# Patient Record
Sex: Male | Born: 1945
Health system: Southern US, Community
[De-identification: ages and names within clinical notes are randomized; demographics above are authoritative.]

## PROBLEM LIST (undated history)

## (undated) DIAGNOSIS — J439 Emphysema, unspecified: Secondary | ICD-10-CM

## (undated) DIAGNOSIS — R0683 Snoring: Secondary | ICD-10-CM

## (undated) DIAGNOSIS — J849 Interstitial pulmonary disease, unspecified: Secondary | ICD-10-CM

## (undated) DIAGNOSIS — E785 Hyperlipidemia, unspecified: Secondary | ICD-10-CM

## (undated) DIAGNOSIS — B269 Mumps without complication: Secondary | ICD-10-CM

## (undated) DIAGNOSIS — K635 Polyp of colon: Secondary | ICD-10-CM

## (undated) DIAGNOSIS — B019 Varicella without complication: Secondary | ICD-10-CM

## (undated) DIAGNOSIS — T7840XA Allergy, unspecified, initial encounter: Secondary | ICD-10-CM

## (undated) DIAGNOSIS — G4733 Obstructive sleep apnea (adult) (pediatric): Secondary | ICD-10-CM

## (undated) DIAGNOSIS — H269 Unspecified cataract: Secondary | ICD-10-CM

## (undated) DIAGNOSIS — R252 Cramp and spasm: Secondary | ICD-10-CM

## (undated) DIAGNOSIS — I10 Essential (primary) hypertension: Secondary | ICD-10-CM

## (undated) DIAGNOSIS — B059 Measles without complication: Secondary | ICD-10-CM

## (undated) DIAGNOSIS — F419 Anxiety disorder, unspecified: Secondary | ICD-10-CM

## (undated) DIAGNOSIS — J189 Pneumonia, unspecified organism: Secondary | ICD-10-CM

## (undated) DIAGNOSIS — J8410 Pulmonary fibrosis, unspecified: Secondary | ICD-10-CM

## (undated) DIAGNOSIS — K2270 Barrett's esophagus without dysplasia: Secondary | ICD-10-CM

## (undated) DIAGNOSIS — K5790 Diverticulosis of intestine, part unspecified, without perforation or abscess without bleeding: Secondary | ICD-10-CM

## (undated) DIAGNOSIS — K649 Unspecified hemorrhoids: Secondary | ICD-10-CM

## (undated) DIAGNOSIS — M1990 Unspecified osteoarthritis, unspecified site: Secondary | ICD-10-CM

## (undated) DIAGNOSIS — G4700 Insomnia, unspecified: Secondary | ICD-10-CM

## (undated) DIAGNOSIS — K219 Gastro-esophageal reflux disease without esophagitis: Secondary | ICD-10-CM

## (undated) DIAGNOSIS — G4730 Sleep apnea, unspecified: Secondary | ICD-10-CM

## (undated) DIAGNOSIS — J310 Chronic rhinitis: Secondary | ICD-10-CM

## (undated) HISTORY — DX: Essential (primary) hypertension: I10

## (undated) HISTORY — DX: Unspecified cataract: H26.9

## (undated) HISTORY — DX: Pneumonia, unspecified organism: J18.9

## (undated) HISTORY — DX: Hyperlipidemia, unspecified: E78.5

## (undated) HISTORY — DX: Gastro-esophageal reflux disease without esophagitis: K21.9

## (undated) HISTORY — DX: Diverticulosis of intestine, part unspecified, without perforation or abscess without bleeding: K57.90

## (undated) HISTORY — PX: JOINT REPLACEMENT: SHX530

## (undated) HISTORY — PX: TONSILLECTOMY: SUR1361

## (undated) HISTORY — DX: Anxiety disorder, unspecified: F41.9

## (undated) HISTORY — DX: Chronic rhinitis: J31.0

## (undated) HISTORY — DX: Pulmonary fibrosis, unspecified: J84.10

## (undated) HISTORY — DX: Unspecified osteoarthritis, unspecified site: M19.90

## (undated) HISTORY — DX: Sleep apnea, unspecified: G47.30

## (undated) HISTORY — DX: Mumps without complication: B26.9

## (undated) HISTORY — PX: COLONOSCOPY: SHX174

## (undated) HISTORY — DX: Barrett's esophagus without dysplasia: K22.70

## (undated) HISTORY — DX: Measles without complication: B05.9

## (undated) HISTORY — DX: Polyp of colon: K63.5

## (undated) HISTORY — DX: Obstructive sleep apnea (adult) (pediatric): G47.33

## (undated) HISTORY — DX: Varicella without complication: B01.9

## (undated) HISTORY — PX: CATARACT EXTRACTION: SUR2

## (undated) HISTORY — DX: Snoring: R06.83

## (undated) HISTORY — DX: Allergy, unspecified, initial encounter: T78.40XA

## (undated) HISTORY — DX: Cramp and spasm: R25.2

## (undated) HISTORY — PX: UPPER GASTROINTESTINAL ENDOSCOPY: SHX188

## (undated) HISTORY — DX: Unspecified hemorrhoids: K64.9

---
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
## 2000-09-28 ENCOUNTER — Emergency Department (HOSPITAL_COMMUNITY): Admission: EM | Admit: 2000-09-28 | Discharge: 2000-09-28 | Payer: Self-pay | Admitting: Emergency Medicine

## 2004-08-28 ENCOUNTER — Ambulatory Visit: Payer: Self-pay | Admitting: Gastroenterology

## 2004-09-12 ENCOUNTER — Ambulatory Visit: Payer: Self-pay | Admitting: Gastroenterology

## 2005-05-30 ENCOUNTER — Ambulatory Visit: Payer: Self-pay | Admitting: Cardiology

## 2005-06-17 ENCOUNTER — Ambulatory Visit: Payer: Self-pay

## 2005-06-17 ENCOUNTER — Ambulatory Visit: Payer: Self-pay | Admitting: Cardiology

## 2005-06-24 ENCOUNTER — Ambulatory Visit: Payer: Self-pay | Admitting: Cardiology

## 2005-08-28 ENCOUNTER — Ambulatory Visit: Payer: Self-pay | Admitting: Cardiology

## 2009-08-15 ENCOUNTER — Encounter (INDEPENDENT_AMBULATORY_CARE_PROVIDER_SITE_OTHER): Payer: Self-pay | Admitting: *Deleted

## 2010-04-17 ENCOUNTER — Telehealth: Payer: Self-pay | Admitting: Gastroenterology

## 2010-10-03 NOTE — Progress Notes (Signed)
Summary: Schedule Colonoscopy   Phone Note Outgoing Call Call back at Home Phone [phone]   Call placed by: Harlow Mares CMA [facility]),  April 17, 2010 11:44 AM Call placed to: Patients wife Summary of Call: spoke to the patients wife she states that the patient is sick now and is also working out of town and she would like me to mail her the recall letter again and they would call back to schedule the colonoscopy when he got back in town.  Initial call taken by: Harlow Mares CMA [facility]),  April 17, 2010 11:44 AM

## 2011-09-07 ENCOUNTER — Ambulatory Visit (INDEPENDENT_AMBULATORY_CARE_PROVIDER_SITE_OTHER): Payer: BC Managed Care – PPO

## 2011-09-07 DIAGNOSIS — I10 Essential (primary) hypertension: Secondary | ICD-10-CM

## 2011-09-07 DIAGNOSIS — E785 Hyperlipidemia, unspecified: Secondary | ICD-10-CM

## 2011-10-28 ENCOUNTER — Ambulatory Visit (INDEPENDENT_AMBULATORY_CARE_PROVIDER_SITE_OTHER): Payer: BC Managed Care – PPO | Admitting: Family Medicine

## 2011-10-28 VITALS — BP 140/82 | HR 88 | Temp 98.3°F | Resp 16 | Ht 71.5 in | Wt 277.8 lb

## 2011-10-28 DIAGNOSIS — J40 Bronchitis, not specified as acute or chronic: Secondary | ICD-10-CM

## 2011-10-28 DIAGNOSIS — H109 Unspecified conjunctivitis: Secondary | ICD-10-CM

## 2011-10-28 MED ORDER — HYDROCODONE-HOMATROPINE 5-1.5 MG/5ML PO SYRP: 5.0000 mL | ORAL_SOLUTION | Freq: Four times a day (QID) | ORAL | Status: AC | PRN

## 2011-10-28 MED ORDER — TOBRAMYCIN 0.3 % OP SOLN: 1.0000 [drp] | Freq: Four times a day (QID) | OPHTHALMIC | Status: AC

## 2011-10-28 MED ORDER — LEVOFLOXACIN 500 MG PO TABS: 500.0000 mg | ORAL_TABLET | Freq: Every day | ORAL | Status: AC

## 2011-10-28 NOTE — Progress Notes (Signed)
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 Of greater concern to him is the fact that his eyes become red and matted over the last week. He's had chills this morning and he generally feels horrible with mild myalgias.  Objective: Patient appears chronically ill, overweight, bloodshot eyes  Eye exam: Normal fundi injected sclera, swollen lids at margins.  HEENT: Slightly bulging TMs, red pharynx, swollen nasal passages  Chest: Bilateral expiratory wheezes faint  Checked her heart: Regular no murmur  Assessment: Significant bronchitis with conjunctivitis, acute illness which is getting worse  Plan: Tobrex, Levaquin, Hydromet... consider bronchodilator if symptoms do not improve

## 2012-01-05 ENCOUNTER — Ambulatory Visit (INDEPENDENT_AMBULATORY_CARE_PROVIDER_SITE_OTHER): Payer: BC Managed Care – PPO | Admitting: Family Medicine

## 2012-01-05 VITALS — BP 131/77 | HR 83 | Temp 98.5°F | Resp 16 | Ht 71.25 in | Wt 278.8 lb

## 2012-01-05 DIAGNOSIS — R6889 Other general symptoms and signs: Secondary | ICD-10-CM

## 2012-01-05 DIAGNOSIS — J111 Influenza due to unidentified influenza virus with other respiratory manifestations: Secondary | ICD-10-CM

## 2012-01-05 DIAGNOSIS — J329 Chronic sinusitis, unspecified: Secondary | ICD-10-CM

## 2012-01-05 DIAGNOSIS — J310 Chronic rhinitis: Secondary | ICD-10-CM

## 2012-01-05 LAB — POCT INFLUENZA A/B
Influenza A, POC: NEGATIVE
Influenza B, POC: NEGATIVE

## 2012-01-05 MED ORDER — FLUTICASONE PROPIONATE 50 MCG/ACT NA SUSP: 2.0000 | Freq: Every day | NASAL | Status: DC

## 2012-01-05 MED ORDER — AZITHROMYCIN 250 MG PO TABS: ORAL_TABLET | ORAL | Status: AC

## 2012-01-05 NOTE — Progress Notes (Signed)
[facility]:  Office Visit  Chief Complaint:  Chief Complaint  Patient presents with  . Fatigue    2 days  . Cough  . Sinusitis  . Headache    HPI: Gregg Smith is a 66 y.o. male who complains of  2 day history of msk pain/aches, feels like "truck ran over him". Sinus congestion, headaches, cough (clear). Has not tried anything OTC except Tylenol. Denies allergies and asthma. Denies  fevers, chills,  Past Medical History  Diagnosis Date  . Hypertension   . Hyperlipidemia   . Arthritis    Past Surgical History  Procedure Date  . Appendectomy    History   Social History  . Marital Status: Married    Spouse Name: N/A    Number of Children: N/A  . Years of Education: N/A   Social History Main Topics  . Smoking status: Never Smoker   . Smokeless tobacco: None  . Alcohol Use: Yes     2 beers weekly  . Drug Use: No  . Sexually Active: Yes   Other Topics Concern  . None   Social History Narrative  . None   Family History  Problem Relation Age of Onset  . Alzheimer's disease Mother   . Cancer Father   . Aortic aneurysm Son    Allergies  Allergen Reactions  . Penicillins Hives and Rash   Prior to Admission medications   Medication Sig Start Date End Date Taking? Authorizing Provider  lisinopril (PRINIVIL,ZESTRIL) 5 MG tablet Take 5 mg by mouth daily.   Yes Historical Provider, MD  rosuvastatin (CRESTOR) 10 MG tablet Take 10 mg by mouth daily.   Yes Historical Provider, MD     ROS: The patient denies night sweats, unintentional weight loss, chest pain, palpitations, wheezing, dyspnea on exertion, nausea, vomiting, abdominal pain, dysuria, hematuria, melena, numbness, weakness, or tingling.   All other systems have been reviewed and were otherwise negative with the exception of those mentioned in the HPI and as above.    PHYSICAL EXAM: Filed Vitals:   01/05/12 0947  BP: 131/77  Pulse: 83  Temp: 98.5 F (36.9 C)  Resp: 16    Filed Vitals:   01/05/12 0947  Height: 5' 11.25" (1.81 m)  Weight: 278 lb 12.8 oz (126.463 kg)   Body mass index is 38.61 kg/(m^2).  General: Alert, no acute distress, obese, sweating HEENT:  Normocephalic, atraumatic, oropharynx patent. TM NL, OP CLEAR, throat red but no tonsills, no sinus tenderness Cardiovascular:  Regular rate and rhythm, no rubs murmurs or gallops.  No Carotid bruits, radial pulse intact. No pedal edema.  Respiratory: Clear to auscultation bilaterally.  No wheezes, rales, or rhonchi.  No cyanosis, no use of accessory musculature GI: No organomegaly, abdomen is soft and non-tender, positive bowel sounds.  No masses. Skin: No rashes. Neurologic: Facial musculature symmetric. Psychiatric: Patient is appropriate throughout our interaction. Lymphatic: No cervical lymphadenopathy Musculoskeletal: Gait intact.   LABS: Results for orders placed in visit on 01/05/12  POCT INFLUENZA A/B      Component Value Range   Influenza A, POC Negative     Influenza B, POC Negative       EKG/XRAY:   Primary read interpreted by Dr. Conley Rolls at [facility].   ASSESSMENT/PLAN: Encounter Diagnoses  Name Primary?  . Flu-like symptoms Yes  . Sinusitis    Most likely viral URI, OTC meds for sxs management Sxs treatment  Gave pt a rx for Azithromycin if no  improvement then may take      [name] PHUONG, DO 01/05/2012 11:01 AM

## 2012-04-14 ENCOUNTER — Ambulatory Visit (INDEPENDENT_AMBULATORY_CARE_PROVIDER_SITE_OTHER): Payer: BC Managed Care – PPO | Admitting: Physician Assistant

## 2012-04-14 ENCOUNTER — Encounter: Payer: Self-pay | Admitting: Physician Assistant

## 2012-04-14 VITALS — BP 134/80 | HR 75 | Temp 98.0°F | Resp 18 | Ht 71.5 in | Wt 275.0 lb

## 2012-04-14 DIAGNOSIS — I10 Essential (primary) hypertension: Secondary | ICD-10-CM

## 2012-04-14 DIAGNOSIS — E785 Hyperlipidemia, unspecified: Secondary | ICD-10-CM

## 2012-04-14 DIAGNOSIS — J310 Chronic rhinitis: Secondary | ICD-10-CM

## 2012-04-14 DIAGNOSIS — R5381 Other malaise: Secondary | ICD-10-CM

## 2012-04-14 DIAGNOSIS — Z23 Encounter for immunization: Secondary | ICD-10-CM

## 2012-04-14 DIAGNOSIS — Z1211 Encounter for screening for malignant neoplasm of colon: Secondary | ICD-10-CM

## 2012-04-14 DIAGNOSIS — R5383 Other fatigue: Secondary | ICD-10-CM

## 2012-04-14 DIAGNOSIS — Z0000 Encounter for general adult medical examination without abnormal findings: Secondary | ICD-10-CM

## 2012-04-14 DIAGNOSIS — Z125 Encounter for screening for malignant neoplasm of prostate: Secondary | ICD-10-CM

## 2012-04-14 LAB — CBC WITH DIFFERENTIAL/PLATELET
Eosinophils Absolute: 0.2 10*3/uL (ref 0.0–0.7)
Eosinophils Relative: 3 % (ref 0–5)
Hemoglobin: 15.5 g/dL (ref 13.0–17.0)
Lymphs Abs: 1.4 10*3/uL (ref 0.7–4.0)
MCH: 28.8 pg (ref 26.0–34.0)
MCV: 85.7 fL (ref 78.0–100.0)
Monocytes Relative: 13 % — ABNORMAL HIGH (ref 3–12)
Platelets: 196 10*3/uL (ref 150–400)
RBC: 5.39 MIL/uL (ref 4.22–5.81)

## 2012-04-14 LAB — POCT URINALYSIS DIPSTICK
Blood, UA: NEGATIVE
Nitrite, UA: NEGATIVE
pH, UA: 8.5

## 2012-04-14 LAB — COMPREHENSIVE METABOLIC PANEL
CO2: 28 mEq/L (ref 19–32)
Creat: 0.94 mg/dL (ref 0.50–1.35)
Glucose, Bld: 83 mg/dL (ref 70–99)
Total Bilirubin: 0.6 mg/dL (ref 0.3–1.2)

## 2012-04-14 LAB — PSA: PSA: 1.8 ng/mL (ref <=4.00)

## 2012-04-14 LAB — LIPID PANEL
HDL: 34 mg/dL — ABNORMAL LOW (ref >39)
Total CHOL/HDL Ratio: 4.2 Ratio
Triglycerides: 176 mg/dL — ABNORMAL HIGH (ref <150)

## 2012-04-14 MED ORDER — ROSUVASTATIN CALCIUM 10 MG PO TABS: 10.0000 mg | ORAL_TABLET | Freq: Every day | ORAL | Status: DC

## 2012-04-14 MED ORDER — FLUTICASONE PROPIONATE 50 MCG/ACT NA SUSP: 2.0000 | Freq: Every day | NASAL | Status: DC

## 2012-04-14 MED ORDER — LISINOPRIL 5 MG PO TABS: 5.0000 mg | ORAL_TABLET | Freq: Every day | ORAL | Status: DC

## 2012-04-14 NOTE — Progress Notes (Signed)
Subjective:    Patient ID: Gregg Smith, male    DOB: 06-Jan-1946, 66 y.o.   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 Only c/o is of fatigue more than usual.  He has a very unusual sleep pattern.  It has been a long while since he has had a colonoscopy. And he does think there has been some "intestinal" forms of cancer in his family.  EKG: 2011 Review of Systems  All other systems reviewed and are negative.       Objective:   Physical Exam  Nursing note and vitals reviewed. Constitutional: He is oriented to person, place, and time. He appears well-developed and well-nourished.  HENT:  Head: Normocephalic and atraumatic.  Right Ear: External ear normal.  Left Ear: External ear normal.  Nose: Nose normal.  Mouth/Throat: Oropharynx is clear and moist. No oropharyngeal exudate.  Eyes: Conjunctivae and EOM are normal. Pupils are equal, round, and reactive to light.  Neck: Normal range of motion. Neck supple. No thyromegaly present.  Cardiovascular: Normal rate, regular rhythm, normal heart sounds and intact distal pulses.        No carotid bruits  Pulmonary/Chest: Effort normal and breath sounds normal.  Abdominal: Soft. Bowel sounds are normal. He exhibits no distension. There is no tenderness.       Obese abdomen  Genitourinary: Prostate normal and penis normal.  Musculoskeletal: Normal range of motion.  Lymphadenopathy:    He has no cervical adenopathy.  Neurological: He is alert and oriented to person, place, and time.  Skin: Skin is warm and dry.  Psychiatric: He has a normal mood and affect. His behavior is normal. Judgment and thought content normal.   Results for orders placed in visit on 04/14/12  LIPID PANEL      Component Value Range   Cholesterol 143  0 - 200 mg/dL   Triglycerides 295 (*) <150 mg/dL   HDL 34 (*) >62 mg/dL   Total CHOL/HDL Ratio 4.2     VLDL 35  0 - 40 mg/dL   LDL Cholesterol 74  0 - 99 mg/dL  COMPREHENSIVE METABOLIC PANEL   Component Value Range   Sodium 141  135 - 145 mEq/L   Potassium 4.4  3.5 - 5.3 mEq/L   Chloride 107  96 - 112 mEq/L   CO2 28  19 - 32 mEq/L   Glucose, Bld 83  70 - 99 mg/dL   BUN 16  6 - 23 mg/dL   Creat 1.30  8.65 - 7.84 mg/dL   Total Bilirubin 0.6  0.3 - 1.2 mg/dL   Alkaline Phosphatase 54  39 - 117 U/L   AST 26  0 - 37 U/L   ALT 18  0 - 53 U/L   Total Protein 7.0  6.0 - 8.3 g/dL   Albumin 4.0  3.5 - 5.2 g/dL   Calcium 9.3  8.4 - 69.6 mg/dL  PSA      Component Value Range   PSA 1.80  <=4.00 ng/mL  POCT URINALYSIS DIPSTICK      Component Value Range   Color, UA yellow     Clarity, UA clear     Glucose, UA neg     Bilirubin, UA small     Ketones, UA neg     Spec Grav, UA 1.015     Blood, UA neg     pH, UA 8.5     Protein, UA trace     Urobilinogen, UA 0.2  Nitrite, UA neg     Leukocytes, UA Negative    TSH      Component Value Range   TSH 0.841  0.350 - 4.500 uIU/mL  CBC WITH DIFFERENTIAL      Component Value Range   WBC 6.8  4.0 - 10.5 K/uL   RBC 5.39  4.22 - 5.81 MIL/uL   Hemoglobin 15.5  13.0 - 17.0 g/dL   HCT 16.1  09.6 - 04.5 %   MCV 85.7  78.0 - 100.0 fL   MCH 28.8  26.0 - 34.0 pg   MCHC 33.5  30.0 - 36.0 g/dL   RDW 40.9  81.1 - 91.4 %   Platelets 196  150 - 400 K/uL   Neutrophils Relative 63  43 - 77 %   Neutro Abs 4.3  1.7 - 7.7 K/uL   Lymphocytes Relative 20  12 - 46 %   Lymphs Abs 1.4  0.7 - 4.0 K/uL   Monocytes Relative 13 (*) 3 - 12 %   Monocytes Absolute 0.9  0.1 - 1.0 K/uL   Eosinophils Relative 3  0 - 5 %   Eosinophils Absolute 0.2  0.0 - 0.7 K/uL   Basophils Relative 1  0 - 1 %   Basophils Absolute 0.0  0.0 - 0.1 K/uL   Smear Review Criteria for review not met    IFOBT (OCCULT BLOOD)      Component Value Range   IFOBT Negative            Assessment & Plan:  CPE- Obesity-encouraged healthy diet and exercise.  Due for colonoscopy-we will set up. Fatigue-Consider sleep study

## 2012-04-17 ENCOUNTER — Encounter: Payer: Self-pay | Admitting: Physician Assistant

## 2012-04-26 ENCOUNTER — Encounter: Payer: Self-pay | Admitting: Internal Medicine

## 2012-05-06 ENCOUNTER — Ambulatory Visit (INDEPENDENT_AMBULATORY_CARE_PROVIDER_SITE_OTHER): Payer: BC Managed Care – PPO | Admitting: Emergency Medicine

## 2012-05-06 VITALS — BP 134/78 | HR 80 | Temp 98.0°F | Resp 17 | Ht 71.5 in | Wt 273.0 lb

## 2012-05-06 DIAGNOSIS — J309 Allergic rhinitis, unspecified: Secondary | ICD-10-CM

## 2012-05-06 DIAGNOSIS — J40 Bronchitis, not specified as acute or chronic: Secondary | ICD-10-CM

## 2012-05-06 MED ORDER — HYDROCOD POLST-CHLORPHEN POLST 10-8 MG/5ML PO LQCR: 5.0000 mL | Freq: Two times a day (BID) | ORAL | Status: DC | PRN

## 2012-05-06 MED ORDER — MOMETASONE FUROATE 50 MCG/ACT NA SUSP: 2.0000 | Freq: Every day | NASAL | Status: DC

## 2012-05-06 MED ORDER — PSEUDOEPHEDRINE-GUAIFENESIN ER 60-600 MG PO TB12: 1.0000 | ORAL_TABLET | Freq: Two times a day (BID) | ORAL | Status: DC

## 2012-05-06 NOTE — Progress Notes (Signed)
  Date:  05/06/2012   Name:  JW COVIN   DOB:  [birth year]   MRN:  [id number] Gender: male Age: 66 y.o.  PCP:  No primary provider on file.    Chief Complaint: Cold Exposure, Facial Pain and Dizziness   History of Present Illness:  Gregg Smith is a 66 y.o. pleasant patient who presents with the following:  Ill all week with nasal congestion, pain in his cheeks and upper jaw.  Cough that is productive mucoid sputum.  No fever or chills.  No shortness of breath, wheezing or orthopnea.  Feels fatigued.  Nasal drainage is clear in character.  No improvement with OTC meds.  History of allergic rhinitis  There is no problem list on file for this patient.   Past Medical History  Diagnosis Date  . Hypertension   . Hyperlipidemia   . Arthritis     Past Surgical History  Procedure Date  . Appendectomy   . Tonsillectomy and adenoidectomy     History  Substance Use Topics  . Smoking status: Never Smoker   . Smokeless tobacco: Not on file  . Alcohol Use: 1.8 oz/week    3 Cans of beer per week     2 beers weekly    Family History  Problem Relation Age of Onset  . Alzheimer's disease Mother   . Cancer Father   . Aortic aneurysm Son     Allergies  Allergen Reactions  . Penicillins Hives and Rash    Medication list has been reviewed and updated.  Current Outpatient Prescriptions on File Prior to Visit  Medication Sig Dispense Refill  . etodolac (LODINE) 400 MG tablet Take 400 mg by mouth 2 (two) times daily.      . fluticasone (FLONASE) 50 MCG/ACT nasal spray Place 2 sprays into the nose daily.  1 g  2  . lisinopril (PRINIVIL,ZESTRIL) 5 MG tablet Take 1 tablet (5 mg total) by mouth daily.  90 tablet  2  . rosuvastatin (CRESTOR) 10 MG tablet Take 1 tablet (10 mg total) by mouth daily.  90 tablet  2    Review of Systems:  As per HPI, otherwise negative.    Physical Examination: Filed Vitals:   05/06/12 1742  BP: 134/78  Pulse: 80  Temp: 98 F (36.7  C)  Resp: 17   Filed Vitals:   05/06/12 1742  Height: 5' 11.5" (1.816 m)  Weight: 273 lb (123.832 kg)   Body mass index is 37.55 kg/(m^2). Ideal Body Weight: Weight in (lb) to have BMI = 25: 181.4   GEN: WDWN, NAD, Non-toxic, A & O x 3 HEENT: Atraumatic, Normocephalic. Neck supple. No masses, No LAD.  Oroparynx negative Ears and Nose: No external deformity.  TM negative bilaterally.   CV: RRR, No M/G/R. No JVD. No thrill. No extra heart sounds. PULM: CTA B, no wheezes, crackles, rhonchi. No retractions. No resp. distress. No accessory muscle use. ABD: S, NT, ND, +BS. No rebound. No HSM. EXTR: No c/c/e NEURO Normal gait.  PSYCH: Normally interactive. Conversant. Not depressed or anxious appearing.  Calm demeanor.    Assessment and Plan: Seasonal allergic rhinitis  nasonex mucinex tussionex  Carmelina Dane, MD

## 2012-08-14 ENCOUNTER — Ambulatory Visit (INDEPENDENT_AMBULATORY_CARE_PROVIDER_SITE_OTHER): Payer: BC Managed Care – PPO | Admitting: Emergency Medicine

## 2012-08-14 VITALS — BP 148/85 | HR 72 | Temp 98.7°F | Resp 18 | Ht 71.0 in | Wt 283.6 lb

## 2012-08-14 DIAGNOSIS — D239 Other benign neoplasm of skin, unspecified: Secondary | ICD-10-CM

## 2012-08-14 DIAGNOSIS — L089 Local infection of the skin and subcutaneous tissue, unspecified: Secondary | ICD-10-CM

## 2012-08-14 DIAGNOSIS — Z23 Encounter for immunization: Secondary | ICD-10-CM

## 2012-08-14 DIAGNOSIS — L03019 Cellulitis of unspecified finger: Secondary | ICD-10-CM

## 2012-08-14 MED ORDER — HYDROCODONE-ACETAMINOPHEN 5-325 MG PO TABS: 1.0000 | ORAL_TABLET | Freq: Four times a day (QID) | ORAL | Status: DC | PRN

## 2012-08-14 MED ORDER — TETANUS-DIPHTH-ACELL PERTUSSIS 5-2.5-18.5 LF-MCG/0.5 IM SUSP
0.5000 mL | Freq: Once | INTRAMUSCULAR | Status: AC
  Administered 2012-08-14: 0.5 mL via INTRAMUSCULAR

## 2012-08-14 MED ORDER — CIPROFLOXACIN HCL 500 MG PO TABS: 500.0000 mg | ORAL_TABLET | Freq: Two times a day (BID) | ORAL | Status: DC

## 2012-08-14 NOTE — Patient Instructions (Signed)
Paronychia  Paronychia is an inflammatory reaction involving the folds of the skin surrounding the fingernail. This is commonly caused by an infection in the skin around a nail. The most common cause of paronychia is frequent wetting of the hands (as seen with bartenders, food servers, nurses or others who wet their hands). This makes the skin around the fingernail susceptible to infection by bacteria (germs) or fungus. Other predisposing factors are:   Aggressive manicuring.   Nail biting.   Thumb sucking.  The most common cause is a staphylococcal (a type of germ) infection, or a fungal (Candida) infection. When caused by a germ, it usually comes on suddenly with redness, swelling, pus and is often painful. It may get under the nail and form an abscess (collection of pus), or form an abscess around the nail. If the nail itself is infected with a fungus, the treatment is usually prolonged and may require oral medicine for up to one year. Your caregiver will determine the length of time treatment is required. The paronychia caused by bacteria (germs) may largely be avoided by not pulling on hangnails or picking at cuticles. When the infection occurs at the tips of the finger it is called felon. When the cause of paronychia is from the herpes simplex virus (HSV) it is called herpetic whitlow.  TREATMENT   When an abscess is present treatment is often incision and drainage. This means that the abscess must be cut open so the pus can get out. When this is done, the following home care instructions should be followed.  HOME CARE INSTRUCTIONS    It is important to keep the affected fingers very dry. Rubber or plastic gloves over cotton gloves should be used whenever the hand must be placed in water.   Keep wound clean, dry and dressed as suggested by your caregiver between warm soaks or warm compresses.   Soak in warm water for fifteen to twenty minutes three to four times per day for bacterial infections. Fungal  infections are very difficult to treat, so often require treatment for long periods of time.   For bacterial (germ) infections take antibiotics (medicine which kill germs) as directed and finish the prescription, even if the problem appears to be solved before the medicine is gone.   Only take over-the-counter or prescription medicines for pain, discomfort, or fever as directed by your caregiver.  SEEK IMMEDIATE MEDICAL CARE IF:   You have redness, swelling, or increasing pain in the wound.   You notice pus coming from the wound.   You have a fever.   You notice a bad smell coming from the wound or dressing.  Document Released: 02/11/2001 Document Revised: 11/10/2011 Document Reviewed: 10/13/2008  Exit[employer] Patient Information 2013 [employer].

## 2012-08-14 NOTE — Progress Notes (Signed)
Procedure Note: Verbal consent obtained from the patient.  Local anesthesia was difficult to obtain.  Initial digital block with 2cc 2% plain lidocaine.  Subsequent digital block with another 2cc 2% plain lidocaine.  A third more distal block with an additional 2cc 2% plain lidocaine was added during the procedure.  Betadine prep.  Sterile technique employed.  Right thumb nail removed in total without difficulty.  Moderate amount of purulence drained from underneath the nail.  Culture sent.  Proximal aspect of the nail bed explored revealing no nail remnants.  No foreign body was visualized.  Xeroform applied.  Cleansed and dressed.

## 2012-08-14 NOTE — Progress Notes (Signed)
  Subjective:    Patient ID: Gregg Smith, male    DOB: [birth year], 66 y.o.   MRN: [id number]  HPIright thumb pain. Digging sweet potatoes three weeks ago. Pain on side of nail. Increasing pain.Did not feel anything puncture skin. Digging with metal shovel. ? Thorn from garden.Getting sorer and sorer.    Review of Systems     Objective:   Physical Exam examination of the right thumb reveals purulence present underneath the nail proximally. There is what appears to be a linear vertical foreign body which extends from the top of the thumb  beneath the nail and extends to the base of the nail        Assessment & Plan:  There is what appears be a foreign body beneath the nail with developing secondary infection the infection has a greenish blue color raising the question of Pseudomonas. I will treat with Cipro 500 twice a day pending culture results . After the nail was removed the nail bed looks clean. A good amount of purulent material was removed. Continue Cipro until cultures back

## 2012-08-16 ENCOUNTER — Ambulatory Visit (INDEPENDENT_AMBULATORY_CARE_PROVIDER_SITE_OTHER): Payer: BC Managed Care – PPO | Admitting: Internal Medicine

## 2012-08-16 ENCOUNTER — Encounter: Payer: Self-pay | Admitting: Internal Medicine

## 2012-08-16 VITALS — BP 154/74 | HR 71 | Temp 97.9°F | Resp 16 | Ht 72.5 in | Wt 286.6 lb

## 2012-08-16 DIAGNOSIS — L03019 Cellulitis of unspecified finger: Secondary | ICD-10-CM

## 2012-08-16 DIAGNOSIS — M79644 Pain in right finger(s): Secondary | ICD-10-CM

## 2012-08-16 DIAGNOSIS — M79609 Pain in unspecified limb: Secondary | ICD-10-CM

## 2012-08-16 DIAGNOSIS — L03011 Cellulitis of right finger: Secondary | ICD-10-CM

## 2012-08-16 LAB — WOUND CULTURE: Gram Stain: NONE SEEN

## 2012-08-16 NOTE — Progress Notes (Signed)
  Subjective:    Patient ID: Gregg Smith, male    DOB: 17-Jul-1946, 66 y.o.   MRN: [id number]  HPI Small amount of drainage and swelling to the right thumb. Had his nail removed for a paronychia on dec 14th. On ciprofloxacin bid. Soaking 4 times a day. Pain is 5/10 in severity non radiating localized at the cuticle constant.   Review of Systems  Constitutional: Negative.   Musculoskeletal:       Thumb swelling pain at cuticle  All other systems reviewed and are negative.       Objective:   Physical Exam  Nursing note and vitals reviewed. Constitutional: He is oriented to person, place, and time. He appears well-developed and well-nourished.  HENT:  Head: Normocephalic and atraumatic.  Eyes: Conjunctivae normal and EOM are normal. Pupils are equal, round, and reactive to light.  Neck: Normal range of motion. Neck supple.  Cardiovascular: Normal rate and regular rhythm.   Pulmonary/Chest: Effort normal.  Musculoskeletal: He exhibits tenderness.       Tenderness swelling to the right thumb. abscess at the medial aspect of the cuticle where the nail had been removed. Decrease range of motion at the ip joint of the thumb.  Neurological: He is alert and oriented to person, place, and time.  Skin: There is erythema.       abscess at cuticle described above  Psychiatric: He has a normal mood and affect. His behavior is normal. Judgment and thought content normal.     Procedure note: cuticle cleaned with alcohol. 20 gauge needle used to de roof the abscess at the cutilce line of the thumb. Minimal purulent drainage expressed. Dressed with non adhesive bandage .    Assessment & Plan:

## 2012-08-16 NOTE — Patient Instructions (Signed)
Soak 4 times dailu in warm soapy water. Apply neosporin to wound and return for any sign of infection.

## 2013-01-15 ENCOUNTER — Ambulatory Visit (INDEPENDENT_AMBULATORY_CARE_PROVIDER_SITE_OTHER): Payer: BC Managed Care – PPO | Admitting: Emergency Medicine

## 2013-01-15 VITALS — BP 144/84 | HR 82 | Temp 99.2°F | Resp 17 | Ht 71.5 in | Wt 285.0 lb

## 2013-01-15 DIAGNOSIS — J018 Other acute sinusitis: Secondary | ICD-10-CM

## 2013-01-15 DIAGNOSIS — J40 Bronchitis, not specified as acute or chronic: Secondary | ICD-10-CM

## 2013-01-15 DIAGNOSIS — J329 Chronic sinusitis, unspecified: Secondary | ICD-10-CM

## 2013-01-15 DIAGNOSIS — J209 Acute bronchitis, unspecified: Secondary | ICD-10-CM

## 2013-01-15 MED ORDER — PSEUDOEPHEDRINE-GUAIFENESIN ER 60-600 MG PO TB12: 1.0000 | ORAL_TABLET | Freq: Two times a day (BID) | ORAL | Status: DC

## 2013-01-15 MED ORDER — LEVOFLOXACIN 500 MG PO TABS: 500.0000 mg | ORAL_TABLET | Freq: Every day | ORAL | Status: AC

## 2013-01-15 NOTE — Progress Notes (Signed)
[facility] [address], [place] [address] [phone]  Date:  01/15/2013   Name:  Gregg Smith   DOB:  02-02-1946   MRN:  [id number]  PCP:  No primary provider on file.    Chief Complaint: Sinusitis, URI, Fatigue and Sore Throat   History of Present Illness:  Gregg Smith is a 67 y.o. very pleasant male patient who presents with the following:  Ill this week with fatigue, frontal headache, nasal congestion and drainage.  Post nasal drip.  No fever or chills.  No nausea or vomiting.  No wheezing or shortness of breath.  No stool change.  No rash.  Pressure in ears.  No sick contacts.  No improvement with over the counter medications or other home remedies. Denies other complaint or health concern today.   There are no active problems to display for this patient.   Past Medical History  Diagnosis Date  . Hypertension   . Hyperlipidemia   . Arthritis   . Allergy     Past Surgical History  Procedure Laterality Date  . Appendectomy    . Tonsillectomy and adenoidectomy      History  Substance Use Topics  . Smoking status: Former Games developer  . Smokeless tobacco: Former Neurosurgeon    Quit date: 11/19/2011  . Alcohol Use: 1.8 oz/week    3 Cans of beer per week     Comment: 2 beers weekly    Family History  Problem Relation Age of Onset  . Alzheimer's disease Mother   . Cancer Father   . Aortic aneurysm Son   . Heart disease Son   . Cancer Maternal Grandmother   . Heart disease Maternal Grandfather   . Cancer Paternal Grandmother   . Cancer Paternal Grandfather     Allergies  Allergen Reactions  . Penicillins Hives and Rash    Medication list has been reviewed and updated.  Current Outpatient Prescriptions on File Prior to Visit  Medication Sig Dispense Refill  . lisinopril (PRINIVIL,ZESTRIL) 5 MG tablet Take 1 tablet (5 mg total) by mouth daily.  90 tablet  2  . rosuvastatin (CRESTOR) 10 MG tablet Take 1 tablet (10 mg total) by  mouth daily.  90 tablet  2   No current facility-administered medications on file prior to visit.    Review of Systems:  As per HPI, otherwise negative.    Physical Examination: Filed Vitals:   01/15/13 1741  BP: 144/84  Pulse: 82  Temp: 99.2 F (37.3 C)  Resp: 17   Filed Vitals:   01/15/13 1741  Height: 5' 11.5" (1.816 m)  Weight: 285 lb (129.275 kg)   Body mass index is 39.2 kg/(m^2). Ideal Body Weight: Weight in (lb) to have BMI = 25: 181.4  GEN: WDWN, NAD, Non-toxic, A & O x 3 HEENT: Atraumatic, Normocephalic. Neck supple. No masses, No LAD. Ears and Nose: No external deformity.  Purulent nasal drainage CV: RRR, No M/G/R. No JVD. No thrill. No extra heart sounds. PULM: CTA B, no wheezes, crackles, rhonchi. No retractions. No resp. distress. No accessory muscle use. ABD: S, NT, ND, +BS. No rebound. No HSM. EXTR: No c/c/e NEURO Normal gait.  PSYCH: Normally interactive. Conversant. Not depressed or anxious appearing.  Calm demeanor.    Assessment and Plan: Sinusitis augmentin mucinex Follow up as needed   Signed,  Phillips Odor, MD

## 2013-01-15 NOTE — Patient Instructions (Signed)

## 2013-02-11 ENCOUNTER — Ambulatory Visit (INDEPENDENT_AMBULATORY_CARE_PROVIDER_SITE_OTHER): Payer: BC Managed Care – PPO | Admitting: Emergency Medicine

## 2013-02-11 ENCOUNTER — Ambulatory Visit: Payer: BC Managed Care – PPO

## 2013-02-11 VITALS — BP 144/79 | HR 85 | Temp 99.0°F | Resp 17 | Ht 71.5 in | Wt 278.0 lb

## 2013-02-11 DIAGNOSIS — E785 Hyperlipidemia, unspecified: Secondary | ICD-10-CM | POA: Insufficient documentation

## 2013-02-11 DIAGNOSIS — J029 Acute pharyngitis, unspecified: Secondary | ICD-10-CM

## 2013-02-11 DIAGNOSIS — J189 Pneumonia, unspecified organism: Secondary | ICD-10-CM

## 2013-02-11 DIAGNOSIS — M1990 Unspecified osteoarthritis, unspecified site: Secondary | ICD-10-CM | POA: Insufficient documentation

## 2013-02-11 DIAGNOSIS — R05 Cough: Secondary | ICD-10-CM

## 2013-02-11 DIAGNOSIS — R059 Cough, unspecified: Secondary | ICD-10-CM

## 2013-02-11 DIAGNOSIS — I10 Essential (primary) hypertension: Secondary | ICD-10-CM

## 2013-02-11 DIAGNOSIS — B9789 Other viral agents as the cause of diseases classified elsewhere: Secondary | ICD-10-CM

## 2013-02-11 LAB — POCT CBC
Granulocyte percent: 82.3 %G — AB (ref 37–80)
HCT, POC: 49.4 % (ref 43.5–53.7)
Lymph, poc: 1.2 (ref 0.6–3.4)
MCHC: 32.4 g/dL (ref 31.8–35.4)
MCV: 90.4 fL (ref 80–97)
POC LYMPH PERCENT: 10.7 %L (ref 10–50)
RDW, POC: 15.3 %

## 2013-02-11 MED ORDER — LEVOFLOXACIN 500 MG PO TABS: 500.0000 mg | ORAL_TABLET | Freq: Every day | ORAL | Status: DC

## 2013-02-11 MED ORDER — LEVOFLOXACIN 500 MG PO TABS: 500.0000 mg | ORAL_TABLET | Freq: Every day | ORAL | Status: AC

## 2013-02-11 NOTE — Progress Notes (Signed)
  Subjective:    Patient ID: Gregg Smith, male    DOB: [birth year], 67 y.o.   MRN: [id number]  HPI Pt presents with sore throat, cough with phlegm, thinks its starting to turn green. Says his sinuses are bothering him. He doesn't know if he's had fever. It started yesterday, he did work yesterday, but by late evening he was feeling pretty rough. Very lethargic, congested. He is not a smoker.    Review of Systems     Objective:   Physical Exam HEENT exam reveals an ill-appearing but nontoxic male who is in no acute respiratory distress. His neck is supple. His chest is rhonchi without rales. Cardiac exam is a regular rate without murmurs. Skin was warm and dry  [facility] reading (PRIMARY) by  Dr. Cleta Alberts no pneumonia seen  Results for orders placed in visit on 02/11/13  POCT CBC      Result Value Range   WBC 10.9 (*) 4.6 - 10.2 K/uL   Lymph, poc 1.2  0.6 - 3.4   POC LYMPH PERCENT 10.7  10 - 50 %L   MID (cbc) 0.8  0 - 0.9   POC MID % 7.0  0 - 12 %M   POC Granulocyte 9.0 (*) 2 - 6.9   Granulocyte percent 82.3 (*) 37 - 80 %G   RBC 5.46  4.69 - 6.13 M/uL   Hemoglobin 16.0  14.1 - 18.1 g/dL   HCT, POC 40.9  81.1 - 53.7 %   MCV 90.4  80 - 97 fL   MCH, POC 29.3  27 - 31.2 pg   MCHC 32.4  31.8 - 35.4 g/dL   RDW, POC 91.4     Platelet Count, POC 192  142 - 424 K/uL   MPV 11.5  0 - 99.8 fL  POCT RAPID STREP A (OFFICE)      Result Value Range   Rapid Strep A Screen Negative  Negative        Assessment & Plan:  We'll treat with Mucinex and cover for pneumonia with Levaquin one a day. Recheck 48 hours if not improving.

## 2013-02-11 NOTE — Patient Instructions (Signed)

## 2013-02-13 ENCOUNTER — Telehealth: Payer: Self-pay | Admitting: Emergency Medicine

## 2013-02-13 NOTE — Telephone Encounter (Signed)
Pt is doing better. He was vomiting Friday and Saturday. Today he states that he is feeling better, has more energy. He is still coughing a lot but the cough is more productive. Overall he does feel better. He will check in with Dr. Cleta Alberts if he starts to feel worse.

## 2013-02-13 NOTE — Telephone Encounter (Signed)
Call check on status.

## 2013-02-17 ENCOUNTER — Telehealth: Payer: Self-pay

## 2013-02-17 NOTE — Telephone Encounter (Signed)
Dr. Cleta Alberts   Patient is much better, still has some congestion and coughing up mucus.  Has three more days of meds to take.   Just reporting how he is today.  No need to call. [phone]

## 2013-02-26 ENCOUNTER — Other Ambulatory Visit: Payer: Self-pay | Admitting: Physician Assistant

## 2013-03-01 ENCOUNTER — Ambulatory Visit (INDEPENDENT_AMBULATORY_CARE_PROVIDER_SITE_OTHER): Payer: BC Managed Care – PPO | Admitting: Emergency Medicine

## 2013-03-01 VITALS — BP 151/85 | HR 66 | Temp 98.0°F | Resp 16 | Ht 72.0 in | Wt 280.0 lb

## 2013-03-01 DIAGNOSIS — J40 Bronchitis, not specified as acute or chronic: Secondary | ICD-10-CM

## 2013-03-01 DIAGNOSIS — J309 Allergic rhinitis, unspecified: Secondary | ICD-10-CM

## 2013-03-01 MED ORDER — PSEUDOEPHEDRINE-GUAIFENESIN ER 60-600 MG PO TB12: 1.0000 | ORAL_TABLET | Freq: Two times a day (BID) | ORAL | Status: DC

## 2013-03-01 MED ORDER — FLUTICASONE PROPIONATE 50 MCG/ACT NA SUSP: 2.0000 | Freq: Every day | NASAL | Status: DC

## 2013-03-01 NOTE — Patient Instructions (Addendum)
Allergic Rhinitis Allergic rhinitis is when the mucous membranes in the nose respond to allergens. Allergens are particles in the air that cause your body to have an allergic reaction. This causes you to release allergic antibodies. Through a chain of events, these eventually cause you to release histamine into the blood stream (hence the use of antihistamines). Although meant to be protective to the body, it is this release that causes your discomfort, such as frequent sneezing, congestion and an itchy runny nose.  CAUSES  The pollen allergens may come from grasses, trees, and weeds. This is seasonal allergic rhinitis, or "hay fever." Other allergens cause year-round allergic rhinitis (perennial allergic rhinitis) such as house dust mite allergen, pet dander and mold spores.  SYMPTOMS   Nasal stuffiness (congestion).  Runny, itchy nose with sneezing and tearing of the eyes.  There is often an itching of the mouth, eyes and ears. It cannot be cured, but it can be controlled with medications. DIAGNOSIS  If you are unable to determine the offending allergen, skin or blood testing may find it. TREATMENT   Avoid the allergen.  Medications and allergy shots (immunotherapy) can help.  Hay fever may often be treated with antihistamines in pill or nasal spray forms. Antihistamines block the effects of histamine. There are over-the-counter medicines that may help with nasal congestion and swelling around the eyes. Check with your caregiver before taking or giving this medicine. If the treatment above does not work, there are many new medications your caregiver can prescribe. Stronger medications may be used if initial measures are ineffective. Desensitizing injections can be used if medications and avoidance fails. Desensitization is when a patient is given ongoing shots until the body becomes less sensitive to the allergen. Make sure you follow up with your caregiver if problems continue. SEEK MEDICAL  CARE IF:   You develop fever (more than 100.5 F (38.1 C).  You develop a cough that does not stop easily (persistent).  You have shortness of breath.  You start wheezing.  Symptoms interfere with normal daily activities. Document Released: 05/13/2001 Document Revised: 11/10/2011 Document Reviewed: 11/22/2008 Exit[employer] Patient Information 2014 [employer].  

## 2013-03-01 NOTE — Progress Notes (Signed)
[facility] [address], [place] [address] [phone]  Date:  03/01/2013   Name:  Gregg Smith   DOB:  11/29/1945   MRN:  [id number]  PCP:  No primary provider on file.    Chief Complaint: Cough   History of Present Illness:  Gregg Smith is a 67 y.o. very pleasant male patient who presents with the following:  Seen 10 days ago for pneumonia and treated with course of levaquin.  Says he still feels somewhat run down and has a foul taste in his mouth.  Cough is productive scant mucoid (white) sputum.  No fever or chills.  No shortness of breath.  Has occasional wheezing.  No history of asthma or bronchospasm.  Has a clear nasal drainage and post nasal drip.   No improvement with over the counter medications or other home remedies. Denies other complaint or health concern today.   Patient Active Problem List   Diagnosis Date Noted  . Unspecified essential hypertension 02/11/2013  . Other and unspecified hyperlipidemia 02/11/2013  . Arthritis 02/11/2013    Past Medical History  Diagnosis Date  . Hypertension   . Hyperlipidemia   . Arthritis   . Allergy     Past Surgical History  Procedure Laterality Date  . Appendectomy    . Tonsillectomy and adenoidectomy      History  Substance Use Topics  . Smoking status: Former Games developer  . Smokeless tobacco: Former Neurosurgeon    Quit date: 11/19/2011  . Alcohol Use: 1.8 oz/week    3 Cans of beer per week     Comment: 2 beers weekly    Family History  Problem Relation Age of Onset  . Alzheimer's disease Mother   . Cancer Father   . Aortic aneurysm Son   . Heart disease Son   . Cancer Maternal Grandmother   . Heart disease Maternal Grandfather   . Cancer Paternal Grandmother   . Cancer Paternal Grandfather     Allergies  Allergen Reactions  . Penicillins Hives and Rash    Medication list has been reviewed and updated.  Current Outpatient Prescriptions on File Prior to Visit   Medication Sig Dispense Refill  . CRESTOR 10 MG tablet TAKE 1 TABLET EVERY DAY  30 tablet  0  . etodolac (LODINE) 400 MG tablet Take 400 mg by mouth 2 (two) times daily.      Marland Kitchen lisinopril (PRINIVIL,ZESTRIL) 5 MG tablet TAKE 1 TABLET EVERY DAY  30 tablet  0  . pseudoephedrine-guaifenesin (MUCINEX D) 60-600 MG per tablet Take 1 tablet by mouth every 12 (twelve) hours.  18 tablet  0   No current facility-administered medications on file prior to visit.    Review of Systems:  As per HPI, otherwise negative.    Physical Examination: Filed Vitals:   03/01/13 2017  BP: 151/85  Pulse: 66  Temp: 98 F (36.7 C)  Resp: 16   Filed Vitals:   03/01/13 2017  Height: 6' (1.829 m)  Weight: 280 lb (127.007 kg)   Body mass index is 37.97 kg/(m^2). Ideal Body Weight: Weight in (lb) to have BMI = 25: 183.9  GEN: WDWN, NAD, Non-toxic, A & O x 3 HEENT: Atraumatic, Normocephalic. Neck supple. No masses, No LAD. Ears and Nose: No external deformity. CV: RRR, No M/G/R. No JVD. No thrill. No extra heart sounds. PULM: CTA B, no wheezes, crackles, rhonchi. No retractions. No resp. distress. No accessory muscle use. ABD: S,  NT, ND, +BS. No rebound. No HSM. EXTR: No c/c/e NEURO Normal gait.  PSYCH: Normally interactive. Conversant. Not depressed or anxious appearing.  Calm demeanor.    Assessment and Plan: Seasonal allergic rhinitis Bronchitis mucinex d Robitussin dm  Signed,  Phillips Odor, MD

## 2013-04-04 ENCOUNTER — Other Ambulatory Visit: Payer: Self-pay | Admitting: Physician Assistant

## 2013-05-06 ENCOUNTER — Encounter: Payer: Self-pay | Admitting: Neurology

## 2013-05-07 ENCOUNTER — Other Ambulatory Visit: Payer: Self-pay | Admitting: Physician Assistant

## 2013-05-09 ENCOUNTER — Ambulatory Visit (INDEPENDENT_AMBULATORY_CARE_PROVIDER_SITE_OTHER): Payer: BC Managed Care – PPO | Admitting: Neurology

## 2013-05-09 ENCOUNTER — Encounter: Payer: Self-pay | Admitting: Neurology

## 2013-05-09 VITALS — BP 131/84 | HR 72 | Resp 16 | Ht 72.0 in | Wt 277.0 lb

## 2013-05-09 DIAGNOSIS — R0683 Snoring: Secondary | ICD-10-CM | POA: Insufficient documentation

## 2013-05-09 DIAGNOSIS — R0609 Other forms of dyspnea: Secondary | ICD-10-CM

## 2013-05-09 HISTORY — DX: Snoring: R06.83

## 2013-05-09 NOTE — Patient Instructions (Signed)
Sleep Apnea  Sleep apnea is a sleep disorder characterized by abnormal pauses in breathing while you sleep. When your breathing pauses, the level of oxygen in your blood decreases. This causes you to move out of deep sleep and into light sleep. As a result, your quality of sleep is poor, and the system that carries your blood throughout your body (cardiovascular system) experiences stress. If sleep apnea remains untreated, the following conditions can develop:  High blood pressure (hypertension).  Coronary artery disease.  Inability to achieve or maintain an erection (impotence).  Impairment of your thought process (cognitive dysfunction). There are three types of sleep apnea: 1. Obstructive sleep apnea Pauses in breathing during sleep because of a blocked airway. 2. Central sleep apnea Pauses in breathing during sleep because the area of the brain that controls your breathing does not send the correct signals to the muscles that control breathing. 3. Mixed sleep apnea A combination of both obstructive and central sleep apnea. RISK FACTORS The following risk factors can increase your risk of developing sleep apnea:  Being overweight.  Smoking.  Having narrow passages in your nose and throat.  Being of older age.  Being male.  Alcohol use.  Sedative and tranquilizer use.  Ethnicity. Among individuals younger than 35 years, African Americans are at increased risk of sleep apnea. SYMPTOMS   Difficulty staying asleep.  Daytime sleepiness and fatigue.  Loss of energy.  Irritability.  Loud, heavy snoring.  Morning headaches.  Trouble concentrating.  Forgetfulness.  Decreased interest in sex. DIAGNOSIS  In order to diagnose sleep apnea, your caregiver will perform a physical examination. Your caregiver may suggest that you take a home sleep test. Your caregiver may also recommend that you spend the night in a sleep lab. In the sleep lab, several monitors record  information about your heart, lungs, and brain while you sleep. Your leg and arm movements and blood oxygen level are also recorded. TREATMENT The following actions may help to resolve mild sleep apnea:  Sleeping on your side.   Using a decongestant if you have nasal congestion.   Avoiding the use of depressants, including alcohol, sedatives, and narcotics.   Losing weight and modifying your diet if you are overweight. There also are devices and treatments to help open your airway:  Oral appliances. These are custom-made mouthpieces that shift your lower jaw forward and slightly open your bite. This opens your airway.  Devices that create positive airway pressure. This positive pressure "splints" your airway open to help you breathe better during sleep. The following devices create positive airway pressure:  Continuous positive airway pressure (CPAP) device. The CPAP device creates a continuous level of air pressure with an air pump. The air is delivered to your airway through a mask while you sleep. This continuous pressure keeps your airway open.  Nasal expiratory positive airway pressure (EPAP) device. The EPAP device creates positive air pressure as you exhale. The device consists of single-use valves, which are inserted into each nostril and held in place by adhesive. The valves create very little resistance when you inhale but create much more resistance when you exhale. That increased resistance creates the positive airway pressure. This positive pressure while you exhale keeps your airway open, making it easier to breath when you inhale again.  Bilevel positive airway pressure (BPAP) device. The BPAP device is used mainly in patients with central sleep apnea. This device is similar to the CPAP device because it also uses an air pump to deliver  continuous air pressure through a mask. However, with the BPAP machine, the pressure is set at two different levels. The pressure when you  exhale is lower than the pressure when you inhale.  Surgery. Typically, surgery is only done if you cannot comply with less invasive treatments or if the less invasive treatments do not improve your condition. Surgery involves removing excess tissue in your airway to create a wider passage way. Document Released: 08/08/2002 Document Revised: 02/17/2012 Document Reviewed: 12/25/2011 ExitCare Patient Information 2014 [place], [place]. 1800 Calorie Diet for Diabetes Meal Planning The 1800 calorie diet is designed for eating up to 1800 calories each day. Following this diet and making healthy meal choices can help improve overall health. This diet controls blood sugar (glucose) levels and can also help lower blood pressure and cholesterol. SERVING SIZES Measuring foods and serving sizes helps to make sure you are getting the right amount of food. The list below tells how big or small some common serving sizes are:  1 oz.........4 stacked dice.  3 oz........Marland KitchenDeck of cards.  1 tsp.......Marland KitchenTip of little finger.  1 tbs......Marland KitchenMarland KitchenThumb.  2 tbs.......Marland KitchenGolf ball.   cup......Marland KitchenHalf of a fist.  1 cup.......Marland KitchenA fist. GUIDELINES FOR CHOOSING FOODS The goal of this diet is to eat a variety of foods and limit calories to 1800 each day. This can be done by choosing foods that are low in calories and fat. The diet also suggests eating small amounts of food frequently. Doing this helps control your blood glucose levels so they do not get too high or too low. Each meal or snack may include a protein food source to help you feel more satisfied and to stabilize your blood glucose. Try to eat about the same amount of food around the same time each day. This includes weekend days, travel days, and days off work. Space your meals about 4 to 5 hours apart and add a snack between them if you wish.  For example, a daily food plan could include breakfast, a morning snack, lunch, dinner, and an evening snack. Healthy meals and  snacks include whole grains, vegetables, fruits, lean meats, poultry, fish, and dairy products. As you plan your meals, select a variety of foods. Choose from the bread and starch, vegetable, fruit, dairy, and meat/protein groups. Examples of foods from each group and their suggested serving sizes are listed below. Use measuring cups and spoons to become familiar with what a healthy portion looks like. Bread and Starch Each serving equals 15 grams of carbohydrates.  1 slice bread.   bagel.   cup cold cereal (unsweetened).   cup hot cereal or mashed potatoes.  1 small potato (size of a computer mouse).   cup cooked pasta or rice.   English muffin.  1 cup broth-based soup.  3 cups of popcorn.  4 to 6 whole-wheat crackers.   cup cooked beans, peas, or corn. Vegetable Each serving equals 5 grams of carbohydrates.   cup cooked vegetables.  1 cup raw vegetables.   cup tomato or vegetable juice. Fruit Each serving equals 15 grams of carbohydrates.  1 small apple or orange.  1 cup watermelon or strawberries.   cup applesauce (no sugar added).  2 tbs raisins.   banana.   cup canned fruit, packed in water, its own juice, or sweetened with a sugar substitute.   cup unsweetened fruit juice. Dairy Each serving equals 12 to 15 grams of carbohydrates.  1 cup fat-free milk.  6 oz artificially sweetened yogurt or plain  yogurt.  1 cup low-fat buttermilk.  1 cup soy milk.  1 cup almond milk. Meat/Protein  1 large egg.  2 to 3 oz meat, poultry, or fish.   cup low-fat cottage cheese.  1 tbs peanut butter.  1 oz low-fat cheese.   cup tuna in water.   cup tofu. Fat  1 tsp oil.  1 tsp trans-fat-free margarine.  1 tsp butter.  1 tsp mayonnaise.  2 tbs avocado.  1 tbs salad dressing.  1 tbs cream cheese.  2 tbs sour cream. SAMPLE 1800 CALORIE DIET PLAN Breakfast   cup unsweetened cereal (1 carb serving).  1 cup fat-free milk (1  carb serving).  1 slice whole-wheat toast (1 carb serving).   small banana (1 carb serving).  1 scrambled egg.  1 tsp trans-fat-free margarine. Lunch  Tuna sandwich.  2 slices whole-wheat bread (2 carb servings).   cup canned tuna in water, drained.  1 tbs reduced fat mayonnaise.  1 stalk celery, chopped.  2 slices tomato.  1 lettuce leaf.  1 cup carrot sticks.  24 to 30 seedless grapes (2 carb servings).  6 oz light yogurt (1 carb serving). Afternoon Snack  3 graham cracker squares (1 carb serving).  Fat-free milk, 1 cup (1 carb serving).  1 tbs peanut butter. Dinner  3 oz salmon, broiled with 1 tsp oil.  1 cup mashed potatoes (2 carb servings) with 1 tsp trans-fat-free margarine.  1 cup fresh or frozen green beans.  1 cup steamed asparagus.  1 cup fat-free milk (1 carb serving). Evening Snack  3 cups air-popped popcorn (1 carb serving).  2 tbs parmesan cheese sprinkled on top. MEAL PLAN Use this worksheet to help you make a daily meal plan based on the 1800 calorie diet suggestions. If you are using this plan to help you control your blood glucose, you may interchange carbohydrate-containing foods (dairy, starches, and fruits). Select a variety of fresh foods of varying colors and flavors. The total amount of carbohydrate in your meals or snacks is more important than making sure you include all of the food groups every time you eat. Choose from the following foods to build your day's meals:  8 Starches.  4 Vegetables.  3 Fruits.  2 Dairy.  6 to 7 oz Meat/Protein.  Up to 4 Fats. Your dietician can use this worksheet to help you decide how many servings and which types of foods are right for you. BREAKFAST Food Group and Servings / Food Choice Starch ________________________________________________________ Dairy _________________________________________________________ Fruit  _________________________________________________________ Meat/Protein __________________________________________________ Fat ___________________________________________________________ LUNCH Food Group and Servings / Food Choice Starch ________________________________________________________ Meat/Protein __________________________________________________ Vegetable _____________________________________________________ Fruit _________________________________________________________ Dairy _________________________________________________________ Fat ___________________________________________________________ Aura Fey Food Group and Servings / Food Choice Starch ________________________________________________________ Meat/Protein __________________________________________________ Fruit __________________________________________________________ Dairy _________________________________________________________ Laural Golden Food Group and Servings / Food Choice Starch _________________________________________________________ Meat/Protein ___________________________________________________ Dairy __________________________________________________________ Vegetable ______________________________________________________ Fruit ___________________________________________________________ Fat ____________________________________________________________ Lollie Sails Food Group and Servings / Food Choice Fruit __________________________________________________________ Meat/Protein ___________________________________________________ Dairy __________________________________________________________ Starch _________________________________________________________ DAILY TOTALS Starch ____________________________ Vegetable _________________________ Fruit _____________________________ Dairy _____________________________ Meat/Protein______________________ Fat _______________________________ Document  Released: 03/10/2005 Document Revised: 11/10/2011 Document Reviewed: 07/04/2011 ExitCare Patient Information 2014 [employer]. Exercise to Lose Weight Exercise and a healthy diet may help you lose weight. Your doctor may suggest specific exercises. EXERCISE IDEAS AND TIPS  Choose low-cost things you enjoy doing, such as walking, bicycling, or exercising to workout videos.  Take stairs instead of the elevator.  Walk during your lunch break.  Park your car  further away from work or school.  Go to a gym or an exercise class.  Start with 5 to 10 minutes of exercise each day. Build up to 30 minutes of exercise 4 to 6 days a week.  Wear shoes with good support and comfortable clothes.  Stretch before and after working out.  Work out until you breathe harder and your heart beats faster.  Drink extra water when you exercise.  Do not do so much that you hurt yourself, feel dizzy, or get very short of breath. Exercises that burn about 150 calories:  Running 1  miles in 15 minutes.  Playing volleyball for 45 to 60 minutes.  Washing and waxing a car for 45 to 60 minutes.  Playing touch football for 45 minutes.  Walking 1  miles in 35 minutes.  Pushing a stroller 1  miles in 30 minutes.  Playing basketball for 30 minutes.  Raking leaves for 30 minutes.  Bicycling 5 miles in 30 minutes.  Walking 2 miles in 30 minutes.  Dancing for 30 minutes.  Shoveling snow for 15 minutes.  Swimming laps for 20 minutes.  Walking up stairs for 15 minutes.  Bicycling 4 miles in 15 minutes.  Gardening for 30 to 45 minutes.  Jumping rope for 15 minutes.  Washing windows or floors for 45 to 60 minutes. Document Released: 09/20/2010 Document Revised: 11/10/2011 Document Reviewed: 09/20/2010 [facility] Patient Information 2014 [place], [place].

## 2013-05-09 NOTE — Progress Notes (Signed)
[facility]  Provider:  Melvyn Novas, M D  Referring Provider: No ref. provider found Primary Care Physician:  Tally Due, MD  Chief Complaint  Patient presents with  . New Evaluation    sleep results, rm 10    HPI:  Gregg Smith is a 67 y.o. male  Is seen here as a referral for  Sleep consult  from  [place] , [place].   This Caucasian, right-handed, meanwhile 67 year old gentleman presented on 06-09-12 for a polysomnography study. At the time noted were  past medical history of hypertension, overweight, fatigue and rhinitis as well as hyperlipidemia. He complained of excessive daytime sleepiness and endorsed the  Epworth sleepiness score at 11/24  points and the Beck's inventory at 7 points.   At the time of the sleep study his BMI was 38.4 and his neck circumference measures 17.5 inches.  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  During REM sleep his AHI was 25.7 supine AHI was 8.0 he slept all night and supine. Was nadir of oxygen- saturation was 82%, with 13.2 minutes of desaturation. The patient's study was split based on co- morbidity and high RDI, low o2,  RDI the CPAP was initiated at 5 cm water pressure and advanced to 8 cm water pressure.  His AHI now was 0.7 RDI was 3.7 he did very well on an 8 cm setting I also man I also mentioned that the nasal pillow mask was used with heated humidity during the study.  He never received a CPAP. The patient is also interested in going about adjust her CPAP such as dental devices or an ENT procedure and we discussed all today in detail he still has problems sleeping,  But the recommended  CPAP was never initiated after last years study. The patient has no experience with CPAP thus far.  Interestingly his oxygen desaturation was 72 minutes when he used CPAP , 86% at nadir.   He has dental insurance, no TMJ or facial surgery  history and is interested in a dental device , if applicable.  The patient used to go to bed later and used to rise  later in the morning hours,  but this has recently changed.  He is an Haematologist, a professor of IT ,  he has some liberty of setting his schedule at work . He had a peculiar pattern , arrived to work at 10 AM through early afternoon, went home after lunch  and often slept for a one - two hour  Nap,  then would in all return to work until later in the night.    He has recently changed service his current sleep pattern and is as follows; it was at about 1:30 AM 8 AM. He has not taken as many naps. His naps are shorter now about an hour in duration. Has nocturnal sleep is unfragmented uninterrupted and we will get about 6 hours of nightly sleep. He does not have nocturia he states,  and he does not  Wake himself from snore,  or choking or talking  at night.   He has not kicked himself awake,  neither  his spouse. No reported REM BD.    Sleep history : He was an occasional sleep walker in childhood, not since. No family history of sleep disorder os known. He has one son, without any history of sleep disorder.  He works as a Water quality scientist. [employer] . His work schedule is explained above. The patient never had injuries to the facial skull,  surgeries to the upper airway or nasal passage , nor to the  neck.       Review of Systems: Out of a complete 14 system review, the patient complains of only the following symptoms, and all other reviewed systems are negative.  Epworth now ( untreated apnea ) 13 points, GDS 1 point.   History   Social History  . Marital Status: Married    Spouse Name: N/A    Number of Children: 1  . Years of Education: PHD   Occupational History  .      Professor,[employer]   Social History Main Topics  . Smoking status: Former Games developer  . Smokeless tobacco: Never Used  . Alcohol Use: 1.8 oz/week    3 Cans of beer per week      Comment: 2 beers weekly  . Drug Use: No  . Sexual Activity: Yes    Birth Control/ Protection: None   Other Topics Concern  . Not on file   Social History Narrative  . No narrative on file    Family History  Problem Relation Age of Onset  . Alzheimer's disease Mother   . Cancer Father   . Aortic aneurysm Son   . Heart disease Son   . Cancer Maternal Grandmother   . Heart disease Maternal Grandfather   . Cancer Paternal Grandmother   . Cancer Paternal Grandfather     stomach    Past Medical History  Diagnosis Date  . Hypertension   . Hyperlipidemia   . Arthritis   . Allergy     seasonal   . Rhinitis   . Chicken pox   . Pneumonia   . Mumps   . Leg cramps   . Measles   . Snoring disorder 05/09/2013    Past Surgical History  Procedure Laterality Date  . Appendectomy  1957  . Tonsillectomy and adenoidectomy  1954    Current Outpatient Prescriptions  Medication Sig Dispense Refill  . CRESTOR 10 MG tablet TAKE 1 TABLET BY MOUTH DAILY  30 tablet  2  . lisinopril (PRINIVIL,ZESTRIL) 5 MG tablet TAKE 1 TABLET BY MOUTH DAILY  30 tablet  2  . etodolac (LODINE) 400 MG tablet Take 400 mg by mouth daily as needed.        No current facility-administered medications for this visit.    Allergies as of 05/09/2013 - Review Complete 05/09/2013  Allergen Reaction Noted  . Penicillins Hives and Rash 10/28/2011    Vitals: BP 131/84  Pulse 72  Resp 16  Ht 6' (1.829 m)  Wt 277 lb (125.646 kg)  BMI 37.56 kg/m2 Last Weight:  Wt Readings from Last 1 Encounters:  05/09/13 277 lb (125.646 kg)   Last Height:   Ht Readings from Last 1 Encounters:  05/09/13 6' (1.829 m)    Physical exam:  General: The patient is awake, alert and appears not in acute distress. The patient is well groomed. Head: Normocephalic, atraumatic. Neck is supple. Mallampati 3 , neck circumference: 17 inches  Cardiovascular:  Regular rate and rhythm, without  murmurs or carotid bruit, and without  distended neck veins. Respiratory: Lungs are clear to auscultation. Skin:  Without evidence of edema, or rash Trunk: BMI is  elevated . The  patient  has normal posture.  Neurologic exam : The  patient is awake and alert, oriented to place and time.  Memory subjective  described as intact. There is a normal attention span & concentration ability. Speech is fluent without  dysarthria, dysphonia or aphasia. Mood and affect are appropriate.  Cranial nerves: Pupils are equal and briskly reactive to light. Funduscopic exam without  evidence of pallor or edema.  Extraocular movements  in vertical and horizontal planes intact and without nystagmus. Visual fields by finger perimetry are intact. Hearing to finger rub intact.  Facial sensation intact to fine touch. Facial motor strength is symmetric and tongue and uvula move midline.  Motor exam:   Normal tone and normal muscle bulk and symmetric normal strength in all extremities.  Sensory:  Fine touch, pinprick and vibration were tested in all extremities. Proprioception is  normal.  Coordination: Rapid alternating movements in the fingers/hands is tested and normal. Finger-to-nose maneuver tested and normal without evidence of ataxia, dysmetria or tremor.  Gait and station: Patient walks without assistive device and is able and assisted stool climb up to the exam table. Strength within normal limits. Stance is stable and normal.   Deep tendon reflexes: in the  upper and lower extremities are symmetric and intact.  Assessment; Adeline has 2 risk factors for upper airway resistance the and his mild apnea.  #1 is his body mass index which is fairly high and was already at the time of the study at 275 pounds a BMI of 38.4. His reduction of the body mass index will certainly lead to a reduction in AHI.  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  I would give the patient some additional information about weight loss, sleep hygiene and also explained the risk factors of untreated sleep apnea.   PLAN; He has already made changes in his day and night routine and overall has a more and sleep pattern which will help to establish deeper sleep.  45 minute visit and education. Referral to dentist made .

## 2013-07-07 ENCOUNTER — Other Ambulatory Visit: Payer: Self-pay

## 2013-08-19 ENCOUNTER — Other Ambulatory Visit: Payer: Self-pay | Admitting: Emergency Medicine

## 2013-08-23 ENCOUNTER — Encounter: Payer: Self-pay | Admitting: Neurology

## 2013-11-08 ENCOUNTER — Ambulatory Visit: Payer: BC Managed Care – PPO | Admitting: Neurology

## 2013-12-31 ENCOUNTER — Other Ambulatory Visit: Payer: Self-pay | Admitting: Physician Assistant

## 2014-02-06 ENCOUNTER — Ambulatory Visit (INDEPENDENT_AMBULATORY_CARE_PROVIDER_SITE_OTHER): Payer: BC Managed Care – PPO | Admitting: Family Medicine

## 2014-02-06 ENCOUNTER — Ambulatory Visit (INDEPENDENT_AMBULATORY_CARE_PROVIDER_SITE_OTHER): Payer: BC Managed Care – PPO

## 2014-02-06 VITALS — BP 130/84 | HR 71 | Temp 98.6°F | Resp 16 | Ht 71.0 in | Wt 279.8 lb

## 2014-02-06 DIAGNOSIS — S93609A Unspecified sprain of unspecified foot, initial encounter: Secondary | ICD-10-CM

## 2014-02-06 DIAGNOSIS — M79609 Pain in unspecified limb: Secondary | ICD-10-CM

## 2014-02-06 DIAGNOSIS — M25571 Pain in right ankle and joints of right foot: Secondary | ICD-10-CM

## 2014-02-06 DIAGNOSIS — S93409A Sprain of unspecified ligament of unspecified ankle, initial encounter: Secondary | ICD-10-CM

## 2014-02-06 DIAGNOSIS — S93401A Sprain of unspecified ligament of right ankle, initial encounter: Secondary | ICD-10-CM

## 2014-02-06 DIAGNOSIS — M25579 Pain in unspecified ankle and joints of unspecified foot: Secondary | ICD-10-CM

## 2014-02-06 DIAGNOSIS — S93601A Unspecified sprain of right foot, initial encounter: Secondary | ICD-10-CM

## 2014-02-06 DIAGNOSIS — M79671 Pain in right foot: Secondary | ICD-10-CM

## 2014-02-06 MED ORDER — MELOXICAM 7.5 MG PO TABS: 7.5000 mg | ORAL_TABLET | Freq: Two times a day (BID) | ORAL | Status: DC

## 2014-02-06 NOTE — Progress Notes (Signed)
This chart was scribed for Robyn Haber, MD by Vernell Barrier, Medical Scribe. The patient was seen in room 11. This patient's care was started at 5:59 PM  Patient ID: Gregg Smith MRN: [id number], DOB: 1946/05/30, 68 y.o. Date of Encounter: 02/06/2014, 5:59 PM  Primary Physician: Kennon Portela, MD  Chief Complaint: R Ankle Injury  HPI: 68 y.o. year old male with history below presents with gradually worsening pain and swelling to the right ankle; onset 2 days ago. Able to apply weight and pressure but with pain. States he stepped in a hole and rolled the ankle. Also presents with some soreness along the 5th metatarsal on the right foot. States this pain began before the left ankle injury. Works as a Network engineer at [employer].  Past Medical History  Diagnosis Date   Hypertension    Hyperlipidemia    Arthritis    Allergy     seasonal    Rhinitis    Chicken pox    Pneumonia    Mumps    Leg cramps    Measles    Snoring disorder 05/09/2013     Home Meds: Prior to Admission medications   Medication Sig Start Date End Date Taking? Authorizing Provider  etodolac (LODINE) 400 MG tablet Take 400 mg by mouth daily as needed.    Yes Historical Provider, MD  lisinopril (PRINIVIL,ZESTRIL) 5 MG tablet Take 1 tablet (5 mg total) by mouth daily. PATIENT NEEDS OFFICE VISIT FOR ADDITIONAL REFILLS   Yes Theda Sers, PA-C  rosuvastatin (CRESTOR) 10 MG tablet Take 1 tablet (10 mg total) by mouth daily. PATIENT NEEDS OFFICE VISIT FOR ADDITIONAL REFILLS   Yes Theda Sers, PA-C    Allergies:  Allergies  Allergen Reactions   Penicillins Hives and Rash    History   Social History   Marital Status: Married    Spouse Name: N/A    Number of Children: 1   Years of Education: PHD   Occupational History        Professor,[employer]   Social History Main Topics   Smoking status: Former Smoker   Smokeless tobacco: Never Used   Alcohol Use:  1.8 oz/week    3 Cans of beer per week     Comment: 2 beers weekly   Drug Use: No   Sexual Activity: Yes    Museum/gallery curator: None   Other Topics Concern   Not on file   Social History Narrative   No narrative on file     Review of Systems: Constitutional: negative for chills, fever, night sweats, weight changes, or fatigue  HEENT: negative for vision changes, hearing loss, congestion, rhinorrhea, ST, epistaxis, or sinus pressure Cardiovascular: negative for chest pain or palpitations Respiratory: negative for hemoptysis, wheezing, shortness of breath, or cough Abdominal: negative for abdominal pain, nausea, vomiting, diarrhea, or constipation Dermatological: negative for rash Neurologic: negative for headache, dizziness, or syncope All other systems reviewed and are otherwise negative with the exception to those above and in the HPI.   Physical Exam: Blood pressure 130/84, pulse 71, temperature 98.6 F (37 C), temperature source Oral, resp. rate 16, height 5\' 11"  (1.803 m), weight 279 lb 12.8 oz (126.916 kg), SpO2 95.00%., Body mass index is 39.04 kg/(m^2). General: Well developed, well nourished, in no acute distress. Head: Normocephalic, atraumatic, eyes without discharge, sclera non-icteric, nares are without discharge. Bilateral auditory canals clear, TM's are without perforation, pearly grey and translucent with reflective cone of  light bilaterally. Oral cavity moist, posterior pharynx without exudate, erythema, peritonsillar abscess, or post nasal drip.  Neck: Supple. No thyromegaly. Full ROM. No lymphadenopathy. Lungs: Clear bilaterally to auscultation without wheezes, rales, or rhonchi. Breathing is unlabored. Heart: RRR with S1 S2. No murmurs, rubs, or gallops appreciated. Abdomen: Soft, non-tender, non-distended with normoactive bowel sounds. No hepatomegaly. No rebound/guarding. No obvious abdominal masses. Msk:  Strength and tone normal for  age. Extremities/Skin: Warm and dry. No clubbing or cyanosis. No edema. No rashes or suspicious lesions.  R Lower Leg: No achilles tenderness. No calf tenderness. Moderate swelling over the entire foot. Tenderness over the 5th metatarsal. Tenderness over the lateral malleolus of the right foot. Neuro: Alert and oriented X 3. Moves all extremities spontaneously. Gait is normal. CNII-XII grossly in tact. Psych:  Responds to questions appropriately with a normal affect.   [facility] reading (PRIMARY) by  Dr. Joseph Art:  Negative right ankle and foot.     ASSESSMENT AND PLAN:  68 y.o. year old male with right ankle and foot sprain Lace up ankle splint x 10 days Ankle pain, right - Plan: DG Ankle Complete Right, meloxicam (MOBIC) 7.5 MG tablet  Foot pain, right - Plan: DG Foot Complete Right, meloxicam (MOBIC) 7.5 MG tablet  Sprain of ankle, right - Plan: meloxicam (MOBIC) 7.5 MG tablet  Sprain of foot, right - Plan: meloxicam (MOBIC) 7.5 MG tablet  Signed, Robyn Haber, MD 02/06/2014 5:59 PM  I personally performed the services described in this documentation, which was scribed in my presence. The recorded information has been reviewed and is accurate.

## 2014-02-06 NOTE — Patient Instructions (Signed)

## 2014-02-07 ENCOUNTER — Other Ambulatory Visit: Payer: Self-pay | Admitting: Physician Assistant

## 2014-02-07 ENCOUNTER — Encounter: Payer: Self-pay | Admitting: Internal Medicine

## 2014-02-07 MED ORDER — LISINOPRIL 5 MG PO TABS: 5.0000 mg | ORAL_TABLET | Freq: Every day | ORAL | Status: DC

## 2014-02-07 MED ORDER — ROSUVASTATIN CALCIUM 10 MG PO TABS: 10.0000 mg | ORAL_TABLET | Freq: Every day | ORAL | Status: DC

## 2014-02-07 NOTE — Telephone Encounter (Signed)
Had pt make an appt for a follow up. It's with Dr. Marin Comment on 02-07-14. Meds RF'ed until then

## 2014-02-17 ENCOUNTER — Encounter: Payer: Self-pay | Admitting: Family Medicine

## 2014-02-17 ENCOUNTER — Ambulatory Visit (INDEPENDENT_AMBULATORY_CARE_PROVIDER_SITE_OTHER): Payer: BC Managed Care – PPO | Admitting: Family Medicine

## 2014-02-17 VITALS — BP 138/84 | HR 65 | Temp 98.5°F | Resp 18 | Ht 71.0 in | Wt 275.8 lb

## 2014-02-17 DIAGNOSIS — M25561 Pain in right knee: Secondary | ICD-10-CM

## 2014-02-17 DIAGNOSIS — M25562 Pain in left knee: Secondary | ICD-10-CM

## 2014-02-17 DIAGNOSIS — E785 Hyperlipidemia, unspecified: Secondary | ICD-10-CM

## 2014-02-17 DIAGNOSIS — M25569 Pain in unspecified knee: Secondary | ICD-10-CM

## 2014-02-17 DIAGNOSIS — I10 Essential (primary) hypertension: Secondary | ICD-10-CM

## 2014-02-17 MED ORDER — LISINOPRIL 5 MG PO TABS: 5.0000 mg | ORAL_TABLET | Freq: Every day | ORAL | Status: DC

## 2014-02-17 MED ORDER — ROSUVASTATIN CALCIUM 10 MG PO TABS: 10.0000 mg | ORAL_TABLET | Freq: Every day | ORAL | Status: DC

## 2014-02-17 MED ORDER — MELOXICAM 7.5 MG PO TABS
7.5000 mg | ORAL_TABLET | Freq: Two times a day (BID) | ORAL | Status: DC
Start: 2014-02-17 — End: 2014-03-22

## 2014-02-17 NOTE — Progress Notes (Addendum)
Chief Complaint:  Chief Complaint  Patient presents with  . Medication Refill    LISNOPRIL AND CRESTOR    HPI: Gregg Smith is a 68 y.o. male who is here for  Medication refills.  He had missed doses x 2-3 but has been taking it regular.  Does not measure BP, denies any SEs, no CP, SOB, dizziness, msk aches.  HTN , XOL x 7, 10 years respectively  He is a Government social research officer at [employer]  Past Medical History  Diagnosis Date  . Hypertension   . Hyperlipidemia   . Arthritis   . Allergy     seasonal   . Rhinitis   . Chicken pox   . Pneumonia   . Mumps   . Leg cramps   . Measles   . Snoring disorder 05/09/2013   Past Surgical History  Procedure Laterality Date  . Appendectomy  1957  . Tonsillectomy and adenoidectomy  1954   History   Social History  . Marital Status: Married    Spouse Name: N/A    Number of Children: 1  . Years of Education: PHD   Occupational History  .      Professor,[employer]   Social History Main Topics  . Smoking status: Former Research scientist (life sciences)  . Smokeless tobacco: Never Used  . Alcohol Use: 1.8 oz/week    3 Cans of beer per week     Comment: 2 beers weekly  . Drug Use: No  . Sexual Activity: Yes    Birth Control/ Protection: None   Other Topics Concern  . None   Social History Narrative  . None   Family History  Problem Relation Age of Onset  . Alzheimer's disease Mother   . Cancer Father   . Aortic aneurysm Son   . Heart disease Son   . Cancer Maternal Grandmother   . Heart disease Maternal Grandfather   . Cancer Paternal Grandmother   . Cancer Paternal Grandfather     stomach   Allergies  Allergen Reactions  . Penicillins Hives and Rash   Prior to Admission medications   Medication Sig Start Date End Date Taking? Authorizing Provider  etodolac (LODINE) 400 MG tablet Take 400 mg by mouth daily as needed.    Yes Historical Provider, MD  lisinopril (PRINIVIL,ZESTRIL) 5 MG tablet Take 1 tablet (5 mg  total) by mouth daily. PATIENT NEEDS OFFICE VISIT FOR ADDITIONAL REFILLS 02/07/14  Yes Robyn Haber, MD  rosuvastatin (CRESTOR) 10 MG tablet Take 1 tablet (10 mg total) by mouth daily. PATIENT NEEDS OFFICE VISIT FOR ADDITIONAL REFILLS 02/07/14  Yes Robyn Haber, MD  meloxicam (MOBIC) 7.5 MG tablet Take 1 tablet (7.5 mg total) by mouth 2 (two) times daily. 02/06/14   Robyn Haber, MD     ROS: The patient denies fevers, chills, night sweats, unintentional weight loss, chest pain, palpitations, wheezing, dyspnea on exertion, nausea, vomiting, abdominal pain, dysuria, hematuria, melena, numbness, weakness, or tingling.   All other systems have been reviewed and were otherwise negative with the exception of those mentioned in the HPI and as above.    PHYSICAL EXAM: Filed Vitals:   02/17/14 1111  BP: 138/84  Pulse: 65  Temp: 98.5 F (36.9 C)  Resp: 18   Filed Vitals:   02/17/14 1111  Height: 5\' 11"  (1.803 m)  Weight: 275 lb 12.8 oz (125.102 kg)   Body mass index is 38.48 kg/(m^2).  General: Alert, no acute distress, obese  white male HEENT:  Normocephalic, atraumatic, oropharynx patent. EOMI, PERRLA Cardiovascular:  Regular rate and rhythm, no rubs murmurs or gallops.  No Carotid bruits, radial pulse intact. + 1pedal edema.  Respiratory: Clear to auscultation bilaterally.  No wheezes, rales, or rhonchi.  No cyanosis, no use of accessory musculature GI: No organomegaly, abdomen is soft and non-tender, positive bowel sounds.  No masses. Skin: No rashes. Neurologic: Facial musculature symmetric. Psychiatric: Patient is appropriate throughout our interaction. Lymphatic: No cervical lymphadenopathy Musculoskeletal: Gait intact.   LABS: Results for orders placed in visit on 02/11/13  POCT CBC      Result Value Ref Range   WBC 10.9 (*) 4.6 - 10.2 K/uL   Lymph, poc 1.2  0.6 - 3.4   POC LYMPH PERCENT 10.7  10 - 50 %L   MID (cbc) 0.8  0 - 0.9   POC MID % 7.0  0 - 12 %M   POC  Granulocyte 9.0 (*) 2 - 6.9   Granulocyte percent 82.3 (*) 37 - 80 %G   RBC 5.46  4.69 - 6.13 M/uL   Hemoglobin 16.0  14.1 - 18.1 g/dL   HCT, POC 49.4  43.5 - 53.7 %   MCV 90.4  80 - 97 fL   MCH, POC 29.3  27 - 31.2 pg   MCHC 32.4  31.8 - 35.4 g/dL   RDW, POC 15.3     Platelet Count, POC 192  142 - 424 K/uL   MPV 11.5  0 - 99.8 fL  POCT RAPID STREP A (OFFICE)      Result Value Ref Range   Rapid Strep A Screen Negative  Negative     EKG/XRAY:   Primary [facility] Marin [facility]   ASSESSMENT/PLAN: Encounter Diagnoses  Name Primary?  . Essential hypertension Yes  . Hyperlipidemia   . Knee pain, bilateral    Refilled meds, monitor for pedal edema and CHF sxs  Labs pending F/u in 6 months Advise to get annual visit since overdue   Gross sideeffects, risk and benefits, and alternatives of medications d/w patient. Patient is aware that all medications have potential sideeffects and we are unable to predict every sideeffect or drug-drug interaction that may occur.  LE, [place], DO 02/17/2014 11:52 AM

## 2014-02-18 LAB — COMPLETE METABOLIC PANEL WITH GFR
ALT: 13 U/L (ref 0–53)
AST: 24 U/L (ref 0–37)
Albumin: 4 g/dL (ref 3.5–5.2)
CO2: 27 mEq/L (ref 19–32)
Calcium: 9.3 mg/dL (ref 8.4–10.5)
Chloride: 108 mEq/L (ref 96–112)
GFR, Est African American: >89 mL/min
Sodium: 143 mEq/L (ref 135–145)
Total Bilirubin: 0.7 mg/dL (ref 0.2–1.2)
Total Protein: 6.7 g/dL (ref 6.0–8.3)

## 2014-02-18 LAB — COMPLETE METABOLIC PANEL WITHOUT GFR
Alkaline Phosphatase: 61 U/L (ref 39–117)
BUN: 13 mg/dL (ref 6–23)
Creat: 1 mg/dL (ref 0.50–1.35)
GFR, Est Non African American: 78 mL/min
Glucose, Bld: 84 mg/dL (ref 70–99)
Potassium: 4.3 meq/L (ref 3.5–5.3)

## 2014-02-18 LAB — TSH: TSH: 1.228 u[IU]/mL (ref 0.350–4.500)

## 2014-02-18 LAB — MICROALBUMIN, URINE: Microalb, Ur: 1.29 mg/dL (ref 0.00–1.89)

## 2014-02-18 LAB — LDL CHOLESTEROL, DIRECT: Direct LDL: 91 mg/dL

## 2014-03-08 ENCOUNTER — Encounter: Payer: Self-pay | Admitting: Family Medicine

## 2014-03-22 ENCOUNTER — Encounter: Payer: Self-pay | Admitting: Neurology

## 2014-03-22 ENCOUNTER — Ambulatory Visit (INDEPENDENT_AMBULATORY_CARE_PROVIDER_SITE_OTHER): Payer: BC Managed Care – PPO | Admitting: Neurology

## 2014-03-22 VITALS — BP 147/88 | HR 63 | Ht 72.0 in | Wt 279.0 lb

## 2014-03-22 DIAGNOSIS — G4733 Obstructive sleep apnea (adult) (pediatric): Secondary | ICD-10-CM | POA: Insufficient documentation

## 2014-03-22 DIAGNOSIS — G478 Other sleep disorders: Secondary | ICD-10-CM | POA: Insufficient documentation

## 2014-03-22 NOTE — Patient Instructions (Signed)
Sleep Apnea  Sleep apnea is a sleep disorder characterized by abnormal pauses in breathing while you sleep. When your breathing pauses, the level of oxygen in your blood decreases. This causes you to move out of deep sleep and into light sleep. As a result, your quality of sleep is poor, and the system that carries your blood throughout your body (cardiovascular system) experiences stress. If sleep apnea remains untreated, the following conditions can develop:  High blood pressure (hypertension).  Coronary artery disease.  Inability to achieve or maintain an erection (impotence).  Impairment of your thought process (cognitive dysfunction). There are three types of sleep apnea: 1. Obstructive sleep apnea--Pauses in breathing during sleep because of a blocked airway. 2. Central sleep apnea--Pauses in breathing during sleep because the area of the brain that controls your breathing does not send the correct signals to the muscles that control breathing. 3. Mixed sleep apnea--A combination of both obstructive and central sleep apnea. RISK FACTORS The following risk factors can increase your risk of developing sleep apnea:  Being overweight.  Smoking.  Having narrow passages in your nose and throat.  Being of older age.  Being male.  Alcohol use.  Sedative and tranquilizer use.  Ethnicity. Among individuals younger than 35 years, African Americans are at increased risk of sleep apnea. SYMPTOMS   Difficulty staying asleep.  Daytime sleepiness and fatigue.  Loss of energy.  Irritability.  Loud, heavy snoring.  Morning headaches.  Trouble concentrating.  Forgetfulness.  Decreased interest in sex. DIAGNOSIS  In order to diagnose sleep apnea, your caregiver will perform a physical examination. Your caregiver may suggest that you take a home sleep test. Your caregiver may also recommend that you spend the night in a sleep lab. In the sleep lab, several monitors record  information about your heart, lungs, and brain while you sleep. Your leg and arm movements and blood oxygen level are also recorded. TREATMENT The following actions may help to resolve mild sleep apnea:  Sleeping on your side.   Using a decongestant if you have nasal congestion.   Avoiding the use of depressants, including alcohol, sedatives, and narcotics.   Losing weight and modifying your diet if you are overweight. There also are devices and treatments to help open your airway:  Oral appliances. These are custom-made mouthpieces that shift your lower jaw forward and slightly open your bite. This opens your airway.  Devices that create positive airway pressure. This positive pressure "splints" your airway open to help you breathe better during sleep. The following devices create positive airway pressure:  Continuous positive airway pressure (CPAP) device. The CPAP device creates a continuous level of air pressure with an air pump. The air is delivered to your airway through a mask while you sleep. This continuous pressure keeps your airway open.  Nasal expiratory positive airway pressure (EPAP) device. The EPAP device creates positive air pressure as you exhale. The device consists of single-use valves, which are inserted into each nostril and held in place by adhesive. The valves create very little resistance when you inhale but create much more resistance when you exhale. That increased resistance creates the positive airway pressure. This positive pressure while you exhale keeps your airway open, making it easier to breath when you inhale again.  Bilevel positive airway pressure (BPAP) device. The BPAP device is used mainly in patients with central sleep apnea. This device is similar to the CPAP device because it also uses an air pump to deliver continuous air pressure   through a mask. However, with the BPAP machine, the pressure is set at two different levels. The pressure when you  exhale is lower than the pressure when you inhale.  Surgery. Typically, surgery is only done if you cannot comply with less invasive treatments or if the less invasive treatments do not improve your condition. Surgery involves removing excess tissue in your airway to create a wider passage way. Document Released: 08/08/2002 Document Revised: 12/13/2012 Document Reviewed: 12/25/2011 ExitCare Patient Information 2015 [employer]. This information is not intended to replace advice given to you by your health care provider. Make sure you discuss any questions you have with your health care provider.  

## 2014-03-22 NOTE — Progress Notes (Signed)
[facility]  Provider:  Larey Seat, M D  Referring Provider: Orma Flaming, MD Primary Care Physician:  Kennon Portela, MD  Chief Complaint  Patient presents with  . Follow-up    Room 10  . Snoring disorder    HPI:  Gregg Smith is a 68 y.o. male , who is seen here as a  Revisit : I had seen this patient after a sleep study indicating only mild sleep apnea but do to, but it is the study was split and the patient titrated to 8 cm water. When we met after the sleep study I indicated that a dental device may well be enough to treat his mild degree of sleep apnea , which was not associated with significant oxygen desaturations. The patient obtained a dental device, and his wife noted no longer any snoring. He feels much more rested and this is reflected in a lower Epworth score ( 11) , no longer needing power naps. No inadvertent "snoozing off", his energy level dips around 4 PM. He sleeps more sound at night.  His bed time is from 1.30 AM to 7.30 AM, and he has no nocturia breaks. He rarely wakes up with headaches.  He recently shared a room during a professional conference and forgot his dental device- to the dismay of his room mate.  Dr.Mark Ron Parker has his bridgework done on the bottom and upper jaw and 2 teeth had broken off, making the appliance harder to anchor.  Dr Ron Parker has arranged for a follow up home sleep test through his office.    LAST VISIT note: Referral for Sleep consult  from  [place] , [place]. This Caucasian, right-handed, meanwhile 68 year old gentleman presented on 06-09-12 for a polysomnography study.  At the time noted were an Epworth sleepiness score at 11/24  points and Beck's inventory at 7 points.  BMI was 38.4 and  neck circumference measures 17.5 inches.  The patient had an AHI of only 8/hr. 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   During REM sleep his AHI was 25.7 supine AHI  was 8.0 he slept all night and supine. The  nadir of oxygen- saturation was 82%, with 13.2 minutes of desaturation.  The patient's study was split based on co- morbidity and high RDI, low oxygen,.  CPAP was initiated at 5 cm water pressure and advanced to 8 cm water pressure.  His AHI now was 0.7 RDI was 3.7 he did very well on an 8 cm setting I also man I also mentioned that the nasal pillow mask was used with heated humidity during the study.  He never received a CPAP.  The patient has no experience with CPAP thus far. Interestingly his oxygen desaturation was 72 minutes when he used CPAP , 86% at nadir.   He has dental insurance, no TMJ or facial surgery history and is interested in a dental device , if applicable since. No family history of sleep disorder os known. He has one son, without any history of sleep disorder.  He works as a Personnel officer.  His work schedule is explained above.  The patient never had injuries to the facial skull,  surgeries to the upper airway or nasal passage , nor to the neck.       Review of Systems: Out of a complete 14 system review, the patient complains of only the following symptoms, and all other reviewed systems are  negative.  Epworth now  11 points, from 13 , GDS 1 point. FSS not endorsed .   History   Social History  . Marital Status: Married    Spouse Name: Gregg Smith    Number of Children: 1  . Years of Education: PHD   Occupational History  .      Professor,[employer]  .  [place]   Social History Main Topics  . Smoking status: Former Research scientist (life sciences)  . Smokeless tobacco: Never Used  . Alcohol Use: 1.8 oz/week    3 Cans of beer per week     Comment: 2 beers weekly  . Drug Use: No  . Sexual Activity: Yes    Birth Control/ Protection: None   Other Topics Concern  . Not on file   Social History Narrative   Patient is married Charity fundraiser) and lives at home with his wife.   Patient is working full-time.    Patient has a Ph.D   Patient has one adult child.   Patient is right-handed.   Patient drinks two cups of tea daily.    Family History  Problem Relation Age of Onset  . Alzheimer's disease Mother   . Cancer Father   . Aortic aneurysm Son   . Heart disease Son   . Cancer Maternal Grandmother   . Heart disease Maternal Grandfather   . Cancer Paternal Grandmother   . Cancer Paternal Grandfather     stomach    Past Medical History  Diagnosis Date  . Hypertension   . Hyperlipidemia   . Arthritis   . Allergy     seasonal   . Rhinitis   . Chicken pox   . Pneumonia   . Mumps   . Leg cramps   . Measles   . Snoring disorder 05/09/2013    Past Surgical History  Procedure Laterality Date  . Appendectomy  1957  . Tonsillectomy and adenoidectomy  1954    Current Outpatient Prescriptions  Medication Sig Dispense Refill  . etodolac (LODINE) 400 MG tablet Take 400 mg by mouth daily as needed.       Marland Kitchen lisinopril (PRINIVIL,ZESTRIL) 5 MG tablet Take 1 tablet (5 mg total) by mouth daily.  90 tablet  3  . rosuvastatin (CRESTOR) 10 MG tablet Take 1 tablet (10 mg total) by mouth daily.  90 tablet  3   No current facility-administered medications for this visit.    Allergies as of 03/22/2014 - Review Complete 03/22/2014  Allergen Reaction Noted  . Penicillins Hives and Rash 10/28/2011    Vitals: There were no vitals taken for this visit. Last Weight:  Wt Readings from Last 1 Encounters:  02/17/14 275 lb 12.8 oz (125.102 kg)   Last Height:   Ht Readings from Last 1 Encounters:  02/17/14 5' 11"  (1.803 m)    Physical exam:  General: The patient is awake, alert and appears not in acute distress. The patient is well groomed. Head: Normocephalic, atraumatic. Neck is supple. Mallampati 3 , neck circumference: 17 inches  Cardiovascular:  Regular rate and rhythm, without  murmurs or carotid bruit, and without distended neck veins. Respiratory: Lungs are clear to  auscultation. Skin:  Without evidence of edema, or rash Trunk: BMI is  elevated . The  patient  has normal posture.  Neurologic exam : The patient is awake and alert, oriented to place and time.  Memory subjective  described as intact.  Speech is fluent without  dysarthria, dysphonia  or aphasia.  He has a lisp due to the missing teeth in his front lower jar.   Mood and affect are appropriate.  Cranial nerves: Pupils are equal and briskly reactive to light.  Extraocular movements  in vertical and horizontal planes intact and without nystagmus. Visual fields by finger perimetry are intact. Hearing to finger rub intact.   Facial sensation intact to fine touch. Facial motor strength is symmetric and tongue  moves midline. His uvula is visible, but not midline- the left is lower - a possible injury from tosillectomy in childhood ( age 74 ) .   Motor exam:   Normal tone and normal muscle bulk ,strength in all extremities.  Sensory:  Fine touch, pinprick and vibration were tested in all extremities. Proprioception is normal.  Coordination: Rapid alternating movements in the fingers/hands is tested and normal.  Gait and station: Patient walks without assistive device and is able and assisted stool climb up to the exam table.  Strength within normal limits. Stance is stable and normal.   Deep tendon reflexes: in the  upper and lower extremities are symmetric and intact.  This patient has 2 risk factors for upper airway resistance and his mild apnea.   #1 is his body mass index ,  which is fairly high and was already at the time of the study at 275 pounds a BMI of 38.4. His reduction of the body mass index will certainly lead to a reduction in AHI.    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

## 2014-08-04 ENCOUNTER — Encounter: Payer: BC Managed Care – PPO | Admitting: Family Medicine

## 2014-09-18 ENCOUNTER — Encounter: Payer: Self-pay | Admitting: Internal Medicine

## 2014-09-19 ENCOUNTER — Ambulatory Visit (INDEPENDENT_AMBULATORY_CARE_PROVIDER_SITE_OTHER): Payer: BC Managed Care – PPO | Admitting: Internal Medicine

## 2014-09-19 VITALS — BP 150/80 | HR 70 | Temp 98.4°F | Resp 18 | Ht 71.0 in | Wt 283.2 lb

## 2014-09-19 DIAGNOSIS — I10 Essential (primary) hypertension: Secondary | ICD-10-CM

## 2014-09-19 DIAGNOSIS — Z79899 Other long term (current) drug therapy: Secondary | ICD-10-CM

## 2014-09-19 DIAGNOSIS — Z719 Counseling, unspecified: Secondary | ICD-10-CM

## 2014-09-19 LAB — POCT CBC
GRANULOCYTE PERCENT: 73.5 % (ref 37–80)
HCT, POC: 47 % (ref 43.5–53.7)
Hemoglobin: 15.8 g/dL (ref 14.1–18.1)
Lymph, poc: 1.5 (ref 0.6–3.4)
MCH, POC: 29.6 pg (ref 27–31.2)
MCHC: 33.6 g/dL (ref 31.8–35.4)
MCV: 88.2 fL (ref 80–97)
MID (cbc): 0.7 (ref 0–0.9)
MPV: 7.8 fL (ref 0–99.8)
PLATELET COUNT, POC: 204 10*3/uL (ref 142–424)
POC GRANULOCYTE: 6 (ref 2–6.9)
POC LYMPH %: 17.8 % (ref 10–50)
POC MID %: 8.7 %M (ref 0–12)
RBC: 5.33 M/uL (ref 4.69–6.13)
RDW, POC: 14.2 %
WBC: 8.2 10*3/uL (ref 4.6–10.2)

## 2014-09-19 LAB — COMPREHENSIVE METABOLIC PANEL WITH GFR
ALT: 19 U/L (ref 0–53)
AST: 28 U/L (ref 0–37)
Albumin: 4 g/dL (ref 3.5–5.2)
Alkaline Phosphatase: 59 U/L (ref 39–117)
BUN: 16 mg/dL (ref 6–23)
CO2: 25 meq/L (ref 19–32)
Calcium: 9.2 mg/dL (ref 8.4–10.5)
Chloride: 105 meq/L (ref 96–112)
Creat: 0.93 mg/dL (ref 0.50–1.35)
Glucose, Bld: 75 mg/dL (ref 70–99)
Potassium: 4.1 meq/L (ref 3.5–5.3)
Sodium: 140 meq/L (ref 135–145)
Total Bilirubin: 0.5 mg/dL (ref 0.2–1.2)
Total Protein: 6.9 g/dL (ref 6.0–8.3)

## 2014-09-19 LAB — POCT UA - MICROSCOPIC ONLY
Bacteria, U Microscopic: NEGATIVE
CASTS, UR, LPF, POC: NEGATIVE
CRYSTALS, UR, HPF, POC: NEGATIVE
MUCUS UA: NEGATIVE
RBC, URINE, MICROSCOPIC: 0
YEAST UA: NEGATIVE

## 2014-09-19 LAB — POCT URINALYSIS DIPSTICK
BILIRUBIN UA: NEGATIVE
Blood, UA: NEGATIVE
Glucose, UA: NEGATIVE
Ketones, UA: NEGATIVE
LEUKOCYTES UA: NEGATIVE
Nitrite, UA: NEGATIVE
Protein, UA: NEGATIVE
Spec Grav, UA: 1.02
UROBILINOGEN UA: 0.2
pH, UA: 5.5

## 2014-09-19 MED ORDER — LISINOPRIL-HYDROCHLOROTHIAZIDE 10-12.5 MG PO TABS: 1.0000 | ORAL_TABLET | Freq: Every day | ORAL | Status: DC

## 2014-09-19 NOTE — Progress Notes (Signed)
   Subjective:    Patient ID: Gregg Smith, male    DOB: September 10, 1945, 69 y.o.   MRN: [id number]  HPI CO of HBP, home bps 150-160s/90-100s He feels fine, no ha,dizzy,co,sob. He has physical with Dr. Marin Comment in 3 weeks. I will do screening lab work for th exam now to help things along. No co pain or any sxs now. Has gradually gained weight.  Review of Systems     Objective:   Physical Exam  Constitutional: He is oriented to person, place, and time. He appears well-developed and well-nourished.  HENT:  Head: Normocephalic.  Mouth/Throat: Oropharynx is clear and moist.  Eyes: EOM are normal. Pupils are equal, round, and reactive to light.  Neck: Normal range of motion.  Cardiovascular: Normal rate, regular rhythm and normal heart sounds.   Pulmonary/Chest: Effort normal and breath sounds normal.  Neurological: He is alert and oriented to person, place, and time. He exhibits normal muscle tone. Coordination normal.  Psychiatric: He has a normal mood and affect. His behavior is normal. Judgment and thought content normal.  Vitals reviewed.  Oximetry 96% pulse 75       Assessment & Plan:  HTN progressed Increase dose and add HCTZ/Lisinopril-hctz 10/12.5

## 2014-09-19 NOTE — Patient Instructions (Signed)
Hypertension Hypertension, commonly called high blood pressure, is when the force of blood pumping through your arteries is too strong. Your arteries are the blood vessels that carry blood from your heart throughout your body. A blood pressure reading consists of a higher number over a lower number, such as 110/72. The higher number (systolic) is the pressure inside your arteries when your heart pumps. The lower number (diastolic) is the pressure inside your arteries when your heart relaxes. Ideally you want your blood pressure below 120/80. Hypertension forces your heart to work harder to pump blood. Your arteries may become narrow or stiff. Having hypertension puts you at risk for heart disease, stroke, and other problems.  RISK FACTORS Some risk factors for high blood pressure are controllable. Others are not.  Risk factors you cannot control include:   Race. You may be at higher risk if you are African American.  Age. Risk increases with age.  Gender. Men are at higher risk than women before age 45 years. After age 65, women are at higher risk than men. Risk factors you can control include:  Not getting enough exercise or physical activity.  Being overweight.  Getting too much fat, sugar, calories, or salt in your diet.  Drinking too much alcohol. SIGNS AND SYMPTOMS Hypertension does not usually cause signs or symptoms. Extremely high blood pressure (hypertensive crisis) may cause headache, anxiety, shortness of breath, and nosebleed. DIAGNOSIS  To check if you have hypertension, your health care provider will measure your blood pressure while you are seated, with your arm held at the level of your heart. It should be measured at least twice using the same arm. Certain conditions can cause a difference in blood pressure between your right and left arms. A blood pressure reading that is higher than normal on one occasion does not mean that you need treatment. If one blood pressure reading  is high, ask your health care provider about having it checked again. TREATMENT  Treating high blood pressure includes making lifestyle changes and possibly taking medicine. Living a healthy lifestyle can help lower high blood pressure. You may need to change some of your habits. Lifestyle changes may include:  Following the DASH diet. This diet is high in fruits, vegetables, and whole grains. It is low in salt, red meat, and added sugars.  Getting at least 2 hours of brisk physical activity every week.  Losing weight if necessary.  Not smoking.  Limiting alcoholic beverages.  Learning ways to reduce stress. If lifestyle changes are not enough to get your blood pressure under control, your health care provider may prescribe medicine. You may need to take more than one. Work closely with your health care provider to understand the risks and benefits. HOME CARE INSTRUCTIONS  Have your blood pressure rechecked as directed by your health care provider.   Take medicines only as directed by your health care provider. Follow the directions carefully. Blood pressure medicines must be taken as prescribed. The medicine does not work as well when you skip doses. Skipping doses also puts you at risk for problems.   Do not smoke.   Monitor your blood pressure at home as directed by your health care provider. SEEK MEDICAL CARE IF:   You think you are having a reaction to medicines taken.  You have recurrent headaches or feel dizzy.  You have swelling in your ankles.  You have trouble with your vision. SEEK IMMEDIATE MEDICAL CARE IF:  You develop a severe headache or confusion.    You have unusual weakness, numbness, or feel faint.  You have severe chest or abdominal pain.  You vomit repeatedly.  You have trouble breathing. MAKE SURE YOU:   Understand these instructions.  Will watch your condition.  Will get help right away if you are not doing well or get worse. Document  Released: 08/18/2005 Document Revised: 01/02/2014 Document Reviewed: 06/10/2013 ExitCare Patient Information 2015 ExitCare, LLC. This information is not intended to replace advice given to you by your health care provider. Make sure you discuss any questions you have with your health care provider. DASH Eating Plan DASH stands for "Dietary Approaches to Stop Hypertension." The DASH eating plan is a healthy eating plan that has been shown to reduce high blood pressure (hypertension). Additional health benefits may include reducing the risk of type 2 diabetes mellitus, heart disease, and stroke. The DASH eating plan may also help with weight loss. WHAT DO I NEED TO KNOW ABOUT THE DASH EATING PLAN? For the DASH eating plan, you will follow these general guidelines:  Choose foods with a percent daily value for sodium of less than 5% (as listed on the food label).  Use salt-free seasonings or herbs instead of table salt or sea salt.  Check with your health care provider or pharmacist before using salt substitutes.  Eat lower-sodium products, often labeled as "lower sodium" or "no salt added."  Eat fresh foods.  Eat more vegetables, fruits, and low-fat dairy products.  Choose whole grains. Look for the word "whole" as the first word in the ingredient list.  Choose fish and skinless chicken or turkey more often than red meat. Limit fish, poultry, and meat to 6 oz (170 g) each day.  Limit sweets, desserts, sugars, and sugary drinks.  Choose heart-healthy fats.  Limit cheese to 1 oz (28 g) per day.  Eat more home-cooked food and less restaurant, buffet, and fast food.  Limit fried foods.  Cook foods using methods other than frying.  Limit canned vegetables. If you do use them, rinse them well to decrease the sodium.  When eating at a restaurant, ask that your food be prepared with less salt, or no salt if possible. WHAT FOODS CAN I EAT? Seek help from a dietitian for individual  calorie needs. Grains Whole grain or whole wheat bread. Brown rice. Whole grain or whole wheat pasta. Quinoa, bulgur, and whole grain cereals. Low-sodium cereals. Corn or whole wheat flour tortillas. Whole grain cornbread. Whole grain crackers. Low-sodium crackers. Vegetables Fresh or frozen vegetables (raw, steamed, roasted, or grilled). Low-sodium or reduced-sodium tomato and vegetable juices. Low-sodium or reduced-sodium tomato sauce and paste. Low-sodium or reduced-sodium canned vegetables.  Fruits All fresh, canned (in natural juice), or frozen fruits. Meat and Other Protein Products Ground beef (85% or leaner), grass-fed beef, or beef trimmed of fat. Skinless chicken or turkey. Ground chicken or turkey. Pork trimmed of fat. All fish and seafood. Eggs. Dried beans, peas, or lentils. Unsalted nuts and seeds. Unsalted canned beans. Dairy Low-fat dairy products, such as skim or 1% milk, 2% or reduced-fat cheeses, low-fat ricotta or cottage cheese, or plain low-fat yogurt. Low-sodium or reduced-sodium cheeses. Fats and Oils Tub margarines without trans fats. Light or reduced-fat mayonnaise and salad dressings (reduced sodium). Avocado. Safflower, olive, or canola oils. Natural peanut or almond butter. Other Unsalted popcorn and pretzels. The items listed above may not be a complete list of recommended foods or beverages. Contact your dietitian for more options. WHAT FOODS ARE NOT RECOMMENDED? Grains White bread.   White pasta. White rice. Refined cornbread. Bagels and croissants. Crackers that contain trans fat. Vegetables Creamed or fried vegetables. Vegetables in a cheese sauce. Regular canned vegetables. Regular canned tomato sauce and paste. Regular tomato and vegetable juices. Fruits Dried fruits. Canned fruit in light or heavy syrup. Fruit juice. Meat and Other Protein Products Fatty cuts of meat. Ribs, chicken wings, bacon, sausage, bologna, salami, chitterlings, fatback, hot dogs,  bratwurst, and packaged luncheon meats. Salted nuts and seeds. Canned beans with salt. Dairy Whole or 2% milk, cream, half-and-half, and cream cheese. Whole-fat or sweetened yogurt. Full-fat cheeses or blue cheese. Nondairy creamers and whipped toppings. Processed cheese, cheese spreads, or cheese curds. Condiments Onion and garlic salt, seasoned salt, table salt, and sea salt. Canned and packaged gravies. Worcestershire sauce. Tartar sauce. Barbecue sauce. Teriyaki sauce. Soy sauce, including reduced sodium. Steak sauce. Fish sauce. Oyster sauce. Cocktail sauce. Horseradish. Ketchup and mustard. Meat flavorings and tenderizers. Bouillon cubes. Hot sauce. Tabasco sauce. Marinades. Taco seasonings. Relishes. Fats and Oils Butter, stick margarine, lard, shortening, ghee, and bacon fat. Coconut, palm kernel, or palm oils. Regular salad dressings. Other Pickles and olives. Salted popcorn and pretzels. The items listed above may not be a complete list of foods and beverages to avoid. Contact your dietitian for more information. WHERE CAN I FIND MORE INFORMATION? National Heart, Lung, and Blood Institute: www.nhlbi.nih.gov/health/health-topics/topics/dash/ Document Released: 08/07/2011 Document Revised: 01/02/2014 Document Reviewed: 06/22/2013 ExitCare Patient Information 2015 ExitCare, LLC. This information is not intended to replace advice given to you by your health care provider. Make sure you discuss any questions you have with your health care provider.  

## 2014-09-20 LAB — PSA: PSA: 1.97 ng/mL (ref <=4.00)

## 2014-09-24 ENCOUNTER — Encounter: Payer: Self-pay | Admitting: *Deleted

## 2014-10-01 ENCOUNTER — Encounter: Payer: Self-pay | Admitting: Family Medicine

## 2014-10-01 DIAGNOSIS — Z719 Counseling, unspecified: Secondary | ICD-10-CM

## 2014-10-01 DIAGNOSIS — I10 Essential (primary) hypertension: Secondary | ICD-10-CM

## 2014-10-01 DIAGNOSIS — Z79899 Other long term (current) drug therapy: Secondary | ICD-10-CM

## 2014-10-06 ENCOUNTER — Ambulatory Visit (INDEPENDENT_AMBULATORY_CARE_PROVIDER_SITE_OTHER): Payer: BC Managed Care – PPO | Admitting: Family Medicine

## 2014-10-06 ENCOUNTER — Encounter: Payer: Self-pay | Admitting: Family Medicine

## 2014-10-06 VITALS — BP 140/88 | HR 67 | Temp 98.1°F | Resp 16 | Ht 71.0 in | Wt 285.8 lb

## 2014-10-06 DIAGNOSIS — Z1329 Encounter for screening for other suspected endocrine disorder: Secondary | ICD-10-CM

## 2014-10-06 DIAGNOSIS — Z2821 Immunization not carried out because of patient refusal: Secondary | ICD-10-CM

## 2014-10-06 DIAGNOSIS — G4733 Obstructive sleep apnea (adult) (pediatric): Secondary | ICD-10-CM

## 2014-10-06 DIAGNOSIS — I10 Essential (primary) hypertension: Secondary | ICD-10-CM

## 2014-10-06 DIAGNOSIS — Z0000 Encounter for general adult medical examination without abnormal findings: Secondary | ICD-10-CM

## 2014-10-06 DIAGNOSIS — R350 Frequency of micturition: Secondary | ICD-10-CM

## 2014-10-06 DIAGNOSIS — Z23 Encounter for immunization: Secondary | ICD-10-CM

## 2014-10-06 DIAGNOSIS — E785 Hyperlipidemia, unspecified: Secondary | ICD-10-CM

## 2014-10-06 LAB — LIPID PANEL
Cholesterol: 168 mg/dL (ref 0–200)
HDL: 40 mg/dL (ref >39)
LDL Cholesterol: 108 mg/dL — ABNORMAL HIGH (ref 0–99)
Total CHOL/HDL Ratio: 4.2 Ratio
Triglycerides: 101 mg/dL (ref <150)
VLDL: 20 mg/dL (ref 0–40)

## 2014-10-06 LAB — COMPLETE METABOLIC PANEL WITH GFR
BUN: 17 mg/dL (ref 6–23)
CO2: 25 mEq/L (ref 19–32)
Calcium: 9.2 mg/dL (ref 8.4–10.5)
Creat: 0.98 mg/dL (ref 0.50–1.35)
GFR, Est Non African American: 79 mL/min
Glucose, Bld: 95 mg/dL (ref 70–99)
Sodium: 140 mEq/L (ref 135–145)
Total Bilirubin: 0.6 mg/dL (ref 0.2–1.2)
Total Protein: 7 g/dL (ref 6.0–8.3)

## 2014-10-06 LAB — POCT URINALYSIS DIPSTICK
Bilirubin, UA: NEGATIVE
Blood, UA: NEGATIVE
Glucose, UA: NEGATIVE
Ketones, UA: NEGATIVE
Leukocytes, UA: NEGATIVE
Nitrite, UA: NEGATIVE
Protein, UA: NEGATIVE
Spec Grav, UA: 1.015
Urobilinogen, UA: 0.2
pH, UA: 5

## 2014-10-06 LAB — POCT UA - MICROSCOPIC ONLY
Bacteria, U Microscopic: NEGATIVE
Casts, Ur, LPF, POC: NEGATIVE
Crystals, Ur, HPF, POC: NEGATIVE
Epithelial cells, urine per micros: NEGATIVE
Mucus, UA: NEGATIVE
RBC, urine, microscopic: NEGATIVE
WBC, Ur, HPF, POC: NEGATIVE
Yeast, UA: NEGATIVE

## 2014-10-06 LAB — TSH: TSH: 1.496 u[IU]/mL (ref 0.350–4.500)

## 2014-10-06 LAB — COMPLETE METABOLIC PANEL WITHOUT GFR
ALT: 17 U/L (ref 0–53)
AST: 27 U/L (ref 0–37)
Albumin: 4.1 g/dL (ref 3.5–5.2)
Alkaline Phosphatase: 62 U/L (ref 39–117)
Chloride: 104 meq/L (ref 96–112)
GFR, Est African American: >89 mL/min
Potassium: 3.9 meq/L (ref 3.5–5.3)

## 2014-10-06 MED ORDER — ROSUVASTATIN CALCIUM 10 MG PO TABS: 10.0000 mg | ORAL_TABLET | Freq: Every day | ORAL | Status: DC

## 2014-10-06 MED ORDER — TAMSULOSIN HCL 0.4 MG PO CAPS: 0.4000 mg | ORAL_CAPSULE | Freq: Every day | ORAL | Status: DC

## 2014-10-06 NOTE — Progress Notes (Signed)
Chief Complaint:  Chief Complaint  Patient presents with  . Annual Exam  . Medication Refill    Crestor 10 mg    HPI: Gregg Smith is a 69 y.o. 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  PSA was normal last 2 weeks ago Needs Prevnar 13, already has Pneumococcal 23   Past Medical History  Diagnosis Date  . Hypertension   . Hyperlipidemia   . Arthritis   . Allergy     seasonal   . Rhinitis   . Chicken pox   . Pneumonia   . Mumps   . Leg cramps   . Measles   . Snoring disorder 05/09/2013  . Diverticulosis    Past Surgical History  Procedure Laterality Date  . Appendectomy  1957  . Tonsillectomy and adenoidectomy  1954   History   Social History  . Marital Status: Married    Spouse Name: Gregg Smith    Number of Children: 1  . Years of Education: PHD   Occupational History  .      Professor,[employer]  .  [place]   Social History Main Topics  . Smoking status: Former Research scientist (life sciences)  . Smokeless tobacco: Never Used  . Alcohol Use: 1.8 oz/week    3 Cans of beer per week     Smith: 2 beers weekly  . Drug Use: No  . Sexual Activity: Yes    Birth Control/ Protection: None   Other Topics Concern  . 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   Patient is working full-time.   Patient has a Ph.D   Patient has one adult child.   Patient is right-handed.   Patient drinks two cups of tea daily.   Family History  Problem Relation Age of Onset  . Alzheimer's disease Mother   . Cancer Father   . Aortic aneurysm Son   . Heart disease Son   . Cancer Maternal Grandmother   . Heart disease Maternal Grandfather   . Cancer Paternal Grandmother   . Cancer Paternal Grandfather     stomach   Allergies  Allergen Reactions  . Penicillins Hives and Rash     Prior to Admission medications   Medication Sig Start Date End Date Taking? Authorizing Provider  etodolac (LODINE) 400 MG tablet Take 400 mg by mouth daily as needed.    Yes Historical Provider, MD  lisinopril-hydrochlorothiazide (PRINZIDE,ZESTORETIC) 10-12.5 MG per tablet Take 1 tablet by mouth daily. 09/19/14  Yes Gregg Flaming, MD  rosuvastatin (CRESTOR) 10 MG tablet Take 1 tablet (10 mg total) by mouth daily. 02/17/14  Yes Gregg Bedore P Eloise Picone, DO     ROS: The patient denies fevers, chills, night sweats, unintentional weight loss, chest pain, palpitations, wheezing, dyspnea on exertion, nausea, vomiting, abdominal pain, dysuria, hematuria, melena, numbness, weakness, or tingling.   All other systems have been reviewed and were otherwise negative with the exception of those mentioned in the HPI and as above.    PHYSICAL EXAM: Filed Vitals:   10/06/14 0911  BP: 140/88  Pulse: 67  Temp: 98.1 F (36.7 C)  Resp: 16   Filed Vitals:   10/06/14 0911  Height: 5\' 11"  (1.803 m)  Weight: 285 lb 12.8 oz (129.638  kg)   Body mass index is 39.88 kg/(m^2).  General: Alert, no acute distress HEENT:  Normocephalic, atraumatic, oropharynx patent. EOMI, PERRLA Cardiovascular:  Regular rate and rhythm, no rubs murmurs or gallops.  No Carotid bruits, radial pulse intact. No pedal edema.  Respiratory: Clear to auscultation bilaterally.  No wheezes, rales, or rhonchi.  No cyanosis, no use of accessory musculature GI: No organomegaly, abdomen is soft and non-tender, positive bowel sounds.  No masses. Skin: No rashes. Neurologic: Facial musculature symmetric. Psychiatric: Patient is appropriate throughout our interaction. Lymphatic: No cervical lymphadenopathy Musculoskeletal: Gait antalgic, he has a knee brace on for his OA Prostate is WNL  Testicular exam-normal , neg inguinal hernia   LABS: Results for orders placed or performed in visit on 10/06/14  POCT UA - Microscopic Only  Result Value Ref  Range   WBC, Ur, HPF, POC neg    RBC, urine, microscopic neg    Bacteria, U Microscopic neg    Mucus, UA neg    Epithelial cells, urine per micros neg    Crystals, Ur, HPF, POC neg    Casts, Ur, LPF, POC neg    Yeast, UA neg   POCT urinalysis dipstick  Result Value Ref Range   Color, UA yellow    Clarity, UA clear    Glucose, UA neg    Bilirubin, UA neg    Ketones, UA neg    Spec Grav, UA 1.015    Blood, UA neg    pH, UA 5.0    Protein, UA neg    Urobilinogen, UA 0.2    Nitrite, UA neg    Leukocytes, UA Negative      EKG/XRAY:   Primary read interpreted by Gregg Smith at [facility].   ASSESSMENT/PLAN: Encounter Diagnoses  Name Primary?  . Annual physical exam Yes  . Hyperlipidemia   . Essential hypertension   . Screening for thyroid disorder   . Need for vaccination with 13-polyvalent pneumococcal conjugate vaccine   . Increased urinary frequency   . Influenza vaccination declined   . OSA (obstructive sleep apnea)     Gregg Smith is a pleasant obese white male who is a Government social research officer at [employer] , ( orginally from [place]) with a PMH of OA, HTN, hyperlipidemia who is herer for an annual. He has had increase frequency and urinary dribbling and decrease flow however PSA was normal and he does not have a very enlarged or boggy prostate--but doe shave mild BPH sxs., he has "bone on bone" pain in left knee and is not wearing knee brace, possible knee surgery pending in a couple of months.   Annual labs pending: CBC, CMP, lipid, TSH, UA He had PSA done already I will consider flomax if labs are ok Prevnar 13 given Refilled meds  F/u in 6 months   Gross sideeffects, risk and benefits, and alternatives of medications d/w patient. Patient is aware that all medications have potential sideeffects and we are unable to predict every sideeffect or drug-drug interaction that may occur.  Gregg Smith, [place], DO 10/06/2014 12:51 PM

## 2014-10-08 ENCOUNTER — Encounter: Payer: Self-pay | Admitting: Family Medicine

## 2014-10-15 NOTE — Telephone Encounter (Signed)
Ok to refill meds as requested

## 2014-10-19 MED ORDER — LISINOPRIL-HYDROCHLOROTHIAZIDE 10-12.5 MG PO TABS
1.0000 | ORAL_TABLET | Freq: Every day | ORAL | Status: DC
Start: 2014-10-19 — End: 2016-01-08

## 2014-10-19 NOTE — Telephone Encounter (Signed)
Crestor had already been refilled. Refilled lisinopril and notified pt on MyChart.

## 2014-10-30 ENCOUNTER — Ambulatory Visit (INDEPENDENT_AMBULATORY_CARE_PROVIDER_SITE_OTHER): Payer: BC Managed Care – PPO | Admitting: Emergency Medicine

## 2014-10-30 ENCOUNTER — Ambulatory Visit (INDEPENDENT_AMBULATORY_CARE_PROVIDER_SITE_OTHER): Payer: BC Managed Care – PPO

## 2014-10-30 VITALS — BP 132/82 | HR 83 | Temp 97.7°F | Resp 16 | Ht 71.5 in | Wt 278.4 lb

## 2014-10-30 DIAGNOSIS — G8929 Other chronic pain: Secondary | ICD-10-CM

## 2014-10-30 DIAGNOSIS — R1013 Epigastric pain: Secondary | ICD-10-CM

## 2014-10-30 DIAGNOSIS — K219 Gastro-esophageal reflux disease without esophagitis: Secondary | ICD-10-CM

## 2014-10-30 DIAGNOSIS — G4733 Obstructive sleep apnea (adult) (pediatric): Secondary | ICD-10-CM

## 2014-10-30 LAB — POCT CBC
GRANULOCYTE PERCENT: 83.4 % — AB (ref 37–80)
HEMATOCRIT: 46.3 % (ref 43.5–53.7)
Hemoglobin: 15.2 g/dL (ref 14.1–18.1)
Lymph, poc: 1.2 (ref 0.6–3.4)
MCH: 28.7 pg (ref 27–31.2)
MCHC: 32.9 g/dL (ref 31.8–35.4)
MCV: 87.1 fL (ref 80–97)
MID (CBC): 0.1 (ref 0–0.9)
MPV: 7.9 fL (ref 0–99.8)
POC Granulocyte: 6.7 (ref 2–6.9)
POC LYMPH %: 15 % (ref 10–50)
POC MID %: 1.6 % (ref 0–12)
Platelet Count, POC: 209 10*3/uL (ref 142–424)
RBC: 5.32 M/uL (ref 4.69–6.13)
RDW, POC: 14.5 %
WBC: 8 10*3/uL (ref 4.6–10.2)

## 2014-10-30 MED ORDER — LANSOPRAZOLE 30 MG PO CPDR: 30.0000 mg | DELAYED_RELEASE_CAPSULE | Freq: Every day | ORAL | Status: DC

## 2014-10-30 MED ORDER — SUCRALFATE 1 G PO TABS: ORAL_TABLET | ORAL | Status: DC

## 2014-10-30 NOTE — Patient Instructions (Signed)
Gastroesophageal Reflux Disease, Adult Gastroesophageal reflux disease (GERD) happens when acid from your stomach flows up into the esophagus. When acid comes in contact with the esophagus, the acid causes soreness (inflammation) in the esophagus. Over time, GERD may create small holes (ulcers) in the lining of the esophagus. CAUSES   Increased body weight. This puts pressure on the stomach, making acid rise from the stomach into the esophagus.  Smoking. This increases acid production in the stomach.  Drinking alcohol. This causes decreased pressure in the lower esophageal sphincter (valve or ring of muscle between the esophagus and stomach), allowing acid from the stomach into the esophagus.  Late evening meals and a full stomach. This increases pressure and acid production in the stomach.  A malformed lower esophageal sphincter. Sometimes, no cause is found. SYMPTOMS   Burning pain in the lower part of the mid-chest behind the breastbone and in the mid-stomach area. This may occur twice a week or more often.  Trouble swallowing.  Sore throat.  Dry cough.  Asthma-like symptoms including chest tightness, shortness of breath, or wheezing. DIAGNOSIS  Your caregiver may be able to diagnose GERD based on your symptoms. In some cases, X-rays and other tests may be done to check for complications or to check the condition of your stomach and esophagus. TREATMENT  Your caregiver may recommend over-the-counter or prescription medicines to help decrease acid production. Ask your caregiver before starting or adding any new medicines.  HOME CARE INSTRUCTIONS   Change the factors that you can control. Ask your caregiver for guidance concerning weight loss, quitting smoking, and alcohol consumption.  Avoid foods and drinks that make your symptoms worse, such as:  Caffeine or alcoholic drinks.  Chocolate.  Peppermint or mint flavorings.  Garlic and onions.  Spicy foods.  Citrus fruits,  such as oranges, lemons, or limes.  Tomato-based foods such as sauce, chili, salsa, and pizza.  Fried and fatty foods.  Avoid lying down for the 3 hours prior to your bedtime or prior to taking a nap.  Eat small, frequent meals instead of large meals.  Wear loose-fitting clothing. Do not wear anything tight around your waist that causes pressure on your stomach.  Raise the head of your bed 6 to 8 inches with wood blocks to help you sleep. Extra pillows will not help.  Only take over-the-counter or prescription medicines for pain, discomfort, or fever as directed by your caregiver.  Do not take aspirin, ibuprofen, or other nonsteroidal anti-inflammatory drugs (NSAIDs). SEEK IMMEDIATE MEDICAL CARE IF:   You have pain in your arms, neck, jaw, teeth, or back.  Your pain increases or changes in intensity or duration.  You develop nausea, vomiting, or sweating (diaphoresis).  You develop shortness of breath, or you faint.  Your vomit is green, yellow, black, or looks like coffee grounds or blood.  Your stool is red, bloody, or black. These symptoms could be signs of other problems, such as heart disease, gastric bleeding, or esophageal bleeding. MAKE SURE YOU:   Understand these instructions.  Will watch your condition.  Will get help right away if you are not doing well or get worse. Document Released: 05/28/2005 Document Revised: 11/10/2011 Document Reviewed: 03/07/2011 ExitCare Patient Information 2015 [employer]. This information is not intended to replace advice given to you by your health care provider. Make sure you discuss any questions you have with your health care provider.  

## 2014-10-30 NOTE — Progress Notes (Signed)
[facility] [address], [place] [address] [phone]  Date:  10/30/2014   Name:  Gregg Smith   DOB:  1946-08-01   MRN:  [id number]  PCP:  Kennon Portela, MD    Chief Complaint: Bloated; Nausea; Emesis; Fatigue; and Generalized Body Aches   History of Present Illness:  Gregg Smith is a 69 y.o. very pleasant male patient who presents with the following:  Patient with periumbilical bloating and discomfort.  Worsening GERD. Waterbrash, heartburn. Now nausea and belching.  Some vomiting.  No bloody emesis The patient has no complaint of blood, mucous, or pus in her stools. Generalized malaise and myalgias No excess ETOH or NSAID Nonsmoker No improvement with over the counter medications or other home remedies. Denies other complaint or health concern today.   Patient Active Problem List   Diagnosis Date Noted  . OSA (obstructive sleep apnea) 03/22/2014  . UARS (upper airway resistance syndrome) 03/22/2014  . Snoring disorder 05/09/2013  . Unspecified essential hypertension 02/11/2013  . Other and unspecified hyperlipidemia 02/11/2013  . Arthritis 02/11/2013    Past Medical History  Diagnosis Date  . Hypertension   . Hyperlipidemia   . Arthritis   . Allergy     seasonal   . Rhinitis   . Chicken pox   . Pneumonia   . Mumps   . Leg cramps   . Measles   . Snoring disorder 05/09/2013  . Diverticulosis     Past Surgical History  Procedure Laterality Date  . Appendectomy  1957  . Tonsillectomy and adenoidectomy  1954    History  Substance Use Topics  . Smoking status: Former Research scientist (life sciences)  . Smokeless tobacco: Never Used  . Alcohol Use: 1.8 oz/week    3 Cans of beer per week     Comment: 2 beers weekly    Family History  Problem Relation Age of Onset  . Alzheimer's disease Mother   . Cancer Father   . Aortic aneurysm Son   . Heart disease Son   . Cancer Maternal Grandmother   . Heart disease Maternal Grandfather    . Cancer Paternal Grandmother   . Cancer Paternal Grandfather     stomach    Allergies  Allergen Reactions  . Penicillins Hives and Rash    Medication list has been reviewed and updated.  Current Outpatient Prescriptions on File Prior to Visit  Medication Sig Dispense Refill  . etodolac (LODINE) 400 MG tablet Take 400 mg by mouth daily as needed.     Marland Kitchen lisinopril-hydrochlorothiazide (PRINZIDE,ZESTORETIC) 10-12.5 MG per tablet Take 1 tablet by mouth daily. 90 tablet 1  . rosuvastatin (CRESTOR) 10 MG tablet Take 1 tablet (10 mg total) by mouth daily. 90 tablet 3  . tamsulosin (FLOMAX) 0.4 MG CAPS capsule Take 1 capsule (0.4 mg total) by mouth daily. For urinary issues, monitor for dizziness, if works then call back for refills 30 capsule 0   No current facility-administered medications on file prior to visit.    Review of Systems:  As per HPI, otherwise negative.    Physical Examination: Filed Vitals:   10/30/14 1426  BP: 132/82  Pulse: 83  Temp: 97.7 F (36.5 C)  Resp: 16   Filed Vitals:   10/30/14 1426  Height: 5' 11.5" (1.816 m)  Weight: 278 lb 6.4 oz (126.281 kg)   Body mass index is 38.29 kg/(m^2). Ideal Body Weight: Weight in (lb) to have BMI = 25: 181.4  GEN: WDWN, NAD, Non-toxic, A & O x 3 HEENT: Atraumatic, Normocephalic. Neck supple. No masses, No LAD. Ears and Nose: No external deformity. CV: RRR, No M/G/R. No JVD. No thrill. No extra heart sounds. PULM: CTA B, no wheezes, crackles, rhonchi. No retractions. No resp. distress. No accessory muscle use. ABD: S, NT, ND, +BS. No rebound. No HSM. EXTR: No c/c/e NEURO Normal gait.  PSYCH: Normally interactive. Conversant. Not depressed or anxious appearing.  Calm demeanor.    Assessment and Plan: GERD OSA meds Labs pending  Signed,  Ellison Carwin, MD   Results for orders placed or performed in visit on 10/30/14  POCT CBC  Result Value Ref Range   WBC 8.0 4.6 - 10.2 K/uL   Lymph, poc 1.2 0.6  - 3.4   POC LYMPH PERCENT 15.0 10 - 50 %L   MID (cbc) 0.1 0 - 0.9   POC MID % 1.6 0 - 12 %M   POC Granulocyte 6.7 2 - 6.9   Granulocyte percent 83.4 (A) 37 - 80 %G   RBC 5.32 4.69 - 6.13 M/uL   Hemoglobin 15.2 14.1 - 18.1 g/dL   HCT, POC 46.3 43.5 - 53.7 %   MCV 87.1 80 - 97 fL   MCH, POC 28.7 27 - 31.2 pg   MCHC 32.9 31.8 - 35.4 g/dL   RDW, POC 14.5 %   Platelet Count, POC 209 142 - 424 K/uL   MPV 7.9 0 - 99.8 fL     [facility] reading (PRIMARY) by  Dr. Ouida Sills.  Negative AAS.

## 2014-10-31 ENCOUNTER — Encounter: Payer: Self-pay | Admitting: Internal Medicine

## 2014-10-31 LAB — COMPREHENSIVE METABOLIC PANEL
ALT: 15 U/L (ref 0–53)
AST: 23 U/L (ref 0–37)
Albumin: 3.9 g/dL (ref 3.5–5.2)
Alkaline Phosphatase: 67 U/L (ref 39–117)
BUN: 15 mg/dL (ref 6–23)
CALCIUM: 9 mg/dL (ref 8.4–10.5)
CO2: 26 meq/L (ref 19–32)
CREATININE: 1.03 mg/dL (ref 0.50–1.35)
Chloride: 101 mEq/L (ref 96–112)
Glucose, Bld: 91 mg/dL (ref 70–99)
POTASSIUM: 4.1 meq/L (ref 3.5–5.3)
Sodium: 140 mEq/L (ref 135–145)
Total Bilirubin: 0.7 mg/dL (ref 0.2–1.2)
Total Protein: 7 g/dL (ref 6.0–8.3)

## 2014-10-31 LAB — LIPASE: LIPASE: 30 U/L (ref 0–75)

## 2014-10-31 LAB — AMYLASE: AMYLASE: 34 U/L (ref 0–105)

## 2014-11-08 ENCOUNTER — Other Ambulatory Visit: Payer: Self-pay | Admitting: Family Medicine

## 2014-11-08 NOTE — Telephone Encounter (Signed)
Dr Marin Comment, do you want to give pt more RFs?

## 2014-11-20 ENCOUNTER — Other Ambulatory Visit: Payer: Self-pay | Admitting: Family Medicine

## 2014-11-20 ENCOUNTER — Encounter: Payer: Self-pay | Admitting: Family Medicine

## 2014-12-04 ENCOUNTER — Other Ambulatory Visit: Payer: Self-pay | Admitting: Emergency Medicine

## 2014-12-05 NOTE — Telephone Encounter (Signed)
Dr Ouida Sills, do you want to give pt RFs?

## 2014-12-19 ENCOUNTER — Encounter: Payer: Self-pay | Admitting: Internal Medicine

## 2014-12-19 ENCOUNTER — Ambulatory Visit (INDEPENDENT_AMBULATORY_CARE_PROVIDER_SITE_OTHER): Payer: BC Managed Care – PPO | Admitting: Internal Medicine

## 2014-12-19 ENCOUNTER — Telehealth: Payer: Self-pay

## 2014-12-19 VITALS — BP 132/88 | HR 72 | Ht 71.0 in | Wt 291.4 lb

## 2014-12-19 DIAGNOSIS — R1314 Dysphagia, pharyngoesophageal phase: Secondary | ICD-10-CM | POA: Diagnosis not present

## 2014-12-19 DIAGNOSIS — K219 Gastro-esophageal reflux disease without esophagitis: Secondary | ICD-10-CM | POA: Diagnosis not present

## 2014-12-19 DIAGNOSIS — K5730 Diverticulosis of large intestine without perforation or abscess without bleeding: Secondary | ICD-10-CM | POA: Diagnosis not present

## 2014-12-19 NOTE — Patient Instructions (Signed)

## 2014-12-19 NOTE — Telephone Encounter (Signed)
-----   Message from Irene Shipper, MD sent at 12/18/2014  8:47 PM EDT ----- That's fine. ----- Message -----    From: Audrea Muscat, CMA    Sent: 12/15/2014  10:26 AM      To: Irene Shipper, MD  Wanted to give you a heads up on this - I conferred with [facility] and she just said to tell you.  This is a new patient on your schedule for next Tuesday who has a history with Dr. Fuller Plan.  He did a colon on her 09/12/2004 but he apparently left Dr. Fuller Plan because there is a procedure in epic for a colon done by Dr. Benson Norway in 2013.  Do you still want to see them?

## 2014-12-19 NOTE — Progress Notes (Signed)
HISTORY OF PRESENT ILLNESS:  Gregg Smith is a 69 y.o. 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 Ellison Carwin regarding chronic GERD and the need for upper endoscopy. Outside records including office evaluation, laboratories, and x-rays have been reviewed. The patient reports a 20 year history of chronic problems with pyrosis and regurgitation. Occasional mild solid food dysphagia. He has been self-medicating over the years with antacids. At time of his evaluation in February he was placed on lansoprazole 30 mg daily. On medication, complete resolution of symptoms. There is no family history of Barrett's esophagus or esophageal cancer. The patient has been chronically obese. He uses modest alcohol regularly and is a reformed smoker. CBC and comprehensive metabolic panel in February were unremarkable. Abdominal series negative. He did undergo colonoscopy in January 2006 with Dr. Fuller Plan. He was found to have diverticulosis and internal hemorrhoids. A diminutive colon polyp was removed without pathology. Repeat colonoscopy with Dr. Carol Ada 06/17/2012 again revealed diverticulosis and hemorrhoids. A diminutive 3 mm rectosigmoid colon polyp was removed and found to be hyperplastic.  REVIEW OF SYSTEMS:  All non-GI ROS negative except for arthritis, sinus and allergy,  Past Medical History  Diagnosis Date  . Hypertension   . Hyperlipidemia   . Arthritis   . Allergy     seasonal   . Rhinitis   . Chicken pox   . Pneumonia   . Mumps   . Leg cramps   . Measles   . Snoring disorder 05/09/2013  . Diverticulosis   . GERD (gastroesophageal reflux disease)   . Colon polyps     hyperplastic  . Hemorrhoids     Past Surgical History  Procedure Laterality Date  . Appendectomy  1957  . Tonsillectomy and adenoidectomy  1954    Social History [name]  reports that he has quit smoking. He has  never used smokeless tobacco. He reports that he drinks about 1.8 oz of alcohol per week. He reports that he does not use illicit drugs.  family history includes Alzheimer's disease in his mother; Aortic aneurysm in his son; Cancer in his father, maternal grandmother, paternal grandfather, and paternal grandmother; Heart disease in his maternal grandfather and son.  Allergies  Allergen Reactions  . Penicillins Hives and Rash       PHYSICAL EXAMINATION: Vital signs: BP 132/88 mmHg  Pulse 72  Ht 5\' 11"  (1.803 m)  Wt 291 lb 6.4 oz (132.178 kg)  BMI 40.66 kg/m2 General: Well-developed, well-nourished, no acute distress HEENT: Sclerae are anicteric, conjunctiva pink. Oral mucosa intact Lungs: Clear Heart: Regular Abdomen: soft, nontender, nondistended, no obvious ascites, no peritoneal signs, normal bowel sounds. No organomegaly. Extremities: No edema Psychiatric: alert and oriented x3. Cooperative   ASSESSMENT:  #1. Chronic GERD. Symptoms improved with once daily lansoprazole #2. Mild intermittent dysphagia. Rule out peptic stricture #3. Previous colonoscopy in 2006 and 2013 as described. Diminutive rectosigmoid hyperplastic polyp and diverticulosis with internal hemorrhoids on most recent exam #4. Multiple medical problems   PLAN:  #1. Reflux precautions with attention to weight loss. I also provided him with literature on GERD as well as endoscopy (see below) #2. Upper endoscopy to rule out Barrett's esophagus and evaluate mild dysphagia. Consider esophageal dilation. Discussed.The nature of the procedure, as well as the risks, benefits, and alternatives were carefully and thoroughly reviewed with the patient. Ample time for discussion and questions allowed. The patient  understood, was satisfied, and agreed to proceed. #3. Continue lansoprazole once daily. Optimal timing to take medication discussed. #4. Routine repeat screening colonoscopy around 2023  A copy of this  consultation at has been sent to Dr. Ouida Sills

## 2014-12-21 ENCOUNTER — Encounter: Payer: Self-pay | Admitting: Gastroenterology

## 2014-12-28 ENCOUNTER — Encounter: Payer: Self-pay | Admitting: Internal Medicine

## 2015-01-02 ENCOUNTER — Ambulatory Visit (AMBULATORY_SURGERY_CENTER): Payer: BC Managed Care – PPO | Admitting: Internal Medicine

## 2015-01-02 ENCOUNTER — Encounter: Payer: Self-pay | Admitting: Internal Medicine

## 2015-01-02 VITALS — BP 139/86 | HR 60 | Temp 96.9°F | Resp 19 | Ht 71.0 in | Wt 291.0 lb

## 2015-01-02 DIAGNOSIS — K3189 Other diseases of stomach and duodenum: Secondary | ICD-10-CM

## 2015-01-02 DIAGNOSIS — K2270 Barrett's esophagus without dysplasia: Secondary | ICD-10-CM | POA: Diagnosis not present

## 2015-01-02 DIAGNOSIS — K219 Gastro-esophageal reflux disease without esophagitis: Secondary | ICD-10-CM

## 2015-01-02 DIAGNOSIS — R1314 Dysphagia, pharyngoesophageal phase: Secondary | ICD-10-CM

## 2015-01-02 MED ORDER — SODIUM CHLORIDE 0.9 % IV SOLN: 500.0000 mL | INTRAVENOUS | Status: DC

## 2015-01-02 NOTE — Op Note (Signed)
[facility]  [address] [place], [address]   ENDOSCOPY PROCEDURE REPORT  PATIENT: Gregg, Smith  MR#: [id number] BIRTHDATE: 03/04/1946 , 68  yrs. old GENDER: male ENDOSCOPIST: Eustace Quail, MD REFERRED BY:  .  Self / Office PROCEDURE DATE:  01/02/2015 PROCEDURE:  EGD w/ biopsy ASA CLASS:     Class II INDICATIONS:  history of esophageal reflux and dysphagia. MEDICATIONS: Monitored anesthesia care, Propofol 200 mg IV, and Lidocaine 200 mg IV TOPICAL ANESTHETIC: none  DESCRIPTION OF PROCEDURE: After the risks benefits and alternatives of the procedure were thoroughly explained, informed consent was obtained.  The Pentax Gastroscope [id number] endoscope was introduced through the mouth and advanced to the second portion of the duodenum , Without limitations.  The instrument was slowly withdrawn as the mucosa was fully examined.  The esophagus revealed normal mucosa without inflammation or Barrett's.  No obvious stricture.  There was a benign inflammatory appearing nodule measuring 6 mm on the gastric side of the GE junction.  This was biopsied.  Stomach was normal.  The duodenum was normal.  Retroflexed views revealed a hiatal hernia.     The scope was then withdrawn from the patient and the procedure completed.  COMPLICATIONS: There were no immediate complications.  ENDOSCOPIC IMPRESSION: 1. GERD excellent 2. Incidental gastroesophageal nodule. Otherwise normal EGD  RECOMMENDATIONS: 1.  Anti-reflux regimen to be followed 2.  Continue lansoprazole for symptomatic acid reflux 3. Follow-up biopsies. Dr. Henrene Pastor will send you a letter with the results  REPEAT EXAM:  eSigned:  Eustace Quail, MD 01/02/2015 3:17 PM    WN:IOEVO Guest, MD and The Patient

## 2015-01-02 NOTE — Progress Notes (Signed)
Report to PACU, RN, vss, BBS= Clear.  

## 2015-01-02 NOTE — Progress Notes (Signed)
Called to room to assist during endoscopic procedure.  Patient ID and intended procedure confirmed with present staff. Received instructions for my participation in the procedure from the performing physician.  

## 2015-01-02 NOTE — Patient Instructions (Signed)
YOU HAD AN ENDOSCOPIC PROCEDURE TODAY AT [facility]:   Refer to the procedure report that was given to you for any specific questions about what was found during the examination.  If the procedure report does not answer your questions, please call your gastroenterologist to clarify.  If you requested that your care partner not be given the details of your procedure findings, then the procedure report has been included in a sealed envelope for you to review at your convenience later.  YOU SHOULD EXPECT: Some feelings of bloating in the abdomen. Passage of more gas than usual.  Walking can help get rid of the air that was put into your GI tract during the procedure and reduce the bloating. If you had a lower endoscopy (such as a colonoscopy or flexible sigmoidoscopy) you may notice spotting of blood in your stool or on the toilet paper. If you underwent a bowel prep for your procedure, you may not have a normal bowel movement for a few days.  Please Note:  You might notice some irritation and congestion in your nose or some drainage.  This is from the oxygen used during your procedure.  There is no need for concern and it should clear up in a day or so.  SYMPTOMS TO REPORT IMMEDIATELY:      Following upper endoscopy (EGD)  Vomiting of blood or coffee ground material  New chest pain or pain under the shoulder blades  Painful or persistently difficult swallowing  New shortness of breath  Fever of 100F or higher  Black, tarry-looking stools  For urgent or emergent issues, a gastroenterologist can be reached at any hour by calling [phone].   DIET: Your first meal following the procedure should be a small meal and then it is ok to progress to your normal diet. Heavy or fried foods are harder to digest and may make you feel nauseous or bloated.  Likewise, meals heavy in dairy and vegetables can increase bloating.  Drink plenty of fluids but you should avoid alcoholic beverages  for 24 hours.  ACTIVITY:  You should plan to take it easy for the rest of today and you should NOT DRIVE or use heavy machinery until tomorrow (because of the sedation medicines used during the test).    FOLLOW UP: Our staff will call the number listed on your records the next business day following your procedure to check on you and address any questions or concerns that you may have regarding the information given to you following your procedure. If we do not reach you, we will leave a message.  However, if you are feeling well and you are not experiencing any problems, there is no need to return our call.  We will assume that you have returned to your regular daily activities without incident.  If any biopsies were taken you will be contacted by phone or by letter within the next 1-3 weeks.  Please call us at [phone] if you have not heard about the biopsies in 3 weeks.    SIGNATURES/CONFIDENTIALITY: You and/or your care partner have signed paperwork which will be entered into your electronic medical record.  These signatures attest to the fact that that the information above on your After Visit Summary has been reviewed and is understood.  Full responsibility of the confidentiality of this discharge information lies with you and/or your care-partner.   Resume medications. Information given on [facility] with discharge instructions.

## 2015-01-03 ENCOUNTER — Telehealth: Payer: Self-pay | Admitting: *Deleted

## 2015-01-03 NOTE — Telephone Encounter (Signed)
  Follow up Call-  Call back number 01/02/2015  Post procedure Call Back phone  # [phone]  Permission to leave phone message Yes     Patient questions:  Do you have a fever, pain , or abdominal swelling? No. Pain Score  0 *  Have you tolerated food without any problems? Yes.    Have you been able to return to your normal activities? Yes.    Do you have any questions about your discharge instructions: Diet   No. Medications  No. Follow up visit  No.  Do you have questions or concerns about your Care? No.  Actions: * If pain score is 4 or above: No action needed, pain <4.

## 2015-01-09 ENCOUNTER — Encounter: Payer: Self-pay | Admitting: Internal Medicine

## 2015-01-10 ENCOUNTER — Telehealth: Payer: Self-pay | Admitting: Family Medicine

## 2015-01-10 NOTE — Telephone Encounter (Signed)
Wife states that her husband will call back to reschedule since its so far out for an appt i advised her that its his 6 month follow up

## 2015-02-08 ENCOUNTER — Telehealth: Payer: Self-pay

## 2015-02-08 NOTE — Telephone Encounter (Signed)
PA needed for lansoprazole. Completed covermymeds. Pending.

## 2015-02-16 NOTE — Telephone Encounter (Signed)
PA approved from 01/09/15 - 02/08/16, case # [id number], BUT EXP SCRIPTS COULD NOT GET SETTING CHANGED IN ORDER TO ALLOW MED TO BE RUN THROUGH. They are sending to [employer] to fix and should be able to be processed by tomorrow or Monday. I sent pharm this info.

## 2015-02-19 ENCOUNTER — Encounter: Payer: Self-pay | Admitting: Family Medicine

## 2015-02-21 NOTE — Telephone Encounter (Signed)
Called to check status of [employer]'s fixing the problem so pt can get his medication. Was advised that problem has been resolved. Contacted pharm and they are able to get Rx to go through now and will notify pt when ready.

## 2015-04-06 ENCOUNTER — Ambulatory Visit: Payer: BC Managed Care – PPO | Admitting: Family Medicine

## 2015-04-10 ENCOUNTER — Encounter: Payer: Self-pay | Admitting: Family Medicine

## 2015-04-24 ENCOUNTER — Other Ambulatory Visit: Payer: Self-pay | Admitting: Family Medicine

## 2015-04-26 ENCOUNTER — Encounter: Payer: Self-pay | Admitting: Family Medicine

## 2015-04-27 ENCOUNTER — Other Ambulatory Visit: Payer: Self-pay | Admitting: Family Medicine

## 2015-04-27 MED ORDER — TAMSULOSIN HCL 0.4 MG PO CAPS: ORAL_CAPSULE | ORAL | Status: DC

## 2015-05-26 ENCOUNTER — Other Ambulatory Visit: Payer: Self-pay | Admitting: Emergency Medicine

## 2015-06-28 ENCOUNTER — Other Ambulatory Visit: Payer: Self-pay | Admitting: Emergency Medicine

## 2015-06-29 ENCOUNTER — Other Ambulatory Visit: Payer: Self-pay | Admitting: Internal Medicine

## 2015-07-07 ENCOUNTER — Other Ambulatory Visit: Payer: Self-pay | Admitting: Physician Assistant

## 2015-07-08 ENCOUNTER — Encounter: Payer: Self-pay | Admitting: Family Medicine

## 2015-07-10 ENCOUNTER — Telehealth: Payer: Self-pay

## 2015-07-10 MED ORDER — SUCRALFATE 1 G PO TABS: ORAL_TABLET | ORAL | Status: DC

## 2015-07-10 MED ORDER — LANSOPRAZOLE 30 MG PO CPDR: DELAYED_RELEASE_CAPSULE | ORAL | Status: DC

## 2015-07-10 NOTE — Telephone Encounter (Signed)
Pharm stated that pt just picked up a 30 day RF of lansoprazole, but Rx has not RFs on it. They do need a new Rx for sucralfate. I will send in Rxs of both for 1 yr as instr'd by Dr Marin Comment. Notified pt on MyChart.

## 2015-07-10 NOTE — Telephone Encounter (Signed)
-----   Message from Glenford Bayley, DO sent at 07/09/2015  5:19 PM EST ----- Regarding: Prior Howell Pringle-  Can you look into this for me. IF he needs refills only then he can have one year's worth, if he needs prior auth then can you do it  Thanks!! Dr Marin Comment   Hello Dr. Marin Comment,    My pharmacy ([facility] in [place]) needs approval for the following prescriptions:   Sucralfate 1 gm tablet   Lansoprazole DR 30 mg capsule

## 2015-08-22 ENCOUNTER — Encounter: Payer: Self-pay | Admitting: Family Medicine

## 2015-09-03 MED ORDER — TAMSULOSIN HCL 0.4 MG PO CAPS: ORAL_CAPSULE | ORAL | Status: DC

## 2015-09-03 NOTE — Telephone Encounter (Signed)
Refilled Flomax Rx as requested by pharm, and answered pt's previous question about Dr Gus Puma hours this week on mychart.

## 2015-09-05 ENCOUNTER — Ambulatory Visit (INDEPENDENT_AMBULATORY_CARE_PROVIDER_SITE_OTHER): Payer: BC Managed Care – PPO | Admitting: Physician Assistant

## 2015-09-05 VITALS — BP 132/70 | HR 69 | Temp 98.1°F | Resp 14 | Ht 71.25 in | Wt 278.0 lb

## 2015-09-05 DIAGNOSIS — Z139 Encounter for screening, unspecified: Secondary | ICD-10-CM

## 2015-09-05 DIAGNOSIS — Z0000 Encounter for general adult medical examination without abnormal findings: Secondary | ICD-10-CM | POA: Diagnosis not present

## 2015-09-05 NOTE — Progress Notes (Signed)
09/05/2015 6:18 PM   DOB: [birth year] / MRN: [id number]  SUBJECTIVE:  Gregg Smith is a 70 y.o. male presenting for an annual physical. Last colonoscopy was in 2013 and one sessile polyp was found.  It was advised that he return in 5-10 years.  He has been receiving serial PSA's and these have been WNL.  He has a history of dyslipidemia and has been taking rosuvastatin for this.  His flu, tdap, and pneumovaccinations are up today.   He denies depression and anhedonia.    Depression screen PHQ 2/9 09/05/2015  Decreased Interest 0  Down, Depressed, Hopeless 0  PHQ - 2 Score 0   Immunization History  Administered Date(s) Administered  . Influenza-Unspecified 06/01/2014, 07/07/2015  . Pneumococcal Conjugate-13 10/06/2014  . Pneumococcal Polysaccharide-23 06/18/2007  . Tdap 04/14/2012, 08/14/2012  . Zoster 08/17/2008    He is allergic to penicillins.   He  has a past medical history of Hypertension; Hyperlipidemia; Arthritis; Allergy; Rhinitis; Chicken pox; Pneumonia; Mumps; Leg cramps; Measles; Snoring disorder (05/09/2013); Diverticulosis; GERD (gastroesophageal reflux disease); Colon polyps; and Hemorrhoids.    He  reports that he has quit smoking. He has never used smokeless tobacco. He reports that he drinks about 1.8 oz of alcohol per week. He reports that he does not use illicit drugs. He  reports that he currently engages in sexual activity. He reports using the following method of birth control/protection: None. The patient  has past surgical history that includes Appendectomy [id number]) and Tonsillectomy and adenoidectomy (1954).  His family history includes Alzheimer's disease in his mother; Aortic aneurysm in his son; Cancer in his father, maternal grandmother, paternal grandfather, and paternal grandmother; Heart disease in his maternal grandfather and son.  Review of Systems  Constitutional: Negative for fever and chills.  Eyes: Negative for blurred vision.  Respiratory:  Negative for cough and shortness of breath.   Cardiovascular: Negative for chest pain.  Gastrointestinal: Negative for nausea and abdominal pain.  Genitourinary: Negative for dysuria, urgency and frequency.  Musculoskeletal: Negative for myalgias.  Skin: Negative for rash.  Neurological: Negative for dizziness, tingling and headaches.  Psychiatric/Behavioral: Negative for depression. The patient is not nervous/anxious.     Problem list and medications reviewed and updated by myself where necessary, and exist elsewhere in the encounter.   OBJECTIVE:  BP 132/70 mmHg  Pulse 69  Temp(Src) 98.1 F (36.7 C) (Oral)  Resp 14  Ht 5' 11.25" (1.81 m)  Wt 278 lb (126.1 kg)  BMI 38.49 kg/m2  SpO2 93%  Physical Exam  Constitutional: He is oriented to person, place, and time. He appears well-developed. He does not appear ill.  Eyes: Conjunctivae and EOM are normal. Pupils are equal, round, and reactive to light.  Cardiovascular: Normal rate.   Pulmonary/Chest: Effort normal.  Abdominal: He exhibits no distension.  Musculoskeletal: Normal range of motion.  Neurological: He is alert and oriented to person, place, and time. No cranial nerve deficit. Coordination normal.  Skin: Skin is warm and dry. He is not diaphoretic.  Psychiatric: He has a normal mood and affect.  Nursing note and vitals reviewed.    Results for orders placed or performed in visit on 09/05/15  CBC  Result Value Ref Range   WBC 8.1 4.0 - 10.5 K/uL   RBC 5.21 4.22 - 5.81 MIL/uL   Hemoglobin 15.2 13.0 - 17.0 g/dL   HCT 44.5 39.0 - 52.0 %   MCV 85.4 78.0 - 100.0 fL   MCH 29.2 26.0 -  34.0 pg   MCHC 34.2 30.0 - 36.0 g/dL   RDW 14.7 11.5 - 15.5 %   Platelets 251 150 - 400 K/uL   MPV 10.7 8.6 - 12.4 fL  COMPLETE METABOLIC PANEL WITH GFR  Result Value Ref Range   Sodium 140 135 - 146 mmol/L   Potassium 4.2 3.5 - 5.3 mmol/L   Chloride 107 98 - 110 mmol/L   CO2 27 20 - 31 mmol/L   Glucose, Bld 89 65 - 99 mg/dL   BUN  21 7 - 25 mg/dL   Creat 1.04 0.70 - 1.25 mg/dL   Total Bilirubin 0.5 0.2 - 1.2 mg/dL   Alkaline Phosphatase 67 40 - 115 U/L   AST 28 10 - 35 U/L   ALT 20 9 - 46 U/L   Total Protein 7.0 6.1 - 8.1 g/dL   Albumin 4.0 3.6 - 5.1 g/dL   Calcium 9.1 8.6 - 10.3 mg/dL   GFR, Est African American 84 >=60 mL/min   GFR, Est Non African American 73 >=60 mL/min  Lipid panel  Result Value Ref Range   Cholesterol 153 125 - 200 mg/dL   Triglycerides 136 <150 mg/dL   HDL 39 (L) >=40 mg/dL   Total CHOL/HDL Ratio 3.9 <=5.0 Ratio   VLDL 27 <30 mg/dL   LDL Cholesterol 87 <130 mg/dL  TSH  Result Value Ref Range   TSH 1.573 0.350 - 4.500 uIU/mL  Hemoglobin A1c  Result Value Ref Range   Hgb A1c MFr Bld 5.5 <5.7 %   Mean Plasma Glucose 111 <117 mg/dL  Hepatitis C antibody  Result Value Ref Range   HCV Ab NEGATIVE NEGATIVE     ASSESSMENT AND PLAN  Gregg Smith was seen today for annual exam.  Diagnoses and all orders for this visit:  Annual physical exam: He is doing very well and has no needs at this time.  Screening all negative.  PSA not measured after discussion of the risk vs. benefit.  Will see him back in a year or sooner as needed.    Screening -     CBC -     COMPLETE METABOLIC PANEL WITH GFR -     Lipid panel -     TSH -     Hemoglobin A1c -     Hepatitis C antibody   The patient was advised to call or return to clinic if he does not see an improvement in symptoms or to seek the care of the closest emergency department if he worsens with the above plan.   Gregg Smith, MHS, PA-C [facility] 09/05/2015 6:18 PM

## 2015-09-06 LAB — COMPLETE METABOLIC PANEL WITH GFR
ALBUMIN: 4 g/dL (ref 3.6–5.1)
ALK PHOS: 67 U/L (ref 40–115)
ALT: 20 U/L (ref 9–46)
AST: 28 U/L (ref 10–35)
BUN: 21 mg/dL (ref 7–25)
CALCIUM: 9.1 mg/dL (ref 8.6–10.3)
CO2: 27 mmol/L (ref 20–31)
Chloride: 107 mmol/L (ref 98–110)
Creat: 1.04 mg/dL (ref 0.70–1.25)
GFR, EST AFRICAN AMERICAN: 84 mL/min (ref >=60)
GFR, EST NON AFRICAN AMERICAN: 73 mL/min (ref >=60)
Glucose, Bld: 89 mg/dL (ref 65–99)
POTASSIUM: 4.2 mmol/L (ref 3.5–5.3)
SODIUM: 140 mmol/L (ref 135–146)
Total Bilirubin: 0.5 mg/dL (ref 0.2–1.2)
Total Protein: 7 g/dL (ref 6.1–8.1)

## 2015-09-06 LAB — TSH: TSH: 1.573 u[IU]/mL (ref 0.350–4.500)

## 2015-09-06 LAB — CBC
HCT: 44.5 % (ref 39.0–52.0)
HEMOGLOBIN: 15.2 g/dL (ref 13.0–17.0)
MCH: 29.2 pg (ref 26.0–34.0)
MCHC: 34.2 g/dL (ref 30.0–36.0)
MCV: 85.4 fL (ref 78.0–100.0)
MPV: 10.7 fL (ref 8.6–12.4)
Platelets: 251 10*3/uL (ref 150–400)
RBC: 5.21 MIL/uL (ref 4.22–5.81)
RDW: 14.7 % (ref 11.5–15.5)
WBC: 8.1 10*3/uL (ref 4.0–10.5)

## 2015-09-06 LAB — LIPID PANEL
CHOL/HDL RATIO: 3.9 ratio (ref <=5.0)
CHOLESTEROL: 153 mg/dL (ref 125–200)
HDL: 39 mg/dL — AB (ref >=40)
LDL Cholesterol: 87 mg/dL (ref <130)
TRIGLYCERIDES: 136 mg/dL (ref <150)
VLDL: 27 mg/dL (ref <30)

## 2015-09-06 LAB — HEPATITIS C ANTIBODY: HCV Ab: NEGATIVE

## 2015-09-06 LAB — HEMOGLOBIN A1C
Hgb A1c MFr Bld: 5.5 % (ref <5.7)
Mean Plasma Glucose: 111 mg/dL (ref <117)

## 2015-09-12 NOTE — Progress Notes (Signed)
  Medical screening examination/treatment/procedure(s) were performed by non-physician practitioner and as supervising physician I was immediately available for consultation/collaboration.     

## 2015-09-12 NOTE — Addendum Note (Signed)
Addended by: Roselee Culver on: 09/12/2015 10:06 AM   Modules accepted: Miquel Dunn

## 2015-09-24 IMAGING — CR DG ABDOMEN ACUTE W/ 1V CHEST
3 series · 3 of 3 positions shown · non-contrast
Comparison: 02/11/2013

CLINICAL DATA: Abdominal pain, prior appendectomy

EXAM:
ACUTE ABDOMEN SERIES (ABDOMEN 2 VIEW & CHEST 1 VIEW)

[PA]
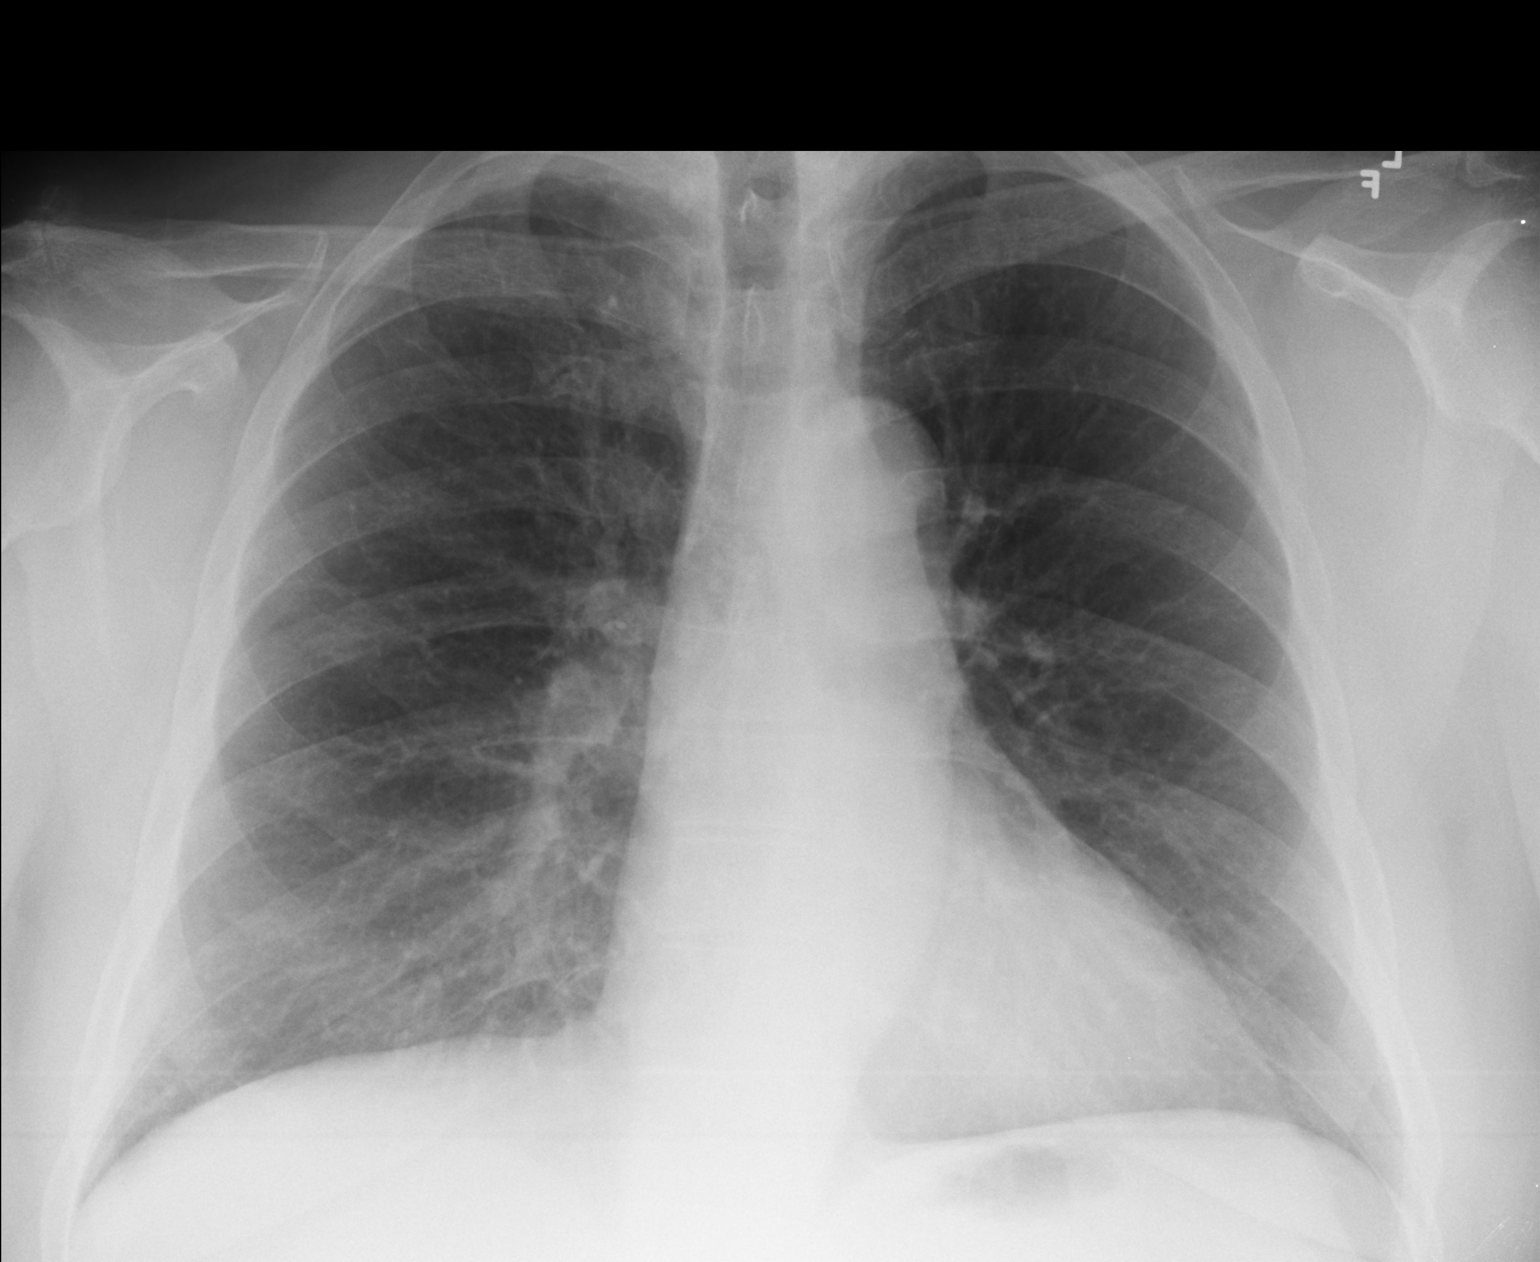

[AP (1 of 2)]
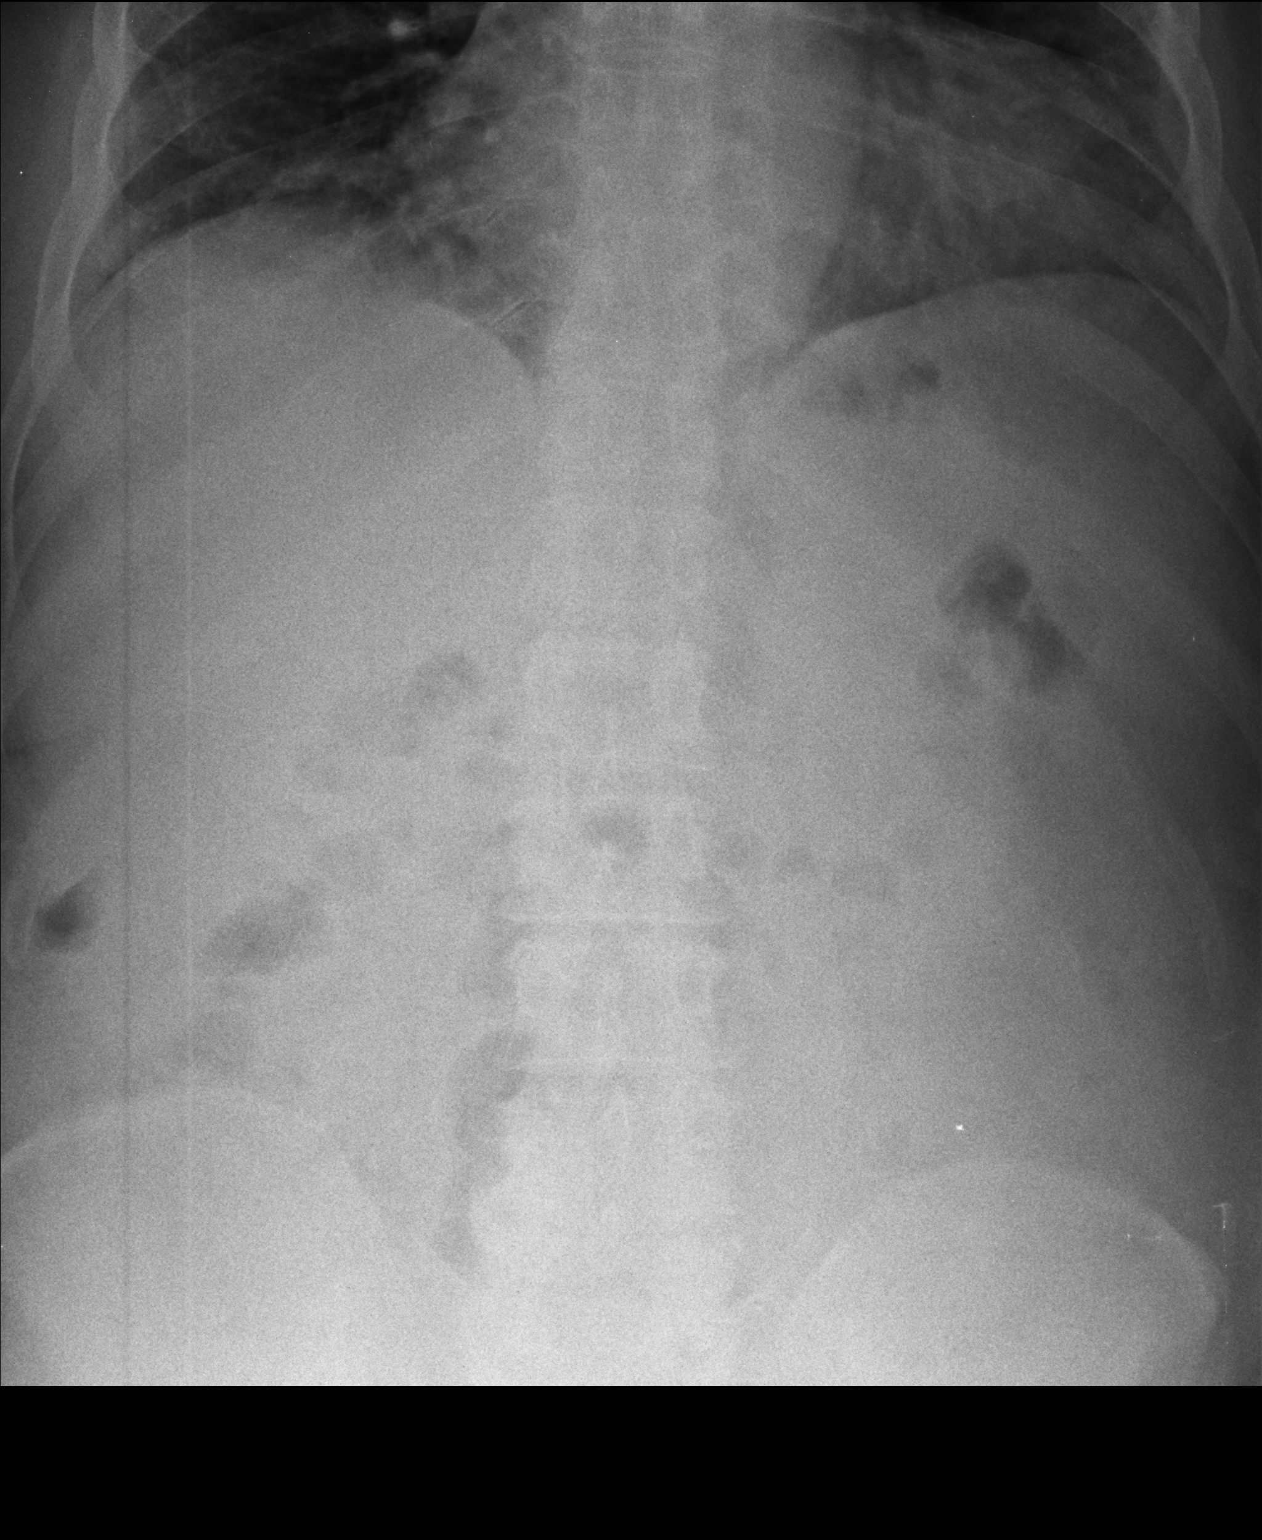

[AP (2 of 2)]
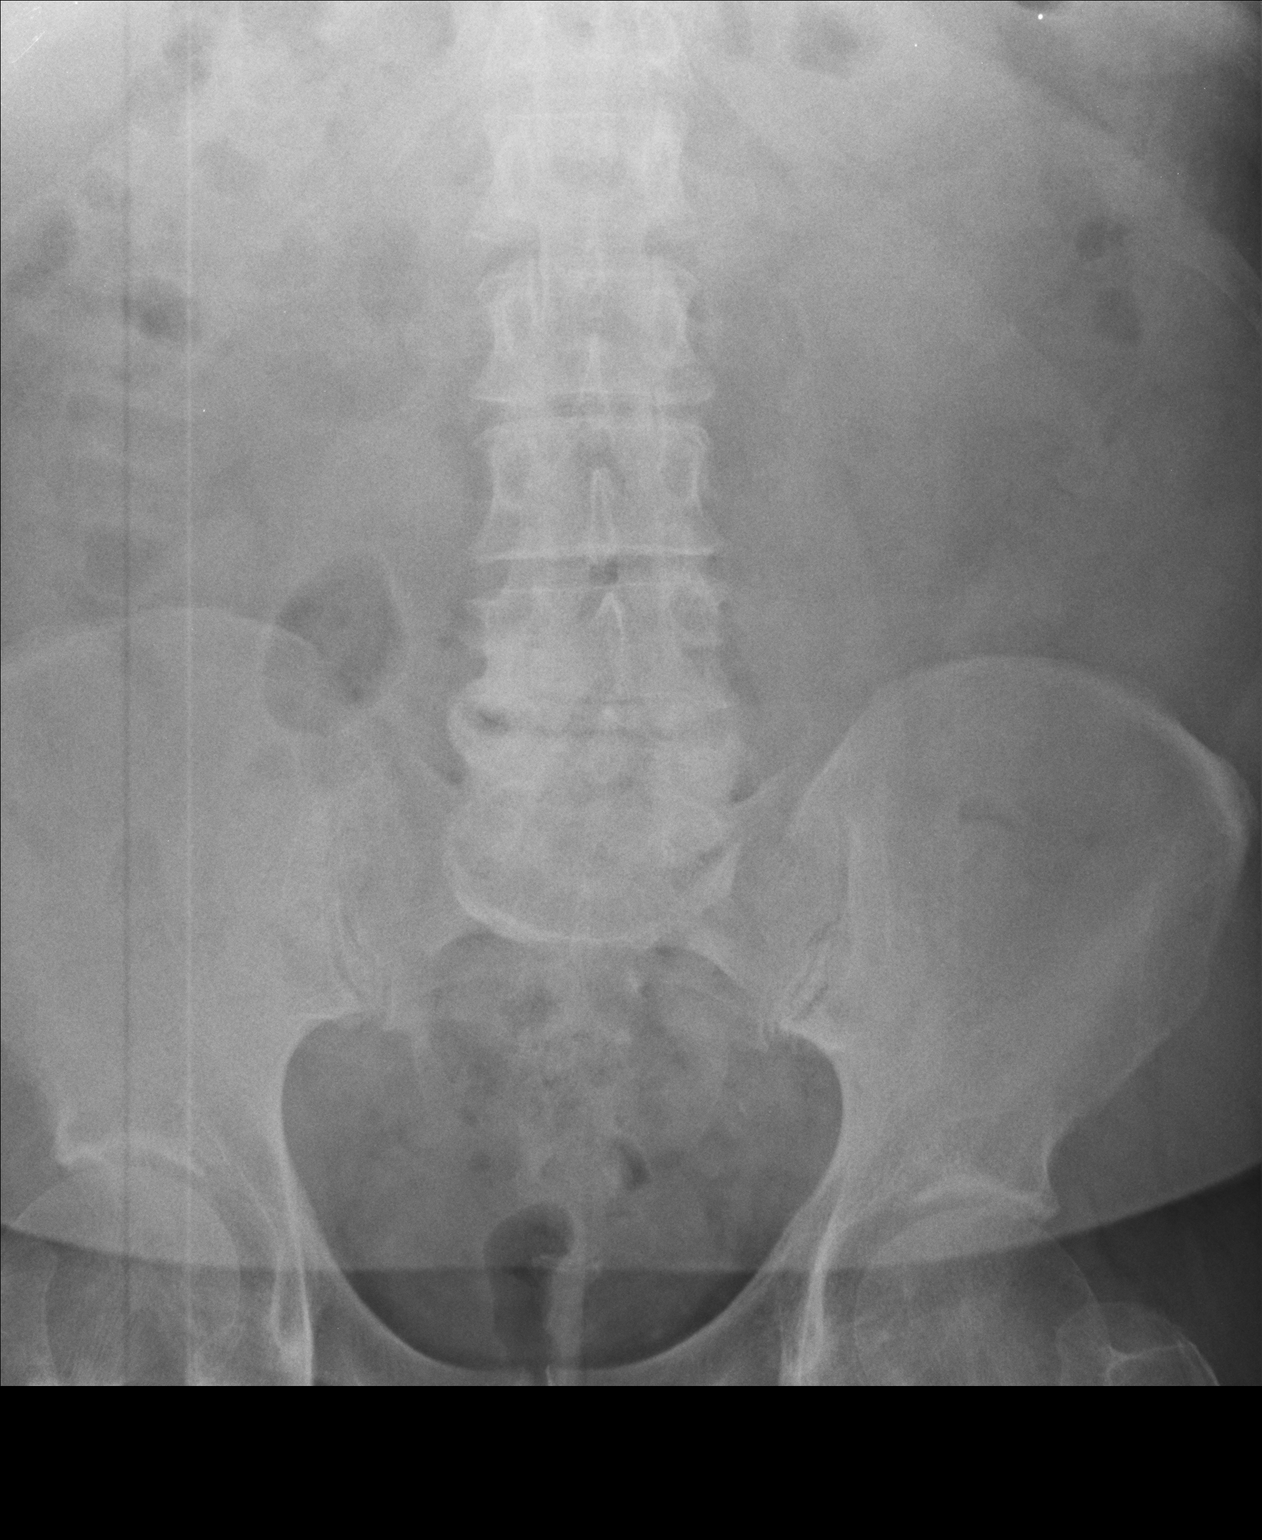

[3 of 3 positions shown; findings below may reference images not displayed]

FINDINGS: Lungs are clear.  No pleural effusion or pneumothorax.

The heart is normal in size.

Nonobstructive bowel gas pattern.

No evidence of free air under the diaphragm on the upright view.
IMPRESSION: No evidence of acute cardiopulmonary disease.

No evidence of small bowel obstruction or free air.

## 2015-10-21 ENCOUNTER — Other Ambulatory Visit: Payer: Self-pay | Admitting: Family Medicine

## 2015-10-21 ENCOUNTER — Encounter: Payer: Self-pay | Admitting: Family Medicine

## 2015-10-27 ENCOUNTER — Other Ambulatory Visit: Payer: Self-pay | Admitting: Family Medicine

## 2015-10-29 NOTE — Telephone Encounter (Signed)
Patient just had a physical, are we refilling flomax? How many?

## 2015-10-30 ENCOUNTER — Other Ambulatory Visit: Payer: Self-pay | Admitting: Family Medicine

## 2015-10-30 ENCOUNTER — Encounter: Payer: Self-pay | Admitting: Family Medicine

## 2015-10-31 ENCOUNTER — Other Ambulatory Visit: Payer: Self-pay

## 2015-10-31 MED ORDER — TAMSULOSIN HCL 0.4 MG PO CAPS: ORAL_CAPSULE | ORAL | Status: DC

## 2016-01-08 ENCOUNTER — Encounter: Payer: Self-pay | Admitting: Internal Medicine

## 2016-01-08 ENCOUNTER — Other Ambulatory Visit: Payer: Self-pay | Admitting: Internal Medicine

## 2016-01-15 ENCOUNTER — Encounter: Payer: Self-pay | Admitting: Internal Medicine

## 2016-04-02 ENCOUNTER — Encounter: Payer: Self-pay | Admitting: Internal Medicine

## 2016-04-04 ENCOUNTER — Other Ambulatory Visit: Payer: Self-pay | Admitting: Physician Assistant

## 2016-04-10 ENCOUNTER — Other Ambulatory Visit: Payer: Self-pay | Admitting: Physician Assistant

## 2016-04-22 ENCOUNTER — Other Ambulatory Visit: Payer: Self-pay | Admitting: Family Medicine

## 2016-04-30 ENCOUNTER — Other Ambulatory Visit: Payer: Self-pay | Admitting: Physician Assistant

## 2016-05-22 ENCOUNTER — Ambulatory Visit (AMBULATORY_SURGERY_CENTER): Payer: Self-pay | Admitting: *Deleted

## 2016-05-22 ENCOUNTER — Encounter: Payer: Self-pay | Admitting: Internal Medicine

## 2016-05-22 VITALS — Ht 71.0 in | Wt 273.8 lb

## 2016-05-22 DIAGNOSIS — K2270 Barrett's esophagus without dysplasia: Secondary | ICD-10-CM

## 2016-05-22 NOTE — Progress Notes (Signed)
No egg or soy allergy known to patient  No issues with past sedation with any surgeries  or procedures, no intubation problems  No diet pills per patient No home 02 use per patient  No blood thinners per patient  Pt denies issues with constipation  No A fib or A flutter  emmi video to e mail    

## 2016-06-03 ENCOUNTER — Encounter: Payer: Self-pay | Admitting: Internal Medicine

## 2016-06-03 ENCOUNTER — Ambulatory Visit (AMBULATORY_SURGERY_CENTER): Payer: BC Managed Care – PPO | Admitting: Internal Medicine

## 2016-06-03 VITALS — BP 120/76 | HR 54 | Temp 97.5°F | Resp 18 | Ht 71.0 in | Wt 278.0 lb

## 2016-06-03 DIAGNOSIS — K2270 Barrett's esophagus without dysplasia: Secondary | ICD-10-CM | POA: Diagnosis not present

## 2016-06-03 MED ORDER — LANSOPRAZOLE 30 MG PO CPDR: DELAYED_RELEASE_CAPSULE | ORAL | 11 refills | Status: DC

## 2016-06-03 MED ORDER — SODIUM CHLORIDE 0.9 % IV SOLN: 500.0000 mL | INTRAVENOUS | Status: DC

## 2016-06-03 NOTE — Patient Instructions (Signed)
YOU HAD AN ENDOSCOPIC PROCEDURE TODAY AT [facility]:   Refer to the procedure report that was given to you for any specific questions about what was found during the examination.  If the procedure report does not answer your questions, please call your gastroenterologist to clarify.  If you requested that your care partner not be given the details of your procedure findings, then the procedure report has been included in a sealed envelope for you to review at your convenience later.  YOU SHOULD EXPECT: Some feelings of bloating in the abdomen. Passage of more gas than usual.  Walking can help get rid of the air that was put into your GI tract during the procedure and reduce the bloating.  Please Note:  You might notice some irritation and congestion in your nose or some drainage.  This is from the oxygen used during your procedure.  There is no need for concern and it should clear up in a day or so.  SYMPTOMS TO REPORT IMMEDIATELY:  Following upper endoscopy (EGD)  Vomiting of blood or coffee ground material  New chest pain or pain under the shoulder blades  Painful or persistently difficult swallowing  New shortness of breath  Fever of 100F or higher  Black, tarry-looking stools  For urgent or emergent issues, a gastroenterologist can be reached at any hour by calling [phone].   DIET:  We do recommend a small meal at first, but then you may proceed to your regular diet.  Drink plenty of fluids but you should avoid alcoholic beverages for 24 hours.  ACTIVITY:  You should plan to take it easy for the rest of today and you should NOT DRIVE or use heavy machinery until tomorrow (because of the sedation medicines used during the test).    FOLLOW UP: Our staff will call the number listed on your records the next business day following your procedure to check on you and address any questions or concerns that you may have regarding the information given to you following  your procedure. If we do not reach you, we will leave a message.  However, if you are feeling well and you are not experiencing any problems, there is no need to return our call.  We will assume that you have returned to your regular daily activities without incident.  If any biopsies were taken you will be contacted by phone or by letter within the next 1-3 weeks.  Please call us at [phone] if you have not heard about the biopsies in 3 weeks.    SIGNATURES/CONFIDENTIALITY: You and/or your care partner have signed paperwork which will be entered into your electronic medical record.  These signatures attest to the fact that that the information above on your After Visit Summary has been reviewed and is understood.  Full responsibility of the confidentiality of this discharge information lies with you and/or your care-partner.  Await pathology  Next upper endoscopy to be determined by pathology results  Continue normal medications

## 2016-06-03 NOTE — Op Note (Signed)
[place] Patient Name: Gregg Smith Procedure Date: 06/03/2016 8:04 AM MRN: [id number] Endoscopist: Docia Chuck. Henrene Pastor , MD Age: 70 Referring MD:  Date of Birth: 07-24-1946 Gender: Male Account #: [id number] Procedure:                Upper GI endoscopy, with biopsy Indications:              Follow-up of Barrett's esophagus. The patient had                            screening EGD for chronic GERD May 2016. No                            Barrett's by endoscopic definition. However, nodule                            at the gastroesophageal junction was biopsied and                            said to have "Barrett's". Patient has been on PPI                            and asymptomatic. Now for follow-up reassess Medicines:                Monitored Anesthesia Care Procedure:                Pre-Anesthesia Assessment:                           - Prior to the procedure, a History and Physical                            was performed, and patient medications and                            allergies were reviewed. The patient's tolerance of                            previous anesthesia was also reviewed. The risks                            and benefits of the procedure and the sedation                            options and risks were discussed with the patient.                            All questions were answered, and informed consent                            was obtained. Prior Anticoagulants: The patient has                            taken no previous anticoagulant or antiplatelet  agents. ASA Grade Assessment: II - A patient with                            mild systemic disease. After reviewing the risks                            and benefits, the patient was deemed in                            satisfactory condition to undergo the procedure.                           After obtaining informed consent, the endoscope was     passed under direct vision. Throughout the                            procedure, the patient's blood pressure, pulse, and                            oxygen saturations were monitored continuously. The                            Model GIF-[id number]) scope was introduced                            through the mouth, and advanced to the second part                            of duodenum. The upper GI endoscopy was                            accomplished without difficulty. The patient                            tolerated the procedure well. Scope In: Scope Out: Findings:                 The esophagus was normal. The Z line was in the                            normal position. No Barrett's by endoscopic                            criteria. Previous nodule is absent. Biopsies of                            one area of columnar mucosa at a proximal gastric                            fold was taken with a cold forceps for histology.                           The stomach was normal.  The examined duodenum was normal.                           The cardia and gastric fundus were normal on                            retroflexion. Complications:            No immediate complications. Estimated Blood Loss:     Estimated blood loss: none. Impression:               - Normal esophagus. No obvious Barrett's. Previous                            nodule resolved. Biopsied, as described.                           - Normal stomach.                           - Normal examined duodenum. Recommendation:           - Patient has a contact number available for                            emergencies. The signs and symptoms of potential                            delayed complications were discussed with the                            patient. Return to normal activities tomorrow.                            Written discharge instructions were provided to the                             patient.                           - Resume previous diet.                           - Continue present medications.                           - The need for repeat EGD to be determined based on                            pathology. Unlikely need repeat EGD Docia Chuck. Henrene Pastor, MD 06/03/2016 8:32:17 AM This report has been signed electronically.

## 2016-06-03 NOTE — Progress Notes (Signed)
To recovery, report to [place], RN, VSS 

## 2016-06-04 ENCOUNTER — Telehealth: Payer: Self-pay

## 2016-06-04 NOTE — Telephone Encounter (Signed)
  Follow up Call-  Call back number 06/03/2016 01/02/2015  Post procedure Call Back phone  # [phone]  Permission to leave phone message Yes Yes  Some recent data might be hidden     Patient questions:  Do you have a fever, pain , or abdominal swelling? No. Pain Score  0 *  Have you tolerated food without any problems? Yes.    Have you been able to return to your normal activities? Yes.    Do you have any questions about your discharge instructions: Diet   No. Medications  No. Follow up visit  No.  Do you have questions or concerns about your Care? No.  Actions: * If pain score is 4 or above: No action needed, pain <4.

## 2016-06-11 ENCOUNTER — Encounter: Payer: Self-pay | Admitting: Internal Medicine

## 2016-07-08 ENCOUNTER — Other Ambulatory Visit: Payer: Self-pay | Admitting: Physician Assistant

## 2016-07-10 ENCOUNTER — Other Ambulatory Visit: Payer: Self-pay | Admitting: Physician Assistant

## 2016-07-10 NOTE — Telephone Encounter (Signed)
2 months on all request and I need to see him back in January for repeat physical and medication refills. Philis Fendt, MS, PA-C 1:33 PM, 07/10/2016

## 2016-07-10 NOTE — Telephone Encounter (Signed)
Fax request [facility] for Sucaralfate 1gm #360, 1 befoere each meal and hs.  Sent to Philis Fendt

## 2016-07-11 ENCOUNTER — Telehealth: Payer: Self-pay

## 2016-07-11 MED ORDER — LISINOPRIL-HYDROCHLOROTHIAZIDE 10-12.5 MG PO TABS: 1.0000 | ORAL_TABLET | Freq: Every day | ORAL | 1 refills | Status: DC

## 2016-07-11 MED ORDER — ROSUVASTATIN CALCIUM 10 MG PO TABS: 10.0000 mg | ORAL_TABLET | Freq: Every day | ORAL | 1 refills | Status: DC

## 2016-07-11 MED ORDER — SUCRALFATE 1 G PO TABS: ORAL_TABLET | ORAL | 1 refills | Status: DC

## 2016-07-11 NOTE — Telephone Encounter (Signed)
Called wife back and advised that the two mos of RFs were sent but then pt needs to RTC for check up before Jan 10th. Wife said that pt is really busy with school and knee surgery until Jan but will try to get in then.

## 2016-07-11 NOTE — Telephone Encounter (Addendum)
PATIENT'S WIFE (MAUREEN) WOULD LIKE MICHAEL CLARK TO REFILL HER HUSBAND'S LISINOPRIL FOR LONGER THAN January 2018. SHE SAID HE IS TO HAVE KNEE REPLACEMENT SURGERY AT THE END OF December AND SHE WILL NOT BE ABLE TO GET HIM INTO THE CAR TO RETURN TO [facility]. BEST PHONE [phone] ([place] NAME IS MAUREEN) [place]

## 2016-07-14 NOTE — Telephone Encounter (Signed)
Okay to refill? 

## 2016-07-15 NOTE — Telephone Encounter (Signed)
Called pharm and put one more RF on both meds per Michael's OK.

## 2016-07-15 NOTE — Telephone Encounter (Signed)
done

## 2016-07-25 ENCOUNTER — Ambulatory Visit (INDEPENDENT_AMBULATORY_CARE_PROVIDER_SITE_OTHER): Payer: BC Managed Care – PPO | Admitting: Physician Assistant

## 2016-07-25 VITALS — BP 124/82 | HR 79 | Temp 98.1°F | Resp 18 | Ht 71.0 in | Wt 277.0 lb

## 2016-07-25 DIAGNOSIS — Z01818 Encounter for other preprocedural examination: Secondary | ICD-10-CM | POA: Diagnosis not present

## 2016-07-25 NOTE — Progress Notes (Signed)
07/25/2016 5:30 PM   DOB: 03/17/1946 / MRN: [id number]  SUBJECTIVE:  Gregg Smith is a 70 y.o. male presenting for surgical clearance.  He denies a history of CAD.  He has a history of HTN and dyslipidemia both controlled with medication.  He does not take ASA or other blood thinner.   He is allergic to penicillins.   He  has a past medical history of Allergy; Arthritis; Barrett's esophagus; Chicken pox; Colon polyps; Diverticulosis; GERD (gastroesophageal reflux disease); Hemorrhoids; Hyperlipidemia; Hypertension; Leg cramps; Measles; Mumps; Pneumonia; Rhinitis; Sleep apnea; and Snoring disorder (05/09/2013).    He  reports that he has quit smoking. He has never used smokeless tobacco. He reports that he drinks about 1.8 oz of alcohol per week . He reports that he does not use drugs. He  reports that he currently engages in sexual activity. He reports using the following method of birth control/protection: None. The patient  has a past surgical history that includes Appendectomy [id number]); Tonsillectomy and adenoidectomy (1954); Colonoscopy; and Upper gastrointestinal endoscopy.  His family history includes Alzheimer's disease in his mother; Aortic aneurysm in his son; Cancer in his father and maternal grandmother; Heart disease in his maternal grandfather and son; Stomach cancer in his paternal grandfather.  Review of Systems  Constitutional: Negative for chills and fever.  HENT: Negative for hearing loss.   Respiratory: Negative for cough and wheezing.   Cardiovascular: Negative for chest pain, leg swelling and PND.  Skin: Negative for itching and rash.  Neurological: Negative for dizziness.    The problem list and medications were reviewed and updated by myself where necessary and exist elsewhere in the encounter.   OBJECTIVE:  BP 124/82 (BP Location: Right Arm, Patient Position: Sitting, Cuff Size: Large)   Pulse 79   Temp 98.1 F (36.7 C) (Oral)   Resp 18   Ht 5\' 11"  (1.803  m)   Wt 277 lb (125.6 kg)   SpO2 95%   BMI 38.63 kg/m   Physical Exam  Constitutional: He is oriented to person, place, and time. He appears well-developed and well-nourished. No distress.  HENT:  Head: Normocephalic and atraumatic.  Right Ear: External ear normal.  Left Ear: External ear normal.  Cardiovascular: Normal rate, regular rhythm, S1 normal, S2 normal, normal heart sounds and intact distal pulses.   No extrasystoles are present. Exam reveals no gallop, no S3, no S4, no distant heart sounds and no friction rub.   No murmur heard. Pulses:      Carotid pulses are 2+ on the right side, and 2+ on the left side.      Radial pulses are 2+ on the right side, and 2+ on the left side.       Posterior tibial pulses are 2+ on the right side, and 2+ on the left side.  Negative for carotid bruit bilaterally.   Pulmonary/Chest: Effort normal and breath sounds normal.  Abdominal: Soft. Bowel sounds are normal.  Musculoskeletal: Normal range of motion. He exhibits no edema.  Neurological: He is alert and oriented to person, place, and time.  Skin: Skin is warm and dry. He is not diaphoretic.  Psychiatric: He has a normal mood and affect. His behavior is normal. Judgment and thought content normal.   EKG: NSR without evidence of hypertrophy, ischemia, infarction. Normal axis.   No results found for this or any previous visit (from the past 72 hour(s)).  No results found.  ASSESSMENT AND PLAN  Gregg Smith was seen today  for medical clearance.  Diagnoses and all orders for this visit:  Preoperative clearance: Per Nyoka Cowden criteria he is low risk.  I have composed a letter advising he has been screened and should be allowed to pursue surgery.  -     EKG 12-Lead    The patient is advised to call or return to clinic if he does not see an improvement in symptoms, or to seek the care of the closest emergency department if he worsens with the above plan.   Philis Fendt, MHS, PA-C [facility] 07/25/2016 5:30 PM

## 2016-07-25 NOTE — Patient Instructions (Signed)
     IF you received an x-ray today, you will receive an invoice from [facility]. Please contact [facility] at [phone] with questions or concerns regarding your invoice.   IF you received labwork today, you will receive an invoice from [facility]. Please contact [facility] at [phone] with questions or concerns regarding your invoice.   Our billing staff will not be able to assist you with questions regarding bills from these companies.  You will be contacted with the lab results as soon as they are available. The fastest way to get your results is to activate your My Chart account. Instructions are located on the last page of this paperwork. If you have not heard from us regarding the results in 2 weeks, please contact this office.      

## 2016-08-02 NOTE — H&P (Signed)
TOTAL KNEE ADMISSION H&P  Patient is being admitted for left total knee arthroplasty.  Subjective:  Chief Complaint:  Left knee primary OA / pain  HPI: Gregg Smith, 70 y.o. male, has a history of pain and functional disability in the left knee due to arthritis and has failed non-surgical conservative treatments for greater than 12 weeks to includeNSAID's and/or analgesics, corticosteriod injections, use of assistive devices and activity modification.  Onset of symptoms was gradual, starting >10 years ago with gradually worsening course since that time. The patient noted no past surgery on the left knee(s).  Patient currently rates pain in the left knee(s) at 7 out of 10 with activity. Patient has night pain, worsening of pain with activity and weight bearing, pain that interferes with activities of daily living, pain with passive range of motion, crepitus and joint swelling.  Patient has evidence of periarticular osteophytes and joint space narrowing by imaging studies. There is no active infection.   Risks, benefits and expectations were discussed with the patient.  Risks including but not limited to the risk of anesthesia, blood clots, nerve damage, blood vessel damage, failure of the prosthesis, infection and up to and including death.  Patient understand the risks, benefits and expectations and wishes to proceed with surgery.   PCP: No primary care provider on file.  D/C Plans:      Home  Post-op Meds:       No Rx given  Tranexamic Acid:      To be given - IV  Decadron:      Is to be given  FYI:     ASA  Norco    Patient Active Problem List   Diagnosis Date Noted  . OSA (obstructive sleep apnea) 03/22/2014  . UARS (upper airway resistance syndrome) 03/22/2014  . Snoring disorder 05/09/2013  . Unspecified essential hypertension 02/11/2013  . Other and unspecified hyperlipidemia 02/11/2013  . Arthritis 02/11/2013   Past Medical History:  Diagnosis Date  . Allergy     seasonal   . Arthritis   . Barrett's esophagus   . Chicken pox   . Colon polyps    hyperplastic  . Diverticulosis   . GERD (gastroesophageal reflux disease)   . Hemorrhoids   . Hyperlipidemia   . Hypertension   . Leg cramps   . Measles   . Mumps   . Pneumonia   . Rhinitis   . Sleep apnea    dental device  . Snoring disorder 05/09/2013    Past Surgical History:  Procedure Laterality Date  . APPENDECTOMY  1957  . COLONOSCOPY    . [place]  . UPPER GASTROINTESTINAL ENDOSCOPY      No prescriptions prior to admission.   Allergies  Allergen Reactions  . Penicillins Hives and Rash    Has patient had a PCN reaction causing immediate rash, facial/tongue/throat swelling, SOB or lightheadedness with hypotension:unsure Has patient had a PCN reaction causing severe rash involving mucus membranes or skin necrosis:unsure Has patient had a PCN reaction that required hospitalization:No Has patient had a PCN reaction occurring within the last 10 years:No If all of the above answers are "NO", then may proceed with Cephalosporin use. Childhood reaction     Social History  Substance Use Topics  . Smoking status: Former Research scientist (life sciences)  . Smokeless tobacco: Never Used  . Alcohol use 1.8 oz/week    3 Cans of beer per week     Comment: 2 beers weekly  Family History  Problem Relation Age of Onset  . Alzheimer's disease Mother   . Cancer Father   . Aortic aneurysm Son   . Heart disease Son   . Cancer Maternal Grandmother   . Heart disease Maternal Grandfather   . Stomach cancer Paternal Grandfather   . Colon cancer Neg Hx   . Colon polyps Neg Hx   . Esophageal cancer Neg Hx   . Rectal cancer Neg Hx      Review of Systems  Constitutional: Negative.   HENT: Negative.   Eyes: Negative.   Respiratory: Negative.   Cardiovascular: Negative.   Gastrointestinal: Positive for heartburn.  Genitourinary: Negative.   Musculoskeletal: Positive for joint pain.   Skin: Negative.   Neurological: Negative.   Endo/Heme/Allergies: Positive for environmental allergies.  Psychiatric/Behavioral: Negative.     Objective:  Physical Exam  Constitutional: He is oriented to person, place, and time. He appears well-developed.  HENT:  Head: Normocephalic.  Eyes: Pupils are equal, round, and reactive to light.  Neck: Neck supple. No JVD present. No tracheal deviation present. No thyromegaly present.  Cardiovascular: Normal rate, regular rhythm, normal heart sounds and intact distal pulses.   Respiratory: Effort normal and breath sounds normal. No respiratory distress. He has no wheezes.  GI: Soft. There is no tenderness. There is no guarding.  Musculoskeletal:       Left knee: He exhibits decreased range of motion, swelling and bony tenderness. He exhibits no ecchymosis, no deformity, no laceration and no erythema. Tenderness found.  Lymphadenopathy:    He has no cervical adenopathy.  Neurological: He is alert and oriented to person, place, and time.  Skin: Skin is warm and dry.  Psychiatric: He has a normal mood and affect.     Labs:  Estimated body mass index is 38.63 kg/m as calculated from the following:   Height as of 07/25/16: 5\' 11"  (1.803 m).   Weight as of 07/25/16: 125.6 kg (277 lb).   Imaging Review Plain radiographs demonstrate severe degenerative joint disease of the left knee(s). The overall alignment is significant valgus. The bone quality appears to be good for age and reported activity level.  Assessment/Plan:  End stage arthritis, left knee   The patient history, physical examination, clinical judgment of the provider and imaging studies are consistent with end stage degenerative joint disease of the left knee(s) and total knee arthroplasty is deemed medically necessary. The treatment options including medical management, injection therapy arthroscopy and arthroplasty were discussed at length. The risks and benefits of total  knee arthroplasty were presented and reviewed. The risks due to aseptic loosening, infection, stiffness, patella tracking problems, thromboembolic complications and other imponderables were discussed. The patient acknowledged the explanation, agreed to proceed with the plan and consent was signed. Patient is being admitted for inpatient treatment for surgery, pain control, PT, OT, prophylactic antibiotics, VTE prophylaxis, progressive ambulation and ADL's and discharge planning. The patient is planning to be discharged home.      Gregg Pugh Arren Laminack   PA-C  08/02/2016, 8:52 PM

## 2016-08-04 NOTE — Patient Instructions (Addendum)
Gregg Smith  08/04/2016   Your procedure is scheduled on: Monday 08/11/2016  Report to [facility] Main  Entrance take [place]  elevators to 3rd floor to  [place] at  [place]  AM.  Call this number if you have problems the morning of surgery [phone]   Remember: ONLY 1 PERSON MAY GO WITH YOU TO SHORT STAY TO GET  READY MORNING OF [place].   Do not eat food or drink liquids :After Midnight.     Take these medicines the morning of surgery with A SIP OF WATER: none                                You may not have any metal on your body including hair pins and              piercings  Do not wear jewelry, make-up, lotions, powders or perfumes, deodorant             Do not wear nail polish.  Do not shave  48 hours prior to surgery.              Men may shave face and neck.   Do not bring valuables to the hospital. [place].  Contacts, dentures or bridgework may not be worn into surgery.  Leave suitcase in the car. After surgery it may be brought to your room.                  Please read over the following fact sheets you were given: _____________________________________________________________________             [facility] - Preparing for Surgery Before surgery, you can play an important role.  Because skin is not sterile, your skin needs to be as free of germs as possible.  You can reduce the number of germs on your skin by washing with CHG (chlorahexidine gluconate) soap before surgery.  CHG is an antiseptic cleaner which kills germs and bonds with the skin to continue killing germs even after washing. Please DO NOT use if you have an allergy to CHG or antibacterial soaps.  If your skin becomes reddened/irritated stop using the CHG and inform your nurse when you arrive at Short Stay. Do not shave (including legs and underarms) for at least 48 hours prior to the first CHG shower.  You may  shave your face/neck. Please follow these instructions carefully:  1.  Shower with CHG Soap the night before surgery and the  morning of Surgery.  2.  If you choose to wash your hair, wash your hair first as usual with your  normal  shampoo.  3.  After you shampoo, rinse your hair and body thoroughly to remove the  shampoo.                           4.  Use CHG as you would any other liquid soap.  You can apply chg directly  to the skin and wash                       Gently with a scrungie or clean washcloth.  5.  Apply the CHG Soap to your body ONLY FROM THE NECK DOWN.   Do not use on face/ open                           Wound or open sores. Avoid contact with eyes, ears mouth and genitals (private parts).                       Wash face,  Genitals (private parts) with your normal soap.             6.  Wash thoroughly, paying special attention to the area where your surgery  will be performed.  7.  Thoroughly rinse your body with warm water from the neck down.  8.  DO NOT shower/wash with your normal soap after using and rinsing off  the CHG Soap.                9.  Pat yourself dry with a clean towel.            10.  Wear clean pajamas.            11.  Place clean sheets on your bed the night of your first shower and do not  sleep with pets. Day of Surgery : Do not apply any lotions/deodorants the morning of surgery.  Please wear clean clothes to the hospital/surgery center.  FAILURE TO FOLLOW THESE INSTRUCTIONS MAY RESULT IN THE CANCELLATION OF YOUR SURGERY PATIENT SIGNATURE_________________________________  NURSE SIGNATURE__________________________________  ________________________________________________________________________   Gregg Smith  An incentive spirometer is a tool that can help keep your lungs clear and active. This tool measures how well you are filling your lungs with each breath. Taking long deep breaths may help reverse or decrease the chance of developing  breathing (pulmonary) problems (especially infection) following:  A long period of time when you are unable to move or be active. BEFORE THE PROCEDURE   If the spirometer includes an indicator to show your best effort, your nurse or respiratory therapist will set it to a desired goal.  If possible, sit up straight or lean slightly forward. Try not to slouch.  Hold the incentive spirometer in an upright position. INSTRUCTIONS FOR USE  1. Sit on the edge of your bed if possible, or sit up as far as you can in bed or on a chair. 2. Hold the incentive spirometer in an upright position. 3. Breathe out normally. 4. Place the mouthpiece in your mouth and seal your lips tightly around it. 5. Breathe in slowly and as deeply as possible, raising the piston or the ball toward the top of the column. 6. Hold your breath for 3-5 seconds or for as long as possible. Allow the piston or ball to fall to the bottom of the column. 7. Remove the mouthpiece from your mouth and breathe out normally. 8. Rest for a few seconds and repeat Steps 1 through 7 at least 10 times every 1-2 hours when you are awake. Take your time and take a few normal breaths between deep breaths. 9. The spirometer may include an indicator to show your best effort. Use the indicator as a goal to work toward during each repetition. 10. After each set of 10 deep breaths, practice coughing to be sure your lungs are clear. If you have an incision (the cut made at the time of surgery), support your incision when coughing by placing a pillow or  rolled up towels firmly against it. Once you are able to get out of bed, walk around indoors and cough well. You may stop using the incentive spirometer when instructed by your caregiver.  RISKS AND COMPLICATIONS  Take your time so you do not get dizzy or light-headed.  If you are in pain, you may need to take or ask for pain medication before doing incentive spirometry. It is harder to take a deep  breath if you are having pain. AFTER USE  Rest and breathe slowly and easily.  It can be helpful to keep track of a log of your progress. Your caregiver can provide you with a simple table to help with this. If you are using the spirometer at home, follow these instructions: [place] IF:   You are having difficultly using the spirometer.  You have trouble using the spirometer as often as instructed.  Your pain medication is not giving enough relief while using the spirometer.  You develop fever of 100.5 F (38.1 C) or higher. SEEK IMMEDIATE MEDICAL CARE IF:   You cough up bloody sputum that had not been present before.  You develop fever of 102 F (38.9 C) or greater.  You develop worsening pain at or near the incision site. MAKE SURE YOU:   Understand these instructions.  Will watch your condition.  Will get help right away if you are not doing well or get worse. Document Released: 12/29/2006 Document Revised: 11/10/2011 Document Reviewed: 03/01/2007 ExitCare Patient Information 2014 ExitCare, [place].   ________________________________________________________________________  WHAT IS A BLOOD TRANSFUSION? Blood Transfusion Information  A transfusion is the replacement of blood or some of its parts. Blood is made up of multiple cells which provide different functions.  Red blood cells carry oxygen and are used for blood loss replacement.  White blood cells fight against infection.  Platelets control bleeding.  Plasma helps clot blood.  Other blood products are available for specialized needs, such as hemophilia or other clotting disorders. BEFORE THE TRANSFUSION  Who gives blood for transfusions?   Healthy volunteers who are fully evaluated to make sure their blood is safe. This is blood bank blood. Transfusion therapy is the safest it has ever been in the practice of medicine. Before blood is taken from a donor, a complete history is taken to make sure  that person has no history of diseases nor engages in risky social behavior (examples are intravenous drug use or sexual activity with multiple partners). The donor's travel history is screened to minimize risk of transmitting infections, such as malaria. The donated blood is tested for signs of infectious diseases, such as HIV and hepatitis. The blood is then tested to be sure it is compatible with you in order to minimize the chance of a transfusion reaction. If you or a relative donates blood, this is often done in anticipation of surgery and is not appropriate for emergency situations. It takes many days to process the donated blood. RISKS AND COMPLICATIONS Although transfusion therapy is very safe and saves many lives, the main dangers of transfusion include:   Getting an infectious disease.  Developing a transfusion reaction. This is an allergic reaction to something in the blood you were given. Every precaution is taken to prevent this. The decision to have a blood transfusion has been considered carefully by your caregiver before blood is given. Blood is not given unless the benefits outweigh the risks. AFTER THE TRANSFUSION  Right after receiving a blood transfusion, you will usually  feel much better and more energetic. This is especially true if your red blood cells have gotten low (anemic). The transfusion raises the level of the red blood cells which carry oxygen, and this usually causes an energy increase.  The nurse administering the transfusion will monitor you carefully for complications. HOME CARE INSTRUCTIONS  No special instructions are needed after a transfusion. You may find your energy is better. Speak with your caregiver about any limitations on activity for underlying diseases you may have. SEEK MEDICAL CARE IF:   Your condition is not improving after your transfusion.  You develop redness or irritation at the intravenous (IV) site. SEEK IMMEDIATE MEDICAL CARE IF:  Any of  the following symptoms occur over the next 12 hours:  Shaking chills.  You have a temperature by mouth above 102 F (38.9 C), not controlled by medicine.  Chest, back, or muscle pain.  People around you feel you are not acting correctly or are confused.  Shortness of breath or difficulty breathing.  Dizziness and fainting.  You get a rash or develop hives.  You have a decrease in urine output.  Your urine turns a dark color or changes to pink, red, or brown. Any of the following symptoms occur over the next 10 days:  You have a temperature by mouth above 102 F (38.9 C), not controlled by medicine.  Shortness of breath.  Weakness after normal activity.  The white part of the eye turns yellow (jaundice).  You have a decrease in the amount of urine or are urinating less often.  Your urine turns a dark color or changes to pink, red, or brown. Document Released: 08/15/2000 Document Revised: 11/10/2011 Document Reviewed: 04/03/2008 [facility] Patient Information 2014 [place], [place].  _______________________________________________________________________

## 2016-08-06 ENCOUNTER — Encounter (HOSPITAL_COMMUNITY)
Admission: RE | Admit: 2016-08-06 | Discharge: 2016-08-06 | Disposition: A | Discharge status: Home / Self Care | Payer: BC Managed Care – PPO | Source: Ambulatory Visit | Attending: Orthopedic Surgery | Admitting: Orthopedic Surgery

## 2016-08-06 ENCOUNTER — Encounter (HOSPITAL_COMMUNITY): Payer: Self-pay

## 2016-08-06 DIAGNOSIS — M1990 Unspecified osteoarthritis, unspecified site: Secondary | ICD-10-CM | POA: Diagnosis not present

## 2016-08-06 DIAGNOSIS — K635 Polyp of colon: Secondary | ICD-10-CM | POA: Diagnosis not present

## 2016-08-06 DIAGNOSIS — K2270 Barrett's esophagus without dysplasia: Secondary | ICD-10-CM | POA: Insufficient documentation

## 2016-08-06 DIAGNOSIS — Z01812 Encounter for preprocedural laboratory examination: Secondary | ICD-10-CM | POA: Diagnosis not present

## 2016-08-06 DIAGNOSIS — G478 Other sleep disorders: Secondary | ICD-10-CM | POA: Insufficient documentation

## 2016-08-06 DIAGNOSIS — K219 Gastro-esophageal reflux disease without esophagitis: Secondary | ICD-10-CM | POA: Diagnosis not present

## 2016-08-06 DIAGNOSIS — I10 Essential (primary) hypertension: Secondary | ICD-10-CM | POA: Diagnosis not present

## 2016-08-06 DIAGNOSIS — E785 Hyperlipidemia, unspecified: Secondary | ICD-10-CM | POA: Insufficient documentation

## 2016-08-06 DIAGNOSIS — G4733 Obstructive sleep apnea (adult) (pediatric): Secondary | ICD-10-CM | POA: Insufficient documentation

## 2016-08-06 DIAGNOSIS — R0683 Snoring: Secondary | ICD-10-CM | POA: Diagnosis not present

## 2016-08-06 LAB — BASIC METABOLIC PANEL
Anion gap: 5 (ref 5–15)
BUN: 20 mg/dL (ref 6–20)
CO2: 27 mmol/L (ref 22–32)
Calcium: 8.8 mg/dL — ABNORMAL LOW (ref 8.9–10.3)
Chloride: 105 mmol/L (ref 101–111)
Creatinine, Ser: 1.1 mg/dL (ref 0.61–1.24)
GFR calc Af Amer: >60 mL/min (ref >60)
GLUCOSE: 127 mg/dL — AB (ref 65–99)
POTASSIUM: 4.1 mmol/L (ref 3.5–5.1)
Sodium: 137 mmol/L (ref 135–145)

## 2016-08-06 LAB — CBC
HEMATOCRIT: 41.7 % (ref 39.0–52.0)
Hemoglobin: 14.5 g/dL (ref 13.0–17.0)
MCH: 29 pg (ref 26.0–34.0)
MCHC: 34.8 g/dL (ref 30.0–36.0)
MCV: 83.4 fL (ref 78.0–100.0)
PLATELETS: 205 10*3/uL (ref 150–400)
RBC: 5 MIL/uL (ref 4.22–5.81)
RDW: 14.9 % (ref 11.5–15.5)
WBC: 6.4 10*3/uL (ref 4.0–10.5)

## 2016-08-06 LAB — ABO/RH: ABO/RH(D): O POS

## 2016-08-06 LAB — SURGICAL PCR SCREEN
MRSA, PCR: NEGATIVE
Staphylococcus aureus: NEGATIVE

## 2016-08-10 NOTE — Anesthesia Preprocedure Evaluation (Addendum)
Anesthesia Evaluation  Patient identified by MRN, date of birth, ID band Patient awake    Reviewed: Allergy & Precautions, H&P , NPO status , Patient's Chart, lab work & pertinent test results  Airway Mallampati: III  TM Distance: >3 FB Neck ROM: Full    Dental no notable dental hx. (+) Teeth Intact, Dental Advisory Given   Pulmonary sleep apnea , former smoker,    Pulmonary exam normal breath sounds clear to auscultation       Cardiovascular hypertension, Pt. on medications negative cardio ROS   Rhythm:Regular Rate:Normal     Neuro/Psych negative neurological ROS  negative psych ROS   GI/Hepatic Neg liver ROS, GERD  Medicated and Controlled,  Endo/Other  Morbid obesity  Renal/GU negative Renal ROS  negative genitourinary   Musculoskeletal  (+) Arthritis , Osteoarthritis,    Abdominal   Peds  Hematology negative hematology ROS (+)   Anesthesia Other Findings   Reproductive/Obstetrics negative OB ROS                           Anesthesia Physical Anesthesia Plan  ASA: III  Anesthesia Plan: Spinal   Post-op Pain Management:    Induction: Intravenous  Airway Management Planned: Simple Face Mask  Additional Equipment:   Intra-op Plan:   Post-operative Plan:   Informed Consent: I have reviewed the patients History and Physical, chart, labs and discussed the procedure including the risks, benefits and alternatives for the proposed anesthesia with the patient or authorized representative who has indicated his/her understanding and acceptance.   Dental advisory given  Plan Discussed with: CRNA  Anesthesia Plan Comments:         Anesthesia Quick Evaluation

## 2016-08-11 ENCOUNTER — Encounter (HOSPITAL_COMMUNITY): Payer: Self-pay | Admitting: *Deleted

## 2016-08-11 ENCOUNTER — Inpatient Hospital Stay (HOSPITAL_COMMUNITY): Payer: BC Managed Care – PPO | Admitting: Anesthesiology

## 2016-08-11 ENCOUNTER — Encounter (HOSPITAL_COMMUNITY)
Admission: RE | Disposition: A | Discharge status: Home / Self Care | Payer: Self-pay | Source: Ambulatory Visit | Attending: Orthopedic Surgery

## 2016-08-11 ENCOUNTER — Inpatient Hospital Stay (HOSPITAL_COMMUNITY)
Admission: RE | Admit: 2016-08-11 | Discharge: 2016-08-12 | DRG: 470 | Disposition: A | Discharge status: Home / Self Care | Payer: BC Managed Care – PPO | Source: Ambulatory Visit | Attending: Orthopedic Surgery | Admitting: Orthopedic Surgery

## 2016-08-11 DIAGNOSIS — K219 Gastro-esophageal reflux disease without esophagitis: Secondary | ICD-10-CM | POA: Diagnosis present

## 2016-08-11 DIAGNOSIS — Z87891 Personal history of nicotine dependence: Secondary | ICD-10-CM

## 2016-08-11 DIAGNOSIS — G4733 Obstructive sleep apnea (adult) (pediatric): Secondary | ICD-10-CM | POA: Diagnosis present

## 2016-08-11 DIAGNOSIS — M1712 Unilateral primary osteoarthritis, left knee: Principal | ICD-10-CM | POA: Diagnosis present

## 2016-08-11 DIAGNOSIS — I10 Essential (primary) hypertension: Secondary | ICD-10-CM | POA: Diagnosis present

## 2016-08-11 DIAGNOSIS — Z880 Allergy status to penicillin: Secondary | ICD-10-CM | POA: Diagnosis not present

## 2016-08-11 DIAGNOSIS — Z96652 Presence of left artificial knee joint: Secondary | ICD-10-CM

## 2016-08-11 DIAGNOSIS — E785 Hyperlipidemia, unspecified: Secondary | ICD-10-CM | POA: Diagnosis present

## 2016-08-11 DIAGNOSIS — Z96659 Presence of unspecified artificial knee joint: Secondary | ICD-10-CM

## 2016-08-11 HISTORY — PX: TOTAL KNEE ARTHROPLASTY: SHX125

## 2016-08-11 LAB — TYPE AND SCREEN
ABO/RH(D): O POS
ANTIBODY SCREEN: NEGATIVE

## 2016-08-11 SURGERY — ARTHROPLASTY, KNEE, TOTAL
Anesthesia: Spinal | Site: Knee | Laterality: Left

## 2016-08-11 MED ORDER — MENTHOL 3 MG MT LOZG: 1.0000 | LOZENGE | OROMUCOSAL | Status: DC | PRN

## 2016-08-11 MED ORDER — STERILE WATER FOR IRRIGATION IR SOLN
Status: DC | PRN
  Administered 2016-08-11: 2000 mL

## 2016-08-11 MED ORDER — HYDROMORPHONE HCL 1 MG/ML IJ SOLN: 0.5000 mg | INTRAMUSCULAR | Status: DC | PRN

## 2016-08-11 MED ORDER — MIDAZOLAM HCL 2 MG/2ML IJ SOLN
INTRAMUSCULAR | Status: AC
  Filled 2016-08-11: qty 2

## 2016-08-11 MED ORDER — CELECOXIB 200 MG PO CAPS
200.0000 mg | ORAL_CAPSULE | Freq: Two times a day (BID) | ORAL | Status: DC
  Administered 2016-08-11 – 2016-08-12 (×3): 200 mg via ORAL
  Filled 2016-08-11 (×3): qty 1

## 2016-08-11 MED ORDER — SODIUM CHLORIDE 0.9 % IJ SOLN
INTRAMUSCULAR | Status: DC | PRN
  Administered 2016-08-11: 29 mL via INTRAVENOUS

## 2016-08-11 MED ORDER — TRANEXAMIC ACID 1000 MG/10ML IV SOLN
1000.0000 mg | INTRAVENOUS | Status: AC
  Administered 2016-08-11: 1000 mg via INTRAVENOUS
  Filled 2016-08-11: qty 10

## 2016-08-11 MED ORDER — PROPOFOL 500 MG/50ML IV EMUL
INTRAVENOUS | Status: DC | PRN
  Administered 2016-08-11: 50 ug/kg/min via INTRAVENOUS

## 2016-08-11 MED ORDER — LACTATED RINGERS IV SOLN
INTRAVENOUS | Status: DC | PRN
  Administered 2016-08-11 (×3): via INTRAVENOUS

## 2016-08-11 MED ORDER — DIPHENHYDRAMINE HCL 25 MG PO CAPS: 25.0000 mg | ORAL_CAPSULE | Freq: Four times a day (QID) | ORAL | Status: DC | PRN

## 2016-08-11 MED ORDER — PROPOFOL 10 MG/ML IV BOLUS
INTRAVENOUS | Status: AC
  Filled 2016-08-11: qty 20

## 2016-08-11 MED ORDER — SODIUM CHLORIDE 0.9 % IJ SOLN
INTRAMUSCULAR | Status: AC
  Filled 2016-08-11: qty 50

## 2016-08-11 MED ORDER — KETOROLAC TROMETHAMINE 30 MG/ML IJ SOLN
INTRAMUSCULAR | Status: DC | PRN
  Administered 2016-08-11: 30 mg

## 2016-08-11 MED ORDER — METHOCARBAMOL 1000 MG/10ML IJ SOLN
500.0000 mg | Freq: Four times a day (QID) | INTRAVENOUS | Status: DC | PRN
  Administered 2016-08-11: 500 mg via INTRAVENOUS
  Filled 2016-08-11: qty 5
  Filled 2016-08-11: qty 550

## 2016-08-11 MED ORDER — BUPIVACAINE HCL (PF) 0.25 % IJ SOLN
INTRAMUSCULAR | Status: AC
  Filled 2016-08-11: qty 30

## 2016-08-11 MED ORDER — ONDANSETRON HCL 4 MG PO TABS: 4.0000 mg | ORAL_TABLET | Freq: Four times a day (QID) | ORAL | Status: DC | PRN

## 2016-08-11 MED ORDER — FENTANYL CITRATE (PF) 100 MCG/2ML IJ SOLN
INTRAMUSCULAR | Status: AC
  Filled 2016-08-11: qty 2

## 2016-08-11 MED ORDER — METOCLOPRAMIDE HCL 5 MG PO TABS: 5.0000 mg | ORAL_TABLET | Freq: Three times a day (TID) | ORAL | Status: DC | PRN

## 2016-08-11 MED ORDER — VANCOMYCIN HCL 10 G IV SOLR
1500.0000 mg | INTRAVENOUS | Status: AC
  Administered 2016-08-11: 1500 mg via INTRAVENOUS
  Filled 2016-08-11: qty 1500

## 2016-08-11 MED ORDER — FERROUS SULFATE 325 (65 FE) MG PO TABS
325.0000 mg | ORAL_TABLET | Freq: Three times a day (TID) | ORAL | Status: DC
  Administered 2016-08-11 – 2016-08-12 (×4): 325 mg via ORAL
  Filled 2016-08-11 (×4): qty 1

## 2016-08-11 MED ORDER — ROSUVASTATIN CALCIUM 5 MG PO TABS
10.0000 mg | ORAL_TABLET | Freq: Every day | ORAL | Status: DC
  Administered 2016-08-11: 10 mg via ORAL
  Filled 2016-08-11 (×2): qty 2

## 2016-08-11 MED ORDER — 0.9 % SODIUM CHLORIDE (POUR BTL) OPTIME
TOPICAL | Status: DC | PRN
  Administered 2016-08-11: 1000 mL

## 2016-08-11 MED ORDER — FENTANYL CITRATE (PF) 100 MCG/2ML IJ SOLN
INTRAMUSCULAR | Status: DC | PRN
  Administered 2016-08-11: 100 ug via INTRAVENOUS

## 2016-08-11 MED ORDER — PHENOL 1.4 % MT LIQD
1.0000 | OROMUCOSAL | Status: DC | PRN
  Filled 2016-08-11: qty 177

## 2016-08-11 MED ORDER — DOCUSATE SODIUM 100 MG PO CAPS
100.0000 mg | ORAL_CAPSULE | Freq: Two times a day (BID) | ORAL | Status: DC
  Administered 2016-08-11 – 2016-08-12 (×2): 100 mg via ORAL
  Filled 2016-08-11 (×2): qty 1

## 2016-08-11 MED ORDER — HYDROCODONE-ACETAMINOPHEN 7.5-325 MG PO TABS
1.0000 | ORAL_TABLET | ORAL | Status: DC
  Administered 2016-08-11 (×2): 1 via ORAL
  Administered 2016-08-11 (×3): 2 via ORAL
  Administered 2016-08-12: 1 via ORAL
  Administered 2016-08-12 (×2): 2 via ORAL
  Filled 2016-08-11 (×4): qty 2
  Filled 2016-08-11 (×3): qty 1
  Filled 2016-08-11: qty 2

## 2016-08-11 MED ORDER — BUPIVACAINE HCL (PF) 0.25 % IJ SOLN
INTRAMUSCULAR | Status: DC | PRN
  Administered 2016-08-11: 30 mL

## 2016-08-11 MED ORDER — ONDANSETRON HCL 4 MG/2ML IJ SOLN
INTRAMUSCULAR | Status: AC
  Filled 2016-08-11: qty 2

## 2016-08-11 MED ORDER — ASPIRIN 81 MG PO CHEW
81.0000 mg | CHEWABLE_TABLET | Freq: Two times a day (BID) | ORAL | Status: DC
  Administered 2016-08-11 – 2016-08-12 (×2): 81 mg via ORAL
  Filled 2016-08-11 (×2): qty 1

## 2016-08-11 MED ORDER — HYDROMORPHONE HCL 1 MG/ML IJ SOLN: 0.2500 mg | INTRAMUSCULAR | Status: DC | PRN

## 2016-08-11 MED ORDER — MAGNESIUM CITRATE PO SOLN: 1.0000 | Freq: Once | ORAL | Status: DC | PRN

## 2016-08-11 MED ORDER — ALUM & MAG HYDROXIDE-SIMETH 200-200-20 MG/5ML PO SUSP: 30.0000 mL | ORAL | Status: DC | PRN

## 2016-08-11 MED ORDER — SUCRALFATE 1 G PO TABS
1.0000 g | ORAL_TABLET | Freq: Three times a day (TID) | ORAL | Status: DC
  Administered 2016-08-11 – 2016-08-12 (×5): 1 g via ORAL
  Filled 2016-08-11 (×5): qty 1

## 2016-08-11 MED ORDER — CHLORHEXIDINE GLUCONATE 4 % EX LIQD: 60.0000 mL | Freq: Once | CUTANEOUS | Status: DC

## 2016-08-11 MED ORDER — METOCLOPRAMIDE HCL 5 MG/ML IJ SOLN: 5.0000 mg | Freq: Three times a day (TID) | INTRAMUSCULAR | Status: DC | PRN

## 2016-08-11 MED ORDER — LIDOCAINE 2% (20 MG/ML) 5 ML SYRINGE
INTRAMUSCULAR | Status: DC | PRN
  Administered 2016-08-11: 50 mg via INTRAVENOUS

## 2016-08-11 MED ORDER — KETOROLAC TROMETHAMINE 30 MG/ML IJ SOLN
INTRAMUSCULAR | Status: AC
  Filled 2016-08-11: qty 1

## 2016-08-11 MED ORDER — LACTATED RINGERS IV SOLN
INTRAVENOUS | Status: DC
Start: 2016-08-11 — End: 2016-08-11

## 2016-08-11 MED ORDER — BUPIVACAINE IN DEXTROSE 0.75-8.25 % IT SOLN
INTRATHECAL | Status: DC | PRN
  Administered 2016-08-11: 2 mL via INTRATHECAL

## 2016-08-11 MED ORDER — DEXAMETHASONE SODIUM PHOSPHATE 10 MG/ML IJ SOLN
10.0000 mg | Freq: Once | INTRAMUSCULAR | Status: AC
  Administered 2016-08-12: 10 mg via INTRAVENOUS
  Filled 2016-08-11: qty 1

## 2016-08-11 MED ORDER — TAMSULOSIN HCL 0.4 MG PO CAPS
0.4000 mg | ORAL_CAPSULE | Freq: Every day | ORAL | Status: DC
  Administered 2016-08-11: 0.4 mg via ORAL
  Filled 2016-08-11: qty 1

## 2016-08-11 MED ORDER — LIDOCAINE 2% (20 MG/ML) 5 ML SYRINGE
INTRAMUSCULAR | Status: AC
  Filled 2016-08-11: qty 5

## 2016-08-11 MED ORDER — SODIUM CHLORIDE 0.9 % IV SOLN
INTRAVENOUS | Status: DC
  Administered 2016-08-11: 12:00:00 via INTRAVENOUS
  Filled 2016-08-11 (×5): qty 1000

## 2016-08-11 MED ORDER — PANTOPRAZOLE SODIUM 40 MG PO TBEC
40.0000 mg | DELAYED_RELEASE_TABLET | Freq: Every day | ORAL | Status: DC
  Administered 2016-08-11 – 2016-08-12 (×2): 40 mg via ORAL
  Filled 2016-08-11 (×2): qty 1

## 2016-08-11 MED ORDER — ONDANSETRON HCL 4 MG/2ML IJ SOLN: 4.0000 mg | Freq: Four times a day (QID) | INTRAMUSCULAR | Status: DC | PRN

## 2016-08-11 MED ORDER — SODIUM CHLORIDE 0.9 % IR SOLN
Status: DC | PRN
  Administered 2016-08-11: 1000 mL

## 2016-08-11 MED ORDER — BISACODYL 10 MG RE SUPP: 10.0000 mg | Freq: Every day | RECTAL | Status: DC | PRN

## 2016-08-11 MED ORDER — ONDANSETRON HCL 4 MG/2ML IJ SOLN
INTRAMUSCULAR | Status: DC | PRN
  Administered 2016-08-11: 4 mg via INTRAVENOUS

## 2016-08-11 MED ORDER — DEXAMETHASONE SODIUM PHOSPHATE 10 MG/ML IJ SOLN
10.0000 mg | Freq: Once | INTRAMUSCULAR | Status: AC
  Administered 2016-08-11: 10 mg via INTRAVENOUS

## 2016-08-11 MED ORDER — EPHEDRINE SULFATE-NACL 50-0.9 MG/10ML-% IV SOSY
PREFILLED_SYRINGE | INTRAVENOUS | Status: DC | PRN
  Administered 2016-08-11 (×2): 10 mg via INTRAVENOUS

## 2016-08-11 MED ORDER — VANCOMYCIN HCL IN DEXTROSE 1-5 GM/200ML-% IV SOLN
1000.0000 mg | Freq: Two times a day (BID) | INTRAVENOUS | Status: AC
  Administered 2016-08-11: 1000 mg via INTRAVENOUS
  Filled 2016-08-11: qty 200

## 2016-08-11 MED ORDER — POLYETHYLENE GLYCOL 3350 17 G PO PACK
17.0000 g | PACK | Freq: Two times a day (BID) | ORAL | Status: DC
  Administered 2016-08-11 – 2016-08-12 (×2): 17 g via ORAL
  Filled 2016-08-11 (×2): qty 1

## 2016-08-11 MED ORDER — MIDAZOLAM HCL 5 MG/5ML IJ SOLN
INTRAMUSCULAR | Status: DC | PRN
  Administered 2016-08-11: 2 mg via INTRAVENOUS

## 2016-08-11 MED ORDER — DEXAMETHASONE SODIUM PHOSPHATE 10 MG/ML IJ SOLN
INTRAMUSCULAR | Status: AC
  Filled 2016-08-11: qty 1

## 2016-08-11 MED ORDER — METHOCARBAMOL 500 MG PO TABS
500.0000 mg | ORAL_TABLET | Freq: Four times a day (QID) | ORAL | Status: DC | PRN
Start: 2016-08-11 — End: 2016-08-12

## 2016-08-11 SURGICAL SUPPLY — 44 items
BAG ZIPLOCK 12X15 (MISCELLANEOUS) IMPLANT
BANDAGE ACE 6X5 VEL STRL LF (GAUZE/BANDAGES/DRESSINGS) ×2 IMPLANT
BLADE SAW SGTL 13.0X1.19X90.0M (BLADE) ×2 IMPLANT
BOWL SMART MIX CTS (DISPOSABLE) ×2 IMPLANT
CAPT KNEE TOTAL 3 ATTUNE ×2 IMPLANT
CEMENT HV SMART SET (Cement) ×4 IMPLANT
CLOTH BEACON ORANGE TIMEOUT ST (SAFETY) ×2 IMPLANT
CUFF TOURN SGL QUICK 34 (TOURNIQUET CUFF) ×1
CUFF TRNQT CYL 34X4X40X1 (TOURNIQUET CUFF) ×1 IMPLANT
DECANTER SPIKE VIAL GLASS SM (MISCELLANEOUS) ×2 IMPLANT
DERMABOND ADVANCED (GAUZE/BANDAGES/DRESSINGS) ×1
DERMABOND ADVANCED .7 DNX12 (GAUZE/BANDAGES/DRESSINGS) ×1 IMPLANT
DRAPE U-SHAPE 47X51 STRL (DRAPES) ×2 IMPLANT
DRSG AQUACEL AG ADV 3.5X10 (GAUZE/BANDAGES/DRESSINGS) ×2 IMPLANT
DURAPREP 26ML APPLICATOR (WOUND CARE) ×4 IMPLANT
ELECT REM PT RETURN 9FT ADLT (ELECTROSURGICAL) ×2
ELECTRODE REM PT RTRN 9FT ADLT (ELECTROSURGICAL) ×1 IMPLANT
GLOVE BIOGEL PI IND STRL 7.0 (GLOVE) ×1 IMPLANT
GLOVE BIOGEL PI IND STRL 7.5 (GLOVE) ×4 IMPLANT
GLOVE BIOGEL PI IND STRL 8.5 (GLOVE) ×2 IMPLANT
GLOVE BIOGEL PI INDICATOR 7.0 (GLOVE) ×1
GLOVE BIOGEL PI INDICATOR 7.5 (GLOVE) ×4
GLOVE BIOGEL PI INDICATOR 8.5 (GLOVE) ×2
GLOVE ECLIPSE 8.0 STRL XLNG CF (GLOVE) ×4 IMPLANT
GLOVE ORTHO TXT STRL SZ7.5 (GLOVE) ×2 IMPLANT
GLOVE SURG SS PI 7.0 STRL IVOR (GLOVE) ×2 IMPLANT
GLOVE SURG SS PI 7.5 STRL IVOR (GLOVE) ×4 IMPLANT
GOWN STRL REUS W/TWL LRG LVL3 (GOWN DISPOSABLE) ×2 IMPLANT
GOWN STRL REUS W/TWL XL LVL3 (GOWN DISPOSABLE) ×2 IMPLANT
HANDPIECE INTERPULSE COAX TIP (DISPOSABLE) ×1
MANIFOLD NEPTUNE II (INSTRUMENTS) ×2 IMPLANT
PACK TOTAL KNEE CUSTOM (KITS) ×2 IMPLANT
POSITIONER SURGICAL ARM (MISCELLANEOUS) ×2 IMPLANT
SET HNDPC FAN SPRY TIP SCT (DISPOSABLE) ×1 IMPLANT
SET PAD KNEE POSITIONER (MISCELLANEOUS) ×2 IMPLANT
SUT MNCRL AB 4-0 PS2 18 (SUTURE) ×2 IMPLANT
SUT VIC AB 1 CT1 36 (SUTURE) ×2 IMPLANT
SUT VIC AB 2-0 CT1 27 (SUTURE) ×3
SUT VIC AB 2-0 CT1 TAPERPNT 27 (SUTURE) ×3 IMPLANT
SUT VLOC 180 0 24IN GS25 (SUTURE) ×2 IMPLANT
SYR 50ML LL SCALE MARK (SYRINGE) ×2 IMPLANT
TRAY FOLEY W/METER SILVER 16FR (SET/KITS/TRAYS/PACK) ×2 IMPLANT
WRAP KNEE MAXI GEL POST OP (GAUZE/BANDAGES/DRESSINGS) ×2 IMPLANT
YANKAUER SUCT BULB TIP 10FT TU (MISCELLANEOUS) ×2 IMPLANT

## 2016-08-11 NOTE — Transfer of Care (Signed)
Immediate Anesthesia Transfer of Care Note  Patient: [name]  Procedure(s) Performed: Procedure(s): LEFT TOTAL KNEE ARTHROPLASTY (Left)  Patient Location: PACU  Anesthesia Type:Spinal  Level of Consciousness: awake, alert  and oriented  Airway & Oxygen Therapy: Patient Spontanous Breathing and Patient connected to nasal cannula oxygen  Post-op Assessment: Report given to RN and Post -op Vital signs reviewed and stable  Post vital signs: Reviewed and stable  Last Vitals:  Vitals:   08/11/16 0604  BP: 120/72  Pulse: 66  Resp: 18  Temp: 36.6 C    Last Pain:  Vitals:   08/11/16 0606  TempSrc:   PainSc: 0-No pain      Patients Stated Pain Goal: 4 ([id number])  Complications: No apparent anesthesia complications

## 2016-08-11 NOTE — Op Note (Signed)
NAME:  Gregg Smith                      MEDICAL RECORD NO.:  [id number]                             FACILITY:  [facility]      PHYSICIAN:  Pietro Cassis. Alvan Dame, M.D.  DATE OF BIRTH:  25-May-1946      DATE OF PROCEDURE:  08/11/2016                                     OPERATIVE REPORT         PREOPERATIVE DIAGNOSIS:  Left knee osteoarthritis.      POSTOPERATIVE DIAGNOSIS:  Left knee osteoarthritis.      FINDINGS:  The patient was noted to have complete loss of cartilage and   bone-on-bone arthritis with associated osteophytes in the lateral and patellofemoral compartments of the knee with a significant synovitis and associated effusion.      PROCEDURE:  Left total knee replacement.      COMPONENTS USED:  DePuy Attune rotating platform posterior stabilized knee   system, a size 9 femur, 8 tibia, size 7 PS AOX insert, and 41 anatomic patellar   button.      SURGEON:  Pietro Cassis. Alvan Dame, M.D.      ASSISTANT:  Danae Orleans, PA-C.      ANESTHESIA:  Spinal.      SPECIMENS:  None.      COMPLICATION:  None.      DRAINS:  None.  EBL: <100cc      TOURNIQUET TIME:   Total Tourniquet Time Documented: Thigh (Left) - 52 minutes Total: Thigh (Left) - 52 minutes  .      The patient was stable to the recovery room.      INDICATION FOR PROCEDURE:  Gregg Smith is a 70 y.o. male patient of   mine.  The patient had been seen, evaluated, and treated conservatively in the   office with medication, activity modification, and injections.  The patient had   radiographic changes of bone-on-bone arthritis with endplate sclerosis and osteophytes noted.      The patient failed conservative measures including medication, injections, and activity modification, and at this point was ready for more definitive measures.   Based on the radiographic changes and failed conservative measures, the patient   decided to proceed with total knee replacement.  Risks of infection,   DVT, component failure,  need for revision surgery, postop course, and   expectations were all   discussed and reviewed.  Consent was obtained for benefit of pain   relief.      PROCEDURE IN DETAIL:  The patient was brought to the operative theater.   Once adequate anesthesia, preoperative antibiotics, 1 gm of Vancomycin, 1 gm of Tranexamic Acid, and 10 mg of Decadron administered, the patient was positioned supine with the left thigh tourniquet placed.  The  left lower extremity was prepped and draped in sterile fashion.  A time-   out was performed identifying the patient, planned procedure, and   extremity.      The left lower extremity was placed in the [facility] leg holder.  The leg was   exsanguinated, tourniquet elevated to 250 mmHg.  A midline incision was   made followed by median  parapatellar arthrotomy.  Following initial   exposure, attention was first directed to the patella.  Precut   measurement was noted to be 29 mm.  I resected down to 16-17 mm and used a   41 anatomic patellar button to restore patellar height as well as cover the cut   surface.      The lug holes were drilled and a metal shim was placed to protect the   patella from retractors and saw blades.      At this point, attention was now directed to the femur.  The femoral   canal was opened with a drill, irrigated to try to prevent fat emboli.  An   intramedullary rod was passed at 5 degrees valgus, 11 mm of bone was   resected off the distal femur.  Following this resection, the tibia was   subluxated anteriorly.  Using the extramedullary guide, 2 mm of bone was resected off   the proximal lateral tibia.  We confirmed the gap would be   stable medially and laterally with a size 6 insert as well as confirmed   the cut was perpendicular in the coronal plane, checking with an alignment rod.      Once this was done, I sized the femur to be a size 9 in the anterior-   posterior dimension, chose a standard component based on medial and    lateral dimension.  The size 9 rotation block was then pinned in   position anterior referenced using the C-clamp to set rotation.  The   anterior, posterior, and  chamfer cuts were made without difficulty nor   notching making certain that I was along the anterior cortex to help   with flexion gap stability.      The final box cut was made off the lateral aspect of distal femur.      At this point, the tibia was sized to be a size 8, the size 8 tray was   then pinned in position through the medial third of the tubercle,   drilled, and keel punched.  Trial reduction was now carried with a 9 femur,  8 tibia, a size 7 PS insert, and the 41 anatomic patella botton.  The knee was brought to   extension, full extension with good flexion stability with the patella   tracking through the trochlea without application of pressure.  Given   all these findings the femoral lug holes were drilled and then the trial components removed.  Final components were   opened and cement was mixed.  The knee was irrigated with normal saline   solution and pulse lavage.  The synovial lining was   then injected with 30 cc of 0.25% Marcaine without epinephrine and 1 cc of Toradol plus 30 cc of NS for a total of 61 cc.      The knee was irrigated.  Final implants were then cemented onto clean and   dried cut surfaces of bone with the knee brought to extension with a size 7PS trial insert.      Once the cement had fully cured, the excess cement was removed   throughout the knee.  I confirmed I was satisfied with the range of   motion and stability, and the final size 7 PS AOX insert was chosen.  It was   placed into the knee.      The tourniquet had been let down at 52 minutes.  No significant   hemostasis required.  The   extensor mechanism was then reapproximated using #1 Vicryl and #0 V-lock sutures with the knee   in flexion.  The   remaining wound was closed with 2-0 Vicryl and running 4-0 Monocryl.   The  knee was cleaned, dried, dressed sterilely using Dermabond and   Aquacel dressing.  The patient was then   brought to recovery room in stable condition, tolerating the procedure   well.   Please note that Physician Assistant, Danae Orleans, PA-C, was present for the entirety of the case, and was utilized for pre-operative positioning, peri-operative retractor management, general facilitation of the procedure.  He was also utilized for primary wound closure at the end of the case.              Pietro Cassis Alvan Dame, M.D.    08/11/2016 9:20 AM

## 2016-08-11 NOTE — Discharge Instructions (Signed)

## 2016-08-11 NOTE — Interval H&P Note (Signed)
History and Physical Interval Note:  08/11/2016 7:23 AM  Gregg Smith  has presented today for surgery, with the diagnosis of LEFT KNEE OA  The various methods of treatment have been discussed with the patient and family. After consideration of risks, benefits and other options for treatment, the patient has consented to  Procedure(s): LEFT TOTAL KNEE ARTHROPLASTY (Left) as a surgical intervention .  The patient's history has been reviewed, patient examined, no change in status, stable for surgery.  I have reviewed the patient's chart and labs.  Questions were answered to the patient's satisfaction.     Mauri Pole

## 2016-08-11 NOTE — Evaluation (Signed)
Physical Therapy Evaluation Patient Details Name: Gregg Smith MRN: [id number] DOB: [birth year] Today's Date: 08/11/2016   History of Present Illness  L TKA  Clinical Impression  Pt is s/p TKA resulting in the deficits listed below (see PT Problem List). Pt ambulated 97' with RW and performed L TKA exercises with min A. Good progress expected.  Pt will benefit from skilled PT to increase their independence and safety with mobility to allow discharge to the venue listed below.      Follow Up Recommendations Home health PT    Equipment Recommendations  Rolling walker with 5" wheels;3in1 (PT)    Recommendations for Other Services OT consult     Precautions / Restrictions Precautions Precautions: Knee Precaution Comments: reviewed no pillow under L knee Restrictions Weight Bearing Restrictions: No Other Position/Activity Restrictions: WBAT LLE      Mobility  Bed Mobility Overal bed mobility: Needs Assistance Bed Mobility: Supine to Sit     Supine to sit: Min guard;HOB elevated     General bed mobility comments: instructed pt to self assist LLE with RLE, min guard for RLE  Transfers Overall transfer level: Needs assistance Equipment used: Rolling walker (2 wheeled) Transfers: Sit to/from Stand Sit to Stand: Min assist;From elevated surface         General transfer comment: VCs hand placement, min A to rise  Ambulation/Gait Ambulation/Gait assistance: Min guard Ambulation Distance (Feet): 50 Feet Assistive device: Rolling walker (2 wheeled) Gait Pattern/deviations: Step-to pattern;Decreased step length - left;Decreased weight shift to left   Gait velocity interpretation: Below normal speed for age/gender General Gait Details: VCs sequencing and positioning in RW, no LOB  Stairs            Wheelchair Mobility    Modified Rankin (Stroke Patients Only)       Balance Overall balance assessment: Modified Independent                                            Pertinent Vitals/Pain Pain Assessment: 0-10 Pain Score: 7  Pain Location: L knee Pain Descriptors / Indicators: Sore Pain Intervention(s): Limited activity within patient's tolerance;Monitored during session;Premedicated before session;Ice applied    Home Living Family/patient expects to be discharged to:: Private residence Living Arrangements: Spouse/significant other Available Help at Discharge: Family;Available 24 hours/day Type of Home: House Home Access: Stairs to enter Entrance Stairs-Rails: None Entrance Stairs-Number of Steps: 4 Home Layout: Two level;Bed/bath upstairs Home Equipment: None      Prior Function Level of Independence: Independent               Hand Dominance        Extremity/Trunk Assessment   Upper Extremity Assessment: Overall WFL for tasks assessed           Lower Extremity Assessment: LLE deficits/detail   LLE Deficits / Details: L knee 15-50* AAROM, SLR 3/5  Cervical / Trunk Assessment: Normal  Communication   Communication: No difficulties  Cognition Arousal/Alertness: Awake/alert Behavior During Therapy: WFL for tasks assessed/performed Overall Cognitive Status: Within Functional Limits for tasks assessed                      General Comments      Exercises Total Joint Exercises Ankle Circles/Pumps: AROM;Both;10 reps Quad Sets: AROM;Both;5 reps;Supine Heel Slides: AAROM;Left;10 reps;Supine Straight Leg Raises: AROM;Left;Other reps (comment) (x 1) Long Arc  Quad: AROM;Left;5 reps;Seated   Assessment/Plan    PT Assessment Patient needs continued PT services  PT Problem List Decreased strength;Decreased range of motion;Decreased activity tolerance;Decreased mobility;Decreased knowledge of use of DME;Pain          PT Treatment Interventions DME instruction;Gait training;Stair training;Functional mobility training;Balance training;Therapeutic exercise;Therapeutic  activities;Patient/family education    PT Goals (Current goals can be found in the Care Plan section)  Acute Rehab PT Goals 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[employer] PT Goal Formulation: With patient Time For Goal Achievement: 08/18/16 Potential to Achieve Goals: Good    Frequency 7X/week   Barriers to discharge        Co-evaluation               End of Session Equipment Utilized During Treatment: Gait belt Activity Tolerance: Patient tolerated treatment well Patient left: in chair;with call bell/phone within reach Nurse Communication: Mobility status         Time: 1405-1430 PT Time Calculation (min) (ACUTE ONLY): 25 min   Charges:   PT Evaluation $PT Eval Low Complexity: 1 Procedure PT Treatments $Gait Training: 8-22 mins   PT G Codes:        Philomena Doheny 08/11/2016, 2:38 PM [phone]

## 2016-08-11 NOTE — Anesthesia Procedure Notes (Signed)
Spinal  Patient location during procedure: OR End time: 08/11/2016 7:39 AM Staffing Resident/CRNA: Noralyn Pick D Performed: anesthesiologist and resident/CRNA  Preanesthetic Checklist Completed: patient identified, site marked, surgical consent, pre-op evaluation, timeout performed, IV checked, risks and benefits discussed and monitors and equipment checked Spinal Block Patient position: sitting Prep: Betadine Patient monitoring: heart rate, continuous pulse ox and blood pressure Approach: midline Location: L3-4 Injection technique: single-shot Needle Needle type: Sprotte  Needle gauge: 24 G Needle length: 9 cm Assessment Sensory level: T6 Additional Notes Expiration date of kit checked and confirmed. Patient tolerated procedure well, without complications.

## 2016-08-11 NOTE — Anesthesia Postprocedure Evaluation (Signed)
Anesthesia Post Note  Patient: Gregg Smith  Procedure(s) Performed: Procedure(s) (LRB): LEFT TOTAL KNEE ARTHROPLASTY (Left)  Patient location during evaluation: PACU Anesthesia Type: Spinal Level of consciousness: oriented and awake and alert Pain management: pain level controlled Vital Signs Assessment: post-procedure vital signs reviewed and stable Respiratory status: spontaneous breathing, respiratory function stable and patient connected to nasal cannula oxygen Cardiovascular status: blood pressure returned to baseline and stable Postop Assessment: no headache and no backache Anesthetic complications: no    Last Vitals:  Vitals:   08/11/16 1045 08/11/16 1058  BP:  113/73  Pulse:  (!) 59  Resp:  15  Temp: 36.5 C 36.4 C    Last Pain:  Vitals:   08/11/16 1045  TempSrc:   PainSc: 0-No pain                 Versie Fleener,W. EDMOND

## 2016-08-12 LAB — BASIC METABOLIC PANEL
Anion gap: 7 (ref 5–15)
BUN: 27 mg/dL — AB (ref 6–20)
CALCIUM: 8.1 mg/dL — AB (ref 8.9–10.3)
CO2: 23 mmol/L (ref 22–32)
CREATININE: 0.94 mg/dL (ref 0.61–1.24)
Chloride: 108 mmol/L (ref 101–111)
GFR calc non Af Amer: >60 mL/min (ref >60)
Glucose, Bld: 157 mg/dL — ABNORMAL HIGH (ref 65–99)
Potassium: 4.1 mmol/L (ref 3.5–5.1)
SODIUM: 138 mmol/L (ref 135–145)

## 2016-08-12 LAB — CBC
HCT: 36.9 % — ABNORMAL LOW (ref 39.0–52.0)
Hemoglobin: 12.2 g/dL — ABNORMAL LOW (ref 13.0–17.0)
MCH: 28.4 pg (ref 26.0–34.0)
MCHC: 33.1 g/dL (ref 30.0–36.0)
MCV: 86 fL (ref 78.0–100.0)
Platelets: 205 10*3/uL (ref 150–400)
RBC: 4.29 MIL/uL (ref 4.22–5.81)
RDW: 15.2 % (ref 11.5–15.5)
WBC: 16.7 10*3/uL — ABNORMAL HIGH (ref 4.0–10.5)

## 2016-08-12 MED ORDER — DOCUSATE SODIUM 100 MG PO CAPS: 100.0000 mg | ORAL_CAPSULE | Freq: Two times a day (BID) | ORAL | 0 refills | Status: DC

## 2016-08-12 MED ORDER — POLYETHYLENE GLYCOL 3350 17 G PO PACK: 17.0000 g | PACK | Freq: Two times a day (BID) | ORAL | 0 refills | Status: DC

## 2016-08-12 MED ORDER — METHOCARBAMOL 500 MG PO TABS: 500.0000 mg | ORAL_TABLET | Freq: Four times a day (QID) | ORAL | 0 refills | Status: DC | PRN

## 2016-08-12 MED ORDER — HYDROCODONE-ACETAMINOPHEN 7.5-325 MG PO TABS: 1.0000 | ORAL_TABLET | ORAL | 0 refills | Status: DC | PRN

## 2016-08-12 MED ORDER — ASPIRIN 81 MG PO CHEW: 81.0000 mg | CHEWABLE_TABLET | Freq: Two times a day (BID) | ORAL | 0 refills | Status: AC

## 2016-08-12 MED ORDER — FERROUS SULFATE 325 (65 FE) MG PO TABS: 325.0000 mg | ORAL_TABLET | Freq: Three times a day (TID) | ORAL | 3 refills | Status: DC

## 2016-08-12 MED ORDER — CELECOXIB 200 MG PO CAPS: 200.0000 mg | ORAL_CAPSULE | Freq: Two times a day (BID) | ORAL | 0 refills | Status: DC

## 2016-08-12 MED FILL — HYDROCODON-APAP 7.5-325: 7.5-325 | 5 days supply | Qty: 60 | Fill #0

## 2016-08-12 MED FILL — METHOCARBAMOL 500 MG TABLET: 500 | 10 days supply | Qty: 40 | Fill #0

## 2016-08-12 MED FILL — CELECOXIB 200 MG CAPSULE: 200 | 30 days supply | Qty: 60 | Fill #0

## 2016-08-12 NOTE — Care Management Note (Signed)
Case Management Note  Patient Details  Name: COLESTON DIROSA MRN: [id number] Date of Birth: 10-15-1945  Subjective/Objective:                  LEFT TOTAL KNEE ARTHROPLASTY (Left) Action/Plan: Discharge planning Expected Discharge Date:  08/12/16               Expected Discharge Plan:  Home/Self Care  In-House Referral:     Discharge planning Services  CM Consult  Post Acute Care Choice:  Durable Medical Equipment Choice offered to:  Patient  DME Arranged:  3-N-1, Walker rolling DME Agency:  [place]:  NA [place] Agency:  NA  Status of Service:  Completed, signed off  If discussed at [place] of Stay Meetings, dates discussed:    Additional Comments: CM met with pt in room to confirm plan is for outpt PT per MD note; pt confirms.  Cm notified [place] DME rep, to please deliver a wide 31n and rolling walker to room prior to discharge.  No other CM needs were communicated. Dellie Catholic, RN 08/12/2016, 1:08 PM

## 2016-08-12 NOTE — Progress Notes (Signed)
Patient ID: Gregg Smith, male   DOB: [birth year], 70 y.o.   MRN: [id number] Subjective: 1 Day Post-Op Procedure(s) (LRB): LEFT TOTAL KNEE ARTHROPLASTY (Left)    Patient reports pain as mild at this point.  Ready to proceed with therapy  Objective:   VITALS:   Vitals:   08/12/16 0250 08/12/16 0537  BP: 126/65 122/64  Pulse: 70 61  Resp: 16 16  Temp: 98.2 F (36.8 C) 98.1 F (36.7 C)    Neurovascular intact Incision: dressing C/D/I  LABS  Recent Labs  08/12/16 0521  HGB 12.2*  HCT 36.9*  WBC 16.7*  PLT 205     Recent Labs  08/12/16 0521  NA 138  K 4.1  BUN 27*  CREATININE 0.94  GLUCOSE 157*    No results for input(s): LABPT, INR in the last 72 hours.   Assessment/Plan: 1 Day Post-Op Procedure(s) (LRB): LEFT TOTAL KNEE ARTHROPLASTY (Left)   Advance diet Up with therapy with focus on stair climbing before discharge D/C to home with out patient therapy set up through our office Reviewed goals RTC in 2 weeks

## 2016-08-12 NOTE — Evaluation (Signed)
Occupational Therapy Evaluation Patient Details Name: Gregg Smith MRN: [id number] DOB: 03/25/1946 Today's Date: 08/12/2016    History of Present Illness L TKA   Clinical Impression   This 70 year old man was admitted for the above sx. All education was completed. No further OT is needed at this time.    Follow Up Recommendations  Supervision/Assistance - 24 hour    Equipment Recommendations  3 in 1 bedside commode (wide)    Recommendations for Other Services       Precautions / Restrictions Precautions Precautions: Knee Precaution Comments: reviewed no pillow under L knee Restrictions Weight Bearing Restrictions: No Other Position/Activity Restrictions: WBAT LLE      Mobility Bed Mobility               General bed mobility comments: performed by PT:  OOB  Transfers   Equipment used: Rolling walker (2 wheeled)   Sit to Stand: Min assist;Min guard         General transfer comment: depending on surface height.  Cues for UE/LE placement    Balance                                            ADL Overall ADL's : Needs assistance/impaired     Grooming: Supervision/safety;Standing       Lower Body Bathing: Moderate assistance;Sit to/from stand       Lower Body Dressing: Maximal assistance;Sit to/from stand   Toilet Transfer: Min guard;Minimal assistance;BSC;RW;Ambulation   Toileting- Water quality scientist and Hygiene: Min guard;Sit to/from stand         General ADL Comments: educated on reacher for ADLs as well as netted sponge on a stick.  Educated on tub readiness:  pt feels he will be OK To sponge bathe initially.  Fit onto standard 3:1 is tight:  would benefit from wider commode     Vision     Perception     Praxis      Pertinent Vitals/Pain Pain Assessment: Faces Faces Pain Scale: Hurts little more Pain Location: L knee Pain Descriptors / Indicators: Sore Pain Intervention(s): Limited activity  within patient's tolerance;Monitored during session;Premedicated before session;Repositioned;Ice applied     Hand Dominance     Extremity/Trunk Assessment Upper Extremity Assessment Upper Extremity Assessment: Overall WFL for tasks assessed           Communication Communication Communication: HOH   Cognition Arousal/Alertness: Awake/alert Behavior During Therapy: WFL for tasks assessed/performed Overall Cognitive Status: Within Functional Limits for tasks assessed                     General Comments       Exercises       Shoulder Instructions      Home Living Family/patient expects to be discharged to:: Private residence Living Arrangements: Spouse/significant other           Home Layout: Two level;Bed/bath upstairs     Bathroom Shower/Tub: Tub/shower unit Shower/tub characteristics: Architectural technologist: Standard     Home Equipment: None          Prior Functioning/Environment Level of Independence: Independent                 OT Problem List:     OT Treatment/Interventions:      OT Goals(Current goals can be found in the care plan section) Acute  Rehab OT Goals 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

## 2016-08-12 NOTE — Progress Notes (Signed)
Physical Therapy Treatment Patient Details Name: Gregg Smith MRN: [id number] DOB: [birth year] Today's Date: 08/12/2016    History of Present Illness L TKA    PT Comments    POD # 1 am session Applied KI and instructed on use.  Assisted OOB to amb in hallway.  Practiced stairs 2 different situations.  Will need to repeat stair training with spouse this afternoon before D/C.   Follow Up Recommendations  Home health PT     Equipment Recommendations  Rolling walker with 5" wheels;3in1 (PT)    Recommendations for Other Services       Precautions / Restrictions Precautions Precautions: Knee Precaution Comments: instructed on KI use for stairs, amb and side sleeping Restrictions Weight Bearing Restrictions: No Other Position/Activity Restrictions: WBAT LLE    Mobility  Bed Mobility Overal bed mobility: Needs Assistance Bed Mobility: Supine to Sit     Supine to sit: Min guard;HOB elevated     General bed mobility comments: assist with L LE only  Transfers Overall transfer level: Needs assistance Equipment used: Rolling walker (2 wheeled) Transfers: Sit to/from Stand Sit to Stand: Supervision;Min guard         General transfer comment: 25% VC's on proper tech, hand placement and LE advancement  Ambulation/Gait Ambulation/Gait assistance: Min guard Ambulation Distance (Feet): 54 Feet   Gait Pattern/deviations: Step-to pattern;Decreased stance time - left     General Gait Details: 25% VCs sequencing and positioning in RW,  50% VC's to decrease gait speed to increase safety/balance   Stairs Stairs: Yes     Number of Stairs: 8 General stair comments: first up 4 forward with one rail and one crutch (second floor BR).  Second up backward with walker no rails (front porch)  Wheelchair Mobility    Modified Rankin (Stroke Patients Only)       Balance                                    Cognition   Behavior During Therapy: WFL for  tasks assessed/performed Overall Cognitive Status: Within Functional Limits for tasks assessed                      Exercises   Total Knee Replacement TE's 10 reps B LE ankle pumps 10 reps towel squeezes 10 reps knee presses 10 reps heel slides  10 reps SAQ's 10 reps SLR's 10 reps ABD Followed by ICE     General Comments        Pertinent Vitals/Pain Pain Assessment: 0-10 Pain Score: 6  Pain Location: L knee Pain Descriptors / Indicators: Operative site guarding;Tender Pain Intervention(s): Monitored during session;Repositioned;Ice applied    Home Living                      Prior Function            PT Goals (current goals can now be found in the care plan section) Progress towards PT goals: Progressing toward goals    Frequency    7X/week      PT Plan Current plan remains appropriate    Co-evaluation             End of Session Equipment Utilized During Treatment: Gait belt Activity Tolerance: Patient tolerated treatment well Patient left: in bed;with call bell/phone within reach     Time: 1040-1120 PT Time Calculation (min) (ACUTE ONLY):  40 min  Charges:  $Gait Training: 8-22 mins $Therapeutic Exercise: 8-22 mins $Therapeutic Activity: 8-22 mins                    G Codes:      Rica Koyanagi  PTA [facility]  Acute  Rehab Pager      [phone]

## 2016-08-12 NOTE — Discharge Summary (Signed)
Physician Discharge Summary  Patient ID: Gregg Smith MRN: [id number] DOB/AGE: 1946/02/01 70 y.o.  Admit date: 08/11/2016 Discharge date: 08/12/2016   Procedures:  Procedure(s) (LRB): LEFT TOTAL KNEE ARTHROPLASTY (Left)  Attending Physician:  Dr. Paralee Smith   Admission Diagnoses:   Left knee primary OA / pain  Discharge Diagnoses:  Principal Problem:   S/P left TKA Active Problems:   S/P knee replacement  Past Medical History:  Diagnosis Date  . Allergy    seasonal   . Arthritis   . Barrett's esophagus   . Chicken pox   . Colon polyps    hyperplastic  . Diverticulosis   . GERD (gastroesophageal reflux disease)   . Hemorrhoids   . Hyperlipidemia   . Hypertension   . Leg cramps   . Measles   . Mumps   . Pneumonia   . Rhinitis   . Sleep apnea    dental device  . Snoring disorder 05/09/2013    HPI:    Gregg Smith, 70 y.o. male, has a history of pain and functional disability in the left knee due to arthritis and has failed non-surgical conservative treatments for greater than 12 weeks to includeNSAID's and/or analgesics, corticosteriod injections, use of assistive devices and activity modification.  Onset of symptoms was gradual, starting >10 years ago with gradually worsening course since that time. The patient noted no past surgery on the left knee(s).  Patient currently rates pain in the left knee(s) at 7 out of 10 with activity. Patient has night pain, worsening of pain with activity and weight bearing, pain that interferes with activities of daily living, pain with passive range of motion, crepitus and joint swelling.  Patient has evidence of periarticular osteophytes and joint space narrowing by imaging studies. There is no active infection.   Risks, benefits and expectations were discussed with the patient.  Risks including but not limited to the risk of anesthesia, blood clots, nerve damage, blood vessel damage, failure of the prosthesis, infection  and up to and including death.  Patient understand the risks, benefits and expectations and wishes to proceed with surgery.  PCP: Gregg Brunswick, PA-C   Discharged Condition: good  Hospital Course:  Patient underwent the above stated procedure on 08/11/2016. Patient tolerated the procedure well and brought to the recovery room in good condition and subsequently to the floor.  POD #1 BP: 122/64 ; Pulse: 61 ; Temp: 98.1 F (36.7 C) ; Resp: 16 Patient reports pain as mild at this point.  Ready to proceed with therapy. Ready to be discharged home.   LABS  Basename    HGB     12.2  HCT     36.9    Discharge Exam: General appearance: alert, cooperative and no distress Extremities: Homans sign is negative, no sign of DVT, no edema, redness or tenderness in the calves or thighs and no ulcers, gangrene or trophic changes  Disposition: Home with follow up in 2 weeks   Follow-up Information    Mauri Pole, MD. Schedule an appointment as soon as possible for a visit in 2 week(s).   Specialty:  Orthopedic Surgery Contact information: [address] [place] [address] [phone]        [facility] Follow up.   Contact information: [address] [place] [address] [phone]           Discharge Instructions    Call MD / Call 911  Complete by:  As directed    If you experience chest pain or shortness of breath, CALL 911 and be transported to the hospital emergency room.  If you develope a fever above 101 F, pus (white drainage) or increased drainage or redness at the wound, or calf pain, call your surgeon's office.   Change dressing    Complete by:  As directed    Maintain surgical dressing until follow up in the clinic. If the edges start to pull up, may reinforce with tape. If the dressing is no longer working, may remove and cover with gauze and tape, but must keep the area dry and clean.  Call with any questions or  concerns.   Constipation Prevention    Complete by:  As directed    Drink plenty of fluids.  Prune juice may be helpful.  You may use a stool softener, such as Colace (over the counter) 100 mg twice a day.  Use MiraLax (over the counter) for constipation as needed.   Diet - low sodium heart healthy    Complete by:  As directed    Discharge instructions    Complete by:  As directed    Maintain surgical dressing until follow up in the clinic. If the edges start to pull up, may reinforce with tape. If the dressing is no longer working, may remove and cover with gauze and tape, but must keep the area dry and clean.  Follow up in 2 weeks at [facility]. Call with any questions or concerns.   Increase activity slowly as tolerated    Complete by:  As directed    Weight bearing as tolerated with assist device (walker, cane, etc) as directed, use it as long as suggested by your surgeon or therapist, typically at least 4-6 weeks.   TED hose    Complete by:  As directed    Use stockings (TED hose) for 2 weeks on both leg(s).  You may remove them at night for sleeping.        Medication List    TAKE these medications   aspirin 81 MG chewable tablet Chew 1 tablet (81 mg total) by mouth 2 (two) times daily. Take for 4 weeks.   celecoxib 200 MG capsule Commonly known as:  CELEBREX Take 1 capsule (200 mg total) by mouth every 12 (twelve) hours. Take along with Pepcid to help prevent GI upset / ulcer.   docusate sodium 100 MG capsule Commonly known as:  COLACE Take 1 capsule (100 mg total) by mouth 2 (two) times daily.   ferrous sulfate 325 (65 FE) MG tablet Take 1 tablet (325 mg total) by mouth 3 (three) times daily after meals.   HYDROcodone-acetaminophen 7.5-325 MG tablet Commonly known as:  NORCO Take 1-2 tablets by mouth every 4 (four) hours as needed for moderate pain.   lansoprazole 30 MG capsule Commonly known as:  PREVACID TAKE ONE CAPSULE BY MOUTH EVERY DAY. What  changed:  how much to take  how to take this  when to take this  additional instructions   lisinopril-hydrochlorothiazide 10-12.5 MG tablet Commonly known as:  PRINZIDE,ZESTORETIC Take 1 tablet by mouth daily. What changed:  when to take this   methocarbamol 500 MG tablet Commonly known as:  ROBAXIN Take 1 tablet (500 mg total) by mouth every 6 (six) hours as needed for muscle spasms.   polyethylene glycol packet Commonly known as:  MIRALAX / GLYCOLAX Take 17 g by mouth 2 (two) times daily.  rosuvastatin 10 MG tablet Commonly known as:  CRESTOR Take 1 tablet (10 mg total) by mouth daily. What changed:  when to take this   sucralfate 1 g tablet Commonly known as:  CARAFATE TAKE 1 TABLET BY MOUTH 1 HOUR BEFORE MEALS AND AT BEDTIME What changed:  how much to take  how to take this  when to take this  additional instructions   tamsulosin 0.4 MG Caps capsule Commonly known as:  FLOMAX TAKE ONE CAPSULE BY MOUTH EVERY DAY What changed:  See the new instructions.            Durable Medical Equipment        Start     Ordered   08/12/16 1147  DME 3 n 1  Once    Comments:  bari or wide please as wt is 281   08/12/16 1146   08/12/16 0946  For home use only DME 3 n 1  Once     08/12/16 0948   08/12/16 0946  For home use only DME Walker rolling  Once    Question:  Patient needs a walker to treat with the following condition  Answer:  Osteoarthritis of knee   08/12/16 0948   08/11/16 1120  DME Walker rolling  Once    Question:  Patient needs a walker to treat with the following condition  Answer:  Status post left knee replacement   08/11/16 1120       Signed: [place] Pugh. Deane Wattenbarger   PA-C  08/12/2016, 3:59 PM

## 2016-08-12 NOTE — Progress Notes (Signed)
Physical Therapy Treatment Patient Details Name: MAXAMUS PERIC MRN: [id number] DOB: January 30, 1946 Today's Date: 08/12/2016    History of Present Illness L TKA    PT Comments    POD # 1 pm session with spouse for "hands on" instruction for all below listed  Follow Up Recommendations  Home health PT     Equipment Recommendations  Rolling walker with 5" wheels;3in1 (PT)    Recommendations for Other Services       Precautions / Restrictions Precautions Precautions: Knee Precaution Comments: instructed on KI use for stairs, amb and side sleeping Restrictions Weight Bearing Restrictions: No Other Position/Activity Restrictions: WBAT LLE    Mobility  Bed Mobility Overal bed mobility: Needs Assistance Bed Mobility: Supine to Sit     Supine to sit: Min guard;HOB elevated     General bed mobility comments: Pt OOB in recliner  Transfers Overall transfer level: Needs assistance Equipment used: Rolling walker (2 wheeled) Transfers: Sit to/from Stand Sit to Stand: Supervision;Min guard         General transfer comment: 25% VC's on proper tech, hand placement and LE advancement  Ambulation/Gait Ambulation/Gait assistance: Min guard Ambulation Distance (Feet): 48 Feet   Gait Pattern/deviations: Step-to pattern;Decreased stance time - left     General Gait Details: 25% VCs sequencing and positioning in RW,  50% VC's to decrease gait speed to increase safety/balance   Stairs Stairs: Yes     Number of Stairs: 16 General stair comments: first up 12 forward with one rail and one crutch (second floor BR).  Second up4 backward with walker no rails (front porch) with spouse for "hands on" instruction.  Handouts also given.  Wheelchair Mobility    Modified Rankin (Stroke Patients Only)       Balance                                    Cognition   Behavior During Therapy: WFL for tasks assessed/performed Overall Cognitive Status: Within  Functional Limits for tasks assessed                      Exercises      General Comments        Pertinent Vitals/Pain Pain Assessment: 0-10 Pain Score: 6  Pain Location: L knee Pain Descriptors / Indicators: Operative site guarding;Tender Pain Intervention(s): Monitored during session;Repositioned;Ice applied    Home Living                      Prior Function            PT Goals (current goals can now be found in the care plan section) Progress towards PT goals: Progressing toward goals    Frequency    7X/week      PT Plan Current plan remains appropriate    Co-evaluation             End of Session Equipment Utilized During Treatment: Gait belt Activity Tolerance: Patient tolerated treatment well Patient left: in bed;with call bell/phone within reach     Time: 1335-1415 PT Time Calculation (min) (ACUTE ONLY): 40 min  Charges:  $Gait Training: 23-37 mins $Therapeutic Activity: 8-22 mins                    G Codes:      Rica Koyanagi  PTA [facility]  Acute  Rehab Pager  [phone]  

## 2016-08-27 ENCOUNTER — Other Ambulatory Visit: Payer: Self-pay | Admitting: Physician Assistant

## 2016-09-22 ENCOUNTER — Ambulatory Visit (INDEPENDENT_AMBULATORY_CARE_PROVIDER_SITE_OTHER): Payer: BC Managed Care – PPO | Admitting: Emergency Medicine

## 2016-09-22 VITALS — BP 122/80 | HR 76 | Temp 98.2°F | Resp 16 | Ht 72.0 in | Wt 270.0 lb

## 2016-09-22 DIAGNOSIS — K219 Gastro-esophageal reflux disease without esophagitis: Secondary | ICD-10-CM

## 2016-09-22 DIAGNOSIS — J392 Other diseases of pharynx: Secondary | ICD-10-CM | POA: Diagnosis not present

## 2016-09-22 DIAGNOSIS — I10 Essential (primary) hypertension: Secondary | ICD-10-CM

## 2016-09-22 DIAGNOSIS — J029 Acute pharyngitis, unspecified: Secondary | ICD-10-CM | POA: Diagnosis not present

## 2016-09-22 MED ORDER — AZITHROMYCIN 250 MG PO TABS: ORAL_TABLET | ORAL | 0 refills | Status: DC

## 2016-09-22 NOTE — Patient Instructions (Addendum)
     IF you received an x-ray today, you will receive an invoice from [facility]. Please contact [facility] at [phone] with questions or concerns regarding your invoice.   IF you received labwork today, you will receive an invoice from [facility]. Please contact [facility] at [phone] with questions or concerns regarding your invoice.   Our billing staff will not be able to assist you with questions regarding bills from these companies.  You will be contacted with the lab results as soon as they are available. The fastest way to get your results is to activate your My Chart account. Instructions are located on the last page of this paperwork. If you have not heard from us regarding the results in 2 weeks, please contact this office.     Pharyngitis Pharyngitis is a sore throat (pharynx). There is redness, pain, and swelling of your throat. Follow these instructions at home:  Drink enough fluids to keep your pee (urine) clear or pale yellow.  Only take medicine as told by your doctor.  You may get sick again if you do not take medicine as told. Finish your medicines, even if you start to feel better.  Do not take aspirin.  Rest.  Rinse your mouth (gargle) with salt water ( tsp of salt per 1 qt of water) every 1-2 hours. This will help the pain.  If you are not at risk for choking, you can suck on hard candy or sore throat lozenges. Contact a doctor if:  You have large, tender lumps on your neck.  You have a rash.  You cough up green, yellow-brown, or bloody spit. Get help right away if:  You have a stiff neck.  You drool or cannot swallow liquids.  You throw up (vomit) or are not able to keep medicine or liquids down.  You have very bad pain that does not go away with medicine.  You have problems breathing (not from a stuffy nose). This information is not intended to replace advice given to you by your health care provider. Make sure you  discuss any questions you have with your health care provider. Document Released: 02/04/2008 Document Revised: 01/24/2016 Document Reviewed: 04/25/2013 Elsevier Interactive Patient Education  2017 Elsevier Inc.  

## 2016-09-22 NOTE — Progress Notes (Signed)
Gregg Smith 71 y.o.   Chief Complaint  Patient presents with  . Sore Throat    X 1 month    HISTORY OF PRESENT ILLNESS: This is a 71 y.o. male complaining of painful throat x 1 month.  HPI   Prior to Admission medications   Medication Sig Start Date End Date Taking? Authorizing Provider  docusate sodium (COLACE) 100 MG capsule Take 1 capsule (100 mg total) by mouth 2 (two) times daily. 08/12/16  Yes Matthew Babish, PA-C  lansoprazole (PREVACID) 30 MG capsule TAKE ONE CAPSULE BY MOUTH EVERY DAY. Patient taking differently: Take 30 mg by mouth daily at 3 pm. TAKE ONE CAPSULE BY MOUTH EVERY DAY at noon 06/03/16  Yes Irene Shipper, MD  lisinopril-hydrochlorothiazide (PRINZIDE,ZESTORETIC) 10-12.5 MG tablet Take 1 tablet by mouth daily. Patient taking differently: Take 1 tablet by mouth at bedtime.  07/11/16  Yes Tereasa Coop, PA-C  rosuvastatin (CRESTOR) 10 MG tablet Take 1 tablet (10 mg total) by mouth daily. Patient taking differently: Take 10 mg by mouth at bedtime.  07/11/16  Yes Tereasa Coop, PA-C  sucralfate (CARAFATE) 1 g tablet TAKE 1 TABLET BY MOUTH 1 HOUR BEFORE MEALS AND AT BEDTIME Patient taking differently: Take 1 g by mouth 4 (four) times daily -  before meals and at bedtime. TAKE 1 TABLET BY MOUTH 1 HOUR BEFORE MEALS AND AT BEDTIME 07/11/16  Yes Tereasa Coop, PA-C  tamsulosin (FLOMAX) 0.4 MG CAPS capsule TAKE ONE CAPSULE BY MOUTH EVERY DAY Patient taking differently: TAKE ONE CAPSULE (0.4 MG) BY MOUTH EVERY DAY IN THE EVENING 04/30/16  Yes Tereasa Coop, PA-C    Allergies  Allergen Reactions  . Penicillins Hives and Rash    Has patient had a PCN reaction causing immediate rash, facial/tongue/throat swelling, SOB or lightheadedness with hypotension:unsure Has patient had a PCN reaction causing severe rash involving mucus membranes or skin necrosis:unsure Has patient had a PCN reaction that required hospitalization:No Has patient had a PCN reaction  occurring within the last 10 years:No If all of the above answers are "NO", then may proceed with Cephalosporin use. Childhood reaction     Patient Active Problem List   Diagnosis Date Noted  . 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 OSA (obstructive sleep apnea) 03/22/2014  . UARS (upper airway resistance syndrome) 03/22/2014  . Snoring disorder 05/09/2013  . Unspecified essential hypertension 02/11/2013  . Other and unspecified hyperlipidemia 02/11/2013  . Arthritis 02/11/2013    Past Medical History:  Diagnosis Date  . Allergy    seasonal   . Arthritis   . Barrett's esophagus   . Chicken pox   . Colon polyps    hyperplastic  . Diverticulosis   . GERD (gastroesophageal reflux disease)   . Hemorrhoids   . Hyperlipidemia   . Hypertension   . Leg cramps   . Measles   . Mumps   . Pneumonia   . Rhinitis   . Sleep apnea    dental device  . Snoring disorder 05/09/2013    Past Surgical History:  Procedure Laterality Date  . APPENDECTOMY  1957  . COLONOSCOPY    . TONSILLECTOMY    . [place]  . 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 UPPER GASTROINTESTINAL ENDOSCOPY      Social History  Social History  . Marital status: Married    Spouse name: [name]  . Number of children: 1  . Years of education: PHD   Occupational History  .      Professor,[employer]  .  [place]   Social History Main Topics  . Smoking status: Former Smoker    Types: Cigarettes    Quit date: 01/19/2007  . Smokeless tobacco: Never Used  . Alcohol use 1.8 oz/week    3 Cans of beer per week     Comment: 2 beers weekly  . Drug use: No  . Sexual activity: Yes    Birth control/ protection: None   Other Topics Concern  . Not on file   Social History Narrative   Patient is married  Charity fundraiser) and lives at home with his wife.   Patient is working full-time.   Patient has a Ph.D   Patient has one adult child.   Patient is right-handed.   Patient drinks two cups of tea daily.    Family History  Problem Relation Age of Onset  . Alzheimer's disease Mother   . Cancer Father   . Aortic aneurysm Son   . Heart disease Son   . Cancer Maternal Grandmother   . Heart disease Maternal Grandfather   . Stomach cancer Paternal Grandfather   . Colon cancer Neg Hx   . Colon polyps Neg Hx   . Esophageal cancer Neg Hx   . Rectal cancer Neg Hx      Review of Systems  Constitutional: Negative for chills, fever and weight loss.  HENT: Positive for sore throat. Negative for congestion, nosebleeds and sinus pain.   Eyes: Negative for discharge and redness.  Respiratory: Positive for cough. Negative for hemoptysis, sputum production, shortness of breath and stridor.   Cardiovascular: Negative for chest pain and palpitations.  Gastrointestinal: Negative for abdominal pain, diarrhea, nausea and vomiting.  Genitourinary: Negative for dysuria and hematuria.  Musculoskeletal: Positive for joint pain (recent left knee replacement).  Skin: Negative for rash.  Neurological: Negative for dizziness and headaches.  Endo/Heme/Allergies: Negative.   All other systems reviewed and are negative.  Vitals:   09/22/16 1336  BP: 122/80  Pulse: 76  Resp: 16  Temp: 98.2 F (36.8 C)     Physical Exam  Constitutional: He is oriented to person, place, and time. He appears well-developed and well-nourished.  HENT:  Head: Normocephalic and atraumatic.  Nose: Nose normal.  Mouth/Throat: Posterior oropharyngeal erythema present. No oropharyngeal exudate, posterior oropharyngeal edema or tonsillar abscesses.  Eyes: Conjunctivae and EOM are normal. Pupils are equal, round, and reactive to light.  Neck: Normal range of motion. Neck supple.  Cardiovascular: Normal rate, regular rhythm and normal  heart sounds.   Pulmonary/Chest: Effort normal and breath sounds normal.  Abdominal: Soft. There is no tenderness.  Musculoskeletal: Normal range of motion.  Neurological: He is alert and oriented to person, place, and time.  Skin: Skin is warm and dry. Capillary refill takes less than 2 seconds.  Psychiatric: He has a normal mood and affect. His behavior is normal.  Vitals reviewed.    ASSESSMENT & PLAN: Jagar was seen today for sore throat.  Diagnoses and all orders for this visit:  Acute pharyngitis, unspecified etiology -     Ambulatory referral to ENT  Gastroesophageal reflux disease, esophagitis presence not specified  Essential hypertension  Pharyngeal pain  Other orders -     azithromycin (ZITHROMAX) 250 MG  tablet; Sig as indicated    Given chronicity of symptoms, age of patient, and past smoking Hx, will request ENT evaluation for possible laryngoscopic examination. Symptoms possibly related to GERD. Patient Instructions       IF you received an x-ray today, you will receive an invoice from [facility]. Please contact [facility] at [phone] with questions or concerns regarding your invoice.   IF you received labwork today, you will receive an invoice from [facility]. Please contact [facility] at [phone] with questions or concerns regarding your invoice.   Our billing staff will not be able to assist you with questions regarding bills from these companies.  You will be contacted with the lab results as soon as they are available. The fastest way to get your results is to activate your My Chart account. Instructions are located on the last page of this paperwork. If you have not heard from [place] regarding the results in 2 weeks, please contact this office.      Pharyngitis Pharyngitis is a sore throat (pharynx). There is redness, pain, and swelling of your throat. Follow these instructions at home:  Drink enough fluids to keep your pee  (urine) clear or pale yellow.  Only take medicine as told by your doctor.  You may get sick again if you do not take medicine as told. Finish your medicines, even if you start to feel better.  Do not take aspirin.  Rest.  Rinse your mouth (gargle) with salt water ( tsp of salt per 1 qt of water) every 1-2 hours. This will help the pain.  If you are not at risk for choking, you can suck on hard candy or sore throat lozenges. Contact a doctor if:  You have large, tender lumps on your neck.  You have a rash.  You cough up green, yellow-brown, or bloody spit. Get help right away if:  You have a stiff neck.  You drool or cannot swallow liquids.  You throw up (vomit) or are not able to keep medicine or liquids down.  You have very bad pain that does not go away with medicine.  You have problems breathing (not from a stuffy nose). This information is not intended to replace advice given to you by your health care provider. Make sure you discuss any questions you have with your health care provider. Document Released: 02/04/2008 Document Revised: 01/24/2016 Document Reviewed: 04/25/2013 Elsevier Interactive Patient Education  2017 Elsevier Inc.      Agustina Caroli, MD [facility]

## 2016-09-23 ENCOUNTER — Other Ambulatory Visit: Payer: Self-pay | Admitting: Physician Assistant

## 2016-10-06 ENCOUNTER — Ambulatory Visit: Payer: BC Managed Care – PPO | Admitting: Physician Assistant

## 2016-10-13 ENCOUNTER — Telehealth: Payer: Self-pay

## 2016-10-13 MED ORDER — SUCRALFATE 1 G PO TABS: ORAL_TABLET | ORAL | 1 refills | Status: DC

## 2016-10-13 NOTE — Telephone Encounter (Signed)
Pt is needing a refill on sucralfate   Best number [phone]

## 2016-10-15 ENCOUNTER — Other Ambulatory Visit: Payer: Self-pay | Admitting: Physician Assistant

## 2016-11-22 ENCOUNTER — Other Ambulatory Visit: Payer: Self-pay | Admitting: Physician Assistant

## 2016-12-15 ENCOUNTER — Other Ambulatory Visit: Payer: Self-pay | Admitting: Physician Assistant

## 2016-12-20 ENCOUNTER — Other Ambulatory Visit: Payer: Self-pay | Admitting: Physician Assistant

## 2016-12-26 ENCOUNTER — Other Ambulatory Visit: Payer: Self-pay | Admitting: Physician Assistant

## 2017-01-25 ENCOUNTER — Other Ambulatory Visit: Payer: Self-pay | Admitting: Physician Assistant

## 2017-02-16 ENCOUNTER — Other Ambulatory Visit: Payer: Self-pay | Admitting: Emergency Medicine

## 2017-02-23 ENCOUNTER — Other Ambulatory Visit: Payer: Self-pay | Admitting: Emergency Medicine

## 2017-03-05 ENCOUNTER — Other Ambulatory Visit: Payer: Self-pay | Admitting: Physician Assistant

## 2017-03-05 DIAGNOSIS — K2270 Barrett's esophagus without dysplasia: Secondary | ICD-10-CM

## 2017-03-05 MED ORDER — LANSOPRAZOLE 30 MG PO CPDR: DELAYED_RELEASE_CAPSULE | ORAL | 11 refills | Status: DC

## 2017-03-05 NOTE — Telephone Encounter (Signed)
PT WIFE STATES THAT THEY ARE GOING OUT OF TOWN TOMORROW TO [place] THEN TO [facility] Saturday AND NEED A REFILL ON FLOMAX AND PREVACID

## 2017-03-05 NOTE — Telephone Encounter (Signed)
Medication refilled and e-scribed to pharmacy.  Pt made aware

## 2017-03-30 ENCOUNTER — Other Ambulatory Visit: Payer: Self-pay | Admitting: Physician Assistant

## 2017-04-12 ENCOUNTER — Other Ambulatory Visit: Payer: Self-pay | Admitting: Physician Assistant

## 2017-04-13 ENCOUNTER — Encounter: Payer: Self-pay | Admitting: Physician Assistant

## 2017-04-13 ENCOUNTER — Ambulatory Visit (INDEPENDENT_AMBULATORY_CARE_PROVIDER_SITE_OTHER): Payer: BC Managed Care – PPO | Admitting: Physician Assistant

## 2017-04-13 VITALS — BP 114/67 | HR 72 | Temp 98.1°F | Resp 18 | Ht 72.0 in | Wt 275.0 lb

## 2017-04-13 DIAGNOSIS — J069 Acute upper respiratory infection, unspecified: Secondary | ICD-10-CM

## 2017-04-13 MED ORDER — AZITHROMYCIN 250 MG PO TABS: ORAL_TABLET | ORAL | 0 refills | Status: DC

## 2017-04-13 NOTE — Progress Notes (Signed)
04/15/2017 8:14 AM   DOB: February 09, 1946 / MRN: [id number]  SUBJECTIVE:  Gregg Smith is a 71 y.o. male presenting for eye itchyness, nasal discharge, mild non productive cough and fatigue. No fever, chills, nausea, ear pain or pressure.  Does have a mild sore throat.  No leg swelling.   He is allergic to penicillins.   He  has a past medical history of Allergy; Arthritis; Barrett's esophagus; Chicken pox; Colon polyps; Diverticulosis; GERD (gastroesophageal reflux disease); Hemorrhoids; Hyperlipidemia; Hypertension; Leg cramps; Measles; Mumps; Pneumonia; Rhinitis; Sleep apnea; and Snoring disorder (05/09/2013).    He  reports that he quit smoking about 10 years ago. His smoking use included Cigarettes. He has never used smokeless tobacco. He reports that he drinks about 1.8 oz of alcohol per week . He reports that he does not use drugs. He  reports that he currently engages in sexual activity. He reports using the following method of birth control/protection: None. The patient  has a past surgical history that includes Appendectomy (1517); Tonsillectomy and adenoidectomy (1954); Colonoscopy; Upper gastrointestinal endoscopy; Tonsillectomy; and Total knee arthroplasty (Left, 08/11/2016).  His family history includes Alzheimer's disease in his mother; Aortic aneurysm in his son; Cancer in his father and maternal grandmother; Heart disease in his maternal grandfather and son; Stomach cancer in his paternal grandfather.  Review of Systems  Constitutional: Negative for chills, diaphoresis and fever.  Respiratory: Negative for cough, hemoptysis, sputum production, shortness of breath and wheezing.   Cardiovascular: Negative for chest pain, orthopnea and leg swelling.  Gastrointestinal: Negative for nausea.  Skin: Negative for rash.  Neurological: Negative for dizziness.    The problem list and medications were reviewed and updated by myself where necessary and exist elsewhere in the encounter.     OBJECTIVE:  BP 114/67 (BP Location: Right Arm, Patient Position: Sitting, Cuff Size: Large)   Pulse 72   Temp 98.1 F (36.7 C) (Oral)   Resp 18   Ht 6' (1.829 m)   Wt 275 lb (124.7 kg)   SpO2 96%   BMI 37.30 kg/m   Physical Exam  Constitutional: He appears well-developed. He is active and cooperative.  Non-toxic appearance.  Cardiovascular: Normal rate, regular rhythm, S1 normal, S2 normal, normal heart sounds, intact distal pulses and normal pulses.  Exam reveals no gallop and no friction rub.   No murmur heard. Pulmonary/Chest: Effort normal. No stridor. No tachypnea. No respiratory distress. He has no wheezes. He has no rales.  Abdominal: He exhibits no distension.  Musculoskeletal: He exhibits no edema.  Neurological: He is alert.  Skin: Skin is warm and dry. He is not diaphoretic. No pallor.  Vitals reviewed.   No results found for this or any previous visit (from the past 72 hour(s)).  No results found.  ASSESSMENT AND PLAN:  [name] was seen today for sinusitis.  Diagnoses and all orders for this visit:  Acute URI: Viral vs allergic vs. ABRS.  Doubt the latter.  Z-pack if and only if fever, chills, or illness no better at day 8 total.  Claritin for now.  -     azithromycin (ZITHROMAX) 250 MG tablet; Do not fill until 8 days of total illness or unless you are worsening. If you fill, take two tabs on day one and one daily there after.    The patient is advised to call or return to clinic if he does not see an improvement in symptoms, or to seek the care of the closest emergency department  if he worsens with the above plan.   Philis Fendt, MHS, PA-C [facility] 04/15/2017 8:14 AM

## 2017-04-13 NOTE — Patient Instructions (Addendum)
Claritin daily for now.  Hold off on antibiotics until day 8. Call me if you have questions.     IF you received an x-ray today, you will receive an invoice from [facility]. Please contact [facility] at [phone] with questions or concerns regarding your invoice.   IF you received labwork today, you will receive an invoice from [facility]. Please contact [facility] at [phone] with questions or concerns regarding your invoice.   Our billing staff will not be able to assist you with questions regarding bills from these companies.  You will be contacted with the lab results as soon as they are available. The fastest way to get your results is to activate your My Chart account. Instructions are located on the last page of this paperwork. If you have not heard from [place] regarding the results in 2 weeks, please contact this office.

## 2017-05-25 ENCOUNTER — Encounter: Payer: Self-pay | Admitting: Internal Medicine

## 2017-06-01 ENCOUNTER — Other Ambulatory Visit: Payer: Self-pay | Admitting: Physician Assistant

## 2017-06-11 ENCOUNTER — Encounter: Payer: Self-pay | Admitting: Physician Assistant

## 2017-06-11 ENCOUNTER — Ambulatory Visit (INDEPENDENT_AMBULATORY_CARE_PROVIDER_SITE_OTHER): Payer: BC Managed Care – PPO | Admitting: Physician Assistant

## 2017-06-11 VITALS — BP 118/70 | HR 74 | Resp 16 | Ht 72.0 in | Wt 285.6 lb

## 2017-06-11 DIAGNOSIS — Z122 Encounter for screening for malignant neoplasm of respiratory organs: Secondary | ICD-10-CM

## 2017-06-11 DIAGNOSIS — Z13228 Encounter for screening for other metabolic disorders: Secondary | ICD-10-CM | POA: Diagnosis not present

## 2017-06-11 DIAGNOSIS — Z87891 Personal history of nicotine dependence: Secondary | ICD-10-CM | POA: Diagnosis not present

## 2017-06-11 DIAGNOSIS — Z1159 Encounter for screening for other viral diseases: Secondary | ICD-10-CM

## 2017-06-11 DIAGNOSIS — Z23 Encounter for immunization: Secondary | ICD-10-CM

## 2017-06-11 DIAGNOSIS — Z130 Encounter for screening for diseases of the blood and blood-forming organs and certain disorders involving the immune mechanism: Secondary | ICD-10-CM

## 2017-06-11 DIAGNOSIS — Z136 Encounter for screening for cardiovascular disorders: Secondary | ICD-10-CM | POA: Diagnosis not present

## 2017-06-11 DIAGNOSIS — Z0000 Encounter for general adult medical examination without abnormal findings: Secondary | ICD-10-CM | POA: Diagnosis not present

## 2017-06-11 DIAGNOSIS — Z1329 Encounter for screening for other suspected endocrine disorder: Secondary | ICD-10-CM

## 2017-06-11 MED ORDER — ZOSTER VAC RECOMB ADJUVANTED 50 MCG/0.5ML IM SUSR: 0.5000 mL | Freq: Once | INTRAMUSCULAR | 1 refills | Status: AC

## 2017-06-11 NOTE — Progress Notes (Signed)
06/11/2017 9:14 AM   DOB: 05-16-1946 / MRN: [id number]  SUBJECTIVE:  Gregg Smith is a 71 y.o. male presenting for annual exam.  Colonoscopy current. Wears a mouth piece for sleep apnea and this works well for him. Likes to garden most days of the week and tells me that this totals about 5 hours a week but maybe 20.  Smoked for greater that 40- years and quit about ten years ago. No abdomen CT exist.  No previous chest CT exist.   The natural history of prostate cancer and ongoing controversy regarding screening and potential treatment outcomes of prostate cancer has been discussed with the patient. The meaning of a false positive PSA and a false negative PSA has been discussed. He indicates understanding of the limitations of this screening test and wishes not to proceed with screening PSA testing.  Immunization History  Administered Date(s) Administered  . Influenza, High Dose Seasonal PF 05/15/2017  . Influenza-Unspecified 06/01/2014, 07/07/2015, 06/02/2016  . Pneumococcal Conjugate-13 10/06/2014  . Pneumococcal Polysaccharide-23 06/18/2007  . Tdap 04/14/2012, 08/14/2012  . Zoster 08/17/2008     He is allergic to penicillins.   He  has a past medical history of Allergy; Arthritis; Barrett's esophagus; Chicken pox; Colon polyps; Diverticulosis; GERD (gastroesophageal reflux disease); Hemorrhoids; Hyperlipidemia; Hypertension; Leg cramps; Measles; Mumps; Pneumonia; Rhinitis; Sleep apnea; and Snoring disorder (05/09/2013).    He  reports that he quit smoking about 10 years ago. His smoking use included Cigarettes. He has never used smokeless tobacco. He reports that he drinks about 1.8 oz of alcohol per week . He reports that he does not use drugs. He  reports that he currently engages in sexual activity. He reports using the following method of birth control/protection: None. The patient  has a past surgical history that includes Appendectomy (6546); Tonsillectomy and adenoidectomy  (1954); Colonoscopy; Upper gastrointestinal endoscopy; Tonsillectomy; and Total knee arthroplasty (Left, 08/11/2016).  His family history includes Alzheimer's disease in his mother; Aortic aneurysm in his son; Cancer in his father and maternal grandmother; Heart disease in his maternal grandfather and son; Stomach cancer in his paternal grandfather.  Review of Systems  Constitutional: Negative for chills, diaphoresis and fever.  Eyes: Negative.   Respiratory: Negative for cough, hemoptysis, sputum production, shortness of breath and wheezing.   Cardiovascular: Negative for chest pain, orthopnea and leg swelling.  Gastrointestinal: Negative for abdominal pain, blood in stool, constipation, diarrhea, heartburn, melena, nausea and vomiting.  Genitourinary: Negative for dysuria, flank pain, frequency, hematuria and urgency.  Skin: Negative for rash.  Neurological: Negative for dizziness, sensory change, speech change, focal weakness and headaches.    The problem list and medications were reviewed and updated by myself where necessary and exist elsewhere in the encounter.   OBJECTIVE:  BP 118/70 (BP Location: Left Arm, Patient Position: Sitting, Cuff Size: Large)   Pulse 74   Resp 16   Ht 6' (1.829 m)   Wt 285 lb 9.6 oz (129.5 kg)   SpO2 95%   BMI 38.73 kg/m   Physical Exam  Constitutional: He is oriented to person, place, and time. He appears well-developed. He is active and cooperative.  Non-toxic appearance.  Eyes: Pupils are equal, round, and reactive to light. EOM are normal.  Cardiovascular: Normal rate, regular rhythm, S1 normal, S2 normal, normal heart sounds, intact distal pulses and normal pulses.  Exam reveals no gallop and no friction rub.   No murmur heard. Pulmonary/Chest: Effort normal. No stridor. No tachypnea. No respiratory  distress. He has no wheezes. He has no rales.  Abdominal: He exhibits no distension.  Musculoskeletal: He exhibits no edema.  Neurological: He is  alert and oriented to person, place, and time. He has normal strength and normal reflexes. He is not disoriented. No cranial nerve deficit or sensory deficit. He exhibits normal muscle tone. Coordination and gait normal.  Skin: Skin is warm and dry. He is not diaphoretic. No pallor.  Psychiatric: His behavior is normal.  Vitals reviewed.   Lab Results  Component Value Date   PSA 1.97 09/19/2014   PSA 1.80 04/14/2012   Lab Results  Component Value Date   WBC 16.7 (H) 08/12/2016   HGB 12.2 (L) 08/12/2016   HCT 36.9 (L) 08/12/2016   MCV 86.0 08/12/2016   PLT 205 08/12/2016    Lab Results  Component Value Date   CREATININE 0.94 08/12/2016   BUN 27 (H) 08/12/2016   NA 138 08/12/2016   K 4.1 08/12/2016   CL 108 08/12/2016   CO2 23 08/12/2016    Lab Results  Component Value Date   ALT 20 09/05/2015   AST 28 09/05/2015   ALKPHOS 67 09/05/2015   BILITOT 0.5 09/05/2015    Lab Results  Component Value Date   TSH 1.573 09/05/2015    Lab Results  Component Value Date   HGBA1C 5.5 09/05/2015    Lab Results  Component Value Date   CHOL 153 09/05/2015   HDL 39 (L) 09/05/2015   LDLCALC 87 09/05/2015   LDLDIRECT 91 02/17/2014   TRIG 136 09/05/2015   CHOLHDL 3.9 09/05/2015    ASSESSMENT AND PLAN:  Gregg Smith was seen today for annual exam.  Diagnoses and all orders for this visit:  Annual physical exam  Screening for endocrine, metabolic and immunity disorder -     CBC -     Lipid panel -     TSH -     Hemoglobin A1c  Need for hepatitis C screening test -     Hepatitis C antibody  Stopped smoking with greater than 40 pack year history Comments: Stopped about 10 years ago.  Per USPSTF guidlines needs both an abdominal ultrasound and a low dose chest CT.  Referrals out.   Encounter for screening for lung cancer -     CT Chest Wo Contrast; Future  Screening for AAA (abdominal aortic aneurysm) -     US ABDOMINAL AORTA SCREENING AAA; Future  Need for  vaccination for zoster -     Cancel: Varicella-zoster vaccine IM (Shingrix) -     Zoster Vac Recomb Adjuvanted (Gregg Smith) injection; Inject 0.5 mLs into the muscle once. Booster (refill) due 6 months after initial injection.    The patient is advised to call or return to clinic if he does not see an improvement in symptoms, or to seek the care of the closest emergency department if he worsens with the above plan.   Philis Fendt, MHS, PA-C [facility] 06/11/2017 9:14 AM

## 2017-06-12 LAB — CBC
HEMATOCRIT: 44.6 % (ref 37.5–51.0)
Hemoglobin: 14.7 g/dL (ref 13.0–17.7)
MCH: 28.1 pg (ref 26.6–33.0)
MCHC: 33 g/dL (ref 31.5–35.7)
MCV: 85 fL (ref 79–97)
Platelets: 218 10*3/uL (ref 150–379)
RBC: 5.24 x10E6/uL (ref 4.14–5.80)
RDW: 15.1 % (ref 12.3–15.4)
WBC: 6.4 10*3/uL (ref 3.4–10.8)

## 2017-06-12 LAB — LIPID PANEL
Chol/HDL Ratio: 3.7 ratio (ref 0.0–5.0)
Cholesterol, Total: 137 mg/dL (ref 100–199)
HDL: 37 mg/dL — AB (ref >39)
LDL Calculated: 78 mg/dL (ref 0–99)
TRIGLYCERIDES: 110 mg/dL (ref 0–149)
VLDL CHOLESTEROL CAL: 22 mg/dL (ref 5–40)

## 2017-06-12 LAB — HEMOGLOBIN A1C
Est. average glucose Bld gHb Est-mCnc: 108 mg/dL
Hgb A1c MFr Bld: 5.4 % (ref 4.8–5.6)

## 2017-06-12 LAB — HEPATITIS C ANTIBODY

## 2017-06-12 LAB — TSH: TSH: 1.75 u[IU]/mL (ref 0.450–4.500)

## 2017-06-15 ENCOUNTER — Telehealth: Payer: Self-pay | Admitting: Physician Assistant

## 2017-06-15 NOTE — Telephone Encounter (Signed)
Initiated prior auth for CT chest w/o contrast but this order was not approved. AIM suggests ordering a low dose CT Chest instead for pt safety. I will be happy to start prior auth for this if we want to order instead. Please advise. AIM can be contacted at [phone]. Thanks!

## 2017-06-17 ENCOUNTER — Telehealth: Payer: Self-pay | Admitting: Physician Assistant

## 2017-06-17 NOTE — Telephone Encounter (Signed)
Call placed to patient regarding medication request, no answer on phone. Left message with call back number to office./ S.Breeonna Mone,CMA

## 2017-06-17 NOTE — Telephone Encounter (Signed)
Patient is having dental work done on 06/18/17 at 10am and he was told he has to have an antibiotic to have this work done---I told patient's wife he would have to come in to be seen to get this but she states that he can not come in today or tomorrow but he needs this medication. She was very rude on the phone and was getting very upset with me because I told her that.  The call back number is [phone]

## 2017-06-18 ENCOUNTER — Other Ambulatory Visit: Payer: Self-pay | Admitting: Physician Assistant

## 2017-06-18 DIAGNOSIS — R918 Other nonspecific abnormal finding of lung field: Secondary | ICD-10-CM

## 2017-06-19 ENCOUNTER — Telehealth: Payer: Self-pay | Admitting: Physician Assistant

## 2017-06-19 NOTE — Telephone Encounter (Signed)
Shannon from [facility] would like the dx codes from lab work preformed on pt DOS 06/11/17. Thank you.

## 2017-07-05 ENCOUNTER — Other Ambulatory Visit: Payer: Self-pay | Admitting: Physician Assistant

## 2017-07-06 ENCOUNTER — Other Ambulatory Visit: Payer: Self-pay | Admitting: Physician Assistant

## 2017-07-08 ENCOUNTER — Other Ambulatory Visit: Payer: Self-pay | Admitting: Physician Assistant

## 2017-07-08 NOTE — Telephone Encounter (Signed)
Copied from [facility] #[id number]. Topic: Quick Communication - See Telephone Encounter >> Jul 08, 2017  5:19 PM Corie Chiquito, [place] wrote: CRM for notification. See Telephone encounter for:  Patient needs his Rosuvastatin refilled 07/08/17.

## 2017-07-11 ENCOUNTER — Other Ambulatory Visit: Payer: Self-pay | Admitting: Physician Assistant

## 2017-08-07 ENCOUNTER — Other Ambulatory Visit: Payer: Self-pay | Admitting: Physician Assistant

## 2017-08-09 ENCOUNTER — Encounter: Payer: Self-pay | Admitting: Physician Assistant

## 2017-08-13 ENCOUNTER — Other Ambulatory Visit: Payer: Self-pay | Admitting: Physician Assistant

## 2017-08-14 ENCOUNTER — Encounter: Payer: Self-pay | Admitting: Physician Assistant

## 2017-08-14 ENCOUNTER — Ambulatory Visit: Payer: BC Managed Care – PPO | Admitting: Physician Assistant

## 2017-08-14 ENCOUNTER — Other Ambulatory Visit: Payer: Self-pay

## 2017-08-14 VITALS — BP 118/62 | HR 72 | Temp 98.1°F | Resp 16 | Ht 72.0 in | Wt 288.0 lb

## 2017-08-14 DIAGNOSIS — K2270 Barrett's esophagus without dysplasia: Secondary | ICD-10-CM

## 2017-08-14 DIAGNOSIS — E785 Hyperlipidemia, unspecified: Secondary | ICD-10-CM

## 2017-08-14 DIAGNOSIS — I10 Essential (primary) hypertension: Secondary | ICD-10-CM

## 2017-08-14 DIAGNOSIS — K219 Gastro-esophageal reflux disease without esophagitis: Secondary | ICD-10-CM | POA: Insufficient documentation

## 2017-08-14 DIAGNOSIS — Z87891 Personal history of nicotine dependence: Secondary | ICD-10-CM

## 2017-08-14 MED ORDER — LANSOPRAZOLE 30 MG PO CPDR: DELAYED_RELEASE_CAPSULE | ORAL | 3 refills | Status: DC

## 2017-08-14 MED ORDER — ROSUVASTATIN CALCIUM 10 MG PO TABS: 10.0000 mg | ORAL_TABLET | Freq: Every day | ORAL | 3 refills | Status: DC

## 2017-08-14 MED ORDER — LISINOPRIL-HYDROCHLOROTHIAZIDE 10-12.5 MG PO TABS: 1.0000 | ORAL_TABLET | Freq: Every day | ORAL | 3 refills | Status: DC

## 2017-08-14 MED ORDER — TAMSULOSIN HCL 0.4 MG PO CAPS: 0.4000 mg | ORAL_CAPSULE | Freq: Every day | ORAL | 3 refills | Status: DC

## 2017-08-14 MED ORDER — SUCRALFATE 1 G PO TABS: ORAL_TABLET | ORAL | 3 refills | Status: DC

## 2017-08-14 NOTE — Patient Instructions (Signed)
     IF you received an x-ray today, you will receive an invoice from [facility]. Please contact [facility] at [phone] with questions or concerns regarding your invoice.   IF you received labwork today, you will receive an invoice from [facility]. Please contact [facility] at [phone] with questions or concerns regarding your invoice.   Our billing staff will not be able to assist you with questions regarding bills from these companies.  You will be contacted with the lab results as soon as they are available. The fastest way to get your results is to activate your My Chart account. Instructions are located on the last page of this paperwork. If you have not heard from us regarding the results in 2 weeks, please contact this office.     

## 2017-08-14 NOTE — Progress Notes (Addendum)
02/17/2018 1:59 PM   DOB: 09-Dec-1945 / MRN: [id number]  SUBJECTIVE:  Gregg Smith is a 71 y.o. male presenting for med refills. He fills well. Has a history of gerd with barrett's esophagus and takes prevacid at lunch time but sometimes forgets. Most recent endoscopy was in 2017 with Dr. Henrene Smith and this was negative for Barrett's.   Takes sucralfate bid to tid.  Has a history of well controlled HTN on minimal medication. Has a history of OSA and using a prescription mouthpiece to hold his airway open.  Uses this about 2/3 of the night but will try to use it more consistently in the future.  Last lipid panel with a mildly low LDL.  He takes Crestor without fail.   He is allergic to penicillins.   He  has a past medical history of Allergy, Arthritis, Barrett's esophagus, Cataract, Chicken pox, Colon polyps, Diverticulosis, GERD (gastroesophageal reflux disease), Hemorrhoids, Hyperlipidemia, Hypertension, Leg cramps, Measles, Mumps, Pneumonia, Rhinitis, Sleep apnea, and Snoring disorder (05/09/2013).    He  reports that he quit smoking about 11 years ago. His smoking use included cigarettes. He has a 40.00 pack-year smoking history. He has never used smokeless tobacco. He reports that he drinks about 1.8 oz of alcohol per week. He reports that he does not use drugs. He  reports that he currently engages in sexual activity. He reports using the following method of birth control/protection: None. The patient  has a past surgical history that includes Appendectomy (3016); Tonsillectomy and adenoidectomy (1954); Colonoscopy; Upper gastrointestinal endoscopy; Tonsillectomy; and Total knee arthroplasty (Left, 08/11/2016).  His family history includes Alzheimer's disease in his mother; Aortic aneurysm in his son; Cancer in his father and maternal grandmother; Heart disease in his maternal grandfather and son; Stomach cancer in his father and paternal grandfather.  Review of Systems  Constitutional:  Negative for chills, diaphoresis and fever.  Eyes: Negative.   Respiratory: Negative for cough, hemoptysis, sputum production, shortness of breath and wheezing.   Cardiovascular: Negative for chest pain, orthopnea and leg swelling.  Gastrointestinal: Negative for nausea.  Skin: Negative for rash.  Neurological: Negative for dizziness, sensory change, speech change, focal weakness and headaches.    The problem list and medications were reviewed and updated by myself where necessary and exist elsewhere in the encounter.   OBJECTIVE:  BP 118/62 (BP Location: Left Arm, Patient Position: Sitting, Cuff Size: Large)   Pulse 72   Temp 98.1 F (36.7 C) (Oral)   Resp 16   Ht 6' (1.829 m)   Wt 288 lb (130.6 kg)   SpO2 95%   BMI 39.06 kg/m   Lab Results  Component Value Date   WBC 6.0 08/14/2017   HGB 15.3 08/14/2017   HCT 44.3 08/14/2017   MCV 85 08/14/2017   PLT 207 08/14/2017    Lab Results  Component Value Date   CREATININE 0.94 08/12/2016   BUN 27 (H) 08/12/2016   NA 138 08/12/2016   K 4.1 08/12/2016   CL 108 08/12/2016   CO2 23 08/12/2016    Lab Results  Component Value Date   ALT 20 09/05/2015   AST 28 09/05/2015   ALKPHOS 67 09/05/2015   BILITOT 0.5 09/05/2015    Lab Results  Component Value Date   TSH 1.500 08/14/2017    Lab Results  Component Value Date   HGBA1C 5.5 08/14/2017    Lab Results  Component Value Date   CHOL 147 08/14/2017   HDL 35 (  L) 08/14/2017   LDLCALC 87 08/14/2017   LDLDIRECT 91 02/17/2014   TRIG 126 08/14/2017   CHOLHDL 4.2 08/14/2017       Physical Exam  Constitutional: He is oriented to person, place, and time. He appears well-developed and well-nourished. No distress.  HENT:  Mouth/Throat: Oropharynx is clear and moist. No oropharyngeal exudate.  Cardiovascular: Normal rate, regular rhythm, normal heart sounds and intact distal pulses. Exam reveals no gallop and no friction rub.  No murmur heard. Pulmonary/Chest:  Effort normal and breath sounds normal. No respiratory distress. He has no wheezes. He has no rales. He exhibits no tenderness.  Musculoskeletal: Normal range of motion.  Neurological: He is alert and oriented to person, place, and time. He has normal reflexes.  Skin: Skin is warm and dry. He is not diaphoretic.    No results found for this or any previous visit (from the past 72 hour(s)).  No results found.  ASSESSMENT AND PLAN:  Gregg Smith was seen today for medication refill.  Diagnoses and all orders for this visit:  Essential hypertension -     lisinopril-hydrochlorothiazide (PRINZIDE,ZESTORETIC) 10-12.5 MG tablet; Take 1 tablet by mouth daily. -     CBC -     Lipid panel -     TSH -     Hemoglobin A1c -     Care order/instruction:  Barrett's esophagus without dysplasia  Gastroesophageal reflux disease, esophagitis presence not specified -     lansoprazole (PREVACID) 30 MG capsule; Take one tab 30 minutes before breakfast every morning. -     sucralfate (CARAFATE) 1 g tablet; Avoid morning dose, and take at lunch, dinner and bedtime.  Dyslipidemia -     rosuvastatin (CRESTOR) 10 MG tablet; Take 1 tablet (10 mg total) by mouth daily.  Stopped smoking with greater than 40 pack year history Comments: CT chest and abdominal aorta ordered.  Insurance denying CAT.  Clincal staff working to resolve the issue.   Other orders -     tamsulosin (FLOMAX) 0.4 MG CAPS capsule; Take 1 capsule (0.4 mg total) by mouth daily. Take at night.    The patient is advised to call or return to clinic if he does not see an improvement in symptoms, or to seek the care of the closest emergency department if he worsens with the above plan.   Gregg Smith, MHS, PA-C [facility] 02/17/2018 1:59 PM

## 2017-08-15 LAB — CBC
Hematocrit: 44.3 % (ref 37.5–51.0)
Hemoglobin: 15.3 g/dL (ref 13.0–17.7)
MCH: 29.3 pg (ref 26.6–33.0)
MCHC: 34.5 g/dL (ref 31.5–35.7)
MCV: 85 fL (ref 79–97)
PLATELETS: 207 10*3/uL (ref 150–379)
RBC: 5.22 x10E6/uL (ref 4.14–5.80)
RDW: 15.5 % — AB (ref 12.3–15.4)
WBC: 6 10*3/uL (ref 3.4–10.8)

## 2017-08-15 LAB — LIPID PANEL
CHOLESTEROL TOTAL: 147 mg/dL (ref 100–199)
Chol/HDL Ratio: 4.2 ratio (ref 0.0–5.0)
HDL: 35 mg/dL — AB (ref >39)
LDL Calculated: 87 mg/dL (ref 0–99)
TRIGLYCERIDES: 126 mg/dL (ref 0–149)
VLDL Cholesterol Cal: 25 mg/dL (ref 5–40)

## 2017-08-15 LAB — HEMOGLOBIN A1C
ESTIMATED AVERAGE GLUCOSE: 111 mg/dL
Hgb A1c MFr Bld: 5.5 % (ref 4.8–5.6)

## 2017-08-15 LAB — TSH: TSH: 1.5 u[IU]/mL (ref 0.450–4.500)

## 2017-09-24 ENCOUNTER — Ambulatory Visit (AMBULATORY_SURGERY_CENTER): Payer: Self-pay

## 2017-09-24 ENCOUNTER — Other Ambulatory Visit: Payer: Self-pay

## 2017-09-24 ENCOUNTER — Encounter: Payer: Self-pay | Admitting: Internal Medicine

## 2017-09-24 VITALS — Ht 72.0 in | Wt 297.0 lb

## 2017-09-24 DIAGNOSIS — K2270 Barrett's esophagus without dysplasia: Secondary | ICD-10-CM

## 2017-09-24 NOTE — Progress Notes (Signed)
Denies allergies to eggs or soy products. Denies complication of anesthesia or sedation. Denies use of weight loss medication. Denies use of O2.   Emmi instructions declined.  

## 2017-10-08 ENCOUNTER — Other Ambulatory Visit: Payer: Self-pay

## 2017-10-08 ENCOUNTER — Encounter: Payer: Self-pay | Admitting: Internal Medicine

## 2017-10-08 ENCOUNTER — Ambulatory Visit (AMBULATORY_SURGERY_CENTER): Payer: BC Managed Care – PPO | Admitting: Internal Medicine

## 2017-10-08 VITALS — BP 105/62 | HR 54 | Temp 98.0°F | Resp 22 | Ht 72.0 in | Wt 297.0 lb

## 2017-10-08 DIAGNOSIS — K2270 Barrett's esophagus without dysplasia: Secondary | ICD-10-CM | POA: Diagnosis not present

## 2017-10-08 DIAGNOSIS — K22710 Barrett's esophagus with low grade dysplasia: Secondary | ICD-10-CM | POA: Diagnosis not present

## 2017-10-08 DIAGNOSIS — K219 Gastro-esophageal reflux disease without esophagitis: Secondary | ICD-10-CM

## 2017-10-08 MED ORDER — SODIUM CHLORIDE 0.9 % IV SOLN: 500.0000 mL | Freq: Once | INTRAVENOUS | Status: DC

## 2017-10-08 MED ORDER — LANSOPRAZOLE 30 MG PO CPDR: 30.0000 mg | DELAYED_RELEASE_CAPSULE | Freq: Every day | ORAL | 3 refills | Status: DC

## 2017-10-08 NOTE — Progress Notes (Signed)
Called to room to assist during endoscopic procedure.  Patient ID and intended procedure confirmed with present staff. Received instructions for my participation in the procedure from the performing physician.  

## 2017-10-08 NOTE — Op Note (Signed)
Gregg Patient Name: Gregg Smith Procedure Date: 10/08/2017 10:38 AM MRN: [id number] Endoscopist: Docia Chuck. Henrene Pastor , MD Age: 72 Referring MD:  Date of Birth: 07/19/1946 Gender: Male Account #: [id number] Procedure:                Upper GI endoscopy, With biopsies Indications:              Follow-up of Barrett's esophagus. Index examination                            2016 with distal nodule biopsied read as Barrett's.                            Otherwise no columnar change. Follow-up exam2017                            with biopsies from the same area diagnosed as                            Barrett's with atypia. Patient is asymptomatic on                            PPI daily Medicines:                Monitored Anesthesia Care Procedure:                Pre-Anesthesia Assessment:                           - Prior to the procedure, a History and Physical                            was performed, and patient medications and                            allergies were reviewed. The patient's tolerance of                            previous anesthesia was also reviewed. The risks                            and benefits of the procedure and the sedation                            options and risks were discussed with the patient.                            All questions were answered, and informed consent                            was obtained. Prior Anticoagulants: The patient has                            taken no previous anticoagulant or antiplatelet  agents. ASA Grade Assessment: II - A patient with                            mild systemic disease. After reviewing the risks                            and benefits, the patient was deemed in                            satisfactory condition to undergo the procedure.                           After obtaining informed consent, the endoscope was                            passed under direct vision.  Throughout the                            procedure, the patient's blood pressure, pulse, and                            oxygen saturations were monitored continuously. The                            Endoscope was introduced through the mouth, and                            advanced to the second part of duodenum. The upper                            GI endoscopy was accomplished without difficulty.                            The patient tolerated the procedure well. Scope In: Scope Out: Findings:                 The Distal esophagus at the gastroesophageal                            junction revealed 1 small protrusion of columnar                            type mucosa evaluated under white light and NBI.                            See photograph. Prior area of interest.. Mucosa was                            biopsied with a cold forceps for histology.                           The esophagus was Otherwise normal.  The stomach was normal, Save small hiatal hernia.                           The examined duodenum was normal.                           The cardia and gastric fundus were normal on                            retroflexion. Complications:            No immediate complications. Estimated Blood Loss:     Estimated blood loss: none. Impression:               - Esophageal mucosal changes secondary to                            established ultra short-segment Barrett's disease.                            Biopsied.                           - Normal esophagus Otherwise.                           - Normal stomach.                           - Normal examined duodenum. Recommendation:           - Patient has a contact number available for                            emergencies. The signs and symptoms of potential                            delayed complications were discussed with the                            patient. Return to normal activities tomorrow.                             Written discharge instructions were provided to the                            patient.                           - Resume previous diet.                           - Continue present medications.                           - Await pathology results.                           - Repeat upper endoscopy Timing  to be determined                            after Biopsy results reviewed. Docia Chuck. Henrene Pastor, MD 10/08/2017 11:03:08 AM This report has been signed electronically.

## 2017-10-08 NOTE — Progress Notes (Signed)
Report to PACU, RN, vss, BBS= Clear.  

## 2017-10-08 NOTE — Patient Instructions (Signed)
    YOU HAD AN ENDOSCOPIC PROCEDURE TODAY AT [facility]:   Refer to the procedure report that was given to you for any specific questions about what was found during the examination.  If the procedure report does not answer your questions, please call your gastroenterologist to clarify.  If you requested that your care partner not be given the details of your procedure findings, then the procedure report has been included in a sealed envelope for you to review at your convenience later.  YOU SHOULD EXPECT: Some feelings of bloating in the abdomen. Passage of more gas than usual.  Walking can help get rid of the air that was put into your GI tract during the procedure and reduce the bloating. If you had a lower endoscopy (such as a colonoscopy or flexible sigmoidoscopy) you may notice spotting of blood in your stool or on the toilet paper. If you underwent a bowel prep for your procedure, you may not have a normal bowel movement for a few days.  Please Note:  You might notice some irritation and congestion in your nose or some drainage.  This is from the oxygen used during your procedure.  There is no need for concern and it should clear up in a day or so.  SYMPTOMS TO REPORT IMMEDIATELY:  Following upper endoscopy (EGD)  Vomiting of blood or coffee ground material  New chest pain or pain under the shoulder blades  Painful or persistently difficult swallowing  New shortness of breath  Fever of 100F or higher  Black, tarry-looking stools  For urgent or emergent issues, a gastroenterologist can be reached at any hour by calling [phone].   DIET:  We do recommend a small meal at first, but then you may proceed to your regular diet.  Drink plenty of fluids but you should avoid alcoholic beverages for 24 hours.  ACTIVITY:  You should plan to take it easy for the rest of today and you should NOT DRIVE or use heavy machinery until tomorrow (because of the sedation medicines  used during the test).    FOLLOW UP: Our staff will call the number listed on your records the next business day following your procedure to check on you and address any questions or concerns that you may have regarding the information given to you following your procedure. If we do not reach you, we will leave a message.  However, if you are feeling well and you are not experiencing any problems, there is no need to return our call.  We will assume that you have returned to your regular daily activities without incident.  If any biopsies were taken you will be contacted by phone or by letter within the next 1-3 weeks.  Please call us at [phone] if you have not heard about the biopsies in 3 weeks.    SIGNATURES/CONFIDENTIALITY: You and/or your care partner have signed paperwork which will be entered into your electronic medical record.  These signatures attest to the fact that that the information above on your After Visit Summary has been reviewed and is understood.  Full responsibility of the confidentiality of this discharge information lies with you and/or your care-partner.7)

## 2017-10-09 ENCOUNTER — Telehealth: Payer: Self-pay

## 2017-10-09 NOTE — Telephone Encounter (Signed)
  Follow up Call-  Call back number 10/08/2017 06/03/2016  Post procedure Call Back phone  # [phone]  Permission to leave phone message Yes Yes  Some recent data might be hidden     Patient questions:  Do you have a fever, pain , or abdominal swelling? No. Pain Score  0 *  Have you tolerated food without any problems? Yes.    Have you been able to return to your normal activities? Yes.    Do you have any questions about your discharge instructions: Diet   No. Medications  No. Follow up visit  No.  Do you have questions or concerns about your Care? No.  Actions: * If pain score is 4 or above: No action needed, pain <4.

## 2017-10-15 ENCOUNTER — Encounter: Payer: Self-pay | Admitting: Internal Medicine

## 2017-10-16 ENCOUNTER — Other Ambulatory Visit: Payer: Self-pay

## 2017-10-16 DIAGNOSIS — K219 Gastro-esophageal reflux disease without esophagitis: Secondary | ICD-10-CM

## 2017-10-16 DIAGNOSIS — K2270 Barrett's esophagus without dysplasia: Secondary | ICD-10-CM

## 2017-10-16 MED ORDER — LANSOPRAZOLE 30 MG PO CPDR: 30.0000 mg | DELAYED_RELEASE_CAPSULE | Freq: Two times a day (BID) | ORAL | 3 refills | Status: DC

## 2017-10-19 ENCOUNTER — Telehealth: Payer: Self-pay | Admitting: *Deleted

## 2017-10-19 NOTE — Telephone Encounter (Signed)
Spoke with pt to make sure he knew Lansoprazole had been increased to 30mg  twice a day and RX should be at [facility] as ordered by Dr Henrene Pastor. Pt was aware of increase but not that Rx should be at [facility] already.Pt understood increase and that medication should be available.

## 2018-02-09 ENCOUNTER — Telehealth: Payer: Self-pay

## 2018-02-09 NOTE — Telephone Encounter (Signed)
Copied from [place] [id number]. Topic: Inquiry >> Feb 09, 2018  1:05 PM Pricilla Handler wrote: Reason for CRM: Patient called stating that [facility] needs to verify that Philis Fendt still wants the patient to have CT Scans and Ultrasounds. Patient wants Legrand Como to contact [facility] to verify that he still wants these procedures.       Thank You!!!

## 2018-02-13 ENCOUNTER — Other Ambulatory Visit: Payer: Self-pay | Admitting: Physician Assistant

## 2018-02-13 DIAGNOSIS — Z87891 Personal history of nicotine dependence: Secondary | ICD-10-CM

## 2018-02-13 NOTE — Telephone Encounter (Signed)
He still needs to have these done.  I have order the test best I can.  We may just have to refer him to the NP who oversees these screenings. Attaching referral pool to ensure good communication. Philis Fendt, MS, PA-C 1:28 PM, 02/13/2018

## 2018-02-15 ENCOUNTER — Telehealth: Payer: Self-pay | Admitting: Physician Assistant

## 2018-02-15 NOTE — Telephone Encounter (Signed)
Insurance denied ct low does for pt. A peer to peer can be done to see if ct can get approved. ID# [id number] dob: 07/14/1946 aim number is [phone].

## 2018-02-17 ENCOUNTER — Encounter: Payer: Self-pay | Admitting: Physician Assistant

## 2018-02-17 NOTE — Telephone Encounter (Signed)
Yes you can add that, it may let us create a new case and possible approve it but it also may not.

## 2018-02-17 NOTE — Telephone Encounter (Signed)
Message posted by [facility] denied CT low dose lung ca screening.  Sent message to Peter Congo to see if ins will approve a CT chest w/o contrast with a diagnosis of family histor of cancer (father and son)?

## 2018-02-17 NOTE — Telephone Encounter (Signed)
See the chart on 12/14. He has a 40 pack year history quit in 2008.   I have updated his smoiking history to give an accurate depiction.  Hopefully this will go through.

## 2018-02-17 NOTE — Telephone Encounter (Signed)
Pt ct got approved auth# [id number] valid through 02/15/18-03/16/18.

## 2018-02-18 ENCOUNTER — Telehealth: Payer: Self-pay | Admitting: Physician Assistant

## 2018-02-18 NOTE — Telephone Encounter (Signed)
[employer] has approved the Low Dose CT Scan for lung cancer screening.  Auth: [id number]

## 2018-08-02 ENCOUNTER — Encounter: Payer: BC Managed Care – PPO | Admitting: Physician Assistant

## 2018-08-03 ENCOUNTER — Other Ambulatory Visit: Payer: Self-pay

## 2018-08-03 ENCOUNTER — Ambulatory Visit (INDEPENDENT_AMBULATORY_CARE_PROVIDER_SITE_OTHER): Payer: BC Managed Care – PPO | Admitting: Emergency Medicine

## 2018-08-03 ENCOUNTER — Encounter: Payer: Self-pay | Admitting: Emergency Medicine

## 2018-08-03 VITALS — BP 143/81 | HR 66 | Temp 98.0°F | Resp 16 | Ht 71.25 in | Wt 279.4 lb

## 2018-08-03 DIAGNOSIS — Z13228 Encounter for screening for other metabolic disorders: Secondary | ICD-10-CM

## 2018-08-03 DIAGNOSIS — Z0000 Encounter for general adult medical examination without abnormal findings: Secondary | ICD-10-CM

## 2018-08-03 DIAGNOSIS — Z125 Encounter for screening for malignant neoplasm of prostate: Secondary | ICD-10-CM | POA: Diagnosis not present

## 2018-08-03 DIAGNOSIS — Z13220 Encounter for screening for lipoid disorders: Secondary | ICD-10-CM

## 2018-08-03 DIAGNOSIS — Z130 Encounter for screening for diseases of the blood and blood-forming organs and certain disorders involving the immune mechanism: Secondary | ICD-10-CM

## 2018-08-03 DIAGNOSIS — Z23 Encounter for immunization: Secondary | ICD-10-CM | POA: Diagnosis not present

## 2018-08-03 DIAGNOSIS — Z1329 Encounter for screening for other suspected endocrine disorder: Secondary | ICD-10-CM

## 2018-08-03 NOTE — Patient Instructions (Addendum)

## 2018-08-03 NOTE — Progress Notes (Signed)
Gregg Smith 72 y.o.   Chief Complaint  Patient presents with  . Annual Exam  . Establish Care  . Depression    because wife passed unexpected 10 days ago   Depression screen [facility] 2/9 08/03/2018 08/14/2017 06/11/2017 04/13/2017 09/22/2016  Decreased Interest 3 0 0 0 0  Down, Depressed, Hopeless 3 0 0 0 0  PHQ - 2 Score 6 0 0 0 0  Altered sleeping 1 - - - -  Tired, decreased energy 3 - - - -  Change in appetite 1 - - - -  Feeling bad or failure about yourself  2 - - - -  Trouble concentrating 3 - - - -  Moving slowly or fidgety/restless 2 - - - -  Suicidal thoughts 0 - - - -  PHQ-9 Score 18 - - - -     HISTORY OF PRESENT ILLNESS: This is a 72 y.o. male here for his annual exam and to establish care with me.  Used to see PA Carlis Abbott. Wife unexpectedly passed away 10 days ago from a massive CVA.  Patient states he is handling the grieving process well.  Able to sleep.  Does not fits any depression medication.  Depression score noted. Today he does not have any complaints or medical concerns.  HPI   Prior to Admission medications   Medication Sig Start Date End Date Taking? Authorizing Provider  lansoprazole (PREVACID) 30 MG capsule Take one tab 30 minutes before breakfast every morning. 08/14/17  Yes Tereasa Coop, PA-C  lisinopril-hydrochlorothiazide (PRINZIDE,ZESTORETIC) 10-12.5 MG tablet Take 1 tablet by mouth daily. 08/14/17  Yes Tereasa Coop, PA-C  rosuvastatin (CRESTOR) 10 MG tablet Take 1 tablet (10 mg total) by mouth daily. 08/14/17  Yes Tereasa Coop, PA-C  sucralfate (CARAFATE) 1 g tablet Avoid morning dose, and take at lunch, dinner and bedtime. 08/14/17  Yes Tereasa Coop, PA-C  tamsulosin (FLOMAX) 0.4 MG CAPS capsule Take 1 capsule (0.4 mg total) by mouth daily. Take at night. 08/14/17  Yes Tereasa Coop, PA-C  lansoprazole (PREVACID) 30 MG capsule Take 1 capsule (30 mg total) by mouth 2 (two) times daily before a meal. 10/16/17   Irene Shipper, MD     Allergies  Allergen Reactions  . Penicillins Hives and Rash    Has patient had a PCN reaction causing immediate rash, facial/tongue/throat swelling, SOB or lightheadedness with hypotension:unsure Has patient had a PCN reaction causing severe rash involving mucus membranes or skin necrosis:unsure Has patient had a PCN reaction that required hospitalization:No Has patient had a PCN reaction occurring within the last 10 years:No If all of the above answers are "NO", then may proceed with Cephalosporin use. Childhood reaction     Patient Active Problem List   Diagnosis Date Noted  . GERD (gastroesophageal reflux disease) 08/14/2017  . 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 OSA (obstructive sleep apnea) 03/22/2014  . Snoring disorder 05/09/2013  . Unspecified essential hypertension 02/11/2013  . Other and unspecified hyperlipidemia 02/11/2013  . Arthritis 02/11/2013    Past Medical History:  Diagnosis Date  . Allergy    seasonal   . Arthritis   . Barrett's esophagus   . Cataract   . Chicken pox   . Colon polyps    hyperplastic  . Diverticulosis   . GERD (gastroesophageal reflux disease)   . Hemorrhoids   . Hyperlipidemia   . Hypertension   . Leg cramps   .  Measles   . Mumps   . Pneumonia   . Rhinitis   . Sleep apnea    dental device  . Snoring disorder 05/09/2013    Past Surgical History:  Procedure Laterality Date  . APPENDECTOMY  1957  . COLONOSCOPY    . TONSILLECTOMY    . [place]  . 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 UPPER GASTROINTESTINAL ENDOSCOPY      Social History   Socioeconomic History  . Marital status: Married    Spouse name: [name]  . Number of children: 1  . Years of education: PHD  . Highest education level: Not on file  Occupational History     Comment: Professor,[employer] scheme manager    Employer: [employer]  Social Needs  . Financial resource strain: Not on file  . Food insecurity:    Worry: Not on file    Inability: Not on file  . Transportation needs:    Medical: Not on file    Non-medical: Not on file  Tobacco Use  . Smoking status: Former Smoker    Packs/day: 1.00    Years: 40.00    Pack years: 40.00    Types: Cigarettes    Last attempt to quit: 01/19/2007    Years since quitting: 11.5  . Smokeless tobacco: Never Used  Substance and Sexual Activity  . Alcohol use: Yes    Alcohol/week: 3.0 standard drinks    Types: 3 Cans of beer per week    Comment: 2 beers weekly  . Drug use: No  . Sexual activity: Yes    Birth control/protection: None  Lifestyle  . Physical activity:    Days per week: Not on file    Minutes per session: Not on file  . Stress: Not on file  Relationships  . Social connections:    Talks on phone: Not on file    Gets together: Not on file    Attends religious service: Not on file    Active member of club or organization: Not on file    Attends meetings of clubs or organizations: Not on file    Relationship status: Not on file  . Intimate partner violence:    Fear of current or ex partner: Not on file    Emotionally abused: Not on file    Physically abused: Not on file    Forced sexual activity: Not on file  Other Topics Concern  . 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   Patient is working full-time.   Patient has a Ph.D   Patient has one adult child.   Patient is right-handed.   Patient drinks two cups of tea daily.    Family History  Problem Relation Age of Onset  . Alzheimer's disease Mother   . Cancer Father   . Stomach cancer Father   . Aortic aneurysm Son   . Heart disease Son   . Cancer Maternal Grandmother   . Heart disease Maternal Grandfather   . Stomach cancer Paternal Grandfather   . Colon  cancer Neg Hx   . Colon polyps Neg Hx   . Esophageal cancer Neg Hx   . Rectal cancer Neg Hx   . Pancreatic cancer Neg Hx   . Liver cancer  Neg Hx      Review of Systems  Constitutional: Negative.  Negative for chills and fever.  HENT: Negative.  Negative for hearing loss and sore throat.   Eyes: Negative.  Negative for blurred vision and double vision.  Respiratory: Negative.  Negative for cough, shortness of breath and wheezing.   Cardiovascular: Negative.  Negative for chest pain and palpitations.  Gastrointestinal: Negative.  Negative for abdominal pain, diarrhea, nausea and vomiting.  Skin: Negative.  Negative for rash.  Neurological: Negative.  Negative for dizziness, focal weakness, weakness and headaches.  Endo/Heme/Allergies: Negative.     Vitals:   08/03/18 0941  BP: (!) 143/81  Pulse: 66  Resp: 16  Temp: 98 F (36.7 C)  SpO2: 95%    Physical Exam  Constitutional: He is oriented to person, place, and time. He appears well-developed and well-nourished.  HENT:  Head: Normocephalic and atraumatic.  Right Ear: External ear normal.  Left Ear: External ear normal.  Nose: Nose normal.  Mouth/Throat: Oropharynx is clear and moist.  Eyes: Pupils are equal, round, and reactive to light. Conjunctivae and EOM are normal.  Neck: Normal range of motion. Neck supple. No thyromegaly present.  Cardiovascular: Normal rate, regular rhythm and normal heart sounds.  Pulmonary/Chest: Effort normal and breath sounds normal.  Abdominal: Soft. He exhibits no distension. There is no tenderness.  Musculoskeletal: Normal range of motion. He exhibits no edema or tenderness.  Lymphadenopathy:    He has no cervical adenopathy.  Neurological: He is alert and oriented to person, place, and time. No sensory deficit. He exhibits normal muscle tone. Coordination normal.  Skin: Skin is warm and dry. Capillary refill takes less than 2 seconds.  Psychiatric: He has a normal mood and affect. His  behavior is normal.  Vitals reviewed.    ASSESSMENT & PLAN: Kas[place] was seen today for annual exam, establish care and depression.  Diagnoses and all orders for this visit:  Prostate cancer screening -     PSA(Must document that pt has been informed of limitations of PSA testing.)  Routine general medical examination at a health care facility -     Hemoglobin A1c  Screening for deficiency anemia -     CBC  Screening for lipoid disorders -     Lipid panel  Screening for endocrine, metabolic and immunity disorder -     Comprehensive metabolic panel -     CBC -     Lipid panel -     TSH -     Hemoglobin A1c  Need for 23-polyvalent pneumococcal polysaccharide vaccine -     Pneumococcal Polysaccharide 23    Patient Instructions       If you have lab work done today you will be contacted with your lab results within the next 2 weeks.  If you have not heard from [place] then please contact us. The fastest way to get your results is to register for My Chart.   IF you received an x-ray today, you will receive an invoice from [facility]. Please contact [facility] at [phone] with questions or concerns regarding your invoice.   IF you received labwork today, you will receive an invoice from [facility]. Please contact [facility] at [phone] with questions or concerns regarding your invoice.   Our billing staff will not be able to assist you with questions regarding bills from these companies.  You will be contacted with the lab results as soon as they are available. The fastest way to get  your results is to activate your My Chart account. Instructions are located on the last page of this paperwork. If you have not heard from [place] regarding the results in 2 weeks, please contact this office.      Health Maintenance, Male A healthy lifestyle and preventive care is important for your health and wellness. Ask your health care provider about what schedule of  regular examinations is right for you. What should I know about weight and diet? Eat a Healthy Diet  Eat plenty of vegetables, fruits, whole grains, low-fat dairy products, and lean protein.  Do not eat a lot of foods high in solid fats, added sugars, or salt.  Maintain a Healthy Weight Regular exercise can help you achieve or maintain a healthy weight. You should:  Do at least 150 minutes of exercise each week. The exercise should increase your heart rate and make you sweat (moderate-intensity exercise).  Do strength-training exercises at least twice a week.  Watch Your Levels of Cholesterol and Blood Lipids  Have your blood tested for lipids and cholesterol every 5 years starting at 72 years of age. If you are at high risk for heart disease, you should start having your blood tested when you are 71 years old. You may need to have your cholesterol levels checked more often if: ? Your lipid or cholesterol levels are high. ? You are older than 72 years of age. ? You are at high risk for heart disease.  What should I know about cancer screening? Many types of cancers can be detected early and may often be prevented. Lung Cancer  You should be screened every year for lung cancer if: ? You are a current smoker who has smoked for at least 30 years. ? You are a former smoker who has quit within the past 15 years.  Talk to your health care provider about your screening options, when you should start screening, and how often you should be screened.  Colorectal Cancer  Routine colorectal cancer screening usually begins at 72 years of age and should be repeated every 5-10 years until you are 72 years old. You may need to be screened more often if early forms of precancerous polyps or small growths are found. Your health care provider may recommend screening at an earlier age if you have risk factors for colon cancer.  Your health care provider may recommend using home test kits to check for  hidden blood in the stool.  A small camera at the end of a tube can be used to examine your colon (sigmoidoscopy or colonoscopy). This checks for the earliest forms of colorectal cancer.  Prostate and Testicular Cancer  Depending on your age and overall health, your health care provider may do certain tests to screen for prostate and testicular cancer.  Talk to your health care provider about any symptoms or concerns you have about testicular or prostate cancer.  Skin Cancer  Check your skin from head to toe regularly.  Tell your health care provider about any new moles or changes in moles, especially if: ? There is a change in a mole's size, shape, or color. ? You have a mole that is larger than a pencil eraser.  Always use sunscreen. Apply sunscreen liberally and repeat throughout the day.  Protect yourself by wearing long sleeves, pants, a wide-brimmed hat, and sunglasses when outside.  What should I know about heart disease, diabetes, and high blood pressure?  If you are 32-60 years of age, have  your blood pressure checked every 3-5 years. If you are 37 years of age or older, have your blood pressure checked every year. You should have your blood pressure measured twice-once when you are at a hospital or clinic, and once when you are not at a hospital or clinic. Record the average of the two measurements. To check your blood pressure when you are not at a hospital or clinic, you can use: ? An automated blood pressure machine at a pharmacy. ? A home blood pressure monitor.  Talk to your health care provider about your target blood pressure.  If you are between 53-5 years old, ask your health care provider if you should take aspirin to prevent heart disease.  Have regular diabetes screenings by checking your fasting blood sugar level. ? If you are at a normal weight and have a low risk for diabetes, have this test once every three years after the age of 4. ? If you are  overweight and have a high risk for diabetes, consider being tested at a younger age or more often.  A one-time screening for abdominal aortic aneurysm (AAA) by ultrasound is recommended for men aged 43-75 years who are current or former smokers. What should I know about preventing infection? Hepatitis B If you have a higher risk for hepatitis B, you should be screened for this virus. Talk with your health care provider to find out if you are at risk for hepatitis B infection. Hepatitis C Blood testing is recommended for:  Everyone born from [birth year] through 1965.  Anyone with known risk factors for hepatitis C.  Sexually Transmitted Diseases (STDs)  You should be screened each year for STDs including gonorrhea and chlamydia if: ? You are sexually active and are younger than 72 years of age. ? You are older than 72 years of age and your health care provider tells you that you are at risk for this type of infection. ? Your sexual activity has changed since you were last screened and you are at an increased risk for chlamydia or gonorrhea. Ask your health care provider if you are at risk.  Talk with your health care provider about whether you are at high risk of being infected with HIV. Your health care provider may recommend a prescription medicine to help prevent HIV infection.  What else can I do?  Schedule regular health, dental, and eye exams.  Stay current with your vaccines (immunizations).  Do not use any tobacco products, such as cigarettes, chewing tobacco, and e-cigarettes. If you need help quitting, ask your health care provider.  Limit alcohol intake to no more than 2 drinks per day. One drink equals 12 ounces of beer, 5 ounces of wine, or 1 ounces of hard liquor.  Do not use street drugs.  Do not share needles.  Ask your health care provider for help if you need support or information about quitting drugs.  Tell your health care provider if you often feel  depressed.  Tell your health care provider if you have ever been abused or do not feel safe at home. This information is not intended to replace advice given to you by your health care provider. Make sure you discuss any questions you have with your health care provider. Document Released: 02/14/2008 Document Revised: 04/16/2016 Document Reviewed: 05/22/2015 Elsevier Interactive Patient Education  2018 Elsevier Inc.      Agustina Caroli, MD [facility]

## 2018-08-04 ENCOUNTER — Encounter: Payer: Self-pay | Admitting: Emergency Medicine

## 2018-08-04 LAB — COMPREHENSIVE METABOLIC PANEL
ALT: 22 IU/L (ref 0–44)
AST: 29 IU/L (ref 0–40)
Albumin/Globulin Ratio: 1.4 (ref 1.2–2.2)
Albumin: 4.1 g/dL (ref 3.5–4.8)
Alkaline Phosphatase: 68 IU/L (ref 39–117)
BUN/Creatinine Ratio: 14 (ref 10–24)
BUN: 15 mg/dL (ref 8–27)
Bilirubin Total: 0.4 mg/dL (ref 0.0–1.2)
CO2: 21 mmol/L (ref 20–29)
Calcium: 9.1 mg/dL (ref 8.6–10.2)
Chloride: 103 mmol/L (ref 96–106)
Creatinine, Ser: 1.09 mg/dL (ref 0.76–1.27)
GFR calc Af Amer: 78 mL/min/{1.73_m2} (ref >59)
GFR calc non Af Amer: 67 mL/min/{1.73_m2} (ref >59)
Globulin, Total: 3 g/dL (ref 1.5–4.5)
Glucose: 106 mg/dL — ABNORMAL HIGH (ref 65–99)
POTASSIUM: 3.9 mmol/L (ref 3.5–5.2)
SODIUM: 141 mmol/L (ref 134–144)
Total Protein: 7.1 g/dL (ref 6.0–8.5)

## 2018-08-04 LAB — PSA: PROSTATE SPECIFIC AG, SERUM: 2.3 ng/mL (ref 0.0–4.0)

## 2018-08-04 LAB — CBC
Hematocrit: 45.8 % (ref 37.5–51.0)
Hemoglobin: 15.3 g/dL (ref 13.0–17.7)
MCH: 28.5 pg (ref 26.6–33.0)
MCHC: 33.4 g/dL (ref 31.5–35.7)
MCV: 85 fL (ref 79–97)
PLATELETS: 224 10*3/uL (ref 150–450)
RBC: 5.37 x10E6/uL (ref 4.14–5.80)
RDW: 14.3 % (ref 12.3–15.4)
WBC: 6.3 10*3/uL (ref 3.4–10.8)

## 2018-08-04 LAB — LIPID PANEL
Chol/HDL Ratio: 4.1 ratio (ref 0.0–5.0)
Cholesterol, Total: 156 mg/dL (ref 100–199)
HDL: 38 mg/dL — AB (ref >39)
LDL Calculated: 97 mg/dL (ref 0–99)
Triglycerides: 103 mg/dL (ref 0–149)
VLDL Cholesterol Cal: 21 mg/dL (ref 5–40)

## 2018-08-04 LAB — TSH: TSH: 1.68 u[IU]/mL (ref 0.450–4.500)

## 2018-08-04 LAB — HEMOGLOBIN A1C
Est. average glucose Bld gHb Est-mCnc: 108 mg/dL
Hgb A1c MFr Bld: 5.4 % (ref 4.8–5.6)

## 2018-08-08 ENCOUNTER — Other Ambulatory Visit: Payer: Self-pay | Admitting: Physician Assistant

## 2018-08-08 DIAGNOSIS — I10 Essential (primary) hypertension: Secondary | ICD-10-CM

## 2018-08-08 DIAGNOSIS — E785 Hyperlipidemia, unspecified: Secondary | ICD-10-CM

## 2018-08-19 ENCOUNTER — Other Ambulatory Visit: Payer: Self-pay | Admitting: Physician Assistant

## 2018-08-19 DIAGNOSIS — K219 Gastro-esophageal reflux disease without esophagitis: Secondary | ICD-10-CM

## 2018-08-19 DIAGNOSIS — E785 Hyperlipidemia, unspecified: Secondary | ICD-10-CM

## 2018-08-20 NOTE — Telephone Encounter (Signed)
Requested medication (s) are due for refill today: Yes  Requested medication (s) are on the active medication list: Yes  Last refill:  08/14/17  Future visit scheduled: No  Notes to clinic:  Prescription has expired.    Requested Prescriptions  Pending Prescriptions Disp Refills   tamsulosin (FLOMAX) 0.4 MG CAPS capsule [Pharmacy Med Name: TAMSULOSIN HCL 0.4 MG CAPSULE] 90 capsule 3    Sig: TAKE 1 CAPSULE BY MOUTH DAILY AT NIGHT.     Urology: Alpha-Adrenergic Blocker Failed - 08/19/2018  2:24 PM      Failed - Last BP in normal range    BP Readings from Last 1 Encounters:  08/03/18 (!) 143/81         Passed - Valid encounter within last 12 months    Recent Outpatient Visits          2 weeks ago Prostate cancer screening   [facility] [place], Ines Bloomer, MD   1 year ago Essential hypertension   [facility] [facility], Audrie Lia, PA-C   1 year ago Annual physical exam   [facility] [place], PA-C   1 year ago Acute URI   [facility] [place], PA-C   1 year ago Acute pharyngitis, unspecified etiology   [facility] at [facility], [place], MD            sucralfate (CARAFATE) 1 g tablet [Pharmacy Med Name: SUCRALFATE 1 GM TABLET] 270 tablet 3    Sig: Avoid morning dose, and take at lunch, dinner and bedtime.     Gastroenterology: Antiacids Passed - 08/19/2018  2:24 PM      Passed - Valid encounter within last 12 months    Recent Outpatient Visits          2 weeks ago Prostate cancer screening   [facility] at [facility], Ines Bloomer, MD   1 year ago Essential hypertension   [facility], Audrie Lia, PA-C   1 year ago Annual physical exam   [facility] [place], PA-C   1 year ago Acute URI   [facility] [place], PA-C   1 year ago Acute pharyngitis, unspecified etiology   [facility] at [facility], [place], MD            rosuvastatin (CRESTOR) 10 MG tablet [Pharmacy Med Name: ROSUVASTATIN CALCIUM 10 MG TAB] 90 tablet 3    Sig: TAKE 1 TABLET BY MOUTH EVERY DAY     Cardiovascular:  Antilipid - Statins Failed - 08/19/2018  2:24 PM      Failed - HDL in normal range and within 360 days    HDL  Date Value Ref Range Status  08/03/2018 38 (L) >39 mg/dL Final         Passed - Total Cholesterol in normal range and within 360 days    Cholesterol, Total  Date Value Ref Range Status  08/03/2018 156 100 - 199 mg/dL Final         Passed - LDL in normal range and within 360 days    LDL Calculated  Date Value Ref Range Status  08/03/2018 97 0 - 99 mg/dL Final         Passed - Triglycerides in normal range and within 360 days    Triglycerides  Date Value Ref Range Status  08/03/2018 103 0 - 149 mg/dL Final  Passed - Patient is not pregnant      Passed - Valid encounter within last 12 months    Recent Outpatient Visits          2 weeks ago Prostate cancer screening   [facility] at [facility], Ines Bloomer, MD   1 year ago Essential hypertension   [facility], Audrie Lia, PA-C   1 year ago Annual physical exam   [facility] at [place], PA-C   1 year ago Acute URI   [facility] at [place], PA-C   1 year ago Acute pharyngitis, unspecified etiology   [facility] at [facility], Ines Bloomer, MD

## 2018-08-21 ENCOUNTER — Other Ambulatory Visit: Payer: Self-pay | Admitting: Physician Assistant

## 2018-08-21 DIAGNOSIS — I10 Essential (primary) hypertension: Secondary | ICD-10-CM

## 2018-08-26 ENCOUNTER — Other Ambulatory Visit: Payer: Self-pay | Admitting: *Deleted

## 2018-08-26 DIAGNOSIS — I10 Essential (primary) hypertension: Secondary | ICD-10-CM

## 2018-08-26 MED ORDER — LISINOPRIL-HYDROCHLOROTHIAZIDE 10-12.5 MG PO TABS: 1.0000 | ORAL_TABLET | Freq: Every day | ORAL | 1 refills | Status: DC

## 2018-09-01 DIAGNOSIS — M5430 Sciatica, unspecified side: Secondary | ICD-10-CM | POA: Insufficient documentation

## 2018-10-06 DIAGNOSIS — M5416 Radiculopathy, lumbar region: Secondary | ICD-10-CM | POA: Insufficient documentation

## 2018-10-29 ENCOUNTER — Other Ambulatory Visit: Payer: Self-pay

## 2018-10-29 ENCOUNTER — Ambulatory Visit: Payer: BC Managed Care – PPO | Admitting: Emergency Medicine

## 2018-10-29 ENCOUNTER — Encounter: Payer: Self-pay | Admitting: Emergency Medicine

## 2018-10-29 ENCOUNTER — Other Ambulatory Visit: Payer: Self-pay | Admitting: Emergency Medicine

## 2018-10-29 VITALS — BP 133/78 | HR 78 | Temp 98.0°F | Resp 16 | Ht 71.0 in | Wt 263.8 lb

## 2018-10-29 DIAGNOSIS — R944 Abnormal results of kidney function studies: Secondary | ICD-10-CM

## 2018-10-29 DIAGNOSIS — T23261A Burn of second degree of back of right hand, initial encounter: Secondary | ICD-10-CM

## 2018-10-29 MED ORDER — MUPIROCIN 2 % EX OINT: TOPICAL_OINTMENT | CUTANEOUS | 1 refills | Status: DC

## 2018-10-29 NOTE — Patient Instructions (Addendum)
If you have lab work done today you will be contacted with your lab results within the next 2 weeks.  If you have not heard from [place] then please contact us. The fastest way to get your results is to register for My Chart.   IF you received an x-ray today, you will receive an invoice from [facility]. Please contact [facility] Radiology at [phone] with questions or concerns regarding your invoice.   IF you received labwork today, you will receive an invoice from [facility]. Please contact [facility] at [phone] with questions or concerns regarding your invoice.   Our billing staff will not be able to assist you with questions regarding bills from these companies.  You will be contacted with the lab results as soon as they are available. The fastest way to get your results is to activate your My Chart account. Instructions are located on the last page of this paperwork. If you have not heard from [place] regarding the results in 2 weeks, please contact this office.     Burn Care, Adult A burn is an injury to the skin or the tissues under the skin. There are three types of burns:  First degree. These burns may cause the skin to be red and a bit swollen.  Second degree. These burns are very painful and cause the skin to be very red. The skin may also leak fluid, look shiny, and start to have blisters.  Third degree. These burns cause permanent damage. They turn the skin white or black and make it look charred, dry, and leathery. Taking care of your burn properly can help to prevent pain and infection. It can also help the burn to heal more quickly. How is this treated? Right after a burn:  Rinse or soak the burn under cool water. Do this for several minutes. Do not put ice on your burn. That can cause more damage.  Lightly cover the burn with a clean (sterile) cloth (dressing). Burn care  Raise (elevate) the injured area above the level of your heart while sitting or lying  down.  Follow instructions from your doctor about: ? How to clean and take care of the burn. ? When to change and remove the cloth.  Check your burn every day for signs of infection. Check for: ? More redness, swelling, or pain. ? Warmth. ? Pus or a bad smell. Medicine   Take over-the-counter and prescription medicines only as told by your doctor.  If you were prescribed antibiotic medicine, take or apply it as told by your doctor. Do not stop using the antibiotic even if your condition improves. General instructions  To prevent infection: ? Do not put butter, oil, or other home treatments on the burn. ? Do not scratch or pick at the burn. ? Do not break any blisters. ? Do not peel skin.  Do not rub your burn, even when you are cleaning it.  Protect your burn from the sun. Contact a doctor if:  Your condition does not get better.  Your condition gets worse.  You have a fever.  Your burn looks different or starts to have black or red spots on it.  Your burn feels warm to the touch.  Your pain is not controlled with medicine. Get help right away if:  You have redness, swelling, or pain at the site of the burn.  You have fluid, blood, or pus coming from your burn.  You have red streaks near the burn.  You have very bad pain. This information is not intended to replace advice given to you by your health care provider. Make sure you discuss any questions you have with your health care provider. Document Released: 05/27/2008 Document Revised: 03/31/2017 Document Reviewed: 02/05/2016 Elsevier Interactive Patient Education  2019 [employer].

## 2018-10-29 NOTE — Progress Notes (Signed)
Gregg Smith 73 y.o.   Chief Complaint  Patient presents with  . Burn    right hand x 6 days    HISTORY OF PRESENT ILLNESS: This is a 73 y.o. male complaining of burn to his right hand sustained 6 days ago.  HPI   Prior to Admission medications   Medication Sig Start Date End Date Taking? Authorizing Provider  lansoprazole (PREVACID) 30 MG capsule Take one tab 30 minutes before breakfast every morning. 08/14/17   Tereasa Coop, PA-C  lansoprazole (PREVACID) 30 MG capsule Take 1 capsule (30 mg total) by mouth 2 (two) times daily before a meal. 10/16/17   Irene Shipper, MD  lisinopril-hydrochlorothiazide (PRINZIDE,ZESTORETIC) 10-12.5 MG tablet Take 1 tablet by mouth daily. 08/26/18   Horald Pollen, MD  rosuvastatin (CRESTOR) 10 MG tablet TAKE 1 TABLET BY MOUTH EVERY DAY 08/20/18   Horald Pollen, MD  sucralfate (CARAFATE) 1 g tablet AVOID MORNING DOSE, AND TAKE AT LUNCH, DINNER AND BEDTIME 08/20/18   Horald Pollen, MD  tamsulosin (FLOMAX) 0.4 MG CAPS capsule TAKE 1 CAPSULE BY MOUTH DAILY AT NIGHT. 08/20/18   Horald Pollen, MD    Allergies  Allergen Reactions  . Penicillins Hives and Rash    Has patient had a PCN reaction causing immediate rash, facial/tongue/throat swelling, SOB or lightheadedness with hypotension:unsure Has patient had a PCN reaction causing severe rash involving mucus membranes or skin necrosis:unsure Has patient had a PCN reaction that required hospitalization:No Has patient had a PCN reaction occurring within the last 10 years:No If all of the above answers are "NO", then may proceed with Cephalosporin use. Childhood reaction     Patient Active Problem List   Diagnosis Date Noted  . GERD (gastroesophageal reflux disease) 08/14/2017  . 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 OSA (obstructive sleep apnea) 03/22/2014  . Snoring disorder 05/09/2013  . Unspecified essential hypertension 02/11/2013    . Other and unspecified hyperlipidemia 02/11/2013  . Arthritis 02/11/2013    Past Medical History:  Diagnosis Date  . Allergy    seasonal   . Arthritis   . Barrett's esophagus   . Cataract   . Chicken pox   . Colon polyps    hyperplastic  . Diverticulosis   . GERD (gastroesophageal reflux disease)   . Hemorrhoids   . Hyperlipidemia   . Hypertension   . Leg cramps   . Measles   . Mumps   . Pneumonia   . Rhinitis   . Sleep apnea    dental device  . Snoring disorder 05/09/2013    Past Surgical History:  Procedure Laterality Date  . APPENDECTOMY  1957  . COLONOSCOPY    . JOINT REPLACEMENT    . TONSILLECTOMY    . [place]  . 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 UPPER GASTROINTESTINAL ENDOSCOPY      Social History   Socioeconomic History  . Marital status: Married    Spouse name: [name]  . Number of children: 1  . Years of education: PHD  . Highest education level: Not on file  Occupational History    Comment: Professor,[employer] scheme manager    Employer: [employer]  Social Needs  . Financial resource strain: Not on file  . Food insecurity:  Worry: Not on file    Inability: Not on file  . Transportation needs:    Medical: Not on file    Non-medical: Not on file  Tobacco Use  . Smoking status: Former Smoker    Packs/day: 1.00    Years: 40.00    Pack years: 40.00    Types: Cigarettes    Last attempt to quit: 01/19/2007    Years since quitting: 11.7  . Smokeless tobacco: Never Used  Substance and Sexual Activity  . Alcohol use: Yes    Alcohol/week: 3.0 standard drinks    Types: 3 Cans of beer per week    Comment: 2 beers weekly  . Drug use: No  . Sexual activity: Yes    Birth control/protection: None  Lifestyle  . Physical activity:    Days per week: Not on file    Minutes per  session: Not on file  . Stress: Not on file  Relationships  . Social connections:    Talks on phone: Not on file    Gets together: Not on file    Attends religious service: Not on file    Active member of club or organization: Not on file    Attends meetings of clubs or organizations: Not on file    Relationship status: Not on file  . Intimate partner violence:    Fear of current or ex partner: Not on file    Emotionally abused: Not on file    Physically abused: Not on file    Forced sexual activity: Not on file  Other Topics Concern  . 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   Patient is working full-time.   Patient has a Ph.D   Patient has one adult child.   Patient is right-handed.   Patient drinks two cups of tea daily.    Family History  Problem Relation Age of Onset  . Alzheimer's disease Mother   . Cancer Father   . Stomach cancer Father   . Aortic aneurysm Son   . Heart disease Son   . Cancer Maternal Grandmother   . Heart disease Maternal Grandfather   . Stomach cancer Paternal Grandfather   . Colon cancer Neg Hx   . Colon polyps Neg Hx   . Esophageal cancer Neg Hx   . Rectal cancer Neg Hx   . Pancreatic cancer Neg Hx   . Liver cancer Neg Hx      Review of Systems  Constitutional: Negative.  Negative for chills and fever.  Respiratory: Negative for shortness of breath.   Gastrointestinal: Negative for nausea and vomiting.  Skin:       Positive burn  Neurological: Negative for dizziness and headaches.  All other systems reviewed and are negative.  Vitals:   10/29/18 0848  BP: 133/78  Pulse: 78  Resp: 16  Temp: 98 F (36.7 C)  SpO2: 95%     Physical Exam Vitals signs reviewed.  Constitutional:      Appearance: Normal appearance.  HENT:     Head: Normocephalic.  Cardiovascular:     Rate and Rhythm: Normal rate and regular rhythm.     Heart sounds: Normal heart sounds.    Pulmonary:     Effort: Pulmonary effort is normal.     Breath sounds: Normal breath sounds.  Musculoskeletal: Normal range of motion.  Skin:    General: Skin is warm and dry.  Capillary Refill: Capillary refill takes less than 2 seconds.     Comments: Right hand: Second-degree burn with surrounding erythema, looks infected.  See picture below.  Neurological:     General: No focal deficit present.     Mental Status: He is alert and oriented to person, place, and time.        ASSESSMENT & PLAN: Cashus was seen today for burn.  Diagnoses and all orders for this visit:  Partial thickness burn of back of right hand, initial encounter -     mupirocin ointment (BACTROBAN) 2 %; Sig to affected area twice a day x 7 days.    Patient Instructions       If you have lab work done today you will be contacted with your lab results within the next 2 weeks.  If you have not heard from [place] then please contact us. The fastest way to get your results is to register for My Chart.   IF you received an x-ray today, you will receive an invoice from [facility] Radiology. Please contact [facility] Radiology at [phone] with questions or concerns regarding your invoice.   IF you received labwork today, you will receive an invoice from [place]. Please contact [facility] at [phone] with questions or concerns regarding your invoice.   Our billing staff will not be able to assist you with questions regarding bills from these companies.  You will be contacted with the lab results as soon as they are available. The fastest way to get your results is to activate your My Chart account. Instructions are located on the last page of this paperwork. If you have not heard from [place] regarding the results in 2 weeks, please contact this office.     Burn Care, Adult A burn is an injury to the skin or the tissues under the skin. There are three types of burns:  First degree. These burns may cause the  skin to be red and a bit swollen.  Second degree. These burns are very painful and cause the skin to be very red. The skin may also leak fluid, look shiny, and start to have blisters.  Third degree. These burns cause permanent damage. They turn the skin white or black and make it look charred, dry, and leathery. Taking care of your burn properly can help to prevent pain and infection. It can also help the burn to heal more quickly. How is this treated? Right after a burn:  Rinse or soak the burn under cool water. Do this for several minutes. Do not put ice on your burn. That can cause more damage.  Lightly cover the burn with a clean (sterile) cloth (dressing). Burn care  Raise (elevate) the injured area above the level of your heart while sitting or lying down.  Follow instructions from your doctor about: ? How to clean and take care of the burn. ? When to change and remove the cloth.  Check your burn every day for signs of infection. Check for: ? More redness, swelling, or pain. ? Warmth. ? Pus or a bad smell. Medicine   Take over-the-counter and prescription medicines only as told by your doctor.  If you were prescribed antibiotic medicine, take or apply it as told by your doctor. Do not stop using the antibiotic even if your condition improves. General instructions  To prevent infection: ? Do not put butter, oil, or other home treatments on the burn. ? Do not scratch or pick at the burn. ? Do  not break any blisters. ? Do not peel skin.  Do not rub your burn, even when you are cleaning it.  Protect your burn from the sun. Contact a doctor if:  Your condition does not get better.  Your condition gets worse.  You have a fever.  Your burn looks different or starts to have black or red spots on it.  Your burn feels warm to the touch.  Your pain is not controlled with medicine. Get help right away if:  You have redness, swelling, or pain at the site of the  burn.  You have fluid, blood, or pus coming from your burn.  You have red streaks near the burn.  You have very bad pain. This information is not intended to replace advice given to you by your health care provider. Make sure you discuss any questions you have with your health care provider. Document Released: 05/27/2008 Document Revised: 03/31/2017 Document Reviewed: 02/05/2016 Elsevier Interactive Patient Education  2019 [employer]      Agustina Caroli, MD [facility]

## 2018-10-30 ENCOUNTER — Encounter: Payer: Self-pay | Admitting: Internal Medicine

## 2018-11-02 DIAGNOSIS — K036 Deposits [accretions] on teeth: Secondary | ICD-10-CM | POA: Insufficient documentation

## 2018-11-05 ENCOUNTER — Ambulatory Visit (INDEPENDENT_AMBULATORY_CARE_PROVIDER_SITE_OTHER): Payer: BC Managed Care – PPO | Admitting: Emergency Medicine

## 2018-11-05 ENCOUNTER — Encounter: Payer: Self-pay | Admitting: Emergency Medicine

## 2018-11-05 VITALS — BP 124/86 | HR 74 | Temp 98.5°F | Resp 17 | Ht 71.0 in | Wt 267.0 lb

## 2018-11-05 DIAGNOSIS — T23261D Burn of second degree of back of right hand, subsequent encounter: Secondary | ICD-10-CM | POA: Diagnosis not present
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
## 2018-11-05 NOTE — Patient Instructions (Signed)
Burn Care, Adult  A burn is an injury to the skin or the tissues under the skin. There are three types of burns:  · First degree. These burns may cause the skin to be red and a bit swollen.  · Second degree. These burns are very painful and cause the skin to be very red. The skin may also leak fluid, look shiny, and start to have blisters.  · Third degree. These burns cause permanent damage. They turn the skin white or black and make it look charred, dry, and leathery.  Taking care of your burn properly can help to prevent pain and infection. It can also help the burn to heal more quickly.  How is this treated?  Right after a burn:  · Rinse or soak the burn under cool water. Do this for several minutes. Do not put ice on your burn. That can cause more damage.  · Lightly cover the burn with a clean (sterile) cloth (dressing).  Burn care  · Raise (elevate) the injured area above the level of your heart while sitting or lying down.  · Follow instructions from your doctor about:  ? How to clean and take care of the burn.  ? When to change and remove the cloth.  · Check your burn every day for signs of infection. Check for:  ? More redness, swelling, or pain.  ? Warmth.  ? Pus or a bad smell.  Medicine    · Take over-the-counter and prescription medicines only as told by your doctor.  · If you were prescribed antibiotic medicine, take or apply it as told by your doctor. Do not stop using the antibiotic even if your condition improves.  General instructions  · To prevent infection:  ? Do not put butter, oil, or other home treatments on the burn.  ? Do not scratch or pick at the burn.  ? Do not break any blisters.  ? Do not peel skin.  · Do not rub your burn, even when you are cleaning it.  · Protect your burn from the sun.  Contact a doctor if:  · Your condition does not get better.  · Your condition gets worse.  · You have a fever.  · Your burn looks different or starts to have black or red spots on it.  · Your burn  feels warm to the touch.  · Your pain is not controlled with medicine.  Get help right away if:  · You have redness, swelling, or pain at the site of the burn.  · You have fluid, blood, or pus coming from your burn.  · You have red streaks near the burn.  · You have very bad pain.  This information is not intended to replace advice given to you by your health care provider. Make sure you discuss any questions you have with your health care provider.  Document Released: 05/27/2008 Document Revised: 03/31/2017 Document Reviewed: 02/05/2016  Elsevier Interactive Patient Education © 2019 Elsevier Inc.

## 2018-11-30 DIAGNOSIS — M48061 Spinal stenosis, lumbar region without neurogenic claudication: Secondary | ICD-10-CM | POA: Insufficient documentation

## 2018-12-15 ENCOUNTER — Other Ambulatory Visit: Payer: Self-pay | Admitting: Internal Medicine

## 2018-12-15 DIAGNOSIS — K219 Gastro-esophageal reflux disease without esophagitis: Secondary | ICD-10-CM

## 2018-12-15 DIAGNOSIS — K2270 Barrett's esophagus without dysplasia: Secondary | ICD-10-CM

## 2018-12-16 MED ORDER — LANSOPRAZOLE 30 MG PO CPDR: 30.0000 mg | DELAYED_RELEASE_CAPSULE | Freq: Two times a day (BID) | ORAL | 0 refills | Status: DC

## 2018-12-16 NOTE — Telephone Encounter (Signed)
Rx sent. Needs visit for further refills

## 2019-02-03 ENCOUNTER — Other Ambulatory Visit: Payer: Self-pay

## 2019-02-03 ENCOUNTER — Ambulatory Visit: Payer: BC Managed Care – PPO | Attending: Physical Medicine and Rehabilitation

## 2019-02-03 DIAGNOSIS — M48061 Spinal stenosis, lumbar region without neurogenic claudication: Secondary | ICD-10-CM | POA: Insufficient documentation

## 2019-02-03 DIAGNOSIS — R293 Abnormal posture: Secondary | ICD-10-CM | POA: Diagnosis present

## 2019-02-03 DIAGNOSIS — M25652 Stiffness of left hip, not elsewhere classified: Secondary | ICD-10-CM | POA: Insufficient documentation

## 2019-02-03 DIAGNOSIS — M6281 Muscle weakness (generalized): Secondary | ICD-10-CM | POA: Insufficient documentation
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
## 2019-02-09 ENCOUNTER — Ambulatory Visit: Payer: BC Managed Care – PPO

## 2019-02-09 ENCOUNTER — Other Ambulatory Visit: Payer: Self-pay

## 2019-02-09 DIAGNOSIS — M6281 Muscle weakness (generalized): Secondary | ICD-10-CM

## 2019-02-09 DIAGNOSIS — R293 Abnormal posture: Secondary | ICD-10-CM

## 2019-02-09 DIAGNOSIS — M25652 Stiffness of left hip, not elsewhere classified: Secondary | ICD-10-CM

## 2019-02-09 DIAGNOSIS — M48061 Spinal stenosis, lumbar region without neurogenic claudication: Secondary | ICD-10-CM | POA: Diagnosis not present
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
## 2019-02-09 NOTE — Patient Instructions (Signed)
Knee to chest with opposite leg flat and off table  1-2x/day 20-30 sec, PPT x 12-x/day,  Quadratus RT/Lt stretch 20-30 sec 2-3 reps 2x/day,   10-15 , scissors x 10-15 reps , hamstring 20-30 sec x 2-3 RT and LT  2x/day Thoracic ext seated x 3-10- reps 5-10 sec  2x/day

## 2019-02-11 ENCOUNTER — Encounter: Payer: Self-pay | Admitting: Physical Therapy

## 2019-02-11 ENCOUNTER — Other Ambulatory Visit: Payer: Self-pay

## 2019-02-11 ENCOUNTER — Ambulatory Visit: Payer: BC Managed Care – PPO | Admitting: Physical Therapy

## 2019-02-11 DIAGNOSIS — M48061 Spinal stenosis, lumbar region without neurogenic claudication: Secondary | ICD-10-CM | POA: Diagnosis not present

## 2019-02-11 DIAGNOSIS — M6281 Muscle weakness (generalized): Secondary | ICD-10-CM

## 2019-02-11 DIAGNOSIS — M25652 Stiffness of left hip, not elsewhere classified: Secondary | ICD-10-CM

## 2019-02-11 DIAGNOSIS — R293 Abnormal posture: Secondary | ICD-10-CM
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
## 2019-02-16 ENCOUNTER — Other Ambulatory Visit: Payer: Self-pay

## 2019-02-16 ENCOUNTER — Ambulatory Visit: Payer: BC Managed Care – PPO | Admitting: Physical Therapy

## 2019-02-16 ENCOUNTER — Encounter: Payer: Self-pay | Admitting: Physical Therapy

## 2019-02-16 DIAGNOSIS — R293 Abnormal posture: Secondary | ICD-10-CM

## 2019-02-16 DIAGNOSIS — M25652 Stiffness of left hip, not elsewhere classified: Secondary | ICD-10-CM

## 2019-02-16 DIAGNOSIS — M48061 Spinal stenosis, lumbar region without neurogenic claudication: Secondary | ICD-10-CM

## 2019-02-16 DIAGNOSIS — M6281 Muscle weakness (generalized): Secondary | ICD-10-CM
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
## 2019-02-18 ENCOUNTER — Encounter: Payer: Self-pay | Admitting: Physical Therapy

## 2019-02-18 ENCOUNTER — Other Ambulatory Visit: Payer: Self-pay

## 2019-02-18 ENCOUNTER — Ambulatory Visit: Payer: BC Managed Care – PPO | Admitting: Physical Therapy

## 2019-02-18 DIAGNOSIS — M48061 Spinal stenosis, lumbar region without neurogenic claudication: Secondary | ICD-10-CM | POA: Diagnosis not present

## 2019-02-18 DIAGNOSIS — M25652 Stiffness of left hip, not elsewhere classified: Secondary | ICD-10-CM

## 2019-02-18 DIAGNOSIS — M6281 Muscle weakness (generalized): Secondary | ICD-10-CM

## 2019-02-18 DIAGNOSIS — R293 Abnormal posture: Secondary | ICD-10-CM
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
## 2019-02-22 ENCOUNTER — Other Ambulatory Visit: Payer: Self-pay | Admitting: Emergency Medicine

## 2019-02-22 DIAGNOSIS — I10 Essential (primary) hypertension: Secondary | ICD-10-CM

## 2019-02-23 ENCOUNTER — Ambulatory Visit: Payer: BC Managed Care – PPO | Admitting: Physical Therapy

## 2019-02-23 ENCOUNTER — Other Ambulatory Visit: Payer: Self-pay

## 2019-02-23 DIAGNOSIS — M25652 Stiffness of left hip, not elsewhere classified: Secondary | ICD-10-CM

## 2019-02-23 DIAGNOSIS — R293 Abnormal posture: Secondary | ICD-10-CM

## 2019-02-23 DIAGNOSIS — M48061 Spinal stenosis, lumbar region without neurogenic claudication: Secondary | ICD-10-CM

## 2019-02-23 DIAGNOSIS — M6281 Muscle weakness (generalized): Secondary | ICD-10-CM
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
## 2019-02-25 ENCOUNTER — Other Ambulatory Visit: Payer: Self-pay

## 2019-02-25 ENCOUNTER — Ambulatory Visit: Payer: BC Managed Care – PPO

## 2019-02-25 DIAGNOSIS — M48061 Spinal stenosis, lumbar region without neurogenic claudication: Secondary | ICD-10-CM

## 2019-02-25 DIAGNOSIS — M25652 Stiffness of left hip, not elsewhere classified: Secondary | ICD-10-CM

## 2019-02-25 DIAGNOSIS — M6281 Muscle weakness (generalized): Secondary | ICD-10-CM
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
## 2019-02-28 ENCOUNTER — Encounter: Payer: Self-pay | Admitting: Physical Therapy

## 2019-02-28 ENCOUNTER — Ambulatory Visit: Payer: BC Managed Care – PPO | Admitting: Physical Therapy

## 2019-02-28 ENCOUNTER — Other Ambulatory Visit: Payer: Self-pay

## 2019-02-28 DIAGNOSIS — M6281 Muscle weakness (generalized): Secondary | ICD-10-CM

## 2019-02-28 DIAGNOSIS — M48061 Spinal stenosis, lumbar region without neurogenic claudication: Secondary | ICD-10-CM | POA: Diagnosis not present

## 2019-02-28 DIAGNOSIS — M25652 Stiffness of left hip, not elsewhere classified: Secondary | ICD-10-CM

## 2019-02-28 DIAGNOSIS — R293 Abnormal posture: Secondary | ICD-10-CM
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
## 2019-03-02 ENCOUNTER — Other Ambulatory Visit: Payer: Self-pay

## 2019-03-02 ENCOUNTER — Ambulatory Visit: Payer: BC Managed Care – PPO | Attending: Physical Medicine and Rehabilitation

## 2019-03-02 DIAGNOSIS — M6281 Muscle weakness (generalized): Secondary | ICD-10-CM

## 2019-03-02 DIAGNOSIS — M25652 Stiffness of left hip, not elsewhere classified: Secondary | ICD-10-CM | POA: Diagnosis present

## 2019-03-02 DIAGNOSIS — R293 Abnormal posture: Secondary | ICD-10-CM | POA: Diagnosis present

## 2019-03-02 DIAGNOSIS — M48061 Spinal stenosis, lumbar region without neurogenic claudication: Secondary | ICD-10-CM | POA: Diagnosis not present

## 2019-03-02 NOTE — Patient Instructions (Signed)
Trunk Rotation    Stand with feet shoulder width apart, hands together, knees slightly bent. Rotate shoulders to one side to shoulder, pelvis held in neutral and facing forward. Return to shoulder and rotate to other side. Repeat sequence _15___ times per session. Do __5__ sessions per week.  Copyright  VHI. All rights reserved.  Shoulder Row: Sitting    Face anchor. Palms down, pull elbows back, squeezing shoulder blades together. Repeat __15-20   times per set. Do 1-2__ sets per session. Do 5__ sessions per week.             STAND Anchor Height: Chest  http://tub.exer.us/285   Copyright  VHI. All rights reserved.  SHOULDER: Extension (Band)    Start with arm slightly forward. Holding band, pull backward, past hip, keeping elbow straight. Do not swing arm. Hold ___ seconds. Use ________ band. 15-20___ reps per set, _1-2__ sets per day, _5__ days per week                   STAND  Copyright  VHI. All rights reserved.
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
## 2019-03-10 ENCOUNTER — Other Ambulatory Visit: Payer: Self-pay

## 2019-03-10 ENCOUNTER — Ambulatory Visit: Payer: BC Managed Care – PPO

## 2019-03-10 DIAGNOSIS — M6281 Muscle weakness (generalized): Secondary | ICD-10-CM

## 2019-03-10 DIAGNOSIS — M48061 Spinal stenosis, lumbar region without neurogenic claudication: Secondary | ICD-10-CM | POA: Diagnosis not present

## 2019-03-10 DIAGNOSIS — R293 Abnormal posture: Secondary | ICD-10-CM
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
## 2019-03-12 ENCOUNTER — Other Ambulatory Visit: Payer: Self-pay

## 2019-03-12 ENCOUNTER — Encounter (HOSPITAL_COMMUNITY): Payer: Self-pay | Admitting: *Deleted

## 2019-03-12 ENCOUNTER — Emergency Department (HOSPITAL_COMMUNITY)
Admission: EM | Admit: 2019-03-12 | Discharge: 2019-03-12 | Disposition: A | Discharge status: Home / Self Care | Payer: BC Managed Care – PPO | Attending: Emergency Medicine | Admitting: Emergency Medicine

## 2019-03-12 DIAGNOSIS — Z96652 Presence of left artificial knee joint: Secondary | ICD-10-CM | POA: Insufficient documentation

## 2019-03-12 DIAGNOSIS — Z79899 Other long term (current) drug therapy: Secondary | ICD-10-CM | POA: Diagnosis not present

## 2019-03-12 DIAGNOSIS — F419 Anxiety disorder, unspecified: Secondary | ICD-10-CM | POA: Diagnosis present

## 2019-03-12 DIAGNOSIS — Z87891 Personal history of nicotine dependence: Secondary | ICD-10-CM | POA: Insufficient documentation

## 2019-03-12 DIAGNOSIS — I10 Essential (primary) hypertension: Secondary | ICD-10-CM | POA: Diagnosis not present

## 2019-03-12 MED ORDER — ALPRAZOLAM 0.25 MG PO TABS: 0.2500 mg | ORAL_TABLET | Freq: Three times a day (TID) | ORAL | 0 refills | Status: DC | PRN

## 2019-03-12 NOTE — ED Triage Notes (Addendum)
Pt states he hasn't slept for about 48 hours, feels panic and unable to lay down, Wife padded in Nov and now lives along. Neg thoughts since Feb. Works about 70 hours a week. sciactia in left leg hurting at intervals

## 2019-03-12 NOTE — ED Notes (Signed)
ED Provider at bedside. 
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
## 2019-03-14 ENCOUNTER — Other Ambulatory Visit: Payer: Self-pay

## 2019-03-14 ENCOUNTER — Ambulatory Visit: Payer: BC Managed Care – PPO

## 2019-03-14 DIAGNOSIS — M48061 Spinal stenosis, lumbar region without neurogenic claudication: Secondary | ICD-10-CM

## 2019-03-14 DIAGNOSIS — M6281 Muscle weakness (generalized): Secondary | ICD-10-CM

## 2019-03-14 DIAGNOSIS — R293 Abnormal posture: Secondary | ICD-10-CM
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
## 2019-03-16 ENCOUNTER — Other Ambulatory Visit: Payer: Self-pay

## 2019-03-16 ENCOUNTER — Encounter: Payer: Self-pay | Admitting: Emergency Medicine

## 2019-03-16 ENCOUNTER — Inpatient Hospital Stay: Payer: BC Managed Care – PPO | Admitting: Emergency Medicine

## 2019-03-16 ENCOUNTER — Ambulatory Visit (INDEPENDENT_AMBULATORY_CARE_PROVIDER_SITE_OTHER): Payer: BC Managed Care – PPO | Admitting: Emergency Medicine

## 2019-03-16 VITALS — BP 157/82 | HR 76 | Temp 98.5°F | Resp 16 | Ht 71.0 in | Wt 259.0 lb

## 2019-03-16 DIAGNOSIS — F4321 Adjustment disorder with depressed mood: Secondary | ICD-10-CM | POA: Diagnosis not present

## 2019-03-16 DIAGNOSIS — F418 Other specified anxiety disorders: Secondary | ICD-10-CM

## 2019-03-16 MED ORDER — BUSPIRONE HCL 7.5 MG PO TABS: 7.5000 mg | ORAL_TABLET | Freq: Two times a day (BID) | ORAL | 1 refills | Status: DC

## 2019-03-16 NOTE — Patient Instructions (Addendum)
   If you have lab work done today you will be contacted with your lab results within the next 2 weeks.  If you have not heard from us then please contact us. The fastest way to get your results is to register for My Chart.   IF you received an x-ray today, you will receive an invoice from [facility]. Please contact [facility] at [phone] with questions or concerns regarding your invoice.   IF you received labwork today, you will receive an invoice from [facility]. Please contact [facility] at [phone] with questions or concerns regarding your invoice.   Our billing staff will not be able to assist you with questions regarding bills from these companies.  You will be contacted with the lab results as soon as they are available. The fastest way to get your results is to activate your My Chart account. Instructions are located on the last page of this paperwork. If you have not heard from us regarding the results in 2 weeks, please contact this office.     Living With Anxiety  After being diagnosed with an anxiety disorder, you may be relieved to know why you have felt or behaved a certain way. It is natural to also feel overwhelmed about the treatment ahead and what it will mean for your life. With care and support, you can manage this condition and recover from it. How to cope with anxiety Dealing with stress Stress is your body's reaction to life changes and events, both good and bad. Stress can last just a few hours or it can be ongoing. Stress can play a major role in anxiety, so it is important to learn both how to cope with stress and how to think about it differently. Talk with your health care provider or a counselor to learn more about stress reduction. He or she may suggest some stress reduction techniques, such as:  Music therapy. This can include creating or listening to music that you enjoy and that inspires you.  Mindfulness-based meditation. This  involves being aware of your normal breaths, rather than trying to control your breathing. It can be done while sitting or walking.  Centering prayer. This is a kind of meditation that involves focusing on a word, phrase, or sacred image that is meaningful to you and that brings you peace.  Deep breathing. To do this, expand your stomach and inhale slowly through your nose. Hold your breath for 3-5 seconds. Then exhale slowly, allowing your stomach muscles to relax.  Self-talk. This is a skill where you identify thought patterns that lead to anxiety reactions and correct those thoughts.  Muscle relaxation. This involves tensing muscles then relaxing them. Choose a stress reduction technique that fits your lifestyle and personality. Stress reduction techniques take time and practice. Set aside 5-15 minutes a day to do them. Therapists can offer training in these techniques. The training may be covered by some insurance plans. Other things you can do to manage stress include:  Keeping a stress diary. This can help you learn what triggers your stress and ways to control your response.  Thinking about how you respond to certain situations. You may not be able to control everything, but you can control your reaction.  Making time for activities that help you relax, and not feeling guilty about spending your time in this way. Therapy combined with coping and stress-reduction skills provides the best chance for successful treatment. Medicines Medicines can help ease symptoms. Medicines for anxiety include:    Anti-anxiety drugs.  Antidepressants.  Beta-blockers. Medicines may be used as the main treatment for anxiety disorder, along with therapy, or if other treatments are not working. Medicines should be prescribed by a health care provider. Relationships Relationships can play a big part in helping you recover. Try to spend more time connecting with trusted friends and family members. Consider  going to couples counseling, taking family education classes, or going to family therapy. Therapy can help you and others better understand the condition. How to recognize changes in your condition Everyone has a different response to treatment for anxiety. Recovery from anxiety happens when symptoms decrease and stop interfering with your daily activities at home or work. This may mean that you will start to:  Have better concentration and focus.  Sleep better.  Be less irritable.  Have more energy.  Have improved memory. It is important to recognize when your condition is getting worse. Contact your health care provider if your symptoms interfere with home or work and you do not feel like your condition is improving. Where to find help and support: You can get help and support from these sources:  Self-help groups.  Online and community organizations.  A trusted spiritual leader.  Couples counseling.  Family education classes.  Family therapy. Follow these instructions at home:  Eat a healthy diet that includes plenty of vegetables, fruits, whole grains, low-fat dairy products, and lean protein. Do not eat a lot of foods that are high in solid fats, added sugars, or salt.  Exercise. Most adults should do the following: ? Exercise for at least 150 minutes each week. The exercise should increase your heart rate and make you sweat (moderate-intensity exercise). ? Strengthening exercises at least twice a week.  Cut down on caffeine, tobacco, alcohol, and other potentially harmful substances.  Get the right amount and quality of sleep. Most adults need 7-9 hours of sleep each night.  Make choices that simplify your life.  Take over-the-counter and prescription medicines only as told by your health care provider.  Avoid caffeine, alcohol, and certain over-the-counter cold medicines. These may make you feel worse. Ask your pharmacist which medicines to avoid.  Keep all  follow-up visits as told by your health care provider. This is important. Questions to ask your health care provider  Would I benefit from therapy?  How often should I follow up with a health care provider?  How long do I need to take medicine?  Are there any long-term side effects of my medicine?  Are there any alternatives to taking medicine? Contact a health care provider if:  You have a hard time staying focused or finishing daily tasks.  You spend many hours a day feeling worried about everyday life.  You become exhausted by worry.  You start to have headaches, feel tense, or have nausea.  You urinate more than normal.  You have diarrhea. Get help right away if:  You have a racing heart and shortness of breath.  You have thoughts of hurting yourself or others. If you ever feel like you may hurt yourself or others, or have thoughts about taking your own life, get help right away. You can go to your nearest emergency department or call:  Your local emergency services (911 in the [place]  A suicide crisis helpline, such as the National Suicide Prevention Lifeline at 1-800-273-8255. This is open 24-hours a day. Summary  Taking steps to deal with stress can help calm you.  Medicines cannot cure anxiety disorders,   but they can help ease symptoms.  Family, friends, and partners can play a big part in helping you recover from an anxiety disorder. This information is not intended to replace advice given to you by your health care provider. Make sure you discuss any questions you have with your health care provider. Document Released: 08/12/2016 Document Revised: 07/31/2017 Document Reviewed: 08/12/2016 Elsevier Patient Education  2020 [employer]  
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
## 2019-03-18 ENCOUNTER — Emergency Department (HOSPITAL_COMMUNITY)
Admission: EM | Admit: 2019-03-18 | Discharge: 2019-03-18 | Disposition: A | Discharge status: Home / Self Care | Payer: BC Managed Care – PPO | Attending: Emergency Medicine | Admitting: Emergency Medicine

## 2019-03-18 ENCOUNTER — Other Ambulatory Visit: Payer: Self-pay

## 2019-03-18 ENCOUNTER — Encounter (HOSPITAL_COMMUNITY): Payer: Self-pay | Admitting: Emergency Medicine

## 2019-03-18 DIAGNOSIS — Z87891 Personal history of nicotine dependence: Secondary | ICD-10-CM | POA: Insufficient documentation

## 2019-03-18 DIAGNOSIS — Z880 Allergy status to penicillin: Secondary | ICD-10-CM | POA: Insufficient documentation

## 2019-03-18 DIAGNOSIS — G4700 Insomnia, unspecified: Secondary | ICD-10-CM | POA: Diagnosis present

## 2019-03-18 DIAGNOSIS — I10 Essential (primary) hypertension: Secondary | ICD-10-CM | POA: Insufficient documentation

## 2019-03-18 DIAGNOSIS — Z79899 Other long term (current) drug therapy: Secondary | ICD-10-CM | POA: Diagnosis not present

## 2019-03-18 MED ORDER — HYDROXYZINE HCL 50 MG/ML IM SOLN
50.0000 mg | Freq: Once | INTRAMUSCULAR | Status: AC
  Administered 2019-03-18: 50 mg via INTRAMUSCULAR
  Filled 2019-03-18: qty 1

## 2019-03-18 NOTE — ED Provider Notes (Signed)
[facility] DEPT Provider Note   CSN: [id number] Arrival date & time: 03/18/19  6237    History   Chief Complaint Chief Complaint  Patient presents with  . Insomnia    HPI Gregg Smith is a 73 y.o. male.     HPI   He presents for evaluation of persistent anxiety with trouble sleeping, despite treatment with Xanax, from the ED, and BuSpar, from his PCP.  Notes regarding these interactions are in this EMR.  Today the patient is very concerned that he has been unable to sleep for the last 48 hours.  He started taking buspirone, 2 days ago, as prescribed.  After being prescribed Xanax, 1 week ago, he took 1/2 tablet each day with partial improvement.  His PCP referred him to psychiatry, but that visit has not taken place, yet.  Patient is distraught, and wants something to help him sleep immediately.  He denies suicidal ideation or plan.  He denies fever, cough, weakness or dizziness.  He states he has been talking to his son on the phone, who advised him not to leave the emergency department until he get some sleep.  There are no other known modifying factors.  Past Medical History:  Diagnosis Date  . Allergy    seasonal   . Arthritis   . Barrett's esophagus   . Cataract   . Chicken pox   . Colon polyps    hyperplastic  . Diverticulosis   . GERD (gastroesophageal reflux disease)   . Hemorrhoids   . Hyperlipidemia   . Hypertension   . Leg cramps   . Measles   . Mumps   . Pneumonia   . Rhinitis   . Sleep apnea    dental device  . Snoring disorder 05/09/2013    Patient Active Problem List   Diagnosis Date Noted  . GERD (gastroesophageal reflux disease) 08/14/2017  . 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 OSA (obstructive sleep apnea) 03/22/2014  . Snoring disorder 05/09/2013  . Unspecified essential hypertension 02/11/2013  . Other and unspecified hyperlipidemia 02/11/2013  . Arthritis 02/11/2013    Past  Surgical History:  Procedure Laterality Date  . APPENDECTOMY  1957  . COLONOSCOPY    . JOINT REPLACEMENT    . TONSILLECTOMY    . [place]  . 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 UPPER GASTROINTESTINAL ENDOSCOPY          Home Medications    Prior to Admission medications   Medication Sig Start Date End Date Taking? Authorizing Provider  ALPRAZolam (XANAX) 0.25 MG tablet Take 1 tablet (0.25 mg total) by mouth 3 (three) times daily as needed for anxiety. 03/12/19  Yes Lacretia Leigh, MD  busPIRone (BUSPAR) 7.5 MG tablet Take 1 tablet (7.5 mg total) by mouth 2 (two) times daily. 03/16/19 04/15/19 Yes Sagardia, Ines Bloomer, MD  gabapentin (NEURONTIN) 100 MG capsule Take 100 mg by mouth 3 (three) times daily.   Yes [provider]  lansoprazole (PREVACID) 30 MG capsule Take 1 capsule (30 mg total) by mouth 2 (two) times daily before a meal. NEEDS VISIT FOR FURTHER REFILLS Patient taking differently: Take 30 mg by mouth 2 (two) times daily before a meal.  12/16/18  Yes Irene Shipper, MD  lisinopril-hydrochlorothiazide (ZESTORETIC) 10-12.5 MG tablet TAKE 1  TABLET BY MOUTH EVERY DAY Patient taking differently: Take 1 tablet by mouth daily.  02/22/19  Yes Sagardia, Ines Bloomer, MD  meloxicam (MOBIC) 15 MG tablet Take 15 mg by mouth daily.   Yes [provider]  rosuvastatin (CRESTOR) 10 MG tablet TAKE 1 TABLET BY MOUTH EVERY DAY Patient taking differently: Take 10 mg by mouth daily.  08/20/18  Yes Sagardia, Ines Bloomer, MD  sucralfate (CARAFATE) 1 g tablet AVOID MORNING DOSE, AND TAKE AT LUNCH, DINNER AND BEDTIME Patient taking differently: Take 1 g by mouth See admin instructions. Avoid morning dose, and take at lunch, dinner and bedtime. 08/20/18  Yes Sagardia, Ines Bloomer, MD  tamsulosin (FLOMAX) 0.4 MG CAPS capsule  TAKE 1 CAPSULE BY MOUTH DAILY AT NIGHT. Patient taking differently: Take 0.4 mg by mouth at bedtime.  08/20/18  Yes Sagardia, Ines Bloomer, MD  lansoprazole (PREVACID) 30 MG capsule Take one tab 30 minutes before breakfast every morning. Patient not taking: Reported on 03/18/2019 08/14/17   Tereasa Coop, PA-C  mupirocin ointment (BACTROBAN) 2 % Sig to affected area twice a day x 7 days. Patient not taking: Reported on 03/18/2019 10/29/18   Horald Pollen, MD    Family History Family History  Problem Relation Age of Onset  . Alzheimer's disease Mother   . Cancer Father   . Stomach cancer Father   . Aortic aneurysm Son   . Heart disease Son   . Cancer Maternal Grandmother   . Heart disease Maternal Grandfather   . Stomach cancer Paternal Grandfather   . Colon cancer Neg Hx   . Colon polyps Neg Hx   . Esophageal cancer Neg Hx   . Rectal cancer Neg Hx   . Pancreatic cancer Neg Hx   . Liver cancer Neg Hx     Social History Social History   Tobacco Use  . Smoking status: Former Smoker    Packs/day: 1.00    Years: 40.00    Pack years: 40.00    Types: Cigarettes    Quit date: 01/19/2007    Years since quitting: 12.1  . Smokeless tobacco: Never Used  Substance Use Topics  . Alcohol use: Yes    Alcohol/week: 3.0 standard drinks    Types: 3 Cans of beer per week    Comment: 2 beers weekly  . Drug use: No     Allergies   Penicillins   Review of Systems Review of Systems  All other systems reviewed and are negative.    Physical Exam Updated Vital Signs BP (!) 167/99   Pulse 72   Temp 98.2 F (36.8 C) (Oral)   Resp 18   SpO2 96%   Physical Exam Vitals signs and nursing note reviewed.  Constitutional:      General: He is not in acute distress.    Appearance: He is well-developed. He is obese. He is not ill-appearing, toxic-appearing or diaphoretic.  HENT:     Head: Normocephalic and atraumatic.     Right Ear: External ear normal.     Left Ear:  External ear normal.  Eyes:     Conjunctiva/sclera: Conjunctivae normal.     Pupils: Pupils are equal, round, and reactive to light.  Neck:     Musculoskeletal: Normal range of motion and neck supple.     Trachea: Phonation normal.  Cardiovascular:     Rate and Rhythm: Normal rate.  Pulmonary:     Effort: Pulmonary effort is normal. No respiratory distress.  Breath sounds: No stridor.  Abdominal:     General: There is no distension.  Musculoskeletal: Normal range of motion.  Skin:    General: Skin is warm and dry.  Neurological:     Mental Status: He is alert and oriented to person, place, and time.     Cranial Nerves: No cranial nerve deficit.     Sensory: No sensory deficit.     Motor: No abnormal muscle tone.     Coordination: Coordination normal.  Psychiatric:        Attention and Perception: He is attentive.        Mood and Affect: Mood is anxious. Mood is not depressed.        Speech: He is communicative.        Behavior: Behavior is not agitated, aggressive, hyperactive or combative.        Thought Content: Thought content normal. Thought content is not paranoid. Thought content does not include suicidal ideation. Thought content does not include suicidal plan.        Cognition and Memory: Cognition is not impaired. Memory is not impaired.        Judgment: Judgment is not impulsive or inappropriate.      ED Treatments / Results  Labs (all labs ordered are listed, but only abnormal results are displayed) Labs Reviewed - No data to display  EKG None  Radiology No results found.  Procedures Procedures (including critical care time)  Medications Ordered in ED Medications  hydrOXYzine (VISTARIL) injection 50 mg (50 mg Intramuscular Given 03/18/19 1110)     Initial Impression / Assessment and Plan / ED Course  I have reviewed the triage vital signs and the nursing notes.  Pertinent labs & imaging results that were available during my care of the patient  were reviewed by me and considered in my medical decision making (see chart for details).         Patient Vitals for the past 24 hrs:  BP Temp Temp src Pulse Resp SpO2  03/18/19 1030 (!) 167/99 - - 72 18 96 %  03/18/19 1011 (!) 162/93 - - 64 18 99 %  03/18/19 0900 (!) 151/88 - - 62 18 96 %  03/18/19 0845 (!) 151/88 - - 66 18 97 %  03/18/19 0814 (!) 153/93 98.2 F (36.8 C) Oral 65 18 97 %    At D/C Reevaluation with update and discussion. After initial assessment and treatment, an updated evaluation reveals he is comfortable. Findings discussed and questions answered. Daleen Bo   Medical Decision Making: Nonspecific insomnia. Doubt unstable psychiatric condition.   CRITICAL CARE- No Performed by: Daleen Bo  Nursing Notes Reviewed/ Care Coordinated Applicable Imaging Reviewed Interpretation of Laboratory Data incorporated into ED treatment  The patient appears reasonably screened and/or stabilized for discharge and I doubt any other medical condition or other [facility] requiring further screening, evaluation, or treatment in the ED at this time prior to discharge.  Plan: Home Medications- usual; Home Treatments- sleep ygine; return here if the recommended treatment, does not improve the symptoms; Recommended follow up- PCP prn   Final Clinical Impressions(s) / ED Diagnoses   Final diagnoses:  Insomnia, unspecified type    ED Discharge Orders    None       Daleen Bo, MD 03/18/19 1754

## 2019-03-18 NOTE — ED Triage Notes (Signed)
Reports had a anxiety attack a week or so ago and has trouble sleeping since.

## 2019-03-18 NOTE — Discharge Instructions (Addendum)
We gave you a shot of hydroxyzine, to help you sleep today.  It is okay to use Xanax, 1 hour before bedtime, to help you sleep.  Your BuSpar, should start working in the next few days to a week.  Return here, if needed, for problems.

## 2019-03-18 NOTE — ED Notes (Signed)
ED Provider at bedside. 

## 2019-03-18 NOTE — ED Notes (Signed)
An After Visit Summary was printed and given to the patient. Discharge instructions given and no further questions at this time. Pt states his wife is picking him up, pt educated not to drive due to meds could cause drowsiness.

## 2019-03-22 ENCOUNTER — Ambulatory Visit (INDEPENDENT_AMBULATORY_CARE_PROVIDER_SITE_OTHER): Payer: BC Managed Care – PPO | Admitting: Emergency Medicine

## 2019-03-22 ENCOUNTER — Encounter: Payer: Self-pay | Admitting: Emergency Medicine

## 2019-03-22 ENCOUNTER — Other Ambulatory Visit: Payer: Self-pay

## 2019-03-22 VITALS — BP 147/96 | HR 72 | Temp 98.3°F | Resp 16 | Ht 71.0 in | Wt 258.0 lb

## 2019-03-22 DIAGNOSIS — F4321 Adjustment disorder with depressed mood: Secondary | ICD-10-CM | POA: Diagnosis not present

## 2019-03-22 DIAGNOSIS — F418 Other specified anxiety disorders: Secondary | ICD-10-CM

## 2019-03-22 DIAGNOSIS — G4700 Insomnia, unspecified: Secondary | ICD-10-CM

## 2019-03-22 MED ORDER — TRAZODONE HCL 50 MG PO TABS: 25.0000 mg | ORAL_TABLET | Freq: Every evening | ORAL | 3 refills | Status: DC | PRN

## 2019-03-22 NOTE — Patient Instructions (Addendum)
If you have lab work done today you will be contacted with your lab results within the next 2 weeks.  If you have not heard from [place] then please contact us. The fastest way to get your results is to register for My Chart.   IF you received an x-ray today, you will receive an invoice from [facility]. Please contact [facility] at [phone] with questions or concerns regarding your invoice.   IF you received labwork today, you will receive an invoice from [facility]. Please contact [facility] at [phone] with questions or concerns regarding your invoice.   Our billing staff will not be able to assist you with questions regarding bills from these companies.  You will be contacted with the lab results as soon as they are available. The fastest way to get your results is to activate your My Chart account. Instructions are located on the last page of this paperwork. If you have not heard from [place] regarding the results in 2 weeks, please contact this office.      Insomnia Insomnia is a sleep disorder that makes it difficult to fall asleep or stay asleep. Insomnia can cause fatigue, low energy, difficulty concentrating, mood swings, and poor performance at work or school. There are three different ways to classify insomnia:  Difficulty falling asleep.  Difficulty staying asleep.  Waking up too early in the morning. Any type of insomnia can be long-term (chronic) or short-term (acute). Both are common. Short-term insomnia usually lasts for three months or less. Chronic insomnia occurs at least three times a week for longer than three months. What are the causes? Insomnia may be caused by another condition, situation, or substance, such as:  Anxiety.  Certain medicines.  Gastroesophageal reflux disease (GERD) or other gastrointestinal conditions.  Asthma or other breathing conditions.  Restless legs syndrome, sleep apnea, or other sleep disorders.  Chronic  pain.  Menopause.  Stroke.  Abuse of alcohol, tobacco, or illegal drugs.  Mental health conditions, such as depression.  Caffeine.  Neurological disorders, such as Alzheimer's disease.  An overactive thyroid (hyperthyroidism). Sometimes, the cause of insomnia may not be known. What increases the risk? Risk factors for insomnia include:  Gender. Women are affected more often than men.  Age. Insomnia is more common as you get older.  Stress.  Lack of exercise.  Irregular work schedule or working night shifts.  Traveling between different time zones.  Certain medical and mental health conditions. What are the signs or symptoms? If you have insomnia, the main symptom is having trouble falling asleep or having trouble staying asleep. This may lead to other symptoms, such as:  Feeling fatigued or having low energy.  Feeling nervous about going to sleep.  Not feeling rested in the morning.  Having trouble concentrating.  Feeling irritable, anxious, or depressed. How is this diagnosed? This condition may be diagnosed based on:  Your symptoms and medical history. Your health care provider may ask about: ? Your sleep habits. ? Any medical conditions you have. ? Your mental health.  A physical exam. How is this treated? Treatment for insomnia depends on the cause. Treatment may focus on treating an underlying condition that is causing insomnia. Treatment may also include:  Medicines to help you sleep.  Counseling or therapy.  Lifestyle adjustments to help you sleep better. Follow these instructions at home: Eating and drinking   Limit or avoid alcohol, caffeinated beverages, and cigarettes, especially close to bedtime. These can disrupt your sleep.  Do not eat a large meal or eat spicy foods right before bedtime. This can lead to digestive discomfort that can make it hard for you to sleep. Sleep habits   Keep a sleep diary to help you and your health care  provider figure out what could be causing your insomnia. Write down: ? When you sleep. ? When you wake up during the night. ? How well you sleep. ? How rested you feel the next day. ? Any side effects of medicines you are taking. ? What you eat and drink.  Make your bedroom a dark, comfortable place where it is easy to fall asleep. ? Put up shades or blackout curtains to block light from outside. ? Use a white noise machine to block noise. ? Keep the temperature cool.  Limit screen use before bedtime. This includes: ? Watching TV. ? Using your smartphone, tablet, or computer.  Stick to a routine that includes going to bed and waking up at the same times every day and night. This can help you fall asleep faster. Consider making a quiet activity, such as reading, part of your nighttime routine.  Try to avoid taking naps during the day so that you sleep better at night.  Get out of bed if you are still awake after 15 minutes of trying to sleep. Keep the lights down, but try reading or doing a quiet activity. When you feel sleepy, go back to bed. General instructions  Take over-the-counter and prescription medicines only as told by your health care provider.  Exercise regularly, as told by your health care provider. Avoid exercise starting several hours before bedtime.  Use relaxation techniques to manage stress. Ask your health care provider to suggest some techniques that may work well for you. These may include: ? Breathing exercises. ? Routines to release muscle tension. ? Visualizing peaceful scenes.  Make sure that you drive carefully. Avoid driving if you feel very sleepy.  Keep all follow-up visits as told by your health care provider. This is important. Contact a health care provider if:  You are tired throughout the day.  You have trouble in your daily routine due to sleepiness.  You continue to have sleep problems, or your sleep problems get worse. Get help right  away if:  You have serious thoughts about hurting yourself or someone else. If you ever feel like you may hurt yourself or others, or have thoughts about taking your own life, get help right away. You can go to your nearest emergency department or call:  Your local emergency services (911 in the [place]  A suicide crisis helpline, such as the [place] at 843-168-5892. This is open 24 hours a day. Summary  Insomnia is a sleep disorder that makes it difficult to fall asleep or stay asleep.  Insomnia can be long-term (chronic) or short-term (acute).  Treatment for insomnia depends on the cause. Treatment may focus on treating an underlying condition that is causing insomnia.  Keep a sleep diary to help you and your health care provider figure out what could be causing your insomnia. This information is not intended to replace advice given to you by your health care provider. Make sure you discuss any questions you have with your health care provider. Document Released: 08/15/2000 Document Revised: 07/31/2017 Document Reviewed: 05/28/2017 Elsevier Patient Education  2020 [employer].

## 2019-03-22 NOTE — Progress Notes (Signed)
[name] 73 y.o.   Chief Complaint  Patient presents with  . Anxiety    follow up/ trouble sleeping    HISTORY OF PRESENT ILLNESS: This is a 73 y.o. male seen by me last week with situational anxiety and grief reaction, started on BuSpar 7.5 mg twice a day, tolerating it well, however he has had difficulty sleeping and would like something to help him sleep.  No other complaints or medical concerns today.  Went to the emergency room last week and was given an injection of Vistaril which helped him sleep.  HPI   Prior to Admission medications   Medication Sig Start Date End Date Taking? Authorizing Provider  ALPRAZolam (XANAX) 0.25 MG tablet Take 1 tablet (0.25 mg total) by mouth 3 (three) times daily as needed for anxiety. 03/12/19  Yes Lacretia Leigh, MD  busPIRone (BUSPAR) 7.5 MG tablet Take 1 tablet (7.5 mg total) by mouth 2 (two) times daily. 03/16/19 04/15/19 Yes Vanden Fawaz, Ines Bloomer, MD  gabapentin (NEURONTIN) 100 MG capsule Take 100 mg by mouth 3 (three) times daily.   Yes [provider]  lansoprazole (PREVACID) 30 MG capsule Take 1 capsule (30 mg total) by mouth 2 (two) times daily before a meal. NEEDS VISIT FOR FURTHER REFILLS Patient taking differently: Take 30 mg by mouth 2 (two) times daily before a meal.  12/16/18  Yes Irene Shipper, MD  lisinopril-hydrochlorothiazide (ZESTORETIC) 10-12.5 MG tablet TAKE 1 TABLET BY MOUTH EVERY DAY Patient taking differently: Take 1 tablet by mouth daily.  02/22/19  Yes Imran Nuon, Ines Bloomer, MD  meloxicam (MOBIC) 15 MG tablet Take 15 mg by mouth daily.   Yes [provider]  rosuvastatin (CRESTOR) 10 MG tablet TAKE 1 TABLET BY MOUTH EVERY DAY Patient taking differently: Take 10 mg by mouth daily.  08/20/18  Yes Zarin Knupp, Ines Bloomer, MD  sucralfate (CARAFATE) 1 g tablet AVOID MORNING DOSE, AND TAKE AT LUNCH, DINNER AND BEDTIME Patient taking differently: Take 1 g by mouth See admin instructions. Avoid morning dose,  and take at lunch, dinner and bedtime. 08/20/18  Yes Jourdyn Hasler, Ines Bloomer, MD  tamsulosin (FLOMAX) 0.4 MG CAPS capsule TAKE 1 CAPSULE BY MOUTH DAILY AT NIGHT. Patient taking differently: Take 0.4 mg by mouth at bedtime.  08/20/18  Yes Averleigh Savary, Ines Bloomer, MD  lansoprazole (PREVACID) 30 MG capsule Take one tab 30 minutes before breakfast every morning. Patient not taking: Reported on 03/18/2019 08/14/17   Tereasa Coop, PA-C  mupirocin ointment (BACTROBAN) 2 % Sig to affected area twice a day x 7 days. Patient not taking: Reported on 03/18/2019 10/29/18   Horald Pollen, MD    Allergies  Allergen Reactions  . Penicillins Hives and Rash    Has patient had a PCN reaction causing immediate rash, facial/tongue/throat swelling, SOB or lightheadedness with hypotension:unsure Has patient had a PCN reaction causing severe rash involving mucus membranes or skin necrosis:unsure Has patient had a PCN reaction that required hospitalization:No Has patient had a PCN reaction occurring within the last 10 years:No If all of the above answers are "NO", then may proceed with Cephalosporin use. Childhood reaction     Patient Active Problem List   Diagnosis Date Noted  . GERD (gastroesophageal reflux disease) 08/14/2017  . 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 OSA (obstructive sleep apnea) 03/22/2014  . Snoring disorder 05/09/2013  . Unspecified essential hypertension 02/11/2013  . Other and unspecified hyperlipidemia 02/11/2013  . Arthritis 02/11/2013  Past Medical History:  Diagnosis Date  . Allergy    seasonal   . Arthritis   . Barrett's esophagus   . Cataract   . Chicken pox   . Colon polyps    hyperplastic  . Diverticulosis   . GERD (gastroesophageal reflux disease)   . Hemorrhoids   . Hyperlipidemia   . Hypertension   . Leg cramps   . Measles   . Mumps   . Pneumonia   . Rhinitis   . Sleep apnea    dental device  . Snoring disorder 05/09/2013     Past Surgical History:  Procedure Laterality Date  . APPENDECTOMY  1957  . COLONOSCOPY    . JOINT REPLACEMENT    . TONSILLECTOMY    . [place]  . 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 UPPER GASTROINTESTINAL ENDOSCOPY      Social History   Socioeconomic History  . Marital status: Widowed    Spouse name: [name]  . Number of children: 1  . Years of education: PHD  . Highest education level: Not on file  Occupational History    Comment: Professor,[employer] scheme manager    Employer: [employer]  Social Needs  . Financial resource strain: Not on file  . Food insecurity    Worry: Not on file    Inability: Not on file  . Transportation needs    Medical: Not on file    Non-medical: Not on file  Tobacco Use  . Smoking status: Former Smoker    Packs/day: 1.00    Years: 40.00    Pack years: 40.00    Types: Cigarettes    Quit date: 01/19/2007    Years since quitting: 12.1  . Smokeless tobacco: Never Used  Substance and Sexual Activity  . Alcohol use: Yes    Alcohol/week: 3.0 standard drinks    Types: 3 Cans of beer per week    Comment: 2 beers weekly  . Drug use: No  . Sexual activity: Yes    Birth control/protection: None  Lifestyle  . Physical activity    Days per week: Not on file    Minutes per session: Not on file  . Stress: Not on file  Relationships  . Social Herbalist on phone: Not on file    Gets together: Not on file    Attends religious service: Not on file    Active member of club or organization: Not on file    Attends meetings of clubs or organizations: Not on file    Relationship status: Not on file  . Intimate partner violence    Fear of current or ex partner: Not on file    Emotionally abused: Not on file    Physically abused: Not on file    Forced sexual activity:  Not on file  Other Topics Concern  . 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   Patient is working full-time.   Patient has a Ph.D   Patient has one adult child.   Patient is right-handed.   Patient drinks two cups of tea daily.    Family History  Problem Relation Age of Onset  . Alzheimer's disease Mother   . Cancer Father   . Stomach  cancer Father   . Aortic aneurysm Son   . Heart disease Son   . Cancer Maternal Grandmother   . Heart disease Maternal Grandfather   . Stomach cancer Paternal Grandfather   . Colon cancer Neg Hx   . Colon polyps Neg Hx   . Esophageal cancer Neg Hx   . Rectal cancer Neg Hx   . Pancreatic cancer Neg Hx   . Liver cancer Neg Hx      Review of Systems  Constitutional: Negative.  Negative for fever.  HENT: Negative.  Negative for congestion and sore throat.   Eyes: Negative.   Respiratory: Negative.  Negative for cough and shortness of breath.   Cardiovascular: Negative.  Negative for chest pain and palpitations.  Gastrointestinal: Negative for abdominal pain, blood in stool, diarrhea, nausea and vomiting.  Genitourinary: Negative.  Negative for dysuria and hematuria.  Skin: Negative.  Negative for rash.  Neurological: Negative.  Negative for dizziness.  Psychiatric/Behavioral: Positive for depression. The patient has insomnia.   All other systems reviewed and are negative.  Vitals:   03/22/19 1448  BP: (!) 147/96  Pulse: 72  Resp: 16  Temp: 98.3 F (36.8 C)  SpO2: 96%     Physical Exam Vitals signs reviewed.  Constitutional:      Appearance: Normal appearance.  HENT:     Head: Normocephalic and atraumatic.  Eyes:     Extraocular Movements: Extraocular movements intact.     Pupils: Pupils are equal, round, and reactive to light.  Neck:     Musculoskeletal: Normal range of motion and neck supple.  Cardiovascular:     Rate and Rhythm: Normal rate and regular rhythm.      Pulses: Normal pulses.     Heart sounds: Normal heart sounds.  Pulmonary:     Effort: Pulmonary effort is normal.     Breath sounds: Normal breath sounds.  Musculoskeletal: Normal range of motion.  Skin:    General: Skin is warm and dry.     Capillary Refill: Capillary refill takes less than 2 seconds.  Neurological:     General: No focal deficit present.     Mental Status: He is alert and oriented to person, place, and time.  Psychiatric:        Mood and Affect: Mood normal.        Behavior: Behavior normal.    A total of 25 minutes was spent in the room with the patient, greater than 50% of which was in counseling/coordination of care regarding insomnia, sleep hygiene, medication side effects, management of anxiety and depression, prognosis, and need for follow-up.   ASSESSMENT & PLAN: Wilford was seen today for anxiety.  Diagnoses and all orders for this visit:  Insomnia, unspecified type -     traZODone (DESYREL) 50 MG tablet; Take 0.5-1 tablets (25-50 mg total) by mouth at bedtime as needed for sleep.  Situational anxiety  Grief reaction    Patient Instructions       If you have lab work done today you will be contacted with your lab results within the next 2 weeks.  If you have not heard from [place] then please contact us. The fastest way to get your results is to register for My Chart.   IF you received an x-ray today, you will receive an invoice from [facility]. Please contact [facility] at [phone] with questions or concerns regarding your invoice.   IF you received labwork today, you will receive an invoice from  [facility]. Please contact [facility] at [phone] with questions or concerns regarding your invoice.   Our billing staff will not be able to assist you with questions regarding bills from these companies.  You will be contacted with the lab results as soon as they are available. The fastest way to get your results is to  activate your My Chart account. Instructions are located on the last page of this paperwork. If you have not heard from [place] regarding the results in 2 weeks, please contact this office.      Insomnia Insomnia is a sleep disorder that makes it difficult to fall asleep or stay asleep. Insomnia can cause fatigue, low energy, difficulty concentrating, mood swings, and poor performance at work or school. There are three different ways to classify insomnia:  Difficulty falling asleep.  Difficulty staying asleep.  Waking up too early in the morning. Any type of insomnia can be long-term (chronic) or short-term (acute). Both are common. Short-term insomnia usually lasts for three months or less. Chronic insomnia occurs at least three times a week for longer than three months. What are the causes? Insomnia may be caused by another condition, situation, or substance, such as:  Anxiety.  Certain medicines.  Gastroesophageal reflux disease (GERD) or other gastrointestinal conditions.  Asthma or other breathing conditions.  Restless legs syndrome, sleep apnea, or other sleep disorders.  Chronic pain.  Menopause.  Stroke.  Abuse of alcohol, tobacco, or illegal drugs.  Mental health conditions, such as depression.  Caffeine.  Neurological disorders, such as Alzheimer's disease.  An overactive thyroid (hyperthyroidism). Sometimes, the cause of insomnia may not be known. What increases the risk? Risk factors for insomnia include:  Gender. Women are affected more often than men.  Age. Insomnia is more common as you get older.  Stress.  Lack of exercise.  Irregular work schedule or working night shifts.  Traveling between different time zones.  Certain medical and mental health conditions. What are the signs or symptoms? If you have insomnia, the main symptom is having trouble falling asleep or having trouble staying asleep. This may lead to other symptoms, such as:   Feeling fatigued or having low energy.  Feeling nervous about going to sleep.  Not feeling rested in the morning.  Having trouble concentrating.  Feeling irritable, anxious, or depressed. How is this diagnosed? This condition may be diagnosed based on:  Your symptoms and medical history. Your health care provider may ask about: ? Your sleep habits. ? Any medical conditions you have. ? Your mental health.  A physical exam. How is this treated? Treatment for insomnia depends on the cause. Treatment may focus on treating an underlying condition that is causing insomnia. Treatment may also include:  Medicines to help you sleep.  Counseling or therapy.  Lifestyle adjustments to help you sleep better. Follow these instructions at home: Eating and drinking   Limit or avoid alcohol, caffeinated beverages, and cigarettes, especially close to bedtime. These can disrupt your sleep.  Do not eat a large meal or eat spicy foods right before bedtime. This can lead to digestive discomfort that can make it hard for you to sleep. Sleep habits   Keep a sleep diary to help you and your health care provider figure out what could be causing your insomnia. Write down: ? When you sleep. ? When you wake up during the night. ? How well you sleep. ? How rested you feel the next day. ? Any side effects of medicines you are taking. ?  What you eat and drink.  Make your bedroom a dark, comfortable place where it is easy to fall asleep. ? Put up shades or blackout curtains to block light from outside. ? Use a white noise machine to block noise. ? Keep the temperature cool.  Limit screen use before bedtime. This includes: ? Watching TV. ? Using your smartphone, tablet, or computer.  Stick to a routine that includes going to bed and waking up at the same times every day and night. This can help you fall asleep faster. Consider making a quiet activity, such as reading, part of your nighttime  routine.  Try to avoid taking naps during the day so that you sleep better at night.  Get out of bed if you are still awake after 15 minutes of trying to sleep. Keep the lights down, but try reading or doing a quiet activity. When you feel sleepy, go back to bed. General instructions  Take over-the-counter and prescription medicines only as told by your health care provider.  Exercise regularly, as told by your health care provider. Avoid exercise starting several hours before bedtime.  Use relaxation techniques to manage stress. Ask your health care provider to suggest some techniques that may work well for you. These may include: ? Breathing exercises. ? Routines to release muscle tension. ? Visualizing peaceful scenes.  Make sure that you drive carefully. Avoid driving if you feel very sleepy.  Keep all follow-up visits as told by your health care provider. This is important. Contact a health care provider if:  You are tired throughout the day.  You have trouble in your daily routine due to sleepiness.  You continue to have sleep problems, or your sleep problems get worse. Get help right away if:  You have serious thoughts about hurting yourself or someone else. If you ever feel like you may hurt yourself or others, or have thoughts about taking your own life, get help right away. You can go to your nearest emergency department or call:  Your local emergency services (911 in the [place]  A suicide crisis helpline, such as the [facility] at (830)506-4262. This is open 24 hours a day. Summary  Insomnia is a sleep disorder that makes it difficult to fall asleep or stay asleep.  Insomnia can be long-term (chronic) or short-term (acute).  Treatment for insomnia depends on the cause. Treatment may focus on treating an underlying condition that is causing insomnia.  Keep a sleep diary to help you and your health care provider figure out what could be  causing your insomnia. This information is not intended to replace advice given to you by your health care provider. Make sure you discuss any questions you have with your health care provider. Document Released: 08/15/2000 Document Revised: 07/31/2017 Document Reviewed: 05/28/2017 Elsevier Patient Education  2020 Elsevier Inc.      Agustina Caroli, MD [facility]

## 2019-03-25 ENCOUNTER — Encounter: Payer: Self-pay | Admitting: Emergency Medicine

## 2019-03-25 ENCOUNTER — Telehealth: Payer: Self-pay | Admitting: Internal Medicine

## 2019-03-25 DIAGNOSIS — K219 Gastro-esophageal reflux disease without esophagitis: Secondary | ICD-10-CM

## 2019-03-25 DIAGNOSIS — K2270 Barrett's esophagus without dysplasia: Secondary | ICD-10-CM

## 2019-03-25 MED ORDER — LANSOPRAZOLE 30 MG PO CPDR: 30.0000 mg | DELAYED_RELEASE_CAPSULE | Freq: Two times a day (BID) | ORAL | 0 refills | Status: DC

## 2019-03-25 NOTE — Telephone Encounter (Signed)
Patient would like a refill for lansoprazole (PREVACID) 30 MG

## 2019-03-25 NOTE — Telephone Encounter (Signed)
Refilled Prevacid but patient must have an office visit for any more

## 2019-03-28 ENCOUNTER — Telehealth: Payer: Self-pay | Admitting: Emergency Medicine

## 2019-03-28 NOTE — Telephone Encounter (Signed)
Pt called in to follow up about [facility] referral. Pt says that he called outpt [facility] and was advised that referral was denied due to no one taking pt's. Pt was advised to call PCP to request a referral to a different location.    Please assist.

## 2019-03-29 ENCOUNTER — Ambulatory Visit: Payer: BC Managed Care – PPO | Admitting: *Deleted

## 2019-03-29 ENCOUNTER — Other Ambulatory Visit: Payer: Self-pay

## 2019-03-29 VITALS — Ht 71.0 in | Wt 257.0 lb

## 2019-03-29 DIAGNOSIS — K22710 Barrett's esophagus with low grade dysplasia: Secondary | ICD-10-CM

## 2019-03-29 NOTE — Progress Notes (Signed)
Patient's pre-visit was done today over the phone with the patient due to COVID-19 pandemic. Name,DOB and address verified. Insurance verified. Packet of Prep instructions mailed to patient including copy of a consent form and pre-procedure patient acknowledgement form-pt is aware.  Patient understands to call us back with any questions or concerns.  Patient denies any allergies to eggs or soy. Patient denies any problems with anesthesia/sedation. Patient denies any oxygen use at home. Patient denies taking any diet/weight loss medications or blood thinners. Pt is aware that care partner will wait in the car during procedure; if they feel like they will be too hot to wait in the car; they may wait in the lobby.  We want them to wear a mask (we do not have any that we can provide them), practice social distancing, and we will check their temperatures when they get here.  I did remind patient that their care partner needs to stay in the parking lot the entire time. Pt will wear mask into building. 

## 2019-03-30 NOTE — Telephone Encounter (Signed)
That is a good question for the referral center.  I would not happen to know.  Thanks.

## 2019-03-30 NOTE — Telephone Encounter (Signed)
I have spoken to [facility] and we have sent a referral to [facility]. I have called the pt and informed him of this and he stated understanding.

## 2019-03-30 NOTE — Telephone Encounter (Signed)
Please advise if there is any other location that this pt can be referred to for behavior health since his last referral was denied.

## 2019-04-04 ENCOUNTER — Other Ambulatory Visit: Payer: Self-pay

## 2019-04-04 ENCOUNTER — Telehealth (INDEPENDENT_AMBULATORY_CARE_PROVIDER_SITE_OTHER): Payer: BC Managed Care – PPO | Admitting: Emergency Medicine

## 2019-04-04 ENCOUNTER — Encounter: Payer: Self-pay | Admitting: Emergency Medicine

## 2019-04-04 VITALS — Wt 258.0 lb

## 2019-04-04 DIAGNOSIS — G4700 Insomnia, unspecified: Secondary | ICD-10-CM

## 2019-04-04 DIAGNOSIS — F418 Other specified anxiety disorders: Secondary | ICD-10-CM

## 2019-04-04 MED ORDER — ZOLPIDEM TARTRATE 5 MG PO TABS: 5.0000 mg | ORAL_TABLET | Freq: Every evening | ORAL | 0 refills | Status: DC | PRN

## 2019-04-04 NOTE — Progress Notes (Signed)
Called patient to triage for appointment. Patient is following up on insomnia. Patient states insomnia for 3 weeks on Thursday and Friday night he did not get any sleep.

## 2019-04-04 NOTE — Progress Notes (Signed)
Telemedicine Encounter- SOAP NOTE Established Patient  This telephone encounter was conducted with the patient's (or proxy's) verbal consent via audio telecommunications: yes/no: Yes Patient was instructed to have this encounter in a suitably private space; and to only have persons present to whom they give permission to participate. In addition, patient identity was confirmed by use of name plus two identifiers (DOB and address).  I discussed the limitations, risks, security and privacy concerns of performing an evaluation and management service by telephone and the availability of in person appointments. I also discussed with the patient that there may be a patient responsible charge related to this service. The patient expressed understanding and agreed to proceed.   No chief complaint on file. insomnia   Subjective   Gregg Smith is a 73 y.o. male established patient. Telephone visit today for follow-up of situational anxiety and insomnia.  Was started on BuSpar 7.5 mg twice a day.  Anxiety better but still not controlled.  Having insomnia.  Was started on trazodone 50 mg but not working well.  Referred to psychiatry and [facility] called for paperwork but no appointment scheduled yet.   HPI   Patient Active Problem List   Diagnosis Date Noted  . GERD (gastroesophageal reflux disease) 08/14/2017  . 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 OSA (obstructive sleep apnea) 03/22/2014  . Snoring disorder 05/09/2013  . Unspecified essential hypertension 02/11/2013  . Other and unspecified hyperlipidemia 02/11/2013  . Arthritis 02/11/2013    Past Medical History:  Diagnosis Date  . Allergy    seasonal   . Anxiety   . Arthritis   . Barrett's esophagus   . Cataract   . Chicken pox   . Colon polyps    hyperplastic  . Diverticulosis   . GERD (gastroesophageal reflux disease)   . Hemorrhoids   . Hyperlipidemia   . Hypertension   . Leg cramps   .  Measles   . Mumps   . Pneumonia   . Rhinitis   . Sleep apnea    dental device  . Snoring disorder 05/09/2013    Current Outpatient Medications  Medication Sig Dispense Refill  . busPIRone (BUSPAR) 7.5 MG tablet Take 1 tablet (7.5 mg total) by mouth 2 (two) times daily. 60 tablet 1  . gabapentin (NEURONTIN) 100 MG capsule Take 100 mg by mouth 3 (three) times daily.    . lansoprazole (PREVACID) 30 MG capsule Take 1 capsule (30 mg total) by mouth 2 (two) times daily before a meal. NEEDS VISIT FOR FURTHER REFILLS!!!!!! 180 capsule 0  . lisinopril-hydrochlorothiazide (ZESTORETIC) 10-12.5 MG tablet TAKE 1 TABLET BY MOUTH EVERY DAY (Patient taking differently: Take 1 tablet by mouth daily. ) 90 tablet 1  . meloxicam (MOBIC) 15 MG tablet Take 15 mg by mouth daily.    . Multiple Vitamin (MULTIVITAMIN) tablet Take 1 tablet by mouth daily.    . rosuvastatin (CRESTOR) 10 MG tablet TAKE 1 TABLET BY MOUTH EVERY DAY (Patient taking differently: Take 10 mg by mouth daily. ) 90 tablet 3  . sucralfate (CARAFATE) 1 g tablet AVOID MORNING DOSE, AND TAKE AT LUNCH, DINNER AND BEDTIME (Patient taking differently: Take 1 g by mouth See admin instructions. Avoid morning dose, and take at lunch, dinner and bedtime.) 270 tablet 3  . tamsulosin (FLOMAX) 0.4 MG CAPS capsule TAKE 1 CAPSULE BY MOUTH DAILY AT NIGHT. (Patient taking differently: Take 0.4 mg by mouth at bedtime. ) 90 capsule  3  . traZODone (DESYREL) 50 MG tablet Take 0.5-1 tablets (25-50 mg total) by mouth at bedtime as needed for sleep. 30 tablet 3  . ALPRAZolam (XANAX) 0.25 MG tablet Take 1 tablet (0.25 mg total) by mouth 3 (three) times daily as needed for anxiety. (Patient not taking: Reported on 04/04/2019) 8 tablet 0   No current facility-administered medications for this visit.     Allergies  Allergen Reactions  . Penicillins Hives and Rash    Has patient had a PCN reaction causing immediate rash, facial/tongue/throat swelling, SOB or lightheadedness  with hypotension:unsure Has patient had a PCN reaction causing severe rash involving mucus membranes or skin necrosis:unsure Has patient had a PCN reaction that required hospitalization:No Has patient had a PCN reaction occurring within the last 10 years:No If all of the above answers are "NO", then may proceed with Cephalosporin use. Childhood reaction     Social History   Socioeconomic History  . Marital status: Widowed    Spouse name: [name]  . Number of children: 1  . Years of education: PHD  . Highest education level: Not on file  Occupational History    Comment: Professor,[employer] scheme manager    Employer: [employer]  Social Needs  . Financial resource strain: Not on file  . Food insecurity    Worry: Not on file    Inability: Not on file  . Transportation needs    Medical: Not on file    Non-medical: Not on file  Tobacco Use  . Smoking status: Former Smoker    Packs/day: 1.00    Years: 40.00    Pack years: 40.00    Types: Cigarettes    Quit date: 01/19/2007    Years since quitting: 12.2  . Smokeless tobacco: Never Used  Substance and Sexual Activity  . Alcohol use: Yes    Alcohol/week: 4.0 standard drinks    Types: 4 Cans of beer per week  . Drug use: No  . Sexual activity: Yes    Birth control/protection: None  Lifestyle  . Physical activity    Days per week: Not on file    Minutes per session: Not on file  . Stress: Not on file  Relationships  . Social Herbalist on phone: Not on file    Gets together: Not on file    Attends religious service: Not on file    Active member of club or organization: Not on file    Attends meetings of clubs or organizations: Not on file    Relationship status: Not on file  . Intimate partner violence    Fear of current or ex partner: Not on file    Emotionally abused: Not on file    Physically abused: Not on file    Forced sexual activity: Not on file  Other Topics Concern  . 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   Patient is working full-time.   Patient has a Ph.D   Patient has one adult child.   Patient is right-handed.   Patient drinks two cups of tea daily.    Review of Systems  Constitutional: Negative.  Negative for chills and fever.  HENT: Negative.   Eyes: Negative.   Respiratory: Negative.  Negative for cough and shortness of breath.   Cardiovascular: Negative.  Negative for chest pain and palpitations.  Gastrointestinal: Negative.  Negative for abdominal  pain, diarrhea, nausea and vomiting.  Genitourinary: Negative.   Skin: Negative.   Neurological: Negative for dizziness and headaches.  Psychiatric/Behavioral: The patient is nervous/anxious and has insomnia.   All other systems reviewed and are negative.   Objective  Alert and oriented x3 in no apparent respiratory distress Vitals as reported by the patient: Today's Vitals   04/04/19 0753  Weight: 258 lb (117 kg)    Diagnoses and all orders for this visit:  Insomnia, unspecified type -     zolpidem (AMBIEN) 5 MG tablet; Take 1 tablet (5 mg total) by mouth at bedtime as needed for sleep.  Situational anxiety    Will increase BuSpar to 22.5 mg/day. Stop trazodone and start Ambien 5 mg. Waiting on psychiatry appointment. Follow-up in 1 week.  I discussed the assessment and treatment plan with the patient. The patient was provided an opportunity to ask questions and all were answered. The patient agreed with the plan and demonstrated an understanding of the instructions.   The patient was advised to call back or seek an in-person evaluation if the symptoms worsen or if the condition fails to improve as anticipated.    Horald Pollen, MD  Primary Care at [facility]

## 2019-04-07 ENCOUNTER — Other Ambulatory Visit: Payer: Self-pay

## 2019-04-07 ENCOUNTER — Telehealth (INDEPENDENT_AMBULATORY_CARE_PROVIDER_SITE_OTHER): Payer: BC Managed Care – PPO | Admitting: Emergency Medicine

## 2019-04-07 ENCOUNTER — Encounter: Payer: Self-pay | Admitting: Emergency Medicine

## 2019-04-07 ENCOUNTER — Other Ambulatory Visit: Payer: Self-pay | Admitting: Emergency Medicine

## 2019-04-07 VITALS — Wt 258.0 lb

## 2019-04-07 DIAGNOSIS — G4700 Insomnia, unspecified: Secondary | ICD-10-CM

## 2019-04-07 DIAGNOSIS — F418 Other specified anxiety disorders: Secondary | ICD-10-CM

## 2019-04-07 DIAGNOSIS — F4323 Adjustment disorder with mixed anxiety and depressed mood: Secondary | ICD-10-CM | POA: Diagnosis not present

## 2019-04-07 NOTE — Progress Notes (Signed)
Called patient to triage for appointment. Patient states he is following up on the medication given to him for insomnia.

## 2019-04-07 NOTE — Progress Notes (Signed)
Telemedicine Encounter- SOAP NOTE Established Patient  This telephone encounter was conducted with the patient's (or proxy's) verbal consent via audio telecommunications: yes/no: Yes Patient was instructed to have this encounter in a suitably private space; and to only have persons present to whom they give permission to participate. In addition, patient identity was confirmed by use of name plus two identifiers (DOB and address).  I discussed the limitations, risks, security and privacy concerns of performing an evaluation and management service by telephone and the availability of in person appointments. I also discussed with the patient that there may be a patient responsible charge related to this service. The patient expressed understanding and agreed to proceed.    No chief complaint on file. Follow-up on insomnia  Subjective   Gregg Smith is a 73 y.o. male established patient. Telephone visit today for follow-up from visit 04/04/2019.  Started on Ambien 5 mg at bedtime for insomnia.  Patient has depression/anxiety.  Was referred to psychiatry several weeks ago but nothing scheduled yet.  States Ambien only helps for about 4 hours.  Still not getting adequate sleep.  No new symptoms.  HPI   Patient Active Problem List   Diagnosis Date Noted  . GERD (gastroesophageal reflux disease) 08/14/2017  . 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 OSA (obstructive sleep apnea) 03/22/2014  . Snoring disorder 05/09/2013  . Unspecified essential hypertension 02/11/2013  . Other and unspecified hyperlipidemia 02/11/2013  . Arthritis 02/11/2013    Past Medical History:  Diagnosis Date  . Allergy    seasonal   . Anxiety   . Arthritis   . Barrett's esophagus   . Cataract   . Chicken pox   . Colon polyps    hyperplastic  . Diverticulosis   . GERD (gastroesophageal reflux disease)   . Hemorrhoids   . Hyperlipidemia   . Hypertension   . Leg cramps   .  Measles   . Mumps   . Pneumonia   . Rhinitis   . Sleep apnea    dental device  . Snoring disorder 05/09/2013    Current Outpatient Medications  Medication Sig Dispense Refill  . ALPRAZolam (XANAX) 0.25 MG tablet Take 1 tablet (0.25 mg total) by mouth 3 (three) times daily as needed for anxiety. (Patient not taking: Reported on 04/04/2019) 8 tablet 0  . busPIRone (BUSPAR) 7.5 MG tablet Take 1 tablet (7.5 mg total) by mouth 2 (two) times daily. 60 tablet 1  . gabapentin (NEURONTIN) 100 MG capsule Take 100 mg by mouth 3 (three) times daily.    . lansoprazole (PREVACID) 30 MG capsule Take 1 capsule (30 mg total) by mouth 2 (two) times daily before a meal. NEEDS VISIT FOR FURTHER REFILLS!!!!!! 180 capsule 0  . lisinopril-hydrochlorothiazide (ZESTORETIC) 10-12.5 MG tablet TAKE 1 TABLET BY MOUTH EVERY DAY (Patient taking differently: Take 1 tablet by mouth daily. ) 90 tablet 1  . meloxicam (MOBIC) 15 MG tablet Take 15 mg by mouth daily.    . Multiple Vitamin (MULTIVITAMIN) tablet Take 1 tablet by mouth daily.    . rosuvastatin (CRESTOR) 10 MG tablet TAKE 1 TABLET BY MOUTH EVERY DAY (Patient taking differently: Take 10 mg by mouth daily. ) 90 tablet 3  . sucralfate (CARAFATE) 1 g tablet AVOID MORNING DOSE, AND TAKE AT LUNCH, DINNER AND BEDTIME (Patient taking differently: Take 1 g by mouth See admin instructions. Avoid morning dose, and take at lunch, dinner and bedtime.) 270 tablet  3  . tamsulosin (FLOMAX) 0.4 MG CAPS capsule TAKE 1 CAPSULE BY MOUTH DAILY AT NIGHT. (Patient taking differently: Take 0.4 mg by mouth at bedtime. ) 90 capsule 3  . zolpidem (AMBIEN) 5 MG tablet Take 1 tablet (5 mg total) by mouth at bedtime as needed for sleep. 30 tablet 0   No current facility-administered medications for this visit.     Allergies  Allergen Reactions  . Penicillins Hives and Rash    Has patient had a PCN reaction causing immediate rash, facial/tongue/throat swelling, SOB or lightheadedness with  hypotension:unsure Has patient had a PCN reaction causing severe rash involving mucus membranes or skin necrosis:unsure Has patient had a PCN reaction that required hospitalization:No Has patient had a PCN reaction occurring within the last 10 years:No If all of the above answers are "NO", then may proceed with Cephalosporin use. Childhood reaction     Social History   Socioeconomic History  . Marital status: Widowed    Spouse name: [name]  . Number of children: 1  . Years of education: PHD  . 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 Financial resource strain: Not on file  . Food insecurity    Worry: Not on file    Inability: Not on file  . Transportation needs    Medical: Not on file    Non-medical: Not on file  Tobacco Use  . Smoking status: Former Smoker    Packs/day: 1.00    Years: 40.00    Pack years: 40.00    Types: Cigarettes    Quit date: 01/19/2007    Years since quitting: 12.2  . Smokeless tobacco: Never Used  Substance and Sexual Activity  . Alcohol use: Yes    Alcohol/week: 4.0 standard drinks    Types: 4 Cans of beer per week  . Drug use: No  . Sexual activity: Yes    Birth control/protection: None  Lifestyle  . Physical activity    Days per week: Not on file    Minutes per session: Not on file  . Stress: Not on file  Relationships  . Social Herbalist on phone: Not on file    Gets together: Not on file    Attends religious service: Not on file    Active member of club or organization: Not on file    Attends meetings of clubs or organizations: Not on file    Relationship status: Not on file  . Intimate partner violence    Fear of current or ex partner: Not on file    Emotionally abused: Not on file    Physically abused: Not on file    Forced sexual activity: Not on file  Other Topics Concern  . 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   Patient is working full-time.   Patient has a Ph.D   Patient has one adult child.   Patient is right-handed.   Patient drinks two cups of tea daily.    Review of Systems  Constitutional: Negative.  Negative for chills and fever.  HENT: Negative for congestion and sore throat.   Respiratory: Negative.  Negative for cough and shortness of breath.   Cardiovascular: Negative for chest pain and palpitations.  Gastrointestinal: Negative for abdominal pain, diarrhea, nausea and vomiting.  Musculoskeletal: Negative for myalgias.  Skin: Negative.  Negative for rash.  Psychiatric/Behavioral: Positive for depression. Negative for suicidal ideas. The patient is nervous/anxious and has insomnia.   All other systems reviewed and are negative.   Objective   Vitals as reported by the patient: Today's Vitals   04/07/19 1236  Weight: 258 lb (117 kg)  Alert and oriented x3 in no apparent respiratory distress.  There are no diagnoses linked to this encounter. Diagnoses and all orders for this visit:  Insomnia, unspecified type  Adjustment reaction with anxiety and depression    Advised to increase Ambien dose to 10 mg at bedtime.  We will follow-up on psychiatric referral. Telemedicine visit in 1 week.  ED precautions given.  I discussed the assessment and treatment plan with the patient. The patient was provided an opportunity to ask questions and all were answered. The patient agreed with the plan and demonstrated an understanding of the instructions.   The patient was advised to call back or seek an in-person evaluation if the symptoms worsen or if the condition fails to improve as anticipated.    Horald Pollen, MD  Primary Care at [facility]

## 2019-04-07 NOTE — Telephone Encounter (Signed)
Ok to give #[phone] 

## 2019-04-11 ENCOUNTER — Inpatient Hospital Stay (HOSPITAL_COMMUNITY)
Admission: RE | Admit: 2019-04-11 | Discharge: 2019-04-16 | DRG: 885 | Disposition: A | Discharge status: Home / Self Care | Payer: BC Managed Care – PPO | Attending: Psychiatry | Admitting: Psychiatry

## 2019-04-11 DIAGNOSIS — G4700 Insomnia, unspecified: Secondary | ICD-10-CM

## 2019-04-11 DIAGNOSIS — K219 Gastro-esophageal reflux disease without esophagitis: Secondary | ICD-10-CM | POA: Diagnosis present

## 2019-04-11 DIAGNOSIS — Z87891 Personal history of nicotine dependence: Secondary | ICD-10-CM | POA: Diagnosis not present

## 2019-04-11 DIAGNOSIS — I10 Essential (primary) hypertension: Secondary | ICD-10-CM | POA: Diagnosis present

## 2019-04-11 DIAGNOSIS — F411 Generalized anxiety disorder: Secondary | ICD-10-CM

## 2019-04-11 DIAGNOSIS — N400 Enlarged prostate without lower urinary tract symptoms: Secondary | ICD-10-CM | POA: Diagnosis present

## 2019-04-11 DIAGNOSIS — F410 Panic disorder [episodic paroxysmal anxiety] without agoraphobia: Secondary | ICD-10-CM

## 2019-04-11 DIAGNOSIS — F322 Major depressive disorder, single episode, severe without psychotic features: Principal | ICD-10-CM | POA: Diagnosis present

## 2019-04-11 DIAGNOSIS — K269 Duodenal ulcer, unspecified as acute or chronic, without hemorrhage or perforation: Secondary | ICD-10-CM | POA: Diagnosis present

## 2019-04-11 DIAGNOSIS — Z20828 Contact with and (suspected) exposure to other viral communicable diseases: Secondary | ICD-10-CM | POA: Diagnosis present

## 2019-04-11 DIAGNOSIS — M5430 Sciatica, unspecified side: Secondary | ICD-10-CM | POA: Diagnosis present

## 2019-04-11 LAB — RAPID URINE DRUG SCREEN, HOSP PERFORMED
Amphetamines: NOT DETECTED
Barbiturates: NOT DETECTED
Benzodiazepines: NOT DETECTED
Cocaine: NOT DETECTED
Opiates: NOT DETECTED
Tetrahydrocannabinol: NOT DETECTED

## 2019-04-11 LAB — URINALYSIS, COMPLETE (UACMP) WITH MICROSCOPIC
Bacteria, UA: NONE SEEN
Bilirubin Urine: NEGATIVE
Glucose, UA: NEGATIVE mg/dL
Hgb urine dipstick: NEGATIVE
Ketones, ur: NEGATIVE mg/dL
Leukocytes,Ua: NEGATIVE
Nitrite: NEGATIVE
Protein, ur: NEGATIVE mg/dL
Specific Gravity, Urine: 1.01 (ref 1.005–1.030)
pH: 5 (ref 5.0–8.0)

## 2019-04-11 LAB — COMPREHENSIVE METABOLIC PANEL WITH GFR
ALT: 29 U/L (ref 0–44)
AST: 35 U/L (ref 15–41)
Albumin: 4.1 g/dL (ref 3.5–5.0)
Alkaline Phosphatase: 75 U/L (ref 38–126)
Anion gap: 9 (ref 5–15)
BUN: 20 mg/dL (ref 8–23)
CO2: 22 mmol/L (ref 22–32)
Calcium: 8.7 mg/dL — ABNORMAL LOW (ref 8.9–10.3)
Chloride: 106 mmol/L (ref 98–111)
Creatinine, Ser: 0.98 mg/dL (ref 0.61–1.24)
GFR calc Af Amer: >60 mL/min
GFR calc non Af Amer: >60 mL/min
Glucose, Bld: 125 mg/dL — ABNORMAL HIGH (ref 70–99)
Potassium: 3.2 mmol/L — ABNORMAL LOW (ref 3.5–5.1)
Sodium: 137 mmol/L (ref 135–145)
Total Bilirubin: 0.8 mg/dL (ref 0.3–1.2)
Total Protein: 7.4 g/dL (ref 6.5–8.1)

## 2019-04-11 LAB — CBC
HCT: 45.9 % (ref 39.0–52.0)
Hemoglobin: 15.1 g/dL (ref 13.0–17.0)
MCH: 28.5 pg (ref 26.0–34.0)
MCHC: 32.9 g/dL (ref 30.0–36.0)
MCV: 86.6 fL (ref 80.0–100.0)
Platelets: 221 K/uL (ref 150–400)
RBC: 5.3 MIL/uL (ref 4.22–5.81)
RDW: 14.6 % (ref 11.5–15.5)
WBC: 8.7 K/uL (ref 4.0–10.5)
nRBC: 0 % (ref 0.0–0.2)

## 2019-04-11 LAB — LIPID PANEL
Cholesterol: 155 mg/dL (ref 0–200)
HDL: 39 mg/dL — ABNORMAL LOW
LDL Cholesterol: 93 mg/dL (ref 0–99)
Total CHOL/HDL Ratio: 4 ratio
Triglycerides: 114 mg/dL
VLDL: 23 mg/dL (ref 0–40)

## 2019-04-11 MED ORDER — LISINOPRIL 10 MG PO TABS
10.0000 mg | ORAL_TABLET | Freq: Every day | ORAL | Status: DC
  Administered 2019-04-11: 10 mg via ORAL
  Filled 2019-04-11: qty 1
  Filled 2019-04-11: qty 2
  Filled 2019-04-11 (×3): qty 1

## 2019-04-11 MED ORDER — GABAPENTIN 300 MG PO CAPS
300.0000 mg | ORAL_CAPSULE | Freq: Two times a day (BID) | ORAL | Status: DC
  Administered 2019-04-11: 23:00:00 300 mg via ORAL
  Filled 2019-04-11 (×7): qty 1

## 2019-04-11 MED ORDER — MELOXICAM 15 MG PO TABS
15.0000 mg | ORAL_TABLET | Freq: Every day | ORAL | Status: DC
  Administered 2019-04-13 – 2019-04-16 (×4): 15 mg via ORAL
  Filled 2019-04-11 (×8): qty 1
  Filled 2019-04-11: qty 2

## 2019-04-11 MED ORDER — ROSUVASTATIN CALCIUM 10 MG PO TABS
10.0000 mg | ORAL_TABLET | Freq: Every day | ORAL | Status: DC
  Administered 2019-04-11 – 2019-04-16 (×5): 10 mg via ORAL
  Filled 2019-04-11 (×3): qty 1
  Filled 2019-04-11: qty 2
  Filled 2019-04-11 (×5): qty 1

## 2019-04-11 MED ORDER — TAMSULOSIN HCL 0.4 MG PO CAPS
0.4000 mg | ORAL_CAPSULE | Freq: Every day | ORAL | Status: DC
  Administered 2019-04-11 – 2019-04-15 (×5): 0.4 mg via ORAL
  Filled 2019-04-11 (×8): qty 1

## 2019-04-11 MED ORDER — HYDROCHLOROTHIAZIDE 12.5 MG PO CAPS
12.5000 mg | ORAL_CAPSULE | Freq: Every day | ORAL | Status: DC
  Administered 2019-04-11: 12.5 mg via ORAL
  Filled 2019-04-11 (×5): qty 1

## 2019-04-11 MED ORDER — BUSPIRONE HCL 5 MG PO TABS
7.5000 mg | ORAL_TABLET | Freq: Two times a day (BID) | ORAL | Status: DC
  Administered 2019-04-11 – 2019-04-12 (×2): 7.5 mg via ORAL
  Filled 2019-04-11: qty 1
  Filled 2019-04-11: qty 2
  Filled 2019-04-11 (×3): qty 1.5
  Filled 2019-04-11: qty 2
  Filled 2019-04-11: qty 1
  Filled 2019-04-11: qty 1.5

## 2019-04-11 MED ORDER — PANTOPRAZOLE SODIUM 40 MG PO TBEC
40.0000 mg | DELAYED_RELEASE_TABLET | Freq: Every day | ORAL | Status: DC
  Administered 2019-04-11 – 2019-04-16 (×6): 40 mg via ORAL
  Filled 2019-04-11 (×5): qty 1
  Filled 2019-04-11: qty 2
  Filled 2019-04-11 (×4): qty 1

## 2019-04-11 MED ORDER — SUCRALFATE 1 G PO TABS
1.0000 g | ORAL_TABLET | ORAL | Status: DC
  Administered 2019-04-11 – 2019-04-16 (×14): 1 g via ORAL
  Filled 2019-04-11 (×20): qty 1

## 2019-04-11 MED ORDER — LISINOPRIL-HYDROCHLOROTHIAZIDE 10-12.5 MG PO TABS: 1.0000 | ORAL_TABLET | Freq: Every day | ORAL | Status: DC

## 2019-04-11 MED ORDER — ADULT MULTIVITAMIN W/MINERALS CH
1.0000 | ORAL_TABLET | Freq: Every day | ORAL | Status: DC
  Administered 2019-04-11 – 2019-04-16 (×5): 1 via ORAL
  Filled 2019-04-11 (×9): qty 1

## 2019-04-11 MED ORDER — TRAZODONE HCL 50 MG PO TABS
50.0000 mg | ORAL_TABLET | Freq: Every evening | ORAL | Status: DC | PRN
  Administered 2019-04-11 – 2019-04-16 (×3): 50 mg via ORAL
  Filled 2019-04-11 (×3): qty 1

## 2019-04-11 NOTE — Telephone Encounter (Signed)
Patient was last seen on 04/07/19 but requestion a 90 day supply is this ok to fill for 90 days

## 2019-04-11 NOTE — BH Assessment (Signed)
Assessment Note  Gregg Smith is an 73 y.o. male presents to [facility] voluntarily with son. Pt reports worsening anxiety and panic attacks since his wife died suddenly in 2023/08/18. Pt reports his wife took care of everything and he feels overwhelmed. Pt stated he does not feel safe to go home. Pt denies homicidal thoughts or physical aggression. Pt denies having access to firearms. Pt denies having any legal problems at this time. Pt denies hallucinations. Pt does not appear to be responding to internal stimuli and exhibits no delusional thought. Pt's reality testing appears to be intact. Pt denies any current or past substance abuse problems. Pt does not appear to be intoxicated or in withdrawal at this time. 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 Pt reports he recently started going to [facility] and was put on medications but feels it is not helping and is "out of control". He reports " I feel like I am loosing my mind".   Pt is dressed in street clothes, alert, oriented x4 with pressured rapid  speech and restless motor behavior. Eye contact is good and Pt is pleasant. Pt's mood is anxious and affect is congruent. Thought process is coherent and relevant. Pt's insight is fair and judgement is impaired. There is no indication Pt is currently responding to internal stimuli or experiencing delusional thought content. Pt was cooperative throughout assessment. He says he is willing to sign voluntarily into a psychiatric facility  Diagnosis:  F33.2 Major depressive disorder, Recurrent episode, Severe  Past Medical History:  Past Medical History:  Diagnosis Date  . Allergy    seasonal   . Anxiety   . Arthritis   . Barrett's esophagus   . Cataract   . Chicken pox   . Colon polyps    hyperplastic  . Diverticulosis   . GERD (gastroesophageal reflux disease)   . Hemorrhoids   . Hyperlipidemia   . Hypertension   . Leg cramps   . Measles   . Mumps   . Pneumonia    . Rhinitis   . Sleep apnea    dental device  . Snoring disorder 05/09/2013    Past Surgical History:  Procedure Laterality Date  . APPENDECTOMY  1957  . CATARACT EXTRACTION    . COLONOSCOPY    . JOINT REPLACEMENT    . TONSILLECTOMY    . [place]  . 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 UPPER GASTROINTESTINAL ENDOSCOPY      Family History:  Family History  Problem Relation Age of Onset  . Alzheimer's disease Mother   . Cancer Father   . Stomach cancer Father   . Aortic aneurysm Son   . Heart disease Son   . Cancer Maternal Grandmother   . Heart disease Maternal Grandfather   . Stomach cancer Paternal Grandfather   . Colon cancer Neg Hx   . Colon polyps Neg Hx   . Esophageal cancer Neg Hx   . Rectal cancer Neg Hx   . Pancreatic cancer Neg Hx   . Liver cancer Neg Hx     Social History:  reports that he quit smoking about 12 years ago. His smoking use included cigarettes. He has a 40.00 pack-year smoking history. He has never used smokeless tobacco. He reports current alcohol use of about 4.0 standard  drinks of alcohol per week. He reports that he does not use drugs.  Additional Social History:  Alcohol / Drug Use Pain Medications: See MAR Prescriptions: See MAR Over the Counter: See MAR History of alcohol / drug use?: No history of alcohol / drug abuse  CIWA: CIWA-Ar BP: (!) 156/99 Pulse Rate: 80 COWS:    Allergies:  Allergies  Allergen Reactions  . Penicillins Hives and Rash    Has patient had a PCN reaction causing immediate rash, facial/tongue/throat swelling, SOB or lightheadedness with hypotension:unsure Has patient had a PCN reaction causing severe rash involving mucus membranes or skin necrosis:unsure Has patient had a PCN reaction that required hospitalization:No Has patient had a PCN  reaction occurring within the last 10 years:No If all of the above answers are "NO", then may proceed with Cephalosporin use. Childhood reaction     Home Medications: (Not in a hospital admission)   OB/GYN Status:  No LMP for male patient.  General Assessment Data Location of Assessment: [facility] Assessment Services TTS Assessment: In system Is this a Tele or Face-to-Face Assessment?: Face-to-Face Is this an Initial Assessment or a Re-assessment for this encounter?: Initial Assessment Patient Accompanied by:: Adult Permission Given to speak with another: No Language Other than English: No What gender do you identify as?: Male Marital status: Widowed Living Arrangements: Alone Can pt return to current living arrangement?: Yes Admission Status: Voluntary Is patient capable of signing voluntary admission?: Yes Referral Source: Self/Family/Friend Insurance type: Insurance risk surveyor Exam ([place]) Medical Exam completed: Yes  Crisis Care Plan Living Arrangements: Alone Name of Psychiatrist: [place] Name of Therapist: [facility]  Education Status Is patient currently in school?: No Is the patient employed, unemployed or receiving disability?: Employed  Risk to self with the past 6 months Suicidal Ideation: Yes-Currently Present Has patient been a risk to self within the past 6 months prior to admission? : No Suicidal Intent: No Has patient had any suicidal intent within the past 6 months prior to admission? : No Is patient at risk for suicide?: Yes Suicidal Plan?: No Has patient had any suicidal plan within the past 6 months prior to admission? : No What has been your use of drugs/alcohol within the last 12 months?: None Previous Attempts/Gestures: No Intentional Self Injurious Behavior: None Family Suicide History: No Recent stressful life event(s): Loss (Comment)(Wife died 2023/07/31) Persecutory voices/beliefs?: No Depression: Yes Depression Symptoms: Insomnia,  Isolating Substance abuse history and/or treatment for substance abuse?: No Suicide prevention information given to non-admitted patients: Not applicable  Risk to Others within the past 6 months Homicidal Ideation: No Does patient have any lifetime risk of violence toward others beyond the six months prior to admission? : No Thoughts of Harm to Others: No Current Homicidal Intent: No Current Homicidal Plan: No Access to Homicidal Means: No History of harm to others?: No Assessment of Violence: None Noted Does patient have access to weapons?: No Criminal Charges Pending?: No Does patient have a court date: No Is patient on probation?: No  Psychosis Hallucinations: None noted Delusions: None noted  Mental Status Report Appearance/Hygiene: Unremarkable Eye Contact: Good Motor Activity: Freedom of movement Speech: Logical/coherent, Pressured, Rapid Level of Consciousness: Alert Mood: Anxious Affect: Appropriate to circumstance Anxiety Level: Moderate Thought Processes: Coherent, Relevant Judgement: Partial Orientation: Person, Place, Time, Situation, Appropriate for developmental age Obsessive Compulsive Thoughts/Behaviors: None  Cognitive Functioning Concentration: Normal Memory: Recent Intact Is patient IDD: No Insight: Poor Impulse Control: Poor Appetite: Poor Have you had any  weight changes? : No Change Sleep: Decreased Total Hours of Sleep: 4 Vegetative Symptoms: None  ADLScreening [facility] Assessment Services) Patient's cognitive ability adequate to safely complete daily activities?: Yes Patient able to express need for assistance with ADLs?: Yes Independently performs ADLs?: Yes (appropriate for developmental age)  Prior Inpatient Therapy Prior Inpatient Therapy: Yes Prior Therapy Dates: 1970  Prior Therapy Facilty/Provider(s): [place] Reason for Treatment: Depression  Prior Outpatient Therapy Prior Outpatient Therapy: Yes Prior Therapy Dates:  03/2019 Prior Therapy Facilty/Provider(s): [place] Reason for Treatment: Anxiety Does patient have an ACCT team?: No Does patient have Intensive In-House Services?  : No Does patient have [facility] services? : No Does patient have P4CC services?: No  ADL Screening (condition at time of admission) Patient's cognitive ability adequate to safely complete daily activities?: Yes Is the patient deaf or have difficulty hearing?: No Does the patient have difficulty seeing, even when wearing glasses/contacts?: No Does the patient have difficulty concentrating, remembering, or making decisions?: No Patient able to express need for assistance with ADLs?: Yes Does the patient have difficulty dressing or bathing?: No Independently performs ADLs?: Yes (appropriate for developmental age) Does the patient have difficulty walking or climbing stairs?: No Weakness of Legs: None Weakness of Arms/Hands: None  Home Assistive Devices/Equipment Home Assistive Devices/Equipment: None  Therapy Consults (therapy consults require a physician order) PT Evaluation Needed: No OT Evalulation Needed: No SLP Evaluation Needed: No Abuse/Neglect Assessment (Assessment to be complete while patient is alone) Abuse/Neglect Assessment Can Be Completed: Yes Physical Abuse: Denies, provider concerned (Comment) Verbal Abuse: Denies, provider concerned (Comment) Sexual Abuse: Denies, provider concered (Comment) Exploitation of patient/patient's resources: Denies, provider concerned (Comment) Self-Neglect: Denies, provider concerned (Comment) Values / Beliefs Cultural Requests During Hospitalization: None Spiritual Requests During Hospitalization: None Consults Spiritual Care Consult Needed: No Social Work Consult Needed: No Regulatory affairs officer (For Healthcare) Does Patient Have a Medical Advance Directive?: No Would patient like information on creating a medical advance directive?: No - Patient declined           Disposition: Per Earleen Newport, NP pt meets inpatient criteria. Pt accepted to [facility]  Disposition Initial Assessment Completed for this Encounter: Yes Disposition of Patient: Admit([facility]) Type of inpatient treatment program: Adult  On Site Evaluation by:   Reviewed with Physician:    Steffanie Rainwater, [place], [facility] 04/11/2019 6:09 PM

## 2019-04-11 NOTE — H&P (Signed)
Behavioral Health Medical Screening Exam  Gregg Smith is an 73 y.o. male patient presents as walk in with complaints or worsening anxiety and depression over the last several months.  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  Now having panic attacks and afraid to be in home.  Denies suicidal/self-harm/homicidal ideation, psychosis, and paranoia but states he is afraid to be in home.    Total Time spent with patient: 30 minutes  Psychiatric Specialty Exam: Physical Exam  Vitals reviewed. Constitutional: He is oriented to person, place, and time. He appears well-nourished.  Neck: Normal range of motion. Neck supple.  Respiratory: Effort normal.  Musculoskeletal: Normal range of motion.  Neurological: He is alert and oriented to person, place, and time.  Skin: Skin is warm and dry.  Psychiatric: His speech is normal and behavior is normal. Judgment normal. His mood appears anxious. Thought content is paranoid. Cognition and memory are normal. He exhibits a depressed mood.    Review of Systems  Psychiatric/Behavioral: Positive for depression. Hallucinations: Denies. Substance abuse: Denies. Suicidal ideas: Denies but states he is afraid to be alone; doesn't know what will happen. The patient is nervous/anxious and has insomnia.   All other systems reviewed and are negative.   Blood pressure (!) 156/99, pulse 80, temperature 98.4 F (36.9 C), temperature source Oral, resp. rate 18, SpO2 98 %.There is no height or weight on file to calculate BMI.  General Appearance: Casual  Eye Contact:  Good  Speech:  Clear and Coherent and Normal Rate  Volume:  Normal  Mood:  Anxious, Depressed and Hopeless  Affect:  Depressed  Thought Process:  Coherent and Goal Directed  Orientation:  Full (Time, Place, and  Person)  Thought Content:  WDL and Paranoid Ideation  Suicidal Thoughts:  No  Homicidal Thoughts:  No  Memory:  Immediate;   Good Recent;   Good  Judgement:  Impaired  Insight:  Lacking  Psychomotor Activity:  Normal  Concentration: Concentration: Good and Attention Span: Good  Recall:  Good  Fund of Knowledge:Good  Language: Good  Akathisia:  No  Handed:  Right  AIMS (if indicated):     Assets:  Communication Skills Desire for Improvement Housing Physical Health Social Support  Sleep:       Musculoskeletal: Strength & Muscle Tone: within normal limits Gait & Station: normal Patient leans: N/A  Blood pressure (!) 156/99, pulse 80, temperature 98.4 F (36.9 C), temperature source Oral, resp. rate 18, SpO2 98 %.  Recommendations:  Inpatient psychiatric treatment Based on my evaluation the patient does not appear to have an emergency medical condition.  Dynasti Kerman, NP 04/11/2019, 6:09 PM

## 2019-04-12 ENCOUNTER — Telehealth: Payer: BC Managed Care – PPO | Admitting: Emergency Medicine

## 2019-04-12 ENCOUNTER — Other Ambulatory Visit: Payer: Self-pay

## 2019-04-12 ENCOUNTER — Encounter (HOSPITAL_COMMUNITY): Payer: Self-pay

## 2019-04-12 DIAGNOSIS — F410 Panic disorder [episodic paroxysmal anxiety] without agoraphobia: Secondary | ICD-10-CM

## 2019-04-12 DIAGNOSIS — G4700 Insomnia, unspecified: Secondary | ICD-10-CM

## 2019-04-12 DIAGNOSIS — F411 Generalized anxiety disorder: Secondary | ICD-10-CM

## 2019-04-12 LAB — HEMOGLOBIN A1C
Hgb A1c MFr Bld: 5.3 % (ref 4.8–5.6)
Mean Plasma Glucose: 105.41 mg/dL

## 2019-04-12 LAB — COMPREHENSIVE METABOLIC PANEL
ALT: 26 U/L (ref 0–44)
AST: 31 U/L (ref 15–41)
Albumin: 3.7 g/dL (ref 3.5–5.0)
Alkaline Phosphatase: 66 U/L (ref 38–126)
Anion gap: 9 (ref 5–15)
BUN: 21 mg/dL (ref 8–23)
CO2: 22 mmol/L (ref 22–32)
Calcium: 8.5 mg/dL — ABNORMAL LOW (ref 8.9–10.3)
Chloride: 109 mmol/L (ref 98–111)
Creatinine, Ser: 1.03 mg/dL (ref 0.61–1.24)
GFR calc Af Amer: >60 mL/min (ref >60)
GFR calc non Af Amer: >60 mL/min (ref >60)
Glucose, Bld: 111 mg/dL — ABNORMAL HIGH (ref 70–99)
Potassium: 3.3 mmol/L — ABNORMAL LOW (ref 3.5–5.1)
Sodium: 140 mmol/L (ref 135–145)
Total Bilirubin: 0.6 mg/dL (ref 0.3–1.2)
Total Protein: 6.9 g/dL (ref 6.5–8.1)

## 2019-04-12 LAB — SARS CORONAVIRUS 2 BY RT PCR (HOSPITAL ORDER, PERFORMED IN ~~LOC~~ HOSPITAL LAB): SARS Coronavirus 2: NEGATIVE

## 2019-04-12 LAB — ETHANOL: Alcohol, Ethyl (B): <10 mg/dL (ref <10)

## 2019-04-12 LAB — TSH: TSH: 0.861 u[IU]/mL (ref 0.350–4.500)

## 2019-04-12 MED ORDER — BUSPIRONE HCL 7.5 MG PO TABS
7.5000 mg | ORAL_TABLET | Freq: Three times a day (TID) | ORAL | Status: DC
  Administered 2019-04-12 – 2019-04-13 (×2): 7.5 mg via ORAL
  Filled 2019-04-12 (×5): qty 1

## 2019-04-12 MED ORDER — GABAPENTIN 400 MG PO CAPS
400.0000 mg | ORAL_CAPSULE | Freq: Three times a day (TID) | ORAL | Status: DC
  Administered 2019-04-12 – 2019-04-15 (×8): 400 mg via ORAL
  Filled 2019-04-12 (×11): qty 1

## 2019-04-12 MED ORDER — QUETIAPINE FUMARATE 25 MG PO TABS: 25.0000 mg | ORAL_TABLET | Freq: Two times a day (BID) | ORAL | Status: DC | PRN

## 2019-04-12 MED ORDER — CLONAZEPAM 0.5 MG PO TABS
0.5000 mg | ORAL_TABLET | Freq: Three times a day (TID) | ORAL | Status: DC | PRN
  Administered 2019-04-13: 08:00:00 0.5 mg via ORAL
  Filled 2019-04-12: qty 1

## 2019-04-12 NOTE — H&P (Signed)
Psychiatric Admission Assessment Adult  Patient Identification: Gregg Smith MRN:  [id number] Date of Evaluation:  04/12/2019 Chief Complaint:  Anxiety Principal Diagnosis: <principal problem not specified> Diagnosis:  Active Problems:   MDD (major depressive disorder), severe ([place])  History of Present Illness: Patient is seen and examined.  Patient is a 73 year old male with a past psychiatric history significant for insomnia, worsening anxiety and panic attacks.  The patient originally presented on 04/11/2019.  The patient stated that over the last several weeks his insomnia and anxiety had worsened.  He had received trazodone as well as Ambien for sleep at home.  He stated these did not help.  He stated that he was beginning to feel more alone and isolated between the coronavirus issues as well as the abrupt death of his spouse on 07/28/23.  He is a Forensic scientist at [employer].  He also admitted that he had received a steroid injection approximately a week ago because of sciatic nerve pain.  He stated he had had a previous psychiatric admission over 10 years ago when he was working on his postdoctoral thesis in [place].  He stated he was in the hospital there for 7 to 10 days.  He did not remember having taken any medications, but apparently he did receive ECT.  He denied any previous follow-up with any psychiatric providers.  He stated that prior to the death of his wife he was in usual state of health.  He contacted the mood treatment center, and had been placed on some medications which were not beneficial.  He felt as though he was responding to some internal stimuli, and also having some delusional thinking.  He was admitted to the hospital for evaluation and stabilization.  Associated Signs/Symptoms: Depression Symptoms:  depressed mood, anhedonia, insomnia, psychomotor agitation, fatigue, feelings of worthlessness/guilt, difficulty concentrating, suicidal thoughts without  plan, anxiety, panic attacks, loss of energy/fatigue, disturbed sleep, (Hypo) Manic Symptoms:  Impulsivity, Irritable Mood, Labiality of Mood, Anxiety Symptoms:  Excessive Worry, Panic Symptoms, Psychotic Symptoms:  denied PTSD Symptoms: Negative Total Time spent with patient: 45 minutes  Past Psychiatric History: Patient had 1 previous admission to a psychiatric facility in [place] greater than 10 years ago.  He received ECT.  He does not recall having taken any medications at that time.  After a 7 to 10 days in hospital he did not follow-up with psychiatry either in [place] or the [place].  He has been treated for insomnia with Ambien as well as trazodone, and it looks like he did get a prescription for Xanax somewhere down the line.  Is the patient at risk to self? Yes.    Has the patient been a risk to self in the past 6 months? No.  Has the patient been a risk to self within the distant past? Yes.    Is the patient a risk to others? No.  Has the patient been a risk to others in the past 6 months? No.  Has the patient been a risk to others within the distant past? No.   Prior Inpatient Therapy: Prior Inpatient Therapy: Yes Prior Therapy Dates: 1970  Prior Therapy Facilty/Provider(s): [place] Reason for Treatment: Depression Prior Outpatient Therapy: Prior Outpatient Therapy: Yes Prior Therapy Dates: 03/2019 Prior Therapy Facilty/Provider(s): [place] Reason for Treatment: Anxiety Does patient have an ACCT team?: No Does patient have Intensive In-House Services?  : No Does patient have [facility] services? : No Does patient have P4CC services?: No  Alcohol Screening:  Substance Abuse History in the last 12 months:  No. Consequences of Substance Abuse: Negative Previous Psychotropic Medications: No  Psychological Evaluations: No  Past Medical History:  Past Medical History:  Diagnosis Date  . Allergy    seasonal   . Anxiety   . Arthritis   . Barrett's  esophagus   . Cataract   . Chicken pox   . Colon polyps    hyperplastic  . Diverticulosis   . GERD (gastroesophageal reflux disease)   . Hemorrhoids   . Hyperlipidemia   . Hypertension   . Leg cramps   . Measles   . Mumps   . Pneumonia   . Rhinitis   . Sleep apnea    dental device  . Snoring disorder 05/09/2013    Past Surgical History:  Procedure Laterality Date  . APPENDECTOMY  1957  . CATARACT EXTRACTION    . COLONOSCOPY    . JOINT REPLACEMENT    . TONSILLECTOMY    . [place]  . 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 UPPER GASTROINTESTINAL ENDOSCOPY     Family History:  Family History  Problem Relation Age of Onset  . Alzheimer's disease Mother   . Cancer Father   . Stomach cancer Father   . Aortic aneurysm Son   . Heart disease Son   . Cancer Maternal Grandmother   . Heart disease Maternal Grandfather   . Stomach cancer Paternal Grandfather   . Colon cancer Neg Hx   . Colon polyps Neg Hx   . Esophageal cancer Neg Hx   . Rectal cancer Neg Hx   . Pancreatic cancer Neg Hx   . Liver cancer Neg Hx    Family Psychiatric  History: Denied Tobacco Screening:   Social History:  Social History   Substance and Sexual Activity  Alcohol Use Yes  . Alcohol/week: 4.0 standard drinks  . Types: 4 Cans of beer per week     Social History   Substance and Sexual Activity  Drug Use No    Additional Social History: Marital status: Widowed    Pain Medications: See MAR Prescriptions: See MAR Over the Counter: See MAR History of alcohol / drug use?: No history of alcohol / drug abuse                    Allergies:   Allergies  Allergen Reactions  . Penicillins Hives and Rash    Has patient had a PCN reaction causing immediate rash, facial/tongue/throat swelling, SOB or lightheadedness with  hypotension:unsure Has patient had a PCN reaction causing severe rash involving mucus membranes or skin necrosis:unsure Has patient had a PCN reaction that required hospitalization:No Has patient had a PCN reaction occurring within the last 10 years:No If all of the above answers are "NO", then may proceed with Cephalosporin use. Childhood reaction    Lab Results:  Results for orders placed or performed during the hospital encounter of 04/11/19 (from the past 48 hour(s))  CBC     Status: None   Collection Time: 04/11/19  6:24 PM  Result Value Ref Range   WBC 8.7 4.0 - 10.5 K/uL   RBC 5.30 4.22 - 5.81 MIL/uL   Hemoglobin 15.1 13.0 - 17.0 g/dL   HCT 45.9 39.0 - 52.0 %   MCV 86.6 80.0 - 100.0 fL   MCH  28.5 26.0 - 34.0 pg   MCHC 32.9 30.0 - 36.0 g/dL   RDW 14.6 11.5 - 15.5 %   Platelets 221 150 - 400 K/uL   nRBC 0.0 0.0 - 0.2 %    Comment: Performed at [facility], [place] [address], [place], [place] [address]  Comprehensive metabolic panel     Status: Abnormal   Collection Time: 04/11/19  6:24 PM  Result Value Ref Range   Sodium 137 135 - 145 mmol/L   Potassium 3.2 (L) 3.5 - 5.1 mmol/L   Chloride 106 98 - 111 mmol/L   CO2 22 22 - 32 mmol/L   Glucose, Bld 125 (H) 70 - 99 mg/dL   BUN 20 8 - 23 mg/dL   Creatinine, Ser 0.98 0.61 - 1.24 mg/dL   Calcium 8.7 (L) 8.9 - 10.3 mg/dL   Total Protein 7.4 6.5 - 8.1 g/dL   Albumin 4.1 3.5 - 5.0 g/dL   AST 35 15 - 41 U/L   ALT 29 0 - 44 U/L   Alkaline Phosphatase 75 38 - 126 U/L   Total Bilirubin 0.8 0.3 - 1.2 mg/dL   GFR calc non Af Amer >60 >60 mL/min   GFR calc Af Amer >60 >60 mL/min   Anion gap 9 5 - 15    Comment: Performed at [facility], [place] [address] [place], [place] [address]  Hemoglobin A1c     Status: None   Collection Time: 04/11/19  6:24 PM  Result Value Ref Range   Hgb A1c MFr Bld 5.3 4.8 - 5.6 %    Comment: (NOTE) Pre diabetes:          5.7%-6.4% Diabetes:               >6.4% Glycemic control for   <7.0% adults with diabetes    Mean Plasma Glucose 105.41 mg/dL    Comment: Performed at [place] [address], [place], [place] [address]  Lipid panel     Status: Abnormal   Collection Time: 04/11/19  6:24 PM  Result Value Ref Range   Cholesterol 155 0 - 200 mg/dL   Triglycerides 114 <150 mg/dL   HDL 39 (L) >40 mg/dL   Total CHOL/HDL Ratio 4.0 RATIO   VLDL 23 0 - 40 mg/dL   LDL Cholesterol 93 0 - 99 mg/dL    Comment:        Total Cholesterol/HDL:CHD Risk Coronary Heart Disease Risk Table                     Men   Women  1/2 Average Risk   3.4   3.3  Average Risk       5.0   4.4  2 X Average Risk   9.6   7.1  3 X Average Risk  23.4   11.0        Use the calculated Patient Ratio above and the CHD Risk Table to determine the patient's CHD Risk.        ATP III CLASSIFICATION (LDL):  <100     mg/dL   Optimal  100-129  mg/dL   Near or Above                    Optimal  130-159  mg/dL   Borderline  160-189  mg/dL   High  >190     mg/dL   Very High Performed at [facility],  [place] [address], [place], [place] [address]   Urinalysis, Complete w Microscopic     Status: None   Collection Time: 04/11/19  6:57 PM  Result Value Ref Range   Color, Urine YELLOW YELLOW   APPearance CLEAR CLEAR   Specific Gravity, Urine 1.010 1.005 - 1.030   pH 5.0 5.0 - 8.0   Glucose, UA NEGATIVE NEGATIVE mg/dL   Hgb urine dipstick NEGATIVE NEGATIVE   Bilirubin Urine NEGATIVE NEGATIVE   Ketones, ur NEGATIVE NEGATIVE mg/dL   Protein, ur NEGATIVE NEGATIVE mg/dL   Nitrite NEGATIVE NEGATIVE   Leukocytes,Ua NEGATIVE NEGATIVE   RBC / HPF 0-5 0 - 5 RBC/hpf   WBC, UA 0-5 0 - 5 WBC/hpf   Bacteria, UA NONE SEEN NONE SEEN   Mucus PRESENT     Comment: Performed at [facility], [place] [address], [place], [place] [address]  Urine rapid drug screen (hosp performed)not at [facility]     Status: None   Collection Time: 04/11/19  6:57 PM   Result Value Ref Range   Opiates NONE DETECTED NONE DETECTED   Cocaine NONE DETECTED NONE DETECTED   Benzodiazepines NONE DETECTED NONE DETECTED   Amphetamines NONE DETECTED NONE DETECTED   Tetrahydrocannabinol NONE DETECTED NONE DETECTED   Barbiturates NONE DETECTED NONE DETECTED    Comment: (NOTE) DRUG SCREEN FOR MEDICAL PURPOSES ONLY.  IF CONFIRMATION IS NEEDED FOR ANY PURPOSE, NOTIFY LAB WITHIN 5 DAYS. LOWEST DETECTABLE LIMITS FOR URINE DRUG SCREEN Drug Class                     Cutoff (ng/mL) Amphetamine and metabolites    1000 Barbiturate and metabolites    200 Benzodiazepine                 962 Tricyclics and metabolites     300 Opiates and metabolites        300 Cocaine and metabolites        300 THC                            50 Performed at [facility], [place] [address], [place], [place] [address]   SARS Coronavirus 2 [facility] order, Performed in [facility] hospital lab)     Status: None   Collection Time: 04/11/19 11:00 PM  Result Value Ref Range   SARS Coronavirus 2 NEGATIVE NEGATIVE    Comment: (NOTE) If result is NEGATIVE SARS-CoV-2 target nucleic acids are NOT DETECTED. The SARS-CoV-2 RNA is generally detectable in upper and lower  respiratory specimens during the acute phase of infection. The lowest  concentration of SARS-CoV-2 viral copies this assay can detect is 250  copies / mL. A negative result does not preclude SARS-CoV-2 infection  and should not be used as the sole basis for treatment or other  patient management decisions.  A negative result may occur with  improper specimen collection / handling, submission of specimen other  than nasopharyngeal swab, presence of viral mutation(s) within the  areas targeted by this assay, and inadequate number of viral copies  (<250 copies / mL). A negative result must be combined with clinical  observations, patient history, and epidemiological information. If result is POSITIVE SARS-CoV-2  target nucleic acids are DETECTED. The SARS-CoV-2 RNA is generally detectable in upper and lower  respiratory specimens dur ing the acute phase of infection.  Positive  results are indicative of active infection with SARS-CoV-2.  Clinical  correlation with patient history and other diagnostic information is  necessary to determine patient infection status.  Positive results do  not rule out bacterial infection or co-infection with other viruses. If result is PRESUMPTIVE POSTIVE SARS-CoV-2 nucleic acids MAY BE PRESENT.   A presumptive positive result was obtained on the submitted specimen  and confirmed on repeat testing.  While 2019 novel coronavirus  (SARS-CoV-2) nucleic acids may be present in the submitted sample  additional confirmatory testing may be necessary for epidemiological  and / or clinical management purposes  to differentiate between  SARS-CoV-2 and other Sarbecovirus currently known to infect humans.  If clinically indicated additional testing with an alternate test  methodology [phone]) is advised. The SARS-CoV-2 RNA is generally  detectable in upper and lower respiratory sp ecimens during the acute  phase of infection. The expected result is Negative. Fact Sheet for Patients:  StrictlyIdeas.no Fact Sheet for Healthcare Providers: BankingDealers.co.za This test is not yet approved or cleared by the [place] FDA and has been authorized for detection and/or diagnosis of SARS-CoV-2 by FDA under an Emergency Use Authorization (EUA).  This EUA will remain in effect (meaning this test can be used) for the duration of the COVID-19 declaration under Section 564(b)(1) of the Act, 21 U.S.C. section 360bbb-3(b)(1), unless the authorization is terminated or revoked sooner. Performed at [facility], [place] [address] [place], [place] [address]     Blood Alcohol level:  No results found for:  [facility]  Metabolic Disorder Labs:  Lab Results  Component Value Date   HGBA1C 5.3 04/11/2019   MPG 105.41 04/11/2019   MPG 111 09/05/2015   No results found for: PROLACTIN Lab Results  Component Value Date   CHOL 155 04/11/2019   TRIG 114 04/11/2019   HDL 39 (L) 04/11/2019   CHOLHDL 4.0 04/11/2019   VLDL 23 04/11/2019   LDLCALC 93 04/11/2019   LDLCALC 97 08/03/2018    Current Medications: Current Facility-Administered Medications  Medication Dose Route Frequency Provider Last Rate Last Dose  . busPIRone (BUSPAR) tablet 7.5 mg  7.5 mg Oral TID Sharma Covert, MD      . clonazePAM [place]bye [place]) tablet 0.5 mg  0.5 mg Oral TID PRN Sharma Covert, MD      . gabapentin (NEURONTIN) capsule 400 mg  400 mg Oral TID Sharma Covert, MD      . lisinopril (ZESTRIL) tablet 10 mg  10 mg Oral Daily Cobos, Myer Peer, MD   10 mg at 04/11/19 2302   And  . hydrochlorothiazide (MICROZIDE) capsule 12.5 mg  12.5 mg Oral Daily Cobos, Myer Peer, MD   12.5 mg at 04/11/19 2303  . meloxicam (MOBIC) tablet 15 mg  15 mg Oral Daily Rankin, Shuvon B, NP      . multivitamin with minerals tablet 1 tablet  1 tablet Oral Daily Rankin, Shuvon B, NP   1 tablet at 04/11/19 2303  . pantoprazole (PROTONIX) EC tablet 40 mg  40 mg Oral Daily Rankin, Shuvon B, NP   40 mg at 04/11/19 2303  . QUEtiapine (SEROQUEL) tablet 25 mg  25 mg Oral BID PRN Sharma Covert, MD      . rosuvastatin (CRESTOR) tablet 10 mg  10 mg Oral Daily Rankin, Shuvon B, NP   10 mg at 04/11/19 2305  . sucralfate (CARAFATE) tablet 1 g  1 g Oral 3 times per day Rankin, Shuvon B, NP   1 g at 04/12/19 1214  .  tamsulosin (FLOMAX) capsule 0.4 mg  0.4 mg Oral QHS Rankin, Shuvon B, NP   0.4 mg at 04/11/19 2127  . traZODone (DESYREL) tablet 50 mg  50 mg Oral QHS PRN Rankin, Shuvon B, NP   50 mg at 04/11/19 2136   PTA Medications: Medications Prior to Admission  Medication Sig Dispense Refill Last Dose  . busPIRone (BUSPAR) 7.5 MG tablet TAKE 1  TABLET (7.5 MG TOTAL) BY MOUTH 2 (TWO) TIMES DAILY. 180 tablet 1   . lansoprazole (PREVACID) 30 MG capsule Take 1 capsule (30 mg total) by mouth 2 (two) times daily before a meal. NEEDS VISIT FOR FURTHER REFILLS!!!!!! 180 capsule 0   . lisinopril-hydrochlorothiazide (ZESTORETIC) 10-12.5 MG tablet TAKE 1 TABLET BY MOUTH EVERY DAY (Patient taking differently: Take 1 tablet by mouth daily. ) 90 tablet 1   . meloxicam (MOBIC) 15 MG tablet Take 15 mg by mouth daily.     . Multiple Vitamin (MULTIVITAMIN) tablet Take 1 tablet by mouth daily.     . rosuvastatin (CRESTOR) 10 MG tablet TAKE 1 TABLET BY MOUTH EVERY DAY (Patient taking differently: Take 10 mg by mouth daily. ) 90 tablet 3   . sucralfate (CARAFATE) 1 g tablet AVOID MORNING DOSE, AND TAKE AT LUNCH, DINNER AND BEDTIME (Patient taking differently: Take 1 g by mouth See admin instructions. Avoid morning dose, and take at lunch, dinner and bedtime.) 270 tablet 3   . tamsulosin (FLOMAX) 0.4 MG CAPS capsule TAKE 1 CAPSULE BY MOUTH DAILY AT NIGHT. (Patient taking differently: Take 0.4 mg by mouth at bedtime. ) 90 capsule 3     Musculoskeletal: Strength & Muscle Tone: within normal limits Gait & Station: normal Patient leans: N/A  Psychiatric Specialty Exam: Physical Exam  Nursing note and vitals reviewed. Constitutional: He is oriented to person, place, and time. He appears well-developed and well-nourished.  HENT:  Head: Normocephalic and atraumatic.  Respiratory: Effort normal.  Neurological: He is alert and oriented to person, place, and time.    ROS  Blood pressure 138/78, pulse 70, temperature 98.3 F (36.8 C), temperature source Oral, resp. rate 18, height 6' (1.829 m), weight 112 kg, SpO2 97 %.Body mass index is 33.5 kg/m.  General Appearance: Disheveled  Eye Contact:  Fair  Speech:  Pressured  Volume:  Increased  Mood:  Anxious  Affect:  Congruent  Thought Process:  Coherent and Descriptions of Associations: Intact   Orientation:  Full (Time, Place, and Person)  Thought Content:  Logical and Rumination  Suicidal Thoughts:  No  Homicidal Thoughts:  No  Memory:  Immediate;   Fair Recent;   Fair Remote;   Fair  Judgement:  Intact  Insight:  Fair  Psychomotor Activity:  Increased  Concentration:  Concentration: Fair and Attention Span: Fair  Recall:  [employer] of Knowledge:  Good  Language:  Good  Akathisia:  Negative  Handed:  Right  AIMS (if indicated):     Assets:  Desire for Improvement Resilience  ADL's:  Intact  Cognition:  WNL  Sleep:       Treatment Plan Summary: Daily contact with patient to assess and evaluate symptoms and progress in treatment, Medication management and Plan : Patient is seen and examined.  Patient is a 73 year old male with the above-stated past psychiatric history who was admitted to the hospital secondary to worsening anxiety, insomnia, agitation.  Probably a contributing factor to his anxiety and insomnia is the fact he received a steroid injection about 5 to 7  days ago.  That probably has made things worse.  He will be admitted to the hospital.  He will be integrated into the milieu.  He will be encouraged to attend groups.  He has been placed on buspirone as well as gabapentin for anxiety and chronic pain.  I am going to increase his BuSpar to 7.5 mg p.o. 3 times daily.  Additionally I am going to increase his gabapentin to 400 mg p.o. 3 times daily.  I am also going to write for clonazepam 0.5 mg p.o. twice daily as needed anxiety.  He will also have available trazodone.  He did sleep well with that last night.  I am also going to write for Seroquel 50 mg p.o. nightly in case this is his first manic episode.  Review of his laboratories revealed a potassium that was slightly low at 3.2.  Blood sugar that was elevated at 125.  Normal liver function enzymes.  Normal CBC with differential.  Drug screen was completely negative, but he has not had a thyroid checked.  We will  order a TSH.  Observation Level/Precautions:  15 minute checks  Laboratory:  Chemistry Profile  Psychotherapy:    Medications:    Consultations:    Discharge Concerns:    Estimated LOS:  Other:     Physician Treatment Plan for Primary Diagnosis: <principal problem not specified> Long Term Goal(s): Improvement in symptoms so as ready for discharge  Short Term Goals: Ability to identify changes in lifestyle to reduce recurrence of condition will improve, Ability to verbalize feelings will improve, Ability to disclose and discuss suicidal ideas, Ability to demonstrate self-control will improve, Ability to identify and develop effective coping behaviors will improve and Ability to maintain clinical measurements within normal limits will improve  Physician Treatment Plan for Secondary Diagnosis: Active Problems:   MDD (major depressive disorder), severe ([place])  Long Term Goal(s): Improvement in symptoms so as ready for discharge  Short Term Goals: Ability to identify changes in lifestyle to reduce recurrence of condition will improve, Ability to verbalize feelings will improve, Ability to disclose and discuss suicidal ideas, Ability to demonstrate self-control will improve, Ability to identify and develop effective coping behaviors will improve and Ability to maintain clinical measurements within normal limits will improve  I certify that inpatient services furnished can reasonably be expected to improve the patient's condition.    Sharma Covert, MD 8/11/20202:30 PM

## 2019-04-12 NOTE — Progress Notes (Signed)
Patient ID: Gregg Smith, male   DOB: [birth year], 73 y.o.   MRN: [id number]  Patient taken to adult. Report given to [employer].

## 2019-04-12 NOTE — Progress Notes (Signed)
Pt has not been moved to adult unit due to pending COVID 19 test result. Pt covid 19 test  performed earlier in the day, but [facility] lab people said they did not have the order in the chart to perform the test, and the specimen collected  did not have enough fluid to do the testing. The specimen was recollected again around midnight, by the time result came back, the pt was already a sleep.

## 2019-04-12 NOTE — [facility] Group Notes (Signed)
LCSW Group Therapy Note 04/12/2019 2:24 PM  Type of Therapy and Topic: Group Therapy: Overcoming Obstacles  Participation Level: Active  Description of Group:  In this group patients will be encouraged to explore what they see as obstacles to their own wellness and recovery. They will be guided to discuss their thoughts, feelings, and behaviors related to these obstacles. The group will process together ways to cope with barriers, with attention given to specific choices patients can make. Each patient will be challenged to identify changes they are motivated to make in order to overcome their obstacles. This group will be process-oriented, with patients participating in exploration of their own experiences as well as giving and receiving support and challenge from other group members.  Therapeutic Goals: 1. Patient will identify personal and current obstacles as they relate to admission. 2. Patient will identify barriers that currently interfere with their wellness or overcoming obstacles.  3. Patient will identify feelings, thought process and behaviors related to these barriers. 4. Patient will identify two changes they are willing to make to overcome these obstacles:   Summary of Patient Progress  Gregg Smith was engaged and participated throughout the group session. Gregg Smith reports that his main obstacle is grieving the death of his wife, who passed away in 07-Aug-2018. He reports that they were married over 30 years and that it has been challenging coping and adjusting to being alone.    Therapeutic Modalities:  Cognitive Behavioral Therapy Solution Focused Therapy Motivational Interviewing Relapse Prevention Therapy   Theresa Duty Clinical Social Worker

## 2019-04-12 NOTE — Progress Notes (Signed)
Patient ID: Gregg Smith, male   DOB: 07/29/1946, 73 y.o.   MRN: [id number] [place] NOVEL CORONAVIRUS (COVID-19) DAILY CHECK-OFF SYMPTOMS - answer yes or no to each - every day NO YES  Have you had a fever in the past 24 hours?  . Fever (Temp > 37.80C / 100F) X   Have you had any of these symptoms in the past 24 hours? . New Cough .  Sore Throat  .  Shortness of Breath .  Difficulty Breathing .  Unexplained Body Aches   X   Have you had any one of these symptoms in the past 24 hours not related to allergies?   . Runny Nose .  Nasal Congestion .  Sneezing   X   If you have had runny nose, nasal congestion, sneezing in the past 24 hours, has it worsened?  X   EXPOSURES - check yes or no X   Have you traveled outside the state in the past 14 days?  X   Have you been in contact with someone with a confirmed diagnosis of COVID-19 or PUI in the past 14 days without wearing appropriate PPE?  X   Have you been living in the same home as a person with confirmed diagnosis of COVID-19 or a PUI (household contact)?    X   Have you been diagnosed with COVID-19?    X              What to do next: Answered NO to all: Answered YES to anything:   Proceed with unit schedule Follow the [facility] Inpatient Flowsheet.

## 2019-04-12 NOTE — Progress Notes (Signed)
Has appt for 04/12/2019

## 2019-04-12 NOTE — Progress Notes (Signed)
Progress note  D:pt found in the dayroom interacting with peers. Pt denies any physical complaints. Pt seems groggy. Pt requested sleep medications. Pt states he takes his lisinopril and gabapentin before bedtime and is requesting this be changed. Pt is pleasant but requires teaching multiple times with some aspects of care. Pt denies si/hi/ah/vh and verbally agrees to approach staff if these become apparent or before harming himself/others while at [facility]: Pt provided support and encouragement. Pt given medication per protocol and standing orders. Q47m safety checks implemented and continued.  R: Pt safe on the unit. Will continue to monitor.

## 2019-04-12 NOTE — Tx Team (Signed)
Initial Treatment Plan 04/12/2019 11:00 AM Gregg Smith GIT:[id number]    PATIENT STRESSORS: Loss of wife   PATIENT STRENGTHS: Ability for insight Average or above average intelligence Capable of independent living Communication skills [place] of knowledge Motivation for treatment/growth Supportive family/friends Work skills   PATIENT IDENTIFIED PROBLEMS: "I began having anxiety attacks"                     DISCHARGE CRITERIA:  Improved stabilization in mood, thinking, and/or behavior Verbal commitment to aftercare and medication compliance  PRELIMINARY DISCHARGE PLAN: Return to previous living arrangement  PATIENT/FAMILY INVOLVEMENT: This treatment plan has been presented to and reviewed with the patient, Gregg Smith, and/or family member.  The patient and family have been given the opportunity to ask questions and make suggestions.  Clarita Crane, RN 04/12/2019, 11:00 AM

## 2019-04-12 NOTE — [facility] Suicide Risk Assessment (Signed)
Comprehensive [facility] Ad[facility] Suicide Risk Assessment   Nursing information obtained from:  Patient Demographic factors:  Male, Age 73 or older Current Mental Status:  NA Loss Factors:  NA Historical Factors:  NA Risk Reduction Factors:  NA  Total Time spent with patient: 45 minutes Principal Problem: <principal problem not specified> Diagnosis:  Active Problems:   MDD (major depressive disorder), severe (HCC)  Subjective Data: Patient is seen and examined.  Patient is a 73 year old male with a past psychiatric history significant for insomnia, worsening anxiety and panic attacks.  The patient originally presented on 04/11/2019.  The patient stated that over the last several weeks his insomnia and anxiety had worsened.  He had received trazodone as well as Ambien for sleep at home.  He stated these did not help.  He stated that he was beginning to feel more alone and isolated between the coronavirus issues as well as the abrupt death of his spouse on August 06, 2023.  He is a Forensic scientist at [employer].  He also admitted that he had received a steroid injection approximately a week ago because of sciatic nerve pain.  He stated he had had a previous psychiatric admission over 10 years ago when he was working on his postdoctoral thesis in [place].  He stated he was in the hospital there for 7 to 10 days.  He did not remember having taken any medications, but apparently he did receive ECT.  He denied any previous follow-up with any psychiatric providers.  He stated that prior to the death of his wife he was in usual state of health.  He contacted the mood treatment center, and had been placed on some medications which were not beneficial.  He felt as though he was responding to some internal stimuli, and also having some delusional thinking.  He was admitted to the hospital for evaluation and stabilization.  Continued Clinical Symptoms:    The "Alcohol Use Disorders Identification Test", Guidelines for Use in  Primary Care, Second Edition.  World Pharmacologist [facility]). Score between 0-7:  no or low risk or alcohol related problems. Score between 8-15:  moderate risk of alcohol related problems. Score between 16-19:  high risk of alcohol related problems. Score 20 or above:  warrants further diagnostic evaluation for alcohol dependence and treatment.   CLINICAL FACTORS:   Severe Anxiety and/or Agitation Panic Attacks Depression:   Anhedonia Delusional Hopelessness Impulsivity Insomnia Chronic Pain More than one psychiatric diagnosis Previous Psychiatric Diagnoses and Treatments   Musculoskeletal: Strength & Muscle Tone: within normal limits Gait & Station: normal Patient leans: N/A  Psychiatric Specialty Exam: Physical Exam  Nursing note and vitals reviewed. Constitutional: He is oriented to person, place, and time. He appears well-developed and well-nourished.  HENT:  Head: Normocephalic and atraumatic.  Respiratory: Effort normal.  Neurological: He is alert and oriented to person, place, and time.    ROS  Blood pressure 138/78, pulse 70, temperature 98.3 F (36.8 C), temperature source Oral, resp. rate 18, height 6' (1.829 m), weight 112 kg, SpO2 97 %.Body mass index is 33.5 kg/m.  General Appearance: Disheveled  Eye Contact:  Good  Speech:  Pressured  Volume:  Increased  Mood:  Anxious  Affect:  Congruent  Thought Process:  Coherent and Descriptions of Associations: Intact  Orientation:  Full (Time, Place, and Person)  Thought Content:  Logical  Suicidal Thoughts:  No  Homicidal Thoughts:  No  Memory:  Immediate;   Fair Recent;   Fair Remote;  Fair  Judgement:  Impaired  Insight:  Fair  Psychomotor Activity:  Increased  Concentration:  Concentration: Fair and Attention Span: Fair  Recall:  [employer] of Knowledge:  Fair  Language:  Fair  Akathisia:  Negative  Handed:  Right  AIMS (if indicated):     Assets:  Desire for Improvement Resilience  ADL's:   Intact  Cognition:  WNL  Sleep:         COGNITIVE FEATURES THAT CONTRIBUTE TO RISK:  Thought constriction (tunnel vision)    SUICIDE RISK:   Moderate:  Frequent suicidal ideation with limited intensity, and duration, some specificity in terms of plans, no associated intent, good self-control, limited dysphoria/symptomatology, some risk factors present, and identifiable protective factors, including available and accessible social support.  PLAN OF CARE: Patient is seen and examined.  Patient is a 73 year old male with the above-stated past psychiatric history who was admitted to the hospital secondary to worsening anxiety, insomnia, agitation.  Probably a contributing factor to his anxiety and insomnia is the fact he received a steroid injection about 5 to 7 days ago.  That probably has made things worse.  He will be admitted to the hospital.  He will be integrated into the milieu.  He will be encouraged to attend groups.  He has been placed on buspirone as well as gabapentin for anxiety and chronic pain.  I am going to increase his BuSpar to 7.5 mg p.o. 3 times daily.  Additionally I am going to increase his gabapentin to 400 mg p.o. 3 times daily.  I am also going to write for clonazepam 0.5 mg p.o. twice daily as needed anxiety.  He will also have available trazodone.  He did sleep well with that last night.  I am also going to write for Seroquel 50 mg p.o. nightly in case this is his first manic episode.  Review of his laboratories revealed a potassium that was slightly low at 3.2.  Blood sugar that was elevated at 125.  Normal liver function enzymes.  Normal CBC with differential.  Drug screen was completely negative, but he has not had a thyroid checked.  We will order a TSH.  I certify that inpatient services furnished can reasonably be expected to improve the patient's condition.   Sharma Covert, MD 04/12/2019, 2:21 PM

## 2019-04-13 MED ORDER — BUSPIRONE HCL 10 MG PO TABS
10.0000 mg | ORAL_TABLET | Freq: Three times a day (TID) | ORAL | Status: DC
  Administered 2019-04-13 – 2019-04-16 (×10): 10 mg via ORAL
  Filled 2019-04-13 (×15): qty 1

## 2019-04-13 MED ORDER — QUETIAPINE FUMARATE 200 MG PO TABS
200.0000 mg | ORAL_TABLET | Freq: Every day | ORAL | Status: DC
  Administered 2019-04-13: 200 mg via ORAL
  Filled 2019-04-13 (×2): qty 1

## 2019-04-13 MED ORDER — LISINOPRIL 10 MG PO TABS
10.0000 mg | ORAL_TABLET | Freq: Every day | ORAL | Status: DC
  Administered 2019-04-13 – 2019-04-15 (×3): 10 mg via ORAL
  Filled 2019-04-13 (×5): qty 1

## 2019-04-13 MED ORDER — HYDROCHLOROTHIAZIDE 12.5 MG PO CAPS
12.5000 mg | ORAL_CAPSULE | Freq: Every day | ORAL | Status: DC
  Administered 2019-04-14 – 2019-04-16 (×3): 12.5 mg via ORAL
  Filled 2019-04-13 (×5): qty 1

## 2019-04-13 MED ORDER — QUETIAPINE FUMARATE 100 MG PO TABS
100.0000 mg | ORAL_TABLET | Freq: Every day | ORAL | Status: DC
  Filled 2019-04-13: qty 1

## 2019-04-13 NOTE — Plan of Care (Signed)
D: Denies SI, HI, AVH, and verbally contracts for safety.    A: Medications administered per MD order. Support provided. Patient educated on safety on the unit and medications. Routine safety checks every 15 minutes. Patient stated understanding to tell nurse about any new physical symptoms. Patient understands to tell staff of any needs.     R: No adverse drug reactions noted. Patient verbally contracts for safety. Patient remains safe at this time and will continue to monitor.   Problem: Safety: Goal: Periods of time without injury will increase Outcome: Progressing   [place] NOVEL CORONAVIRUS (COVID-19) DAILY CHECK-OFF SYMPTOMS - answer yes or no to each - every day NO YES  Have you had a fever in the past 24 hours?  Fever (Temp > 37.80C / 100F) X   Have you had any of these symptoms in the past 24 hours? New Cough  Sore Throat   Shortness of Breath  Difficulty Breathing  Unexplained Body Aches   X   Have you had any one of these symptoms in the past 24 hours not related to allergies?   Runny Nose  Nasal Congestion  Sneezing   X   If you have had runny nose, nasal congestion, sneezing in the past 24 hours, has it worsened?  X   EXPOSURES - check yes or no X   Have you traveled outside the state in the past 14 days?  X   Have you been in contact with someone with a confirmed diagnosis of COVID-19 or PUI in the past 14 days without wearing appropriate PPE?  X   Have you been living in the same home as a person with confirmed diagnosis of COVID-19 or a PUI (household contact)?    X   Have you been diagnosed with COVID-19?    X              What to do next: Answered NO to all: Answered YES to anything:   Proceed with unit schedule Follow the [facility] Inpatient Flowsheet.

## 2019-04-13 NOTE — Tx Team (Signed)
Interdisciplinary Treatment and Diagnostic Plan Update  04/13/2019 Time of Session: 11:04am Gregg Smith MRN: [id number]  Principal Diagnosis: <principal problem not specified>  Secondary Diagnoses: Active Problems:   MDD (major depressive disorder), severe (HCC)   Current Medications:  Current Facility-Administered Medications  Medication Dose Route Frequency Provider Last Rate Last Dose  . busPIRone (BUSPAR) tablet 10 mg  10 mg Oral TID Sharma Covert, MD   10 mg at 04/13/19 1200  . clonazePAM (KLONOPIN) tablet 0.5 mg  0.5 mg Oral TID PRN Sharma Covert, MD   0.5 mg at 04/13/19 0801  . gabapentin (NEURONTIN) capsule 400 mg  400 mg Oral TID Sharma Covert, MD   400 mg at 04/13/19 1200  . lisinopril (ZESTRIL) tablet 10 mg  10 mg Oral QHS Minda Ditto, RPH       And  . [START ON 04/14/2019] hydrochlorothiazide (MICROZIDE) capsule 12.5 mg  12.5 mg Oral Daily Minda Ditto, RPH      . meloxicam (MOBIC) tablet 15 mg  15 mg Oral Daily Rankin, Shuvon B, NP   15 mg at 04/13/19 1030  . multivitamin with minerals tablet 1 tablet  1 tablet Oral Daily Rankin, Shuvon B, NP   1 tablet at 04/13/19 1030  . pantoprazole (PROTONIX) EC tablet 40 mg  40 mg Oral Daily Rankin, Shuvon B, NP   40 mg at 04/13/19 1031  . QUEtiapine (SEROQUEL) tablet 200 mg  200 mg Oral QHS Sharma Covert, MD      . rosuvastatin (CRESTOR) tablet 10 mg  10 mg Oral Daily Rankin, Shuvon B, NP   10 mg at 04/13/19 1031  . sucralfate (CARAFATE) tablet 1 g  1 g Oral 3 times per day Rankin, Shuvon B, NP   1 g at 04/13/19 1200  . tamsulosin (FLOMAX) capsule 0.4 mg  0.4 mg Oral QHS Rankin, Shuvon B, NP   0.4 mg at 04/12/19 2150  . traZODone (DESYREL) tablet 50 mg  50 mg Oral QHS PRN Rankin, Shuvon B, NP   50 mg at 04/12/19 2150   PTA Medications: Medications Prior to Admission  Medication Sig Dispense Refill Last Dose  . busPIRone (BUSPAR) 7.5 MG tablet TAKE 1 TABLET (7.5 MG TOTAL) BY MOUTH 2 (TWO) TIMES DAILY.  180 tablet 1   . lansoprazole (PREVACID) 30 MG capsule Take 1 capsule (30 mg total) by mouth 2 (two) times daily before a meal. NEEDS VISIT FOR FURTHER REFILLS!!!!!! 180 capsule 0   . lisinopril-hydrochlorothiazide (ZESTORETIC) 10-12.5 MG tablet TAKE 1 TABLET BY MOUTH EVERY DAY (Patient taking differently: Take 1 tablet by mouth daily. ) 90 tablet 1   . meloxicam (MOBIC) 15 MG tablet Take 15 mg by mouth daily.     . Multiple Vitamin (MULTIVITAMIN) tablet Take 1 tablet by mouth daily.     . rosuvastatin (CRESTOR) 10 MG tablet TAKE 1 TABLET BY MOUTH EVERY DAY (Patient taking differently: Take 10 mg by mouth daily. ) 90 tablet 3   . sucralfate (CARAFATE) 1 g tablet AVOID MORNING DOSE, AND TAKE AT LUNCH, DINNER AND BEDTIME (Patient taking differently: Take 1 g by mouth See admin instructions. Avoid morning dose, and take at lunch, dinner and bedtime.) 270 tablet 3   . tamsulosin (FLOMAX) 0.4 MG CAPS capsule TAKE 1 CAPSULE BY MOUTH DAILY AT NIGHT. (Patient taking differently: Take 0.4 mg by mouth at bedtime. ) 90 capsule 3     Patient Stressors: Loss of wife  Patient Strengths: Ability  for insight Average or above average intelligence Capable of independent living Communication skills Financial means General fund of knowledge Motivation for treatment/growth Supportive family/friends Work skills  Treatment Modalities: Medication Management, Group therapy, Case management,  1 to 1 session with clinician, Psychoeducation, Recreational therapy.   Physician Treatment Plan for Primary Diagnosis: <principal problem not specified> Long Term Goal(s): Improvement in symptoms so as ready for discharge Improvement in symptoms so as ready for discharge   Short Term Goals: Ability to identify changes in lifestyle to reduce recurrence of condition will improve Ability to verbalize feelings will improve Ability to disclose and discuss suicidal ideas Ability to demonstrate self-control will  improve Ability to identify and develop effective coping behaviors will improve Ability to maintain clinical measurements within normal limits will improve Ability to identify changes in lifestyle to reduce recurrence of condition will improve Ability to verbalize feelings will improve Ability to disclose and discuss suicidal ideas Ability to demonstrate self-control will improve Ability to identify and develop effective coping behaviors will improve Ability to maintain clinical measurements within normal limits will improve  Medication Management: Evaluate patient's response, side effects, and tolerance of medication regimen.  Therapeutic Interventions: 1 to 1 sessions, Unit Group sessions and Medication administration.  Evaluation of Outcomes: Progressing  Physician Treatment Plan for Secondary Diagnosis: Active Problems:   MDD (major depressive disorder), severe ([place])  Long Term Goal(s): Improvement in symptoms so as ready for discharge Improvement in symptoms so as ready for discharge   Short Term Goals: Ability to identify changes in lifestyle to reduce recurrence of condition will improve Ability to verbalize feelings will improve Ability to disclose and discuss suicidal ideas Ability to demonstrate self-control will improve Ability to identify and develop effective coping behaviors will improve Ability to maintain clinical measurements within normal limits will improve Ability to identify changes in lifestyle to reduce recurrence of condition will improve Ability to verbalize feelings will improve Ability to disclose and discuss suicidal ideas Ability to demonstrate self-control will improve Ability to identify and develop effective coping behaviors will improve Ability to maintain clinical measurements within normal limits will improve     Medication Management: Evaluate patient's response, side effects, and tolerance of medication regimen.  Therapeutic Interventions: 1  to 1 sessions, Unit Group sessions and Medication administration.  Evaluation of Outcomes: Progressing   RN Treatment Plan for Primary Diagnosis: <principal problem not specified> Long Term Goal(s): Knowledge of disease and therapeutic regimen to maintain health will improve  Short Term Goals: Ability to participate in decision making will improve, Ability to verbalize feelings will improve, Ability to disclose and discuss suicidal ideas, Ability to identify and develop effective coping behaviors will improve and Compliance with prescribed medications will improve  Medication Management: RN will administer medications as ordered by provider, will assess and evaluate patient's response and provide education to patient for prescribed medication. RN will report any adverse and/or side effects to prescribing provider.  Therapeutic Interventions: 1 on 1 counseling sessions, Psychoeducation, Medication administration, Evaluate responses to treatment, Monitor vital signs and CBGs as ordered, Perform/monitor CIWA, COWS, AIMS and Fall Risk screenings as ordered, Perform wound care treatments as ordered.  Evaluation of Outcomes: Progressing   LCSW Treatment Plan for Primary Diagnosis: <principal problem not specified> Long Term Goal(s): Safe transition to appropriate next level of care at discharge, Engage patient in therapeutic group addressing interpersonal concerns.  Short Term Goals: Engage patient in aftercare planning with referrals and resources and Increase skills for wellness and recovery  Therapeutic  Interventions: Assess for all discharge needs, 1 to 1 time with Education officer, museum, Explore available resources and support systems, Assess for adequacy in community support network, Educate family and significant other(s) on suicide prevention, Complete Psychosocial Assessment, Interpersonal group therapy.  Evaluation of Outcomes: Progressing   Progress in Treatment: Attending groups:  Yes. Participating in groups: Yes. Taking medication as prescribed: Yes. Toleration medication: Yes. Family/Significant other contact made: No, will contact:  will contact if given consent to contact Patient understands diagnosis: Yes. Discussing patient identified problems/goals with staff: Yes. Medical problems stabilized or resolved: Yes. Denies suicidal/homicidal ideation: Yes. Issues/concerns per patient self-inventory: No. Other:   New problem(s) identified: No, Describe:  None  New Short Term/Long Term Goal(s): Medication stabilization, elimination of SI thoughts, and development of a comprehensive mental wellness plan.   Patient Goals:    Discharge Plan or Barriers: Patient will be discharging home and will follow up with aftercare at [facility] doing the Partial Hospitalization Program (PHP).   Reason for Continuation of Hospitalization: Anxiety Medication stabilization  Estimated Length of Stay: 2-3 days   Attendees: Patient: 04/13/2019  Physician: Dr. Myles Lipps, MD 04/13/2019   Nursing: Lurline Hare 04/13/2019  RN Care Manager: 04/13/2019   Social Worker: Ardelle Anton, LCSW 04/13/2019   Recreational Therapist:  04/13/2019   Other:  04/13/2019   Other:  04/13/2019   Other: 04/13/2019      Scribe for Treatment Team: Trecia Rogers, LCSW 04/13/2019 2:56 PM

## 2019-04-13 NOTE — [facility]HH Group Notes (Signed)
Occupational Therapy Group Note  Date:  04/13/2019 Time:  11:34 AM  Group Topic/Focus:  Self Esteem Action Plan:   The focus of this group is to help patients create a plan to continue to build self-esteem after discharge.  Participation Level:  Active  Participation Quality:  Appropriate  Affect:  Blunted; Anxious  Cognitive:  Appropriate  Insight: Improving  Engagement in Group:  Engaged  Modes of Intervention:  Activity, Discussion, Education and Socialization  Additional Comments:    S: My self esteem is higher than it was a week ago  O: OT tx with focus on self esteem building this date. Education given on definition of self esteem, with both causes of low and high self esteem identified. Activity given for pt to identify a positive/aspiring trait for each letter of the alphabet. Pt to work with peers to help complete activity and build positive thinking.   A: Pt presents with blunted/anxious affect, engaged and participatory throughout session. Pt shares that unmet expectations in himself and family can decrease self esteem, while meditation and having a hobby can increase his self esteem. Pt shares that he is mourning the loss of his spouse, and feels lost in regards to doing finances and other IADL management that he has not had to do before this role change. Pt asking OT many questions and seeing if he can qualify for OT services. Reached out to LCSW for referral to PHP to continue follow up on independent life skills.  P: OT group will be x1 per week while pt inpatient.    Zenovia Jarred, MSOT, OTR/L Behavioral Health OT/ Acute Relief OT PHP Office: [place] 04/13/2019, 11:34 AM

## 2019-04-13 NOTE — Progress Notes (Addendum)
Adult Psychoeducational Group Note  Date:  04/13/2019 Time:  12:29 AM  Group Topic/Focus:  Wrap-Up Group:   The focus of this group is to help patients review their daily goal of treatment and discuss progress on daily workbooks.  Participation Level:  Active  Participation Quality:  Appropriate  Affect  Approriate  Cognitive:  Appropriate  Insight: Appropriate  Engagement in Group:  Engaged  Modes of Intervention:  Discussion  Additional Comments:  Pt stated his goal was to get some quality sleep and have a day without anxiety.  Pt stated he was able to reach that goal by coming out his room and interacting with other patients.  Pt stated he rated his day at a 9/10.  Gregg Smith 04/13/2019, 12:29 AM

## 2019-04-13 NOTE — Plan of Care (Signed)
Progress note  D: pt found in bed; compliant with medication administration. Pt states he didn't sleep very well. Pt states he didn't ask for his mouth guard. Pt denies any physical pain. Pt states he had more energy and felt less groggy after going to recreation. Pt's speech continues to be pressure and dominating at times, and actions are fidgety/restless. Pt also enjoys being referred to "Dr." if possible. Pt continues to have questions regarding his medications, and this seems to be a large part of his angst. Pt denies si/hi/ah/vh and verbally agrees to approach staff if these become apparent or before harming himself/others while at [place]: Pt provided support and encouragement. Pt given medication per protocol and standing orders. Q66m safety checks implemented and continued.  R: Pt safe on the unit. Will continue to monitor.  Pt progressing in the following metrics  Problem: Education: Goal: Knowledge of [place] [facility] Education information/materials will improve Outcome: Progressing Goal: Emotional status will improve Outcome: Progressing Goal: Mental status will improve Outcome: Progressing Goal: Verbalization of understanding the information provided will improve Outcome: Progressing

## 2019-04-13 NOTE — Progress Notes (Signed)
[facility] MD Progress Note  04/13/2019 11:06 AM AAREN ATALLAH  MRN:  [id number] Subjective: Patient is a 73 year old male with a past psychiatric history significant for insomnia, worsening anxiety as well as panic attacks.  He was admitted on 04/11/2019 and admitted secondary to worsening symptoms as well as suicidal ideation.  Objective: Patient is seen and examined.  Patient is a 73 year old male with the above-stated past psychiatric history who is seen in follow-up.  He is essentially unchanged from yesterday.  His anxiety may be slightly lower.  He has taken the Klonopin, and that seems to have helped to a degree.  Unfortunately he again did not sleep well last night.  Nursing notes reflect that he only slept 4.75 hours this morning.  We discussed options in terms of treatment.  He denied any suicidal ideation today.  He remains anxious and fearful over not sleeping.  His psychomotor agitation has decreased, and his pressured speech has also decreased to a certain degree.  Review of his laboratories revealed a mildly low potassium at 3.3.  A mildly elevated glucose at 111.  His CBC was completely normal.  His TSH was normal at 0.861.  His vital signs are stable, he is afebrile.  Principal Problem: <principal problem not specified> Diagnosis: Active Problems:   MDD (major depressive disorder), severe (HCC)  Total Time spent with patient: 20 minutes  Past Psychiatric History: See admission H&P  Past Medical History:  Past Medical History:  Diagnosis Date  . Allergy    seasonal   . Anxiety   . Arthritis   . Barrett's esophagus   . Cataract   . Chicken pox   . Colon polyps    hyperplastic  . Diverticulosis   . GERD (gastroesophageal reflux disease)   . Hemorrhoids   . Hyperlipidemia   . Hypertension   . Leg cramps   . Measles   . Mumps   . Pneumonia   . Rhinitis   . Sleep apnea    dental device  . Snoring disorder 05/09/2013    Past Surgical History:  Procedure Laterality Date   . APPENDECTOMY  1957  . CATARACT EXTRACTION    . COLONOSCOPY    . JOINT REPLACEMENT    . TONSILLECTOMY    . [place]  . 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 UPPER GASTROINTESTINAL ENDOSCOPY     Family History:  Family History  Problem Relation Age of Onset  . Alzheimer's disease Mother   . Cancer Father   . Stomach cancer Father   . Aortic aneurysm Son   . Heart disease Son   . Cancer Maternal Grandmother   . Heart disease Maternal Grandfather   . Stomach cancer Paternal Grandfather   . Colon cancer Neg Hx   . Colon polyps Neg Hx   . Esophageal cancer Neg Hx   . Rectal cancer Neg Hx   . Pancreatic cancer Neg Hx   . Liver cancer Neg Hx    Family Psychiatric  History: See admission H&P Social History:  Social History   Substance and Sexual Activity  Alcohol Use Yes  . Alcohol/week: 4.0 standard drinks  . Types: 4 Cans of beer per week     Social History   Substance and Sexual Activity  Drug Use No    Social History   Socioeconomic History  .  Marital status: Widowed    Spouse name: [name]  . Number of children: 1  . Years of education: PHD  . Highest education level: Not on file  Occupational History    Comment: Professor,[employer] scheme manager    Employer: [employer]  Social Needs  . Financial resource strain: Not on file  . Food insecurity    Worry: Not on file    Inability: Not on file  . Transportation needs    Medical: Not on file    Non-medical: Not on file  Tobacco Use  . Smoking status: Former Smoker    Packs/day: 1.00    Years: 40.00    Pack years: 40.00    Types: Cigarettes    Quit date: 01/19/2007    Years since quitting: 12.2  . Smokeless tobacco: Never Used  Substance and Sexual Activity  . Alcohol use: Yes    Alcohol/week: 4.0 standard drinks    Types: 4 Cans of  beer per week  . Drug use: No  . Sexual activity: Yes    Birth control/protection: None  Lifestyle  . Physical activity    Days per week: Not on file    Minutes per session: Not on file  . Stress: Not on file  Relationships  . Social Herbalist on phone: Not on file    Gets together: Not on file    Attends religious service: Not on file    Active member of club or organization: Not on file    Attends meetings of clubs or organizations: Not on file    Relationship status: Not on file  Other Topics Concern  . 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   Patient is working full-time.   Patient has a Ph.D   Patient has one adult child.   Patient is right-handed.   Patient drinks two cups of tea daily.   Additional Social History:    Pain Medications: See MAR Prescriptions: See MAR Over the Counter: See MAR History of alcohol / drug use?: No history of alcohol / drug abuse                    Sleep: Poor  Appetite:  Fair  Current Medications: Current Facility-Administered Medications  Medication Dose Route Frequency Provider Last Rate Last Dose  . busPIRone (BUSPAR) tablet 7.5 mg  7.5 mg Oral TID Sharma Covert, MD   7.5 mg at 04/13/19 0801  . clonazePAM (KLONOPIN) tablet 0.5 mg  0.5 mg Oral TID PRN Sharma Covert, MD   0.5 mg at 04/13/19 0801  . gabapentin (NEURONTIN) capsule 400 mg  400 mg Oral TID Sharma Covert, MD   400 mg at 04/13/19 0801  . lisinopril (ZESTRIL) tablet 10 mg  10 mg Oral QHS Minda Ditto, RPH       And  . [START ON 04/14/2019] hydrochlorothiazide (MICROZIDE) capsule 12.5 mg  12.5 mg Oral Daily Minda Ditto, RPH      . meloxicam (MOBIC) tablet 15 mg  15 mg Oral Daily Rankin, Shuvon B, NP   15 mg at 04/13/19 1030  . multivitamin with minerals tablet 1 tablet  1 tablet Oral Daily Rankin, Shuvon B, NP   1 tablet at 04/13/19 1030  . pantoprazole (PROTONIX) EC tablet  40 mg  40 mg Oral Daily Rankin, Shuvon B, NP   40  mg at 04/13/19 1031  . QUEtiapine (SEROQUEL) tablet 200 mg  200 mg Oral QHS Sharma Covert, MD      . rosuvastatin (CRESTOR) tablet 10 mg  10 mg Oral Daily Rankin, Shuvon B, NP   10 mg at 04/13/19 1031  . sucralfate (CARAFATE) tablet 1 g  1 g Oral 3 times per day Rankin, Shuvon B, NP   1 g at 04/12/19 2150  . tamsulosin (FLOMAX) capsule 0.4 mg  0.4 mg Oral QHS Rankin, Shuvon B, NP   0.4 mg at 04/12/19 2150  . traZODone (DESYREL) tablet 50 mg  50 mg Oral QHS PRN Rankin, Shuvon B, NP   50 mg at 04/12/19 2150    Lab Results:  Results for orders placed or performed during the hospital encounter of 04/11/19 (from the past 48 hour(s))  CBC     Status: None   Collection Time: 04/11/19  6:24 PM  Result Value Ref Range   WBC 8.7 4.0 - 10.5 K/uL   RBC 5.30 4.22 - 5.81 MIL/uL   Hemoglobin 15.1 13.0 - 17.0 g/dL   HCT 45.9 39.0 - 52.0 %   MCV 86.6 80.0 - 100.0 fL   MCH 28.5 26.0 - 34.0 pg   MCHC 32.9 30.0 - 36.0 g/dL   RDW 14.6 11.5 - 15.5 %   Platelets 221 150 - 400 K/uL   nRBC 0.0 0.0 - 0.2 %    Comment: Performed at [facility], [place] [address] [place], [place] [address]  Comprehensive metabolic panel     Status: Abnormal   Collection Time: 04/11/19  6:24 PM  Result Value Ref Range   Sodium 137 135 - 145 mmol/L   Potassium 3.2 (L) 3.5 - 5.1 mmol/L   Chloride 106 98 - 111 mmol/L   CO2 22 22 - 32 mmol/L   Glucose, Bld 125 (H) 70 - 99 mg/dL   BUN 20 8 - 23 mg/dL   Creatinine, Ser 0.98 0.61 - 1.24 mg/dL   Calcium 8.7 (L) 8.9 - 10.3 mg/dL   Total Protein 7.4 6.5 - 8.1 g/dL   Albumin 4.1 3.5 - 5.0 g/dL   AST 35 15 - 41 U/L   ALT 29 0 - 44 U/L   Alkaline Phosphatase 75 38 - 126 U/L   Total Bilirubin 0.8 0.3 - 1.2 mg/dL   GFR calc non Af Amer >60 >60 mL/min   GFR calc Af Amer >60 >60 mL/min   Anion gap 9 5 - 15    Comment: Performed at [facility], [place] [address], [place], [place] [address]   Hemoglobin A1c     Status: None   Collection Time: 04/11/19  6:24 PM  Result Value Ref Range   Hgb A1c MFr Bld 5.3 4.8 - 5.6 %    Comment: (NOTE) Pre diabetes:          5.7%-6.4% Diabetes:              >6.4% Glycemic control for   <7.0% adults with diabetes    Mean Plasma Glucose 105.41 mg/dL    Comment: Performed at [place] [address], [place], [place] [address]  Lipid panel     Status: Abnormal   Collection Time: 04/11/19  6:24 PM  Result Value Ref Range   Cholesterol 155 0 - 200 mg/dL   Triglycerides 114 <150 mg/dL   HDL 39 (L) >40 mg/dL   Total CHOL/HDL Ratio 4.0 RATIO  VLDL 23 0 - 40 mg/dL   LDL Cholesterol 93 0 - 99 mg/dL    Comment:        Total Cholesterol/HDL:CHD Risk Coronary Heart Disease Risk Table                     Men   Women  1/2 Average Risk   3.4   3.3  Average Risk       5.0   4.4  2 X Average Risk   9.6   7.1  3 X Average Risk  23.4   11.0        Use the calculated Patient Ratio above and the CHD Risk Table to determine the patient's CHD Risk.        ATP III CLASSIFICATION (LDL):  <100     mg/dL   Optimal  100-129  mg/dL   Near or Above                    Optimal  130-159  mg/dL   Borderline  160-189  mg/dL   High  >190     mg/dL   Very High Performed at [facility] [address], [place], [place] [address]   Urinalysis, Complete w Microscopic     Status: None   Collection Time: 04/11/19  6:57 PM  Result Value Ref Range   Color, Urine YELLOW YELLOW   APPearance CLEAR CLEAR   Specific Gravity, Urine 1.010 1.005 - 1.030   pH 5.0 5.0 - 8.0   Glucose, UA NEGATIVE NEGATIVE mg/dL   Hgb urine dipstick NEGATIVE NEGATIVE   Bilirubin Urine NEGATIVE NEGATIVE   Ketones, ur NEGATIVE NEGATIVE mg/dL   Protein, ur NEGATIVE NEGATIVE mg/dL   Nitrite NEGATIVE NEGATIVE   Leukocytes,Ua NEGATIVE NEGATIVE   RBC / HPF 0-5 0 - 5 RBC/hpf   WBC, UA 0-5 0 - 5 WBC/hpf   Bacteria, UA NONE SEEN NONE SEEN   Mucus PRESENT      Comment: Performed at [facility], [place] [address], [place], [place] [address]  Urine rapid drug screen (hosp performed)not at [facility]     Status: None   Collection Time: 04/11/19  6:57 PM  Result Value Ref Range   Opiates NONE DETECTED NONE DETECTED   Cocaine NONE DETECTED NONE DETECTED   Benzodiazepines NONE DETECTED NONE DETECTED   Amphetamines NONE DETECTED NONE DETECTED   Tetrahydrocannabinol NONE DETECTED NONE DETECTED   Barbiturates NONE DETECTED NONE DETECTED    Comment: (NOTE) DRUG SCREEN FOR MEDICAL PURPOSES ONLY.  IF CONFIRMATION IS NEEDED FOR ANY PURPOSE, NOTIFY LAB WITHIN 5 DAYS. LOWEST DETECTABLE LIMITS FOR URINE DRUG SCREEN Drug Class                     Cutoff (ng/mL) Amphetamine and metabolites    1000 Barbiturate and metabolites    200 Benzodiazepine                 673 Tricyclics and metabolites     300 Opiates and metabolites        300 Cocaine and metabolites        300 THC                            50 Performed at [facility], [place] [address] [place], [place] [address]   SARS Coronavirus 2 [facility] order, Performed in [facility]  hospital lab)     Status: None   Collection Time: 04/11/19 11:00 PM  Result Value Ref Range   SARS Coronavirus 2 NEGATIVE NEGATIVE    Comment: (NOTE) If result is NEGATIVE SARS-CoV-2 target nucleic acids are NOT DETECTED. The SARS-CoV-2 RNA is generally detectable in upper and lower  respiratory specimens during the acute phase of infection. The lowest  concentration of SARS-CoV-2 viral copies this assay can detect is 250  copies / mL. A negative result does not preclude SARS-CoV-2 infection  and should not be used as the sole basis for treatment or other  patient management decisions.  A negative result may occur with  improper specimen collection / handling, submission of specimen other  than nasopharyngeal swab, presence of viral mutation(s) within the  areas targeted by this assay, and  inadequate number of viral copies  (<250 copies / mL). A negative result must be combined with clinical  observations, patient history, and epidemiological information. If result is POSITIVE SARS-CoV-2 target nucleic acids are DETECTED. The SARS-CoV-2 RNA is generally detectable in upper and lower  respiratory specimens dur ing the acute phase of infection.  Positive  results are indicative of active infection with SARS-CoV-2.  Clinical  correlation with patient history and other diagnostic information is  necessary to determine patient infection status.  Positive results do  not rule out bacterial infection or co-infection with other viruses. If result is PRESUMPTIVE POSTIVE SARS-CoV-2 nucleic acids MAY BE PRESENT.   A presumptive positive result was obtained on the submitted specimen  and confirmed on repeat testing.  While 2019 novel coronavirus  (SARS-CoV-2) nucleic acids may be present in the submitted sample  additional confirmatory testing may be necessary for epidemiological  and / or clinical management purposes  to differentiate between  SARS-CoV-2 and other Sarbecovirus currently known to infect humans.  If clinically indicated additional testing with an alternate test  methodology [phone]) is advised. The SARS-CoV-2 RNA is generally  detectable in upper and lower respiratory sp ecimens during the acute  phase of infection. The expected result is Negative. Fact Sheet for Patients:  StrictlyIdeas.no Fact Sheet for Healthcare Providers: BankingDealers.co.za This test is not yet approved or cleared by the [place] FDA and has been authorized for detection and/or diagnosis of SARS-CoV-2 by FDA under an Emergency Use Authorization (EUA).  This EUA will remain in effect (meaning this test can be used) for the duration of the COVID-19 declaration under Section 564(b)(1) of the Act, 21 U.S.C. section 360bbb-3(b)(1), unless the  authorization is terminated or revoked sooner. Performed at [facility], [place] [address], [place], [place] [address]   Comprehensive metabolic panel     Status: Abnormal   Collection Time: 04/12/19  6:05 PM  Result Value Ref Range   Sodium 140 135 - 145 mmol/L   Potassium 3.3 (L) 3.5 - 5.1 mmol/L   Chloride 109 98 - 111 mmol/L   CO2 22 22 - 32 mmol/L   Glucose, Bld 111 (H) 70 - 99 mg/dL   BUN 21 8 - 23 mg/dL   Creatinine, Ser 1.03 0.61 - 1.24 mg/dL   Calcium 8.5 (L) 8.9 - 10.3 mg/dL   Total Protein 6.9 6.5 - 8.1 g/dL   Albumin 3.7 3.5 - 5.0 g/dL   AST 31 15 - 41 U/L   ALT 26 0 - 44 U/L   Alkaline Phosphatase 66 38 - 126 U/L   Total Bilirubin 0.6 0.3 - 1.2 mg/dL   GFR calc  non Af Amer >60 >60 mL/min   GFR calc Af Amer >60 >60 mL/min   Anion gap 9 5 - 15    Comment: Performed at [facility], [address], [place], [place] [address]  Ethanol     Status: None   Collection Time: 04/12/19  6:05 PM  Result Value Ref Range   Alcohol, Ethyl (B) <10 <10 mg/dL    Comment: (NOTE) Lowest detectable limit for serum alcohol is 10 mg/dL. For medical purposes only. Performed at [facility], [place] [address], [place], [place] [address]   TSH     Status: None   Collection Time: 04/12/19  6:05 PM  Result Value Ref Range   TSH 0.861 0.350 - 4.500 uIU/mL    Comment: Performed by a 3rd Generation assay with a functional sensitivity of <=0.01 uIU/mL. Performed at [facility], [place] [address] [place], [place] [address]     Blood Alcohol level:  Lab Results  Component Value Date   ETH <10 91/47/8295    Metabolic Disorder Labs: Lab Results  Component Value Date   HGBA1C 5.3 04/11/2019   MPG 105.41 04/11/2019   MPG 111 09/05/2015   No results found for: PROLACTIN Lab Results  Component Value Date   CHOL 155 04/11/2019   TRIG 114 04/11/2019   HDL 39 (L) 04/11/2019   CHOLHDL 4.0 04/11/2019   VLDL 23  04/11/2019   LDLCALC 93 04/11/2019   LDLCALC 97 08/03/2018    Physical Findings: AIMS:  , ,  ,  ,    CIWA:    COWS:     Musculoskeletal: Strength & Muscle Tone: within normal limits Gait & Station: normal Patient leans: N/A  Psychiatric Specialty Exam: Physical Exam  Nursing note and vitals reviewed. Constitutional: He is oriented to person, place, and time. He appears well-developed and well-nourished.  HENT:  Head: Normocephalic and atraumatic.  Respiratory: Effort normal.  Neurological: He is alert and oriented to person, place, and time.    ROS  Blood pressure 129/84, pulse 72, temperature 97.8 F (36.6 C), resp. rate 20, height 6' (1.829 m), weight 112 kg, SpO2 95 %.Body mass index is 33.5 kg/m.  General Appearance: Casual  Eye Contact:  Good  Speech:  Pressured  Volume:  Normal  Mood:  Anxious  Affect:  Congruent  Thought Process:  Coherent and Descriptions of Associations: Intact  Orientation:  Full (Time, Place, and Person)  Thought Content:  Logical  Suicidal Thoughts:  No  Homicidal Thoughts:  No  Memory:  Immediate;   Good Recent;   Good Remote;   Good  Judgement:  Intact  Insight:  Fair  Psychomotor Activity:  Increased  Concentration:  Concentration: Fair and Attention Span: Fair  Recall:  [employer] of Knowledge:  Good  Language:  Good  Akathisia:  Negative  Handed:  Right  AIMS (if indicated):     Assets:  Communication Skills Desire for Improvement Financial Resources/Insurance Housing Resilience  ADL's:  Intact  Cognition:  WNL  Sleep:  Number of Hours: 4.75     Treatment Plan Summary: Daily contact with patient to assess and evaluate symptoms and progress in treatment, Medication management and Plan : Patient is seen and examined.  Patient is a 73 year old male with the above-stated past psychiatric history who is seen in follow-up.   Diagnosis: #1 generalized anxiety disorder, #2 panic disorder, #3 insomnia, #4 hypertension, #5  sciatic nerve pain, #6 peripheral neuropathy, #7 concern  for possible bipolar disorder.  Patient is seen and examined.  Patient is a 73 year old male with the above-stated past psychiatric history who is seen in follow-up.  He is perhaps slightly less anxious than yesterday.  He had just received the Klonopin shortly before our interview.  I am going to leave the rest of his medications alone except for his Seroquel.  It was written for 25 mg p.o. twice daily as needed yesterday.  After discussion with him and the chronicity of his sleep disorder I am going to put him on 200 mg p.o. nightly.  We will also continue the as needed trazodone if necessary.  His lab work-up was essentially negative.  Hopefully we can get him some sleep. 1.  Increase BuSpar to 10 mg p.o. 3 times daily for anxiety. 2.  Continue clonazepam 0.5 mg p.o. 3 times daily as needed anxiety. 3.  Continue Neurontin 400 mg p.o. 3 times daily for anxiety, mood stability and chronic pain. 4.  Continue Zestril 10 mg p.o. nightly for hypertension. 5.  Continue hydrochlorothiazide 12.5 mg p.o. daily for hypertension. 6.  Continue meloxicam 15 mg p.o. daily for osteoarthritic pain. 7.  Continue multivitamin 1 tablet p.o. daily for nutritional supplementation. 8.  Continue Protonix 40 mg p.o. daily for gastric protection. 9.  Change Seroquel to 200 mg p.o. nightly for insomnia and mood stability. 10.  Continue Crestor 10 mg p.o. daily for hyperlipidemia. 11.  Continue Carafate 1 g p.o. 3 times daily for gastric ulcer disease. 12.  Continue Flomax 0.4 mg p.o. nightly for BPH. 13.  Continue trazodone 50 mg p.o. nightly as needed insomnia. 14.  Disposition planning-in progress.  Sharma Covert, MD 04/13/2019, 11:06 AM

## 2019-04-13 NOTE — [facility] Counselor (Signed)
Adult Comprehensive Assessment  Patient ID: Gregg Smith, male   DOB: 1945/10/30, 73 y.o.   MRN: [id number]  Information Source: Information source: Patient  Current Stressors:  Patient states their primary concerns and needs for treatment are:: "Anxiety attacks and insomnia" Patient states their goals for this hospitilization and ongoing recovery are:: "Eliminae the anxiety and not have insomnia" Educational / Learning stressors: None Employment / Job issues: None Family Relationships: Pt reports his wife passed away suddenly in 08-01-23 Financial / Lack of resources (include bankruptcy): None Housing / Lack of housing: Stable housing Physical health (include injuries & life threatening diseases): Sciatica Social relationships: "Interact quite abit with former grad students, neighbors, church" Substance abuse: Pt reports drinks 3 beers per week. No drug use reported Bereavement / Loss: Pt reports wife deceased in 07-31-2018 Living/Environment/Situation:  Living Arrangements: Alone Living conditions (as described by patient or guardian): "Lonely" Who else lives in the home?: NA How long has patient lived in current situation?: Since 1996 What is atmosphere in current home: Comfortable  Family History:  Marital status: Widowed Widowed, when?: Jul 31, 2018 Are you sexually active?: No What is your sexual orientation?: Heterosexual Has your sexual activity been affected by drugs, alcohol, medication, or emotional stress?: No Does patient have children?: Yes How many children?: 1 How is patient's relationship with their children?: Pt reports having a son states the relationship is "generally good and sometime critical"  Childhood History:  By whom was/is the patient raised?: Both parents Additional childhood history information: NA Description of patient's relationship with caregiver when they were a child: "Normal" Patient's description of current relationship with  people who raised him/her: Parents deceased How were you disciplined when you got in trouble as a child/adolescent?: "Spankings, extra work" Does patient have siblings?: Yes Number of Siblings: 4 Description of patient's current relationship with siblings: Pt reports having 4 sisters who live in the [place] and [place] Did patient suffer any verbal/emotional/physical/sexual abuse as a child?: No Did patient suffer from severe childhood neglect?: No Has patient ever been sexually abused/assaulted/raped as an adolescent or adult?: No Was the patient ever a victim of a crime or a disaster?: Yes Patient description of being a victim of a crime or disaster: Phishing scam Witnessed domestic violence?: No Has patient been effected by domestic violence as an adult?: No  Education:  Highest grade of school patient has completed: 2 PHD's Currently a student?: No Learning disability?: No  Employment/Work Situation:   Employment situation: Employed Where is patient currently employed?: [employer] How long has patient been employed?: 38 yrs Patient's job has been impacted by current illness: Yes Describe how patient's job has been impacted: "In July my ability to perform went down" What is the longest time patient has a held a job?: 37yrs Where was the patient employed at that time?: [employer] Did You Receive Any Psychiatric Treatment/Services While in the [employer]?: No Are There Guns or Other Weapons in [place]?: Yes Types of Guns/Weapons: [place] gun Are These Psychologist, educational?: (Pt says the weapon is secure)  Pensions consultant:   Financial resources: Income from employment Does patient have a representative payee or guardian?: No  Alcohol/Substance Abuse:   What has been your use of drugs/alcohol within the last 12 months?: 3 beers per week If attempted suicide, did drugs/alcohol play a role in this?: No Alcohol/Substance Abuse Treatment Hx: Denies past history If yes,  describe treatment: NA Has alcohol/substance abuse ever caused legal problems?: No  Social Support System:   Patient's Community Support System: Good Describe Community Support System: Son, neighbors, pastor Type of faith/religion: Methodist How does patient's faith help to cope with current illness?: "Faith in the future"  Leisure/Recreation:   Leisure and Hobbies: "Group zoom calls with students, gardening  Strengths/Needs:   What is the patient's perception of their strengths?: "Agricultural consultant, program well, good researcher Patient states they can use these personal strengths during their treatment to contribute to their recovery: "Im not sure" Patient states these barriers may affect/interfere with their treatment: None Patient states these barriers may affect their return to the community: None Other important information patient would like considered in planning for their treatment: NA  Discharge Plan:   Currently receiving community mental health services: No Patient states concerns and preferences for aftercare planning are: Pt says he would like to be referred to the mood treatment center Patient states they will know when they are safe and ready for discharge when: "Sleep every night soundly" Does patient have access to transportation?: Yes Does patient have financial barriers related to discharge medications?: No Patient description of barriers related to discharge medications: NA Will patient be returning to same living situation after discharge?: Yes  Summary/Recommendations:   Summary and Recommendations (to be completed by the evaluator): Pt is a 73 yr old male brought to the hospital for worsening anxiety and insomnia. Pt has a diagnosis of major depressive disorder. Pt reports his wife passed away suddenly in 2018/08/14 and is having to adjust to this recent loss. Pt denies any drug use but reports drinking 3 beers per week. 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 While here, patient will benefit from crisis stabilization, medication evaluation, group therapy and psychoeducation. In addition, it is recommended that patient remain compliant with the established discharge plan and continue treatment.  Kemisha Bonnette Lynelle Smoke. 04/13/2019

## 2019-04-14 ENCOUNTER — Encounter: Payer: BC Managed Care – PPO | Admitting: Internal Medicine

## 2019-04-14 MED ORDER — QUETIAPINE FUMARATE 50 MG PO TABS
150.0000 mg | ORAL_TABLET | Freq: Every day | ORAL | Status: DC
  Administered 2019-04-14: 150 mg via ORAL
  Filled 2019-04-14 (×3): qty 3

## 2019-04-14 NOTE — Progress Notes (Signed)
The patient's positivie event for the day is that he slept well last evening and feels "reasonablely well". The patient also mentioned that he had a good visit with his son today. His goal for tomorrow is to have another decent day.

## 2019-04-14 NOTE — Progress Notes (Signed)
[facility] MD Progress Note  04/14/2019 9:33 AM Gregg Smith  MRN:  [id number] Subjective:  Patient is a 73 year old male with a past psychiatric history significant for insomnia, worsening anxiety as well as panic attacks.  He was admitted on 04/11/2019 and admitted secondary to worsening symptoms as well as suicidal ideation.  Objective: Patient is seen and examined.  Patient is a 73 year old male with the above-stated past psychiatric history is seen in follow-up.  He is doing better today.  He slept over 6 hours last night.  He is a little oversedated this morning.  He denied side effects of the Seroquel outside of the mild oversedation.  His blood pressure this morning is lower than it had been, and I am sure that is because he was able to sleep.  His anxiety has decreased.  His blood pressure this morning is 134/84, oxygen saturations 99% on room air.  He is afebrile.  No new laboratories.  He denied any suicidal ideation this morning.  Principal Problem: <principal problem not specified> Diagnosis: Active Problems:   MDD (major depressive disorder), severe (HCC)  Total Time spent with patient: 15 minutes  Past Psychiatric History: See admission H&P  Past Medical History:  Past Medical History:  Diagnosis Date  . Allergy    seasonal   . Anxiety   . Arthritis   . Barrett's esophagus   . Cataract   . Chicken pox   . Colon polyps    hyperplastic  . Diverticulosis   . GERD (gastroesophageal reflux disease)   . Hemorrhoids   . Hyperlipidemia   . Hypertension   . Leg cramps   . Measles   . Mumps   . Pneumonia   . Rhinitis   . Sleep apnea    dental device  . Snoring disorder 05/09/2013    Past Surgical History:  Procedure Laterality Date  . APPENDECTOMY  1957  . CATARACT EXTRACTION    . COLONOSCOPY    . JOINT REPLACEMENT    . TONSILLECTOMY    . [place]  . 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 UPPER GASTROINTESTINAL ENDOSCOPY     Family History:  Family History  Problem Relation Age of Onset  . Alzheimer's disease Mother   . Cancer Father   . Stomach cancer Father   . Aortic aneurysm Son   . Heart disease Son   . Cancer Maternal Grandmother   . Heart disease Maternal Grandfather   . Stomach cancer Paternal Grandfather   . Colon cancer Neg Hx   . Colon polyps Neg Hx   . Esophageal cancer Neg Hx   . Rectal cancer Neg Hx   . Pancreatic cancer Neg Hx   . Liver cancer Neg Hx    Family Psychiatric  History: See admission H&P Social History:  Social History   Substance and Sexual Activity  Alcohol Use Yes  . Alcohol/week: 4.0 standard drinks  . Types: 4 Cans of beer per week     Social History   Substance and Sexual Activity  Drug Use No    Social History   Socioeconomic History  . Marital status: Widowed    Spouse name: [name]  . Number of children: 1  . Years of education: PHD  . Highest education level: Not on file  Occupational History    Comment: Professor,[employer]    Employer: [employer]  Social Needs  . Financial resource strain: Not on file  . Food insecurity    Worry: Not on file    Inability: Not on file  . Transportation needs    Medical: Not on file    Non-medical: Not on file  Tobacco Use  . Smoking status: Former Smoker    Packs/day: 1.00    Years: 40.00    Pack years: 40.00    Types: Cigarettes    Quit date: 01/19/2007    Years since quitting: 12.2  . Smokeless tobacco: Never Used  Substance and Sexual Activity  . Alcohol use: Yes    Alcohol/week: 4.0 standard drinks    Types: 4 Cans of beer per week  . Drug use: No  . Sexual activity: Yes    Birth control/protection: None  Lifestyle  . Physical activity    Days per week: Not on file    Minutes per session: Not on file  . Stress: Not on file  Relationships  . Social  Herbalist on phone: Not on file    Gets together: Not on file    Attends religious service: Not on file    Active member of club or organization: Not on file    Attends meetings of clubs or organizations: Not on file    Relationship status: Not on file  Other Topics Concern  . 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   Patient is working full-time.   Patient has a Ph.D   Patient has one adult child.   Patient is right-handed.   Patient drinks two cups of tea daily.   Additional Social History:    Pain Medications: See MAR Prescriptions: See MAR Over the Counter: See MAR History of alcohol / drug use?: No history of alcohol / drug abuse                    Sleep: Good  Appetite:  Fair  Current Medications: Current Facility-Administered Medications  Medication Dose Route Frequency Provider Last Rate Last Dose  . busPIRone (BUSPAR) tablet 10 mg  10 mg Oral TID Sharma Covert, MD   10 mg at 04/14/19 0801  . clonazePAM (KLONOPIN) tablet 0.5 mg  0.5 mg Oral TID PRN Sharma Covert, MD   0.5 mg at 04/13/19 0801  . gabapentin (NEURONTIN) capsule 400 mg  400 mg Oral TID Sharma Covert, MD   400 mg at 04/14/19 0801  . lisinopril (ZESTRIL) tablet 10 mg  10 mg Oral QHS Minda Ditto, RPH   10 mg at 04/13/19 2254   And  . hydrochlorothiazide (MICROZIDE) capsule 12.5 mg  12.5 mg Oral Daily Minda Ditto, RPH   12.5 mg at 04/14/19 0801  . meloxicam (MOBIC) tablet 15 mg  15 mg Oral Daily Rankin, Shuvon B, NP   15 mg at 04/14/19 0801  . multivitamin with minerals tablet 1 tablet  1 tablet Oral Daily Rankin, Shuvon B, NP   1 tablet at 04/14/19 0801  . pantoprazole (PROTONIX) EC tablet 40 mg  40 mg Oral Daily Rankin, Shuvon B, NP   40 mg at 04/14/19 0801  . QUEtiapine (SEROQUEL) tablet 150 mg  150 mg Oral QHS Sharma Covert, MD      . rosuvastatin (CRESTOR) tablet 10 mg  10 mg Oral Daily  Rankin,  Shuvon B, NP   10 mg at 04/14/19 0801  . sucralfate (CARAFATE) tablet 1 g  1 g Oral 3 times per day Rankin, Shuvon B, NP   1 g at 04/13/19 2254  . tamsulosin (FLOMAX) capsule 0.4 mg  0.4 mg Oral QHS Rankin, Shuvon B, NP   0.4 mg at 04/13/19 2254  . traZODone (DESYREL) tablet 50 mg  50 mg Oral QHS PRN Rankin, Shuvon B, NP   50 mg at 04/12/19 2150    Lab Results:  Results for orders placed or performed during the hospital encounter of 04/11/19 (from the past 48 hour(s))  Comprehensive metabolic panel     Status: Abnormal   Collection Time: 04/12/19  6:05 PM  Result Value Ref Range   Sodium 140 135 - 145 mmol/L   Potassium 3.3 (L) 3.5 - 5.1 mmol/L   Chloride 109 98 - 111 mmol/L   CO2 22 22 - 32 mmol/L   Glucose, Bld 111 (H) 70 - 99 mg/dL   BUN 21 8 - 23 mg/dL   Creatinine, Ser 1.03 0.61 - 1.24 mg/dL   Calcium 8.5 (L) 8.9 - 10.3 mg/dL   Total Protein 6.9 6.5 - 8.1 g/dL   Albumin 3.7 3.5 - 5.0 g/dL   AST 31 15 - 41 U/L   ALT 26 0 - 44 U/L   Alkaline Phosphatase 66 38 - 126 U/L   Total Bilirubin 0.6 0.3 - 1.2 mg/dL   GFR calc non Af Amer >60 >60 mL/min   GFR calc Af Amer >60 >60 mL/min   Anion gap 9 5 - 15    Comment: Performed at [facility], [place] [address] [place], [place] [address]  Ethanol     Status: None   Collection Time: 04/12/19  6:05 PM  Result Value Ref Range   Alcohol, Ethyl (B) <10 <10 mg/dL    Comment: (NOTE) Lowest detectable limit for serum alcohol is 10 mg/dL. For medical purposes only. Performed at [facility], [place] [address] [place], [place] [address]   TSH     Status: None   Collection Time: 04/12/19  6:05 PM  Result Value Ref Range   TSH 0.861 0.350 - 4.500 uIU/mL    Comment: Performed by a 3rd Generation assay with a functional sensitivity of <=0.01 uIU/mL. Performed at [facility], [place] [address] [place],  [address]     Blood Alcohol level:  Lab Results  Component Value Date   ETH  <10 15/17/6160    Metabolic Disorder Labs: Lab Results  Component Value Date   HGBA1C 5.3 04/11/2019   MPG 105.41 04/11/2019   MPG 111 09/05/2015   No results found for: PROLACTIN Lab Results  Component Value Date   CHOL 155 04/11/2019   TRIG 114 04/11/2019   HDL 39 (L) 04/11/2019   CHOLHDL 4.0 04/11/2019   VLDL 23 04/11/2019   LDLCALC 93 04/11/2019   LDLCALC 97 08/03/2018    Physical Findings: AIMS:  , ,  ,  ,    CIWA:    COWS:     Musculoskeletal: Strength & Muscle Tone: within normal limits Gait & Station: normal Patient leans: N/A  Psychiatric Specialty Exam: Physical Exam  Nursing note and vitals reviewed. Constitutional: He is oriented to person, place, and time. He appears well-developed and well-nourished.  HENT:  Head: Normocephalic and atraumatic.  Respiratory: Effort normal.  Neurological: He is alert and oriented to person, place, and time.  ROS  Blood pressure 134/84, pulse 71, temperature 97.6 F (36.4 C), temperature source Oral, resp. rate 20, height 6' (1.829 m), weight 112 kg, SpO2 99 %.Body mass index is 33.5 kg/m.  General Appearance: Casual  Eye Contact:  Fair  Speech:  Normal Rate  Volume:  Normal  Mood:  Euthymic  Affect:  Congruent  Thought Process:  Coherent and Descriptions of Associations: Intact  Orientation:  Full (Time, Place, and Person)  Thought Content:  Logical  Suicidal Thoughts:  No  Homicidal Thoughts:  No  Memory:  Immediate;   Fair Recent;   Fair Remote;   Good  Judgement:  Intact  Insight:  Good  Psychomotor Activity:  Decreased  Concentration:  Concentration: Fair and Attention Span: Fair  Recall:  [employer] of Knowledge:  Fair  Language:  Fair  Akathisia:  Negative  Handed:  Right  AIMS (if indicated):     Assets:  Communication Skills Desire for Improvement Financial Resources/Insurance Housing Resilience Talents/Skills Transportation  ADL's:  Intact  Cognition:  WNL  Sleep:  Number of Hours:  6.25     Treatment Plan Summary: Daily contact with patient to assess and evaluate symptoms and progress in treatment, Medication management and Plan : Patient is seen and examined.  Patient is a 73 year old male with the above-stated past psychiatric history who is seen in follow-up.   Diagnosis: #1 generalized anxiety disorder, #2 panic disorder, #3 insomnia, #4 hypertension, #5 sciatic nerve pain, #6 peripheral neuropathy, #7 concern for possible bipolar disorder.  Patient is seen in follow-up.  He is doing better today.  Most that is because of the sleep issue.  He does feel overmedicated with the 200 mg of Seroquel, and I am going to reduce that 250 mg p.o. nightly.  No other changes in his medicines.  Hopefully he will continue to improve. 1.    Continue BuSpar to 10 mg p.o. 3 times daily for anxiety. 2.  Continue clonazepam 0.5 mg p.o. 3 times daily as needed anxiety. 3.  Continue Neurontin 400 mg p.o. 3 times daily for anxiety, mood stability and chronic pain. 4.  Continue Zestril 10 mg p.o. nightly for hypertension. 5.  Continue hydrochlorothiazide 12.5 mg p.o. daily for hypertension. 6.  Continue meloxicam 15 mg p.o. daily for osteoarthritic pain. 7.  Continue multivitamin 1 tablet p.o. daily for nutritional supplementation. 8.  Continue Protonix 40 mg p.o. daily for gastric protection. 9.    Decrease Seroquel to 150 mg p.o. nightly for insomnia and mood stability. 10.  Continue Crestor 10 mg p.o. daily for hyperlipidemia. 11.  Continue Carafate 1 g p.o. 3 times daily for gastric ulcer disease. 12.  Continue Flomax 0.4 mg p.o. nightly for BPH. 13.  Continue trazodone 50 mg p.o. nightly as needed insomnia. 14.  Disposition planning-in progress.  Sharma Covert, MD 04/14/2019, 9:33 AM

## 2019-04-14 NOTE — Progress Notes (Signed)
D: Pt was in dayroom upon initial approach.  Pt presents with anxious affect and mood.  He reports his day was "okay" and states "they gave me new sleeping stuff last night and this morning I felt really dizzy and foggy.  I've been anxious but I haven't had to take anything for it here."  Pt reports he has been using coping skills for anxiety.  Pt denies SI/HI, denies hallucinations, denies pain.  Pt has been visible in milieu interacting with peers and staff appropriately.  Pt attended evening group.    A: Introduced self to pt.  Met with pt 1:1.  Actively listened to pt and offered support and encouragement.  Medications administered per order.  15 minute safety checks place.  R: Pt is safe on the unit.  Pt is compliant with medications.  Pt verbally contracts for safety.  Will continue to monitor and assess.

## 2019-04-14 NOTE — [facility] Suicide Risk Assessment (Signed)
[facility] INPATIENT:  Family/Significant Other Suicide Prevention Education  Suicide Prevention Education:  Education Completed; Karon Heckendorn, son, [phone] has been identified by the patient as the family member/significant other with whom the patient will be residing, and identified as the person(s) who will aid the patient in the event of a mental health crisis (suicidal ideations/suicide attempt).  With written consent from the patient, the family member/significant other has been provided the following suicide prevention education, prior to the and/or following the discharge of the patient.  The suicide prevention education provided includes the following:  Suicide risk factors  Suicide prevention and interventions  National Suicide Hotline telephone number  [facility] assessment telephone number  [facility] Emergency Assistance [place] and/or Residential Mobile Crisis Unit telephone number  Request made of family/significant other to:  Remove weapons (e.g., guns, rifles, knives), all items previously/currently identified as safety concern.    Remove drugs/medications (over-the-counter, prescriptions, illicit drugs), all items previously/currently identified as a safety concern.  The family member/significant other verbalizes understanding of the suicide prevention education information provided.  The family member/significant other agrees to remove the items of safety concern listed above.  Son has no specific concerns for suicide or self-harm. He wanted to ensure that his father is stabilized on medications and able to get quality sleep. We discussed that the patient would be referred to an outpatient therapist and medication provider.  No additional questions or concerns for CSW.  Joellen Jersey 04/14/2019, 2:51 PM

## 2019-04-15 MED ORDER — QUETIAPINE FUMARATE 100 MG PO TABS
100.0000 mg | ORAL_TABLET | Freq: Every day | ORAL | Status: DC
  Administered 2019-04-15: 22:00:00 100 mg via ORAL
  Filled 2019-04-15 (×3): qty 1

## 2019-04-15 MED ORDER — GABAPENTIN 300 MG PO CAPS
300.0000 mg | ORAL_CAPSULE | Freq: Three times a day (TID) | ORAL | Status: DC
  Administered 2019-04-15 – 2019-04-16 (×4): 300 mg via ORAL
  Filled 2019-04-15 (×9): qty 1

## 2019-04-15 NOTE — Progress Notes (Signed)
[facility] MD Progress Note  04/15/2019 9:34 AM Gregg Smith  MRN:  [id number] Subjective:  Patient is a 73 year old male with a past psychiatric history significant for insomnia, worsening anxiety as well as panic attacks. He was admitted on 04/11/2019 and admitted secondary to worsening symptoms as well as suicidal ideation.  Objective: Patient is seen and examined.  Patient is a 73 year old male with the above-stated past psychiatric history who is seen in follow-up.  He again last night slept well, but has had some word searching problems, and that may be related to the gabapentin or the Seroquel.  We discussed potentially decreasing both of those.  He is still quite concerned about how he is going to return home.  He stated that he still having some grief issues with regard to the death of his wife.  We have held off on any antidepressant medication because of the concern that if he had bipolar disorder it might flip him into a manic phase.  He stated he did feel drugged and lethargic this morning.  And we discussed reducing those medications.  He denied any suicidal or homicidal ideation.  He denied any panic attacks since he is been in the hospital.  He is mildly anxious this morning. His vital signs are stable.  He is afebrile.  He slept 6.75 hours last night. Principal Problem: <principal problem not specified> Diagnosis: Active Problems:   MDD (major depressive disorder), severe (HCC)  Total Time spent with patient: 15 minutes  Past Psychiatric History: See admission H&P  Past Medical History:  Past Medical History:  Diagnosis Date  . Allergy    seasonal   . Anxiety   . Arthritis   . Barrett's esophagus   . Cataract   . Chicken pox   . Colon polyps    hyperplastic  . Diverticulosis   . GERD (gastroesophageal reflux disease)   . Hemorrhoids   . Hyperlipidemia   . Hypertension   . Leg cramps   . Measles   . Mumps   . Pneumonia   . Rhinitis   . Sleep apnea    dental device   . Snoring disorder 05/09/2013    Past Surgical History:  Procedure Laterality Date  . APPENDECTOMY  1957  . CATARACT EXTRACTION    . COLONOSCOPY    . JOINT REPLACEMENT    . TONSILLECTOMY    . [place]  . 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 UPPER GASTROINTESTINAL ENDOSCOPY     Family History:  Family History  Problem Relation Age of Onset  . Alzheimer's disease Mother   . Cancer Father   . Stomach cancer Father   . Aortic aneurysm Son   . Heart disease Son   . Cancer Maternal Grandmother   . Heart disease Maternal Grandfather   . Stomach cancer Paternal Grandfather   . Colon cancer Neg Hx   . Colon polyps Neg Hx   . Esophageal cancer Neg Hx   . Rectal cancer Neg Hx   . Pancreatic cancer Neg Hx   . Liver cancer Neg Hx    Family Psychiatric  History: See admission H&P Social History:  Social History   Substance and Sexual Activity  Alcohol Use Yes  . Alcohol/week: 4.0 standard drinks  . Types: 4 Cans of beer per week     Social History  Substance and Sexual Activity  Drug Use No    Social History   Socioeconomic History  . Marital status: Widowed    Spouse name: [name]  . Number of children: 1  . Years of education: PHD  . Highest education level: Not on file  Occupational History    Comment: Professor,[employer] scheme manager    Employer: [employer]  Social Needs  . Financial resource strain: Not on file  . Food insecurity    Worry: Not on file    Inability: Not on file  . Transportation needs    Medical: Not on file    Non-medical: Not on file  Tobacco Use  . Smoking status: Former Smoker    Packs/day: 1.00    Years: 40.00    Pack years: 40.00    Types: Cigarettes    Quit date: 01/19/2007    Years since quitting: 12.2  . Smokeless tobacco: Never Used  Substance and Sexual  Activity  . Alcohol use: Yes    Alcohol/week: 4.0 standard drinks    Types: 4 Cans of beer per week  . Drug use: No  . Sexual activity: Yes    Birth control/protection: None  Lifestyle  . Physical activity    Days per week: Not on file    Minutes per session: Not on file  . Stress: Not on file  Relationships  . Social Herbalist on phone: Not on file    Gets together: Not on file    Attends religious service: Not on file    Active member of club or organization: Not on file    Attends meetings of clubs or organizations: Not on file    Relationship status: Not on file  Other Topics Concern  . 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   Patient is working full-time.   Patient has a Ph.D   Patient has one adult child.   Patient is right-handed.   Patient drinks two cups of tea daily.   Additional Social History:    Pain Medications: See MAR Prescriptions: See MAR Over the Counter: See MAR History of alcohol / drug use?: No history of alcohol / drug abuse                    Sleep: Good  Appetite:  Fair  Current Medications: Current Facility-Administered Medications  Medication Dose Route Frequency Provider Last Rate Last Dose  . busPIRone (BUSPAR) tablet 10 mg  10 mg Oral TID Sharma Covert, MD   10 mg at 04/15/19 0820  . clonazePAM (KLONOPIN) tablet 0.5 mg  0.5 mg Oral TID PRN Sharma Covert, MD   0.5 mg at 04/13/19 0801  . gabapentin (NEURONTIN) capsule 300 mg  300 mg Oral TID Sharma Covert, MD      . lisinopril (ZESTRIL) tablet 10 mg  10 mg Oral QHS Minda Ditto, RPH   10 mg at 04/14/19 2214   And  . hydrochlorothiazide (MICROZIDE) capsule 12.5 mg  12.5 mg Oral Daily Minda Ditto, RPH   12.5 mg at 04/15/19 0820  . meloxicam (MOBIC) tablet 15 mg  15 mg Oral Daily Rankin, Shuvon B, NP   15 mg at 04/15/19 0821  . multivitamin with minerals tablet 1 tablet  1 tablet Oral Daily  Rankin, Shuvon B, NP   1 tablet at 04/15/19 0820  .  pantoprazole (PROTONIX) EC tablet 40 mg  40 mg Oral Daily Rankin, Shuvon B, NP   40 mg at 04/15/19 0821  . QUEtiapine (SEROQUEL) tablet 100 mg  100 mg Oral QHS Sharma Covert, MD      . rosuvastatin (CRESTOR) tablet 10 mg  10 mg Oral Daily Rankin, Shuvon B, NP   10 mg at 04/15/19 0821  . sucralfate (CARAFATE) tablet 1 g  1 g Oral 3 times per day Rankin, Shuvon B, NP   1 g at 04/14/19 2214  . tamsulosin (FLOMAX) capsule 0.4 mg  0.4 mg Oral QHS Rankin, Shuvon B, NP   0.4 mg at 04/14/19 2213  . traZODone (DESYREL) tablet 50 mg  50 mg Oral QHS PRN Rankin, Shuvon B, NP   50 mg at 04/12/19 2150    Lab Results: No results found for this or any previous visit (from the past 48 hour(s)).  Blood Alcohol level:  Lab Results  Component Value Date   ETH <10 51/88/4166    Metabolic Disorder Labs: Lab Results  Component Value Date   HGBA1C 5.3 04/11/2019   MPG 105.41 04/11/2019   MPG 111 09/05/2015   No results found for: PROLACTIN Lab Results  Component Value Date   CHOL 155 04/11/2019   TRIG 114 04/11/2019   HDL 39 (L) 04/11/2019   CHOLHDL 4.0 04/11/2019   VLDL 23 04/11/2019   LDLCALC 93 04/11/2019   LDLCALC 97 08/03/2018    Physical Findings: AIMS:  , ,  ,  ,    CIWA:    COWS:     Musculoskeletal: Strength & Muscle Tone: within normal limits Gait & Station: normal Patient leans: N/A  Psychiatric Specialty Exam: Physical Exam  Nursing note and vitals reviewed. Constitutional: He is oriented to person, place, and time. He appears well-developed and well-nourished.  HENT:  Head: Normocephalic and atraumatic.  Respiratory: Effort normal.  Neurological: He is alert and oriented to person, place, and time.    ROS  Blood pressure 128/82, pulse 74, temperature 98.7 F (37.1 C), temperature source Oral, resp. rate 20, height 6' (1.829 m), weight 112 kg, SpO2 99 %.Body mass index is 33.5 kg/m.  General Appearance: Disheveled   Eye Contact:  Fair  Speech:  Normal Rate  Volume:  Normal  Mood:  Anxious  Affect:  Congruent  Thought Process:  Coherent and Descriptions of Associations: Intact  Orientation:  Full (Time, Place, and Person)  Thought Content:  Logical  Suicidal Thoughts:  No  Homicidal Thoughts:  No  Memory:  Immediate;   Fair Recent;   Fair Remote;   Fair  Judgement:  Intact  Insight:  Fair  Psychomotor Activity:  Normal  Concentration:  Concentration: Fair and Attention Span: Good  Recall:  [employer] of Knowledge:  Fair  Language:  Fair  Akathisia:  Negative  Handed:  Right  AIMS (if indicated):     Assets:  Desire for Improvement Resilience  ADL's:  Intact  Cognition:  WNL  Sleep:  Number of Hours: 6.75     Treatment Plan Summary: Daily contact with patient to assess and evaluate symptoms and progress in treatment, Medication management and Plan : Patient is seen and examined.  Patient is a 73 year old male with the above-stated past psychiatric history who is seen in follow-up.  Diagnosis: #1 generalized anxiety disorder, #2 panic disorder, #3 insomnia, #4 hypertension, #5 sciatic nerve pain, #6 peripheral neuropathy, #7 concern for possible bipolar disorder.  Patient is seen in  follow-up.  He is doing better with regard to sleep, but is having some side effects from the current medications.  I will decrease his Neurontin at 300 mg p.o. 3 times daily, and also decrease his Seroquel to 100 mg p.o. nightly.  No other changes in his medications at this point.  Hopefully this will go well today and his word searching and sleep will improve. 1.   Continue BuSpar to 10 mg p.o. 3 times daily for anxiety. 2. Continue clonazepam 0.5 mg p.o. 3 times daily as needed anxiety. 3. Decrease Neurontin to 300 mg p.o. 3 times daily for anxiety, mood stability and chronic pain. 4. Continue Zestril 10 mg p.o. nightly for hypertension. 5. Continue hydrochlorothiazide 12.5 mg p.o. daily for  hypertension. 6. Continue meloxicam 15 mg p.o. daily for osteoarthritic pain. 7. Continue multivitamin 1 tablet p.o. daily for nutritional supplementation. 8. Continue Protonix 40 mg p.o. daily for gastric protection. 9.   Decrease Seroquel to 100 mg p.o. nightly for insomnia and mood stability. 10. Continue Crestor 10 mg p.o. daily for hyperlipidemia. 11. Continue Carafate 1 g p.o. 3 times daily for gastric ulcer disease. 12. Continue Flomax 0.4 mg p.o. nightly for BPH. 13. Continue trazodone 50 mg p.o. nightly as needed insomnia. 14. Disposition planning-in progress.  Sharma Covert, MD 04/15/2019, 9:34 AM

## 2019-04-15 NOTE — Progress Notes (Signed)
D: Pt was in dayroom upon initial approach.  Pt presents with anxious affect and mood.  He describes his day as "pretty good" and his goal is to "sleep well."  Pt states he was told "I get out tomorrow" and pt reports feeling safe to discharge.  Pt denies SI/HI, denies hallucinations, denies pain.  Pt has been visible in milieu interacting with peers and staff appropriately.    A: Introduced self to pt.  Met with pt 1:1.  Actively listened to pt and offered support and encouragement.  Medications administered per order.  15 minute safety checks in place.  R: Pt is safe on the unit.  Pt is compliant with medications.  Pt verbally contracts for safety.  Will continue to monitor and assess.

## 2019-04-16 DIAGNOSIS — F411 Generalized anxiety disorder: Secondary | ICD-10-CM

## 2019-04-16 DIAGNOSIS — G4700 Insomnia, unspecified: Secondary | ICD-10-CM

## 2019-04-16 DIAGNOSIS — F410 Panic disorder [episodic paroxysmal anxiety] without agoraphobia: Secondary | ICD-10-CM

## 2019-04-16 MED ORDER — TRAZODONE HCL 50 MG PO TABS: 50.0000 mg | ORAL_TABLET | Freq: Every evening | ORAL | 0 refills | Status: DC | PRN

## 2019-04-16 MED ORDER — QUETIAPINE FUMARATE 100 MG PO TABS: 100.0000 mg | ORAL_TABLET | Freq: Every day | ORAL | 0 refills | Status: DC

## 2019-04-16 MED ORDER — TRAZODONE HCL 50 MG PO TABS
50.0000 mg | ORAL_TABLET | Freq: Every evening | ORAL | Status: DC | PRN
  Filled 2019-04-16 (×2): qty 1

## 2019-04-16 MED ORDER — BUSPIRONE HCL 10 MG PO TABS: 10.0000 mg | ORAL_TABLET | Freq: Three times a day (TID) | ORAL | 0 refills | Status: DC

## 2019-04-16 MED ORDER — GABAPENTIN 300 MG PO CAPS: 300.0000 mg | ORAL_CAPSULE | Freq: Three times a day (TID) | ORAL | 0 refills | Status: DC

## 2019-04-16 NOTE — [facility] Group Notes (Signed)
LCSW Group Therapy Note  04/16/2019    10:00-11:00am   Type of Therapy and Topic:  Group Therapy: Early Messages Received About Anger  Participation Level:  Active   Description of Group:   In this group, patients shared and discussed the early messages received in their lives about anger through parental or other adult modeling, teaching, repression, punishment, violence, and more.  Participants identified how those childhood lessons influence even now how they usually or often react when angered.  The group discussed that anger is a secondary emotion and what may be the underlying emotional themes that come out through anger outbursts or that are ignored through anger suppression.  Finally, as a group there was a conversation about the workbook's quote that "There is nothing wrong with anger; it is just a sign something needs to change."     Therapeutic Goals: 1. Patients will identify one or more childhood message about anger that they received and how it was taught to them. 2. Patients will discuss how these childhood experiences have influenced and continue to influence their own expression or repression of anger even today. 3. Patients will explore possible primary emotions that tend to fuel their secondary emotion of anger. 4. Patients will learn that anger itself is normal and cannot be eliminated, and that healthier coping skills can assist with resolving conflict rather than worsening situations.  Summary of Patient Progress:  The patient was not present for the first part of group during which patients shared the messages they got during early life about anger.  However, once he arrived in group he participated frequently and appropriately for the remainder of group.  Therapeutic Modalities:   Cognitive Behavioral Therapy Motivation Interviewing  Maretta Los  .

## 2019-04-16 NOTE — Progress Notes (Signed)
Pt denies SI/HI. Pt received both written and verbal discharge instructions. Pt verbalized understanding of discharge instructions. Pt agreed to f/u appt and med regimen. Pt received d/c packet (AVS, SRA, transitional record, & prescriptions). Pt verbalized readiness for discharge this afternoon.   [place] NOVEL CORONAVIRUS (COVID-19) DAILY CHECK-OFF SYMPTOMS - answer yes or no to each - every day NO YES  Have you had a fever in the past 24 hours?  . Fever (Temp > 37.80C / 100F) X   Have you had any of these symptoms in the past 24 hours? . New Cough .  Sore Throat  .  Shortness of Breath .  Difficulty Breathing .  Unexplained Body Aches   X   Have you had any one of these symptoms in the past 24 hours not related to allergies?   . Runny Nose .  Nasal Congestion .  Sneezing   X   If you have had runny nose, nasal congestion, sneezing in the past 24 hours, has it worsened?  X   EXPOSURES - check yes or no X   Have you traveled outside the state in the past 14 days?  X   Have you been in contact with someone with a confirmed diagnosis of COVID-19 or PUI in the past 14 days without wearing appropriate PPE?  X   Have you been living in the same home as a person with confirmed diagnosis of COVID-19 or a PUI (household contact)?    X   Have you been diagnosed with COVID-19?    X              What to do next: Answered NO to all: Answered YES to anything:   Proceed with unit schedule Follow the [facility] Inpatient Flowsheet.

## 2019-04-16 NOTE — Discharge Summary (Addendum)
Physician Discharge Summary Note  Patient:  Gregg Smith is an 73 y.o., male MRN:  [id number] DOB:  1945-09-16 Patient phone:  [phone] (home)  Patient address:   [address] [place] [address],  Total Time spent with patient: 15 minutes  Date of Admission:  04/11/2019 Date of Discharge: 04/16/2019  Reason for Admission:  Per assessment note: Gregg Smith is an 73 y.o. male patient presents as walk in with complaints or worsening anxiety and depression over the last several months.  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  Now having panic attacks and afraid to be in home.  Denies suicidal/self-harm/homicidal ideation, psychosis, and paranoia but states he is afraid to be in home.    Principal Problem: MDD (major depressive disorder), severe ([place]) Discharge Diagnoses: Principal Problem:   MDD (major depressive disorder), severe ([place]) Active Problems:   Generalized anxiety disorder   Panic disorder   Insomnia   Past Psychiatric History:   Past Medical History:  Past Medical History:  Diagnosis Date  . Allergy    seasonal   . Anxiety   . Arthritis   . Barrett's esophagus   . Cataract   . Chicken pox   . Colon polyps    hyperplastic  . Diverticulosis   . GERD (gastroesophageal reflux disease)   . Hemorrhoids   . Hyperlipidemia   . Hypertension   . Leg cramps   . Measles   . Mumps   . Pneumonia   . Rhinitis   . Sleep apnea    dental device  . Snoring disorder 05/09/2013    Past Surgical History:  Procedure Laterality Date  . APPENDECTOMY  1957  . CATARACT EXTRACTION    . COLONOSCOPY    . JOINT REPLACEMENT    . TONSILLECTOMY    . [place]  . 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 UPPER GASTROINTESTINAL ENDOSCOPY     Family History:  Family History  Problem Relation Age of Onset  . Alzheimer's disease Mother   . Cancer Father   . Stomach cancer Father   . Aortic aneurysm Son   . Heart disease Son   . Cancer Maternal Grandmother   . Heart disease Maternal Grandfather   . Stomach cancer Paternal Grandfather   . Colon cancer Neg Hx   . Colon polyps Neg Hx   . Esophageal cancer Neg Hx   . Rectal cancer Neg Hx   . Pancreatic cancer Neg Hx   . Liver cancer Neg Hx    Family Psychiatric  History:  Social History:  Social History   Substance and Sexual Activity  Alcohol Use Yes  . Alcohol/week: 4.0 standard drinks  . Types: 4 Cans of beer per week     Social History   Substance and Sexual Activity  Drug Use No    Social History   Socioeconomic History  . Marital status: Widowed    Spouse name: [name]  . Number of children: 1  . Years of education: PHD  . Highest education level: Not on file  Occupational History    Comment: [employer]  Employer: [employer]  Social Needs  . Financial resource strain: Not on file  . Food insecurity    Worry: Not on file    Inability: Not on file  . Transportation needs    Medical: Not on file    Non-medical: Not on file  Tobacco Use  . Smoking status: Former Smoker    Packs/day: 1.00    Years: 40.00    Pack years: 40.00    Types: Cigarettes    Quit date: 01/19/2007    Years since quitting: 12.2  . Smokeless tobacco: Never Used  Substance and Sexual Activity  . Alcohol use: Yes    Alcohol/week: 4.0 standard drinks    Types: 4 Cans of beer per week  . Drug use: No  . Sexual activity: Yes    Birth control/protection: None  Lifestyle  . Physical activity    Days per week: Not on file    Minutes per session: Not on file  . Stress: Not on file  Relationships  . Social Product manager on phone: Not on file    Gets together: Not on file    Attends religious service: Not on file    Active member of club or organization: Not on file    Attends meetings of clubs or organizations: Not on file    Relationship status: Not on file  Other Topics Concern  . 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   Patient is working full-time.   Patient has a Ph.D   Patient has one adult child.   Patient is right-handed.   Patient drinks two cups of tea daily.     Hospital Course:  Gregg Smith was admitted for MDD (major depressive disorder), severe ([place]) and crisis management.  Pt was treated discharged with the medications listed below under Medication List.  Medical problems were identified and treated as needed.  Home medications were restarted as appropriate.  Improvement was monitored by observation and Gregg Smith 's daily report of symptom reduction.  Emotional and mental status was monitored by daily self-inventory reports completed by Gregg Smith and clinical staff.         Gregg Smith was evaluated by the treatment team for stability and plans for continued recovery upon discharge. Gregg Smith 's motivation was an integral factor for scheduling further treatment. Employment, transportation, bed availability, health status, family support, and any pending legal issues were also considered during hospital stay. Pt was offered further treatment options upon discharge including but not limited to Residential, Intensive Outpatient, and Outpatient treatment.  Gregg Smith will follow up with the services as listed below under Follow Up Information.     Upon completion of this admission the patient was both mentally and medically stable for discharge denying suicidal/homicidal ideation, auditory/visual/tactile hallucinations, delusional thoughts and paranoia.    Gregg Smith  responded well to treatment with Buspar 10 mg and Seroquel 100 mg and Neurontin 300mg   without adverse effects. Pt demonstrated improvement without reported or observed adverse effects to the point of stability appropriate for outpatient management. Pertinent labs include: CMP, CBC and potassium 3.3  for which outpatient follow-up is necessary for lab recheck as mentioned below. Reviewed CBC, CMP, BAL, and UDS; all unremarkable aside from noted exceptions.   Physical Findings: AIMS:  , ,  ,  ,    CIWA:  COWS:     Musculoskeletal: Strength & Muscle Tone: within normal limits Gait & Station: normal Patient leans: N/A  Psychiatric Specialty Exam: See SRA by MD  Physical Exam  Vitals reviewed. Constitutional: He is oriented to person, place, and time. He appears well-developed.  Neurological: He is alert and oriented to person, place, and time.  Psychiatric: He has a normal mood and affect. His behavior is normal.    Review of Systems  Psychiatric/Behavioral: Positive for depression. Negative for suicidal ideas. The patient is nervous/anxious.   All other systems reviewed and are negative.   Blood pressure 132/87, pulse 72, temperature 98.7 F (37.1 C), resp. rate 20, height 6' (1.829 m), weight 112 kg, SpO2 95 %.Body mass index is 33.5 kg/m.      Has this patient used any form of tobacco in the last 30 days? (Cigarettes, Smokeless Tobacco, Cigars, and/or Pipes)No  Blood Alcohol level:  Lab Results  Component Value Date   ETH <10 99/35/7017    Metabolic Disorder Labs:  Lab Results  Component Value Date   HGBA1C 5.3 04/11/2019   MPG 105.41 04/11/2019   MPG 111 09/05/2015   No results found for: PROLACTIN Lab Results  Component Value Date   CHOL 155 04/11/2019   TRIG 114 04/11/2019   HDL 39 (L) 04/11/2019   CHOLHDL 4.0 04/11/2019   VLDL 23 04/11/2019   LDLCALC 93 04/11/2019   LDLCALC 97 08/03/2018    See Psychiatric Specialty Exam and Suicide Risk Assessment  completed by Attending Physician prior to discharge.  Discharge destination:  Home  Is patient on multiple antipsychotic therapies at discharge:  Yes,   Do you recommend tapering to monotherapy for antipsychotics?  Yes   Has Patient had three or more failed trials of antipsychotic monotherapy by history:  No  Recommended Plan for Multiple Antipsychotic Therapies: NA  Discharge Instructions    Diet - low sodium heart healthy   Complete by: As directed    Discharge instructions   Complete by: As directed    Patient is instructed to take all prescribed medications as recommended. Report any side effects or adverse reactions to your outpatient psychiatrist. Patient is instructed to abstain from alcohol and illegal drugs while on prescription medications. In the event of worsening symptoms, patient is instructed to call the crisis hotline, 911, or go to the nearest emergency department for evaluation and treatment.   Discharge instructions   Complete by: As directed    Take all medications as prescribed. Keep all follow-up appointments as scheduled.  Do not consume alcohol or use illegal drugs while on prescription medications. Report any adverse effects from your medications to your primary care provider promptly.  In the event of recurrent symptoms or worsening symptoms, call 911, a crisis hotline, or go to the nearest emergency department for evaluation.   Increase activity slowly   Complete by: As directed      Allergies as of 04/16/2019      Reactions   Penicillins Hives, Rash   Has patient had a PCN reaction causing immediate rash, facial/tongue/throat swelling, SOB or lightheadedness with hypotension:unsure Has patient had a PCN reaction causing severe rash involving mucus membranes or skin necrosis:unsure Has patient had a PCN reaction that required hospitalization:No Has patient had a PCN reaction occurring within the last 10 years:No If all of the above answers are "NO",  then may proceed with Cephalosporin use. Childhood reaction      Medication List    TAKE these medications  Indication  busPIRone 10 MG tablet Commonly known as: BUSPAR Take 1 tablet (10 mg total) by mouth 3 (three) times daily. What changed:   medication strength  how much to take  when to take this  Indication: Anxiety Disorder   gabapentin 300 MG capsule Commonly known as: NEURONTIN Take 1 capsule (300 mg total) by mouth 3 (three) times daily.  Indication: Abuse or Misuse of Alcohol   lansoprazole 30 MG capsule Commonly known as: PREVACID Take 1 capsule (30 mg total) by mouth 2 (two) times daily before a meal. NEEDS VISIT FOR FURTHER REFILLS!!!!!!  Indication: Gastroesophageal Reflux Disease   lisinopril-hydrochlorothiazide 10-12.5 MG tablet Commonly known as: ZESTORETIC TAKE 1 TABLET BY MOUTH EVERY DAY  Indication: High Blood Pressure Disorder   meloxicam 15 MG tablet Commonly known as: MOBIC Take 15 mg by mouth daily.  Indication: Pain   multivitamin tablet Take 1 tablet by mouth daily.  Indication: Supplementation   QUEtiapine 100 MG tablet Commonly known as: SEROQUEL Take 1 tablet (100 mg total) by mouth at bedtime.  Indication: Generalized Anxiety Disorder   rosuvastatin 10 MG tablet Commonly known as: CRESTOR TAKE 1 TABLET BY MOUTH EVERY DAY  Indication: High Amount of Fats in the Blood   sucralfate 1 g tablet Commonly known as: CARAFATE AVOID MORNING DOSE, AND TAKE AT LUNCH, DINNER AND BEDTIME What changed: See the new instructions.  Indication: Ulcer of the Duodenum   tamsulosin 0.4 MG Caps capsule Commonly known as: FLOMAX TAKE 1 CAPSULE BY MOUTH DAILY AT NIGHT. What changed: See the new instructions.  Indication: Benign Enlargement of Prostate      Follow-up [facility] Follow up on 04/18/2019.   Why: Therapy with Michael Boston is Monday, 8/17 at 11:00a.  Medication management with Marni Griffon is  Tuesday, 8/18 at 11:30a.  Appointments will be virtual and an email will be sent to you with appointment information.  Contact information: [address] [place] [address] [phone]           Follow-up recommendations:  Activity:  as tolerated Diet:  heart healthy   Comments:  Take all medications as prescribed. Keep all follow-up appointments as scheduled.  Do not consume alcohol or use illegal drugs while on prescription medications. Report any adverse effects from your medications to your primary care provider promptly.  In the event of recurrent symptoms or worsening symptoms, call 911, a crisis hotline, or go to the nearest emergency department for evaluation.   Signed: Derrill Center, NP 04/16/2019, 9:51 AM

## 2019-04-16 NOTE — BHH Suicide Risk Assessment (Signed)
[facility] Discharge Suicide Risk Assessment   Principal Problem: <principal problem not specified> Discharge Diagnoses: Active Problems:   MDD (major depressive disorder), severe ([place])   Total Time spent with patient: 20 minutes  Musculoskeletal: Strength & Muscle Tone: within normal limits Gait & Station: normal Patient leans: N/A  Psychiatric Specialty Exam: Review of Systems  All other systems reviewed and are negative.   Blood pressure 132/87, pulse 72, temperature 98.7 F (37.1 C), resp. rate 20, height 6' (1.829 m), weight 112 kg, SpO2 95 %.Body mass index is 33.5 kg/m.  General Appearance: Casual  Eye Contact::  Fair  Speech:  Normal Rate409  Volume:  Normal  Mood:  Anxious  Affect:  Congruent  Thought Process:  Coherent and Descriptions of Associations: Intact  Orientation:  Full (Time, Place, and Person)  Thought Content:  Logical  Suicidal Thoughts:  No  Homicidal Thoughts:  No  Memory:  Immediate;   Good Recent;   Good Remote;   Good  Judgement:  Fair  Insight:  Fair  Psychomotor Activity:  Normal  Concentration:  Good  Recall:  Good  Fund of Knowledge:Good  Language: Good  Akathisia:  Negative  Handed:  Right  AIMS (if indicated):     Assets:  Communication Skills Desire for Improvement Financial Resources/Insurance Housing Resilience Talents/Skills Transportation Vocational/Educational  Sleep:  Number of Hours: 6.75  Cognition: WNL  ADL's:  Intact   Mental Status Per Nursing Assessment::   On Admission:  NA  Demographic Factors:  Male, Age 73 or older, Divorced or widowed, Caucasian and Living alone  Loss Factors: Loss of significant relationship  Historical Factors: Impulsivity  Risk Reduction Factors:   Employed  Continued Clinical Symptoms:  Severe Anxiety and/or Agitation Panic Attacks Chronic Pain More than one psychiatric diagnosis Medical Diagnoses and Treatments/Surgeries  Cognitive Features That Contribute To Risk:  None     Suicide Risk:  Minimal: No identifiable suicidal ideation.  Patients presenting with no risk factors but with morbid ruminations; may be classified as minimal risk based on the severity of the depressive symptoms  [place] Follow up on 04/18/2019.   Why: Therapy with Michael Boston is Monday, 8/17 at 11:00a.  Medication management with Marni Griffon is Tuesday, 8/18 at 11:30a.  Appointments will be virtual and an email will be sent to you with appointment information.  Contact information: [address] [place] [address] [phone]           Plan Of Care/Follow-up recommendations:  Activity:  ad lib  Sharma Covert, MD 04/16/2019, 8:17 AM

## 2019-04-16 NOTE — Progress Notes (Signed)
  [facility] Adult Case Management Discharge Plan :  Will you be returning to the same living situation after discharge:  Yes,  home alone At discharge, do you have transportation home?: Yes,  states he has transportation Do you have the ability to pay for your medications: Yes,  has income and insurance  Release of information consent forms completed and turned in to Medical Records by CSW.   Patient to Follow up at: [facility] Follow up on 04/18/2019.   Why: Therapy with Michael Boston is Monday, 8/17 at 11:00a.  Medication management with Marni Griffon is Tuesday, 8/18 at 11:30a.  Appointments will be virtual and an email will be sent to you with appointment information.  Contact information: [place] [address] [phone]           Next level of care provider has access to [place] and Suicide Prevention discussed: Yes,  with son     Has patient been referred to the Quitline?: N/A patient is not a smoker  Patient has been referred for addiction treatment: N/A  Maretta Los, LCSW 04/16/2019, 8:38 AM

## 2019-04-18 ENCOUNTER — Telehealth: Payer: Self-pay | Admitting: Emergency Medicine

## 2019-04-18 NOTE — Telephone Encounter (Signed)
MyChart msg  "I was hospitalized August 10-14 without a phone or computer. I found out about the appointment only yesterday. I'm sorry I mised the appointment, but there was no way I could have known about it. Please let Dr. Mitchel Honour know about my circumstances."

## 2019-04-19 ENCOUNTER — Other Ambulatory Visit (HOSPITAL_COMMUNITY): Payer: BC Managed Care – PPO

## 2019-04-19 ENCOUNTER — Other Ambulatory Visit: Payer: Self-pay

## 2019-04-22 DIAGNOSIS — M419 Scoliosis, unspecified: Secondary | ICD-10-CM | POA: Insufficient documentation

## 2019-04-22 DIAGNOSIS — M51369 Other intervertebral disc degeneration, lumbar region without mention of lumbar back pain or lower extremity pain: Secondary | ICD-10-CM | POA: Insufficient documentation

## 2019-04-22 DIAGNOSIS — M5136 Other intervertebral disc degeneration, lumbar region: Secondary | ICD-10-CM | POA: Insufficient documentation

## 2019-04-23 ENCOUNTER — Other Ambulatory Visit (HOSPITAL_COMMUNITY): Payer: Self-pay | Admitting: Family

## 2019-04-25 ENCOUNTER — Encounter: Payer: Self-pay | Admitting: *Deleted

## 2019-04-27 ENCOUNTER — Telehealth: Payer: Self-pay | Admitting: *Deleted

## 2019-04-27 NOTE — Telephone Encounter (Signed)
Covid-19 screening questions   Do you now or have you had a fever in the last 14 days?  no  Do you have any respiratory symptoms of shortness of breath or cough now or in the last 14 days? No  Do you have any family members or close contacts with diagnosed or suspected Covid-19 in the past 14 days? no  Have you been tested for Covid-19 and found to be positive? no

## 2019-04-28 ENCOUNTER — Encounter: Payer: Self-pay | Admitting: Internal Medicine

## 2019-04-28 ENCOUNTER — Other Ambulatory Visit: Payer: Self-pay

## 2019-04-28 ENCOUNTER — Ambulatory Visit (AMBULATORY_SURGERY_CENTER): Payer: BC Managed Care – PPO | Admitting: Internal Medicine

## 2019-04-28 VITALS — BP 132/76 | HR 59 | Temp 98.7°F | Resp 17 | Ht 71.0 in | Wt 257.0 lb

## 2019-04-28 DIAGNOSIS — K449 Diaphragmatic hernia without obstruction or gangrene: Secondary | ICD-10-CM

## 2019-04-28 DIAGNOSIS — K219 Gastro-esophageal reflux disease without esophagitis: Secondary | ICD-10-CM | POA: Diagnosis not present

## 2019-04-28 DIAGNOSIS — K22710 Barrett's esophagus with low grade dysplasia: Secondary | ICD-10-CM | POA: Diagnosis not present

## 2019-04-28 MED ORDER — SODIUM CHLORIDE 0.9 % IV SOLN: 500.0000 mL | Freq: Once | INTRAVENOUS | Status: DC

## 2019-04-28 NOTE — Progress Notes (Signed)
Temp by June Vitals by NS

## 2019-04-28 NOTE — Patient Instructions (Signed)
   Await biopsy results    Continue previous medications and usual diet   YOU HAD AN ENDOSCOPIC PROCEDURE TODAY AT [facility]:   Refer to the procedure report that was given to you for any specific questions about what was found during the examination.  If the procedure report does not answer your questions, please call your gastroenterologist to clarify.  If you requested that your care partner not be given the details of your procedure findings, then the procedure report has been included in a sealed envelope for you to review at your convenience later.  YOU SHOULD EXPECT: Some feelings of bloating in the abdomen. Passage of more gas than usual.  Walking can help get rid of the air that was put into your GI tract during the procedure and reduce the bloating. If you had a lower endoscopy (such as a colonoscopy or flexible sigmoidoscopy) you may notice spotting of blood in your stool or on the toilet paper. If you underwent a bowel prep for your procedure, you may not have a normal bowel movement for a few days.  Please Note:  You might notice some irritation and congestion in your nose or some drainage.  This is from the oxygen used during your procedure.  There is no need for concern and it should clear up in a day or so.  SYMPTOMS TO REPORT IMMEDIATELY:     Following upper endoscopy (EGD)  Vomiting of blood or coffee ground material  New chest pain or pain under the shoulder blades  Painful or persistently difficult swallowing  New shortness of breath  Fever of 100F or higher  Black, tarry-looking stools  For urgent or emergent issues, a gastroenterologist can be reached at any hour by calling [phone].   DIET:  We do recommend a small meal at first, but then you may proceed to your regular diet.  Drink plenty of fluids but you should avoid alcoholic beverages for 24 hours.  ACTIVITY:  You should plan to take it easy for the rest of today and you should NOT  DRIVE or use heavy machinery until tomorrow (because of the sedation medicines used during the test).    FOLLOW UP: Our staff will call the number listed on your records 48-72 hours following your procedure to check on you and address any questions or concerns that you may have regarding the information given to you following your procedure. If we do not reach you, we will leave a message.  We will attempt to reach you two times.  During this call, we will ask if you have developed any symptoms of COVID 19. If you develop any symptoms (ie: fever, flu-like symptoms, shortness of breath, cough etc.) before then, please call [phone].  If you test positive for Covid 19 in the 2 weeks post procedure, please call and report this information to [place].    If any biopsies were taken you will be contacted by phone or by letter within the next 1-3 weeks.  Please call us at [phone] if you have not heard about the biopsies in 3 weeks.    SIGNATURES/CONFIDENTIALITY: You and/or your care partner have signed paperwork which will be entered into your electronic medical record.  These signatures attest to the fact that that the information above on your After Visit Summary has been reviewed and is understood.  Full responsibility of the confidentiality of this discharge information lies with you and/or your care-partner.

## 2019-04-28 NOTE — Progress Notes (Signed)
Called to room to assist during endoscopic procedure.  Patient ID and intended procedure confirmed with present staff. Received instructions for my participation in the procedure from the performing physician.  

## 2019-04-28 NOTE — Progress Notes (Signed)
A/ox3, pleased with MAC, report to RN 

## 2019-04-28 NOTE — Op Note (Signed)
[place] Patient Name: Gregg Smith Procedure Date: 04/28/2019 1:32 PM MRN: [id number] Endoscopist: Docia Chuck. Henrene Pastor , MD Age: 73 Referring MD:  Date of Birth: [birth year] Gender: Male Account #: [id number] Procedure:                Upper GI endoscopy with biopsies Indications:              Follow-up of Barrett's esophagus Medicines:                Monitored Anesthesia Care Procedure:                Pre-Anesthesia Assessment:                           - Prior to the procedure, a History and Physical                            was performed, and patient medications and                            allergies were reviewed. The patient's tolerance of                            previous anesthesia was also reviewed. The risks                            and benefits of the procedure and the sedation                            options and risks were discussed with the patient.                            All questions were answered, and informed consent                            was obtained. Prior Anticoagulants: The patient has                            taken no previous anticoagulant or antiplatelet                            agents. ASA Grade Assessment: II - A patient with                            mild systemic disease. After reviewing the risks                            and benefits, the patient was deemed in                            satisfactory condition to undergo the procedure.                           After obtaining informed consent, the endoscope was  passed under direct vision. Throughout the                            procedure, the patient's blood pressure, pulse, and                            oxygen saturations were monitored continuously. The                            Endoscope was introduced through the mouth, and                            advanced to the second part of duodenum. The upper                            GI  endoscopy was accomplished without difficulty.                            The patient tolerated the procedure well. Scope In: Scope Out: Findings:                 A single 7 mm mucosal nodule was found at the                            gastroesophageal junction. Biopsies were taken with                            a cold forceps for histology. See images                           The esophagus was otherwise normal.                           The stomach was normal. Small hiatal hernia.                           The examined duodenum was normal.                           The cardia and gastric fundus were normal on                            retroflexion. Complications:            No immediate complications. Estimated Blood Loss:     Estimated blood loss: none. Impression:               - Mucosal nodule found in the esophagus. Biopsied.                           - Otherwise normal exam. Recommendation:           - Patient has a contact number available for                            emergencies. The signs and symptoms of potential  delayed complications were discussed with the                            patient. Return to normal activities tomorrow.                            Written discharge instructions were provided to the                            patient.                           - Resume previous diet.                           - Continue present medications.                           - Await pathology results. Follow-up to be                            determined after the biopsy results are available. Docia Chuck. Henrene Pastor, MD 04/28/2019 1:57:47 PM This report has been signed electronically.

## 2019-04-28 NOTE — Progress Notes (Signed)
Pt's states no medical or surgical changes since previsit or office visit.  Except he was at [facility] 8/10-8/14/2020 for sleep disorder.

## 2019-05-02 ENCOUNTER — Telehealth: Payer: Self-pay | Admitting: *Deleted

## 2019-05-02 NOTE — Telephone Encounter (Signed)
1. Have you developed a fever since your procedure? no  2.   Have you had an respiratory symptoms (SOB or cough) since your procedure? no  3.   Have you tested positive for COVID 19 since your procedure no  4.   Have you had any family members/close contacts diagnosed with the COVID 19 since your procedure?  no   If yes to any of these questions please route to Joylene John, RN and Alphonsa Gin, Therapist, sports.  Follow up Call-  Call back number 04/28/2019 10/08/2017  Post procedure Call Back phone  # [phone]  Permission to leave phone message Yes Yes  Some recent data might be hidden     Patient questions:  Do you have a fever, pain , or abdominal swelling? No. Pain Score  0 *  Have you tolerated food without any problems? Yes.    Have you been able to return to your normal activities? Yes.    Do you have any questions about your discharge instructions: Diet   No. Medications  No. Follow up visit  No.  Do you have questions or concerns about your Care? No.  Actions: * If pain score is 4 or above: No action needed, pain <4.

## 2019-05-04 ENCOUNTER — Other Ambulatory Visit: Payer: Self-pay

## 2019-05-04 ENCOUNTER — Encounter: Payer: Self-pay | Admitting: Internal Medicine

## 2019-05-04 DIAGNOSIS — K229 Disease of esophagus, unspecified: Secondary | ICD-10-CM

## 2019-05-06 ENCOUNTER — Encounter: Payer: Self-pay | Admitting: Internal Medicine

## 2019-05-08 ENCOUNTER — Other Ambulatory Visit (HOSPITAL_COMMUNITY): Payer: Self-pay | Admitting: Family

## 2019-05-12 ENCOUNTER — Other Ambulatory Visit (HOSPITAL_COMMUNITY): Payer: Self-pay | Admitting: Family

## 2019-05-17 ENCOUNTER — Telehealth: Payer: Self-pay

## 2019-05-17 NOTE — Telephone Encounter (Signed)
Covid-19 screening questions   Do you now or have you had a fever in the last 14 days? NO   Do you have any respiratory symptoms of shortness of breath or cough now or in the last 14 days? NO  Do you have any family members or close contacts with diagnosed or suspected Covid-19 in the past 14 days? NO  Have you been tested for Covid-19 and found to be positive? NO        

## 2019-05-18 ENCOUNTER — Encounter: Payer: Self-pay | Admitting: Internal Medicine

## 2019-05-18 ENCOUNTER — Other Ambulatory Visit: Payer: Self-pay | Admitting: Internal Medicine

## 2019-05-18 ENCOUNTER — Ambulatory Visit (AMBULATORY_SURGERY_CENTER): Payer: BC Managed Care – PPO | Admitting: Internal Medicine

## 2019-05-18 ENCOUNTER — Other Ambulatory Visit: Payer: Self-pay

## 2019-05-18 VITALS — BP 128/74 | HR 56 | Temp 97.8°F | Resp 13 | Ht 71.0 in | Wt 257.0 lb

## 2019-05-18 DIAGNOSIS — K219 Gastro-esophageal reflux disease without esophagitis: Secondary | ICD-10-CM | POA: Diagnosis not present

## 2019-05-18 DIAGNOSIS — K229 Disease of esophagus, unspecified: Secondary | ICD-10-CM | POA: Diagnosis not present

## 2019-05-18 DIAGNOSIS — K22710 Barrett's esophagus with low grade dysplasia: Secondary | ICD-10-CM

## 2019-05-18 DIAGNOSIS — D120 Benign neoplasm of cecum: Secondary | ICD-10-CM | POA: Diagnosis not present

## 2019-05-18 MED ORDER — SODIUM CHLORIDE 0.9 % IV SOLN: 500.0000 mL | Freq: Once | INTRAVENOUS | Status: DC

## 2019-05-18 NOTE — Progress Notes (Signed)
Called to room to assist during endoscopic procedure.  Patient ID and intended procedure confirmed with present staff. Received instructions for my participation in the procedure from the performing physician.  

## 2019-05-18 NOTE — Patient Instructions (Addendum)
YOU HAD AN ENDOSCOPIC PROCEDURE TODAY AT THE [facility]:   Refer to the procedure report that was given to you for any specific questions about what was found during the examination.  If the procedure report does not answer your questions, please call your gastroenterologist to clarify.  If you requested that your care partner not be given the details of your procedure findings, then the procedure report has been included in a sealed envelope for you to review at your convenience later.  YOU SHOULD EXPECT: Some feelings of bloating in the abdomen. Passage of more gas than usual.  Walking can help get rid of the air that was put into your GI tract during the procedure and reduce the bloating. If you had a lower endoscopy (such as a colonoscopy or flexible sigmoidoscopy) you may notice spotting of blood in your stool or on the toilet paper. If you underwent a bowel prep for your procedure, you may not have a normal bowel movement for a few days.  Please Note:  You might notice some irritation and congestion in your nose or some drainage.  This is from the oxygen used during your procedure.  There is no need for concern and it should clear up in a day or so.  SYMPTOMS TO REPORT IMMEDIATELY:   Following upper endoscopy (EGD)  Vomiting of blood or coffee ground material  New chest pain or pain under the shoulder blades  Painful or persistently difficult swallowing  New shortness of breath  Fever of 100F or higher  Black, tarry-looking stools  For urgent or emergent issues, a gastroenterologist can be reached at any hour by calling [phone].   DIET:  We do recommend a small meal at first, but then you may proceed to your regular diet.  Drink plenty of fluids but you should avoid alcoholic beverages for 24 hours.  ACTIVITY:  You should plan to take it easy for the rest of today and you should NOT DRIVE or use heavy machinery until tomorrow (because of the sedation medicines used  during the test).    FOLLOW UP: Our staff will call the number listed on your records 48-72 hours following your procedure to check on you and address any questions or concerns that you may have regarding the information given to you following your procedure. If we do not reach you, we will leave a message.  We will attempt to reach you two times.  During this call, we will ask if you have developed any symptoms of COVID 19. If you develop any symptoms (ie: fever, flu-like symptoms, shortness of breath, cough etc.) before then, please call [phone].  If you test positive for Covid 19 in the 2 weeks post procedure, please call and report this information to us.    If any biopsies were taken you will be contacted by phone or by letter within the next 1-3 weeks.  Please call us at [phone] if you have not heard about the biopsies in 3 weeks.    SIGNATURES/CONFIDENTIALITY: You and/or your care partner have signed paperwork which will be entered into your electronic medical record.  These signatures attest to the fact that that the information above on your After Visit Summary has been reviewed and is understood.  Full responsibility of the confidentiality of this discharge information lies with you and/or your care-partner. 

## 2019-05-18 NOTE — Progress Notes (Signed)
Pt's states no medical or surgical changes since previsit or office visit.  [facility] temps [place] vitals

## 2019-05-18 NOTE — Progress Notes (Signed)
To PACU, VSS. Report to [email or web address] 

## 2019-05-18 NOTE — Op Note (Signed)
[facility] Patient Name: Gregg Smith Procedure Date: 05/18/2019 10:19 AM MRN: [id number] Endoscopist: Docia Chuck. Henrene Pastor , MD Age: 73 Referring MD:  Date of Birth: [birth year] Gender: Male Account #: [id number] Procedure:                Upper GI endoscopy with biopsy; with snare                            polypectomy/ablation Indications:              Therapeutic procedure,. 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 Biopsied previously. Now for                            excision and thermal ablation Medicines:                Monitored Anesthesia Care Procedure:                Pre-Anesthesia Assessment:                           - Prior to the procedure, a History and Physical                            was performed, and patient medications and                            allergies were reviewed. The patient's tolerance of                            previous anesthesia was also reviewed. The risks                            and benefits of the procedure and the sedation                            options and risks were discussed with the patient.                            All questions were answered, and informed consent                            was obtained. Prior Anticoagulants: The patient has                            taken no previous anticoagulant or antiplatelet                            agents. ASA Grade Assessment: II - A patient with  mild systemic disease. After reviewing the risks                            and benefits, the patient was deemed in                            satisfactory condition to undergo the procedure.                           After obtaining informed consent, the endoscope was                            passed under direct vision. Throughout the                            procedure, the patient's blood  pressure, pulse, and                            oxygen saturations were monitored continuously. The                            Endoscope was introduced through the mouth, and                            advanced to the second part of duodenum. The upper                            GI endoscopy was accomplished without difficulty.                            The patient tolerated the procedure well. Scope In: Scope Out: Findings:                 The proximal esophagus was normal.                           The area of previous small nodule in the lower                            third of the esophagus, at the gastroesophageal                            junction, was identified. See images. Majority of                            the lesion was removed with cold biopsy forceps                            previously. That same area and surrounding area was                            aggressively biopsied with cold forceps.  Subsequently, thermal ablation was used via hot                            snare technique to treat tissue in the biopsy bed                            and surrounding area. All tissue was retrieved and                            submitted for pathologic analysis.                           The stomach was normal save a sliding hiatal hernia.                           The examined duodenum was normal.                           The cardia and gastric fundus were normal on                            retroflexion. Complications:            No immediate complications. Estimated Blood Loss:     Estimated blood loss: none. Impression:               1. Residual area of small dysplastic nodule                            biopsied and treated with snare thermal ablation as                            described                           2. Otherwise unremarkable EGD. Recommendation:           - Patient has a contact number available for                             emergencies. The signs and symptoms of potential                            delayed complications were discussed with the                            patient. Return to normal activities tomorrow.                            Written discharge instructions were provided to the                            patient.                           - Resume previous diet.                           -  Continue present medications.                           - Await pathology results.                           - Follow-up EGD timing to be determined based on                            biopsy and tissue results. Suspect 3 to 6 months Marthe Dant N. Henrene Pastor, MD 05/18/2019 10:57:34 AM This report has been signed electronically.

## 2019-05-20 ENCOUNTER — Telehealth: Payer: Self-pay | Admitting: *Deleted

## 2019-05-20 NOTE — Telephone Encounter (Signed)
  Follow up Call-  Call back number 05/18/2019 04/28/2019 10/08/2017  Post procedure Call Back phone  # [phone]  Permission to leave phone message Yes Yes Yes  Some recent data might be hidden    [facility]

## 2019-05-20 NOTE — Telephone Encounter (Signed)
  Follow up Call-  Call back number 05/18/2019 04/28/2019 10/08/2017  Post procedure Call Back phone  # [phone]  Permission to leave phone message Yes Yes Yes  Some recent data might be hidden     Patient questions:  Do you have a fever, pain , or abdominal swelling? No. Pain Score  0 *  Have you tolerated food without any problems? Yes.    Have you been able to return to your normal activities? Yes.    Do you have any questions about your discharge instructions: Diet   No. Medications  No. Follow up visit  No.  Do you have questions or concerns about your Care? No.  Actions: * If pain score is 4 or above: No action needed, pain <4.  1. Have you developed a fever since your procedure? no  2.   Have you had an respiratory symptoms (SOB or cough) since your procedure? no  3.   Have you tested positive for COVID 19 since your procedure no  4.   Have you had any family members/close contacts diagnosed with the COVID 19 since your procedure?  no   If yes to any of these questions please route to Joylene John, RN and Alphonsa Gin, Therapist, sports.

## 2019-05-24 ENCOUNTER — Encounter: Payer: Self-pay | Admitting: Internal Medicine

## 2019-05-26 ENCOUNTER — Other Ambulatory Visit: Payer: Self-pay

## 2019-05-26 DIAGNOSIS — K219 Gastro-esophageal reflux disease without esophagitis: Secondary | ICD-10-CM

## 2019-05-26 DIAGNOSIS — K2270 Barrett's esophagus without dysplasia: Secondary | ICD-10-CM

## 2019-05-26 MED ORDER — LANSOPRAZOLE 30 MG PO CPDR: 30.0000 mg | DELAYED_RELEASE_CAPSULE | Freq: Two times a day (BID) | ORAL | 3 refills | Status: DC

## 2019-08-18 ENCOUNTER — Other Ambulatory Visit: Payer: Self-pay | Admitting: Emergency Medicine

## 2019-08-18 DIAGNOSIS — I10 Essential (primary) hypertension: Secondary | ICD-10-CM

## 2019-08-18 NOTE — Telephone Encounter (Signed)
No further refills without office visit 

## 2019-08-18 NOTE — Telephone Encounter (Signed)
Forwarding medication refill request to the clinical pool for review. 

## 2019-09-03 ENCOUNTER — Other Ambulatory Visit: Payer: Self-pay | Admitting: Emergency Medicine

## 2019-09-03 DIAGNOSIS — E785 Hyperlipidemia, unspecified: Secondary | ICD-10-CM

## 2019-09-06 ENCOUNTER — Other Ambulatory Visit: Payer: Self-pay

## 2019-09-06 ENCOUNTER — Encounter: Payer: Self-pay | Admitting: Emergency Medicine

## 2019-09-06 ENCOUNTER — Ambulatory Visit (INDEPENDENT_AMBULATORY_CARE_PROVIDER_SITE_OTHER): Payer: BC Managed Care – PPO | Admitting: Emergency Medicine

## 2019-09-06 VITALS — BP 132/84 | HR 69 | Temp 98.2°F | Resp 16 | Ht 71.0 in | Wt 263.0 lb

## 2019-09-06 DIAGNOSIS — Z1329 Encounter for screening for other suspected endocrine disorder: Secondary | ICD-10-CM | POA: Diagnosis not present

## 2019-09-06 DIAGNOSIS — Z130 Encounter for screening for diseases of the blood and blood-forming organs and certain disorders involving the immune mechanism: Secondary | ICD-10-CM | POA: Diagnosis not present

## 2019-09-06 DIAGNOSIS — E785 Hyperlipidemia, unspecified: Secondary | ICD-10-CM

## 2019-09-06 DIAGNOSIS — Z8719 Personal history of other diseases of the digestive system: Secondary | ICD-10-CM

## 2019-09-06 DIAGNOSIS — Z0000 Encounter for general adult medical examination without abnormal findings: Secondary | ICD-10-CM

## 2019-09-06 DIAGNOSIS — Z8679 Personal history of other diseases of the circulatory system: Secondary | ICD-10-CM

## 2019-09-06 DIAGNOSIS — K219 Gastro-esophageal reflux disease without esophagitis: Secondary | ICD-10-CM

## 2019-09-06 DIAGNOSIS — Z13228 Encounter for screening for other metabolic disorders: Secondary | ICD-10-CM

## 2019-09-06 DIAGNOSIS — Z8659 Personal history of other mental and behavioral disorders: Secondary | ICD-10-CM

## 2019-09-06 DIAGNOSIS — Z0001 Encounter for general adult medical examination with abnormal findings: Secondary | ICD-10-CM

## 2019-09-06 DIAGNOSIS — Z13220 Encounter for screening for lipoid disorders: Secondary | ICD-10-CM

## 2019-09-06 DIAGNOSIS — Z87898 Personal history of other specified conditions: Secondary | ICD-10-CM

## 2019-09-06 NOTE — Progress Notes (Signed)
Gregg Smith 74 y.o.   Chief Complaint  Patient presents with  . Annual Exam    HISTORY OF PRESENT ILLNESS: This is a 74 y.o. male here for his annual exam. Has a history of Barrett's esophagus, status post upper endoscopy last September.  Report reviewed.  Taking lansoprazole 30 mg twice a day and sucralfate 1 g 3 times a day before meals. Has a history of hypertension, on lisinopril-hydrochlorothiazide.  Doing well. Has a history of dyslipidemia, on Crestor 10 mg daily. Wife passed away on August 22, 2018 and patient went through severe episodes of depression and insomnia.  Inpatient treatment in 2020.  Doing better.  Sleeping better.  Takes Seroquel, trazodone, and melatonin with good results.  Has been able to decrease doses of each medication. Has no complaints or medical concerns today. Health maintenance reviewed.  Up-to-date with vaccines and colonoscopies.  HPI   Prior to Admission medications   Medication Sig Start Date End Date Taking? Authorizing Provider  lansoprazole (PREVACID) 30 MG capsule Take 1 capsule (30 mg total) by mouth 2 (two) times daily before a meal. 05/26/19  Yes Irene Shipper, MD  lisinopril-hydrochlorothiazide (ZESTORETIC) 10-12.5 MG tablet Take 1 tablet by mouth daily. 08/18/19  Yes Aggie Douse, Ines Bloomer, MD  Melatonin 5 MG TABS Take 5 mg by mouth at bedtime. 04/12/19  Yes [provider]  Multiple Vitamin (MULTIVITAMIN) tablet Take 1 tablet by mouth daily.   Yes [provider]  QUEtiapine (SEROQUEL) 100 MG tablet Take 1 tablet (100 mg total) by mouth at bedtime. 04/16/19  Yes Derrill Center, NP  rosuvastatin (CRESTOR) 10 MG tablet TAKE 1 TABLET BY MOUTH EVERY DAY 09/04/19  Yes Hermenegildo Clausen, Ines Bloomer, MD  sucralfate (CARAFATE) 1 g tablet AVOID MORNING DOSE, AND TAKE AT LUNCH, DINNER AND BEDTIME Patient taking differently: Take 1 g by mouth See admin instructions. Avoid morning dose, and take at lunch, dinner and bedtime. 08/20/18  Yes  Mahrosh Donnell, Ines Bloomer, MD  tamsulosin (FLOMAX) 0.4 MG CAPS capsule TAKE 1 CAPSULE BY MOUTH DAILY AT NIGHT. 09/04/19  Yes Eward Rutigliano, Ines Bloomer, MD  traZODone (DESYREL) 50 MG tablet Take 1 tablet (50 mg total) by mouth at bedtime and may repeat dose one time if needed. 04/16/19  Yes Derrill Center, NP  busPIRone (BUSPAR) 10 MG tablet Take 1 tablet (10 mg total) by mouth 3 (three) times daily. Patient not taking: Reported on 09/06/2019 04/16/19   Derrill Center, NP  gabapentin (NEURONTIN) 300 MG capsule Take 1 capsule (300 mg total) by mouth 3 (three) times daily. Patient not taking: Reported on 09/06/2019 04/16/19   Derrill Center, NP  meloxicam (MOBIC) 15 MG tablet Take 15 mg by mouth daily.    [provider]    Allergies  Allergen Reactions  . Penicillins Hives and Rash    Has patient had a PCN reaction causing immediate rash, facial/tongue/throat swelling, SOB or lightheadedness with hypotension:unsure Has patient had a PCN reaction causing severe rash involving mucus membranes or skin necrosis:unsure Has patient had a PCN reaction that required hospitalization:No Has patient had a PCN reaction occurring within the last 10 years:No If all of the above answers are "NO", then may proceed with Cephalosporin use. Childhood reaction     Patient Active Problem List   Diagnosis Date Noted  . Generalized anxiety disorder   . Panic disorder   . Insomnia   . 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 GERD (gastroesophageal reflux disease) 08/14/2017  .  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 OSA (obstructive sleep apnea) 03/22/2014  . Snoring disorder 05/09/2013  . Unspecified essential hypertension 02/11/2013  . Other and unspecified hyperlipidemia 02/11/2013  . Arthritis 02/11/2013    Past Medical History:  Diagnosis Date  . Allergy    seasonal   . Anxiety   . Arthritis   . Barrett's esophagus   . Cataract   . Chicken pox   . Colon  polyps    hyperplastic  . Diverticulosis   . GERD (gastroesophageal reflux disease)   . Hemorrhoids   . Hyperlipidemia   . Hypertension   . Leg cramps   . Measles   . Mumps   . Pneumonia   . Rhinitis   . Sleep apnea    dental device  . Snoring disorder 05/09/2013    Past Surgical History:  Procedure Laterality Date  . APPENDECTOMY  1957  . CATARACT EXTRACTION    . COLONOSCOPY    . JOINT REPLACEMENT    . TONSILLECTOMY    . [place]  . 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 UPPER GASTROINTESTINAL ENDOSCOPY      Social History   Socioeconomic History  . Marital status: Widowed    Spouse name: [name]  . Number of children: 1  . Years of education: PHD  . Highest education level: Not on file  Occupational History    Comment: Professor,[employer]    Employer: [employer]  Tobacco Use  . Smoking status: Former Smoker    Packs/day: 1.00    Years: 40.00    Pack years: 40.00    Types: Cigarettes    Quit date: 01/19/2007    Years since quitting: 12.6  . Smokeless tobacco: Never Used  Substance and Sexual Activity  . Alcohol use: Yes    Alcohol/week: 2.0 standard drinks    Types: 2 Cans of beer per week  . Drug use: No  . Sexual activity: Yes    Birth control/protection: None  Other Topics Concern  . 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   Patient is working full-time.   Patient has a Ph.D   Patient has one adult child.   Patient is right-handed.   Patient drinks two cups of tea daily.   Social Determinants of Health   Financial Resource Strain:   . Difficulty of Paying Living Expenses: Not on file  Food Insecurity:   . Worried About Charity fundraiser in the Last Year: Not on file  . Ran Out of Food in the Last Year:  Not on file  Transportation Needs:   . Lack of Transportation (Medical): Not on file  . Lack of Transportation (Non-Medical): Not on file  Physical Activity:   . Days of Exercise per Week: Not on file  . Minutes of Exercise per Session: Not on file  Stress:   . Feeling of Stress : Not on file  Social Connections:   . Frequency of Communication with Friends and Family: Not on file  . Frequency of Social Gatherings with Friends and Family: Not on file  . Attends Religious Services: Not on file  . Active Member of Clubs or Organizations: Not on file  . Attends  Club or Organization Meetings: Not on file  . Marital Status: Not on file  Intimate Partner Violence:   . Fear of Current or Ex-Partner: Not on file  . Emotionally Abused: Not on file  . Physically Abused: Not on file  . Sexually Abused: Not on file    Family History  Problem Relation Age of Onset  . Alzheimer's disease Mother   . Cancer Father   . Stomach cancer Father   . Aortic aneurysm Son   . Heart disease Son   . Cancer Maternal Grandmother   . Heart disease Maternal Grandfather   . Stomach cancer Paternal Grandfather   . Colon cancer Neg Hx   . Colon polyps Neg Hx   . Esophageal cancer Neg Hx   . Rectal cancer Neg Hx   . Pancreatic cancer Neg Hx   . Liver cancer Neg Hx      Review of Systems  Constitutional: Negative.  Negative for chills and fever.  HENT: Negative.  Negative for congestion and sore throat.   Respiratory: Negative.  Negative for cough and shortness of breath.   Cardiovascular: Negative.  Negative for chest pain and palpitations.  Gastrointestinal: Negative.  Negative for abdominal pain, blood in stool, diarrhea, nausea and vomiting.  Genitourinary: Negative.  Negative for dysuria and hematuria.  Musculoskeletal: Negative.  Negative for myalgias.  Skin: Negative.  Negative for rash.  Neurological: Negative.  Negative for dizziness and headaches.  All other systems reviewed and are  negative.  Today's Vitals   09/06/19 1028  BP: 132/84  Pulse: 69  Resp: 16  Temp: 98.2 F (36.8 C)  TempSrc: Temporal  SpO2: 95%  Weight: 263 lb (119.3 kg)  Height: 5\' 11"  (1.803 m)   Body mass index is 36.68 kg/m.   Physical Exam Vitals reviewed.  Constitutional:      Appearance: Normal appearance.  HENT:     Head: Normocephalic.  Eyes:     Extraocular Movements: Extraocular movements intact.     Conjunctiva/sclera: Conjunctivae normal.     Pupils: Pupils are equal, round, and reactive to light.  Neck:     Vascular: No carotid bruit.  Cardiovascular:     Rate and Rhythm: Normal rate and regular rhythm.     Pulses: Normal pulses.     Heart sounds: Normal heart sounds.  Pulmonary:     Effort: Pulmonary effort is normal.     Breath sounds: Normal breath sounds.  Abdominal:     General: There is no distension.     Palpations: Abdomen is soft.     Tenderness: There is no abdominal tenderness.  Musculoskeletal:        General: Normal range of motion.     Cervical back: Normal range of motion and neck supple.  Lymphadenopathy:     Cervical: No cervical adenopathy.  Skin:    General: Skin is warm and dry.     Capillary Refill: Capillary refill takes less than 2 seconds.  Neurological:     General: No focal deficit present.     Mental Status: He is alert and oriented to person, place, and time.  Psychiatric:        Mood and Affect: Mood normal.        Behavior: Behavior normal.      ASSESSMENT & PLAN: Clinically stable.  No medical concerns identified during this visit. Dayvion was seen today for annual exam.  Diagnoses and all orders for this visit:  Routine general medical examination at  a health care facility  Screening for deficiency anemia -     CBC with Differential  Screening for lipoid disorders -     Lipid panel  Screening for endocrine, metabolic and immunity disorder -     Comprehensive metabolic panel  Gastroesophageal reflux disease,  unspecified whether esophagitis present  History of hypertension  History of Barrett's esophagus  Dyslipidemia  History of depression  History of insomnia    Patient Instructions       If you have lab work done today you will be contacted with your lab results within the next 2 weeks.  If you have not heard from [place] then please contact us. The fastest way to get your results is to register for My Chart.   IF you received an x-ray today, you will receive an invoice from [facility]. Please contact [facility] at [phone] with questions or concerns regarding your invoice.   IF you received labwork today, you will receive an invoice from [place]. Please contact [facility] at [phone] with questions or concerns regarding your invoice.   Our billing staff will not be able to assist you with questions regarding bills from these companies.  You will be contacted with the lab results as soon as they are available. The fastest way to get your results is to activate your My Chart account. Instructions are located on the last page of this paperwork. If you have not heard from [place] regarding the results in 2 weeks, please contact this office.      Health Maintenance, Male Adopting a healthy lifestyle and getting preventive care are important in promoting health and wellness. Ask your health care provider about:  The right schedule for you to have regular tests and exams.  Things you can do on your own to prevent diseases and keep yourself healthy. What should I know about diet, weight, and exercise? Eat a healthy diet   Eat a diet that includes plenty of vegetables, fruits, low-fat dairy products, and lean protein.  Do not eat a lot of foods that are high in solid fats, added sugars, or sodium. Maintain a healthy weight Body mass index (BMI) is a measurement that can be used to identify possible weight problems. It estimates body fat based on height and  weight. Your health care provider can help determine your BMI and help you achieve or maintain a healthy weight. Get regular exercise Get regular exercise. This is one of the most important things you can do for your health. Most adults should:  Exercise for at least 150 minutes each week. The exercise should increase your heart rate and make you sweat (moderate-intensity exercise).  Do strengthening exercises at least twice a week. This is in addition to the moderate-intensity exercise.  Spend less time sitting. Even light physical activity can be beneficial. Watch cholesterol and blood lipids Have your blood tested for lipids and cholesterol at 74 years of age, then have this test every 5 years. You may need to have your cholesterol levels checked more often if:  Your lipid or cholesterol levels are high.  You are older than 74 years of age.  You are at high risk for heart disease. What should I know about cancer screening? Many types of cancers can be detected early and may often be prevented. Depending on your health history and family history, you may need to have cancer screening at various ages. This may include screening for:  Colorectal cancer.  Prostate cancer.  Skin cancer.  Lung cancer. What should I know about heart disease, diabetes, and high blood pressure? Blood pressure and heart disease  High blood pressure causes heart disease and increases the risk of stroke. This is more likely to develop in people who have high blood pressure readings, are of African descent, or are overweight.  Talk with your health care provider about your target blood pressure readings.  Have your blood pressure checked: ? Every 3-5 years if you are 54-72 years of age. ? Every year if you are 79 years old or older.  If you are between the ages of 65 and 65 and are a current or former smoker, ask your health care provider if you should have a one-time screening for abdominal aortic  aneurysm (AAA). Diabetes Have regular diabetes screenings. This checks your fasting blood sugar level. Have the screening done:  Once every three years after age 28 if you are at a normal weight and have a low risk for diabetes.  More often and at a younger age if you are overweight or have a high risk for diabetes. What should I know about preventing infection? Hepatitis B If you have a higher risk for hepatitis B, you should be screened for this virus. Talk with your health care provider to find out if you are at risk for hepatitis B infection. Hepatitis C Blood testing is recommended for:  Everyone born from [birth year] through 1965.  Anyone with known risk factors for hepatitis C. Sexually transmitted infections (STIs)  You should be screened each year for STIs, including gonorrhea and chlamydia, if: ? You are sexually active and are younger than 74 years of age. ? You are older than 74 years of age and your health care provider tells you that you are at risk for this type of infection. ? Your sexual activity has changed since you were last screened, and you are at increased risk for chlamydia or gonorrhea. Ask your health care provider if you are at risk.  Ask your health care provider about whether you are at high risk for HIV. Your health care provider may recommend a prescription medicine to help prevent HIV infection. If you choose to take medicine to prevent HIV, you should first get tested for HIV. You should then be tested every 3 months for as long as you are taking the medicine. Follow these instructions at home: Lifestyle  Do not use any products that contain nicotine or tobacco, such as cigarettes, e-cigarettes, and chewing tobacco. If you need help quitting, ask your health care provider.  Do not use street drugs.  Do not share needles.  Ask your health care provider for help if you need support or information about quitting drugs. Alcohol use  Do not drink alcohol if  your health care provider tells you not to drink.  If you drink alcohol: ? Limit how much you have to 0-2 drinks a day. ? Be aware of how much alcohol is in your drink. In the [place] one drink equals one 12 oz bottle of beer (355 mL), one 5 oz glass of wine (148 mL), or one 1 oz glass of hard liquor (44 mL). General instructions  Schedule regular health, dental, and eye exams.  Stay current with your vaccines.  Tell your health care provider if: ? You often feel depressed. ? You have ever been abused or do not feel safe at home. Summary  Adopting a healthy lifestyle and getting preventive care are important in promoting health and wellness.  Follow your health care provider's instructions about healthy diet, exercising, and getting tested or screened for diseases.  Follow your health care provider's instructions on monitoring your cholesterol and blood pressure. This information is not intended to replace advice given to you by your health care provider. Make sure you discuss any questions you have with your health care provider. Document Revised: 08/11/2018 Document Reviewed: 08/11/2018 Elsevier Patient Education  2020 Elsevier Inc.      Agustina Caroli, MD [facility]

## 2019-09-06 NOTE — Patient Instructions (Addendum)
   If you have lab work done today you will be contacted with your lab results within the next 2 weeks.  If you have not heard from us then please contact us. The fastest way to get your results is to register for My Chart.   IF you received an x-ray today, you will receive an invoice from [facility]. Please contact [facility] at [phone] with questions or concerns regarding your invoice.   IF you received labwork today, you will receive an invoice from [facility]. Please contact [facility] at [phone] with questions or concerns regarding your invoice.   Our billing staff will not be able to assist you with questions regarding bills from these companies.  You will be contacted with the lab results as soon as they are available. The fastest way to get your results is to activate your My Chart account. Instructions are located on the last page of this paperwork. If you have not heard from us regarding the results in 2 weeks, please contact this office.      Health Maintenance, Male Adopting a healthy lifestyle and getting preventive care are important in promoting health and wellness. Ask your health care provider about:  The right schedule for you to have regular tests and exams.  Things you can do on your own to prevent diseases and keep yourself healthy. What should I know about diet, weight, and exercise? Eat a healthy diet   Eat a diet that includes plenty of vegetables, fruits, low-fat dairy products, and lean protein.  Do not eat a lot of foods that are high in solid fats, added sugars, or sodium. Maintain a healthy weight Body mass index (BMI) is a measurement that can be used to identify possible weight problems. It estimates body fat based on height and weight. Your health care provider can help determine your BMI and help you achieve or maintain a healthy weight. Get regular exercise Get regular exercise. This is one of the most important things you  can do for your health. Most adults should:  Exercise for at least 150 minutes each week. The exercise should increase your heart rate and make you sweat (moderate-intensity exercise).  Do strengthening exercises at least twice a week. This is in addition to the moderate-intensity exercise.  Spend less time sitting. Even light physical activity can be beneficial. Watch cholesterol and blood lipids Have your blood tested for lipids and cholesterol at 74 years of age, then have this test every 5 years. You may need to have your cholesterol levels checked more often if:  Your lipid or cholesterol levels are high.  You are older than 74 years of age.  You are at high risk for heart disease. What should I know about cancer screening? Many types of cancers can be detected early and may often be prevented. Depending on your health history and family history, you may need to have cancer screening at various ages. This may include screening for:  Colorectal cancer.  Prostate cancer.  Skin cancer.  Lung cancer. What should I know about heart disease, diabetes, and high blood pressure? Blood pressure and heart disease  High blood pressure causes heart disease and increases the risk of stroke. This is more likely to develop in people who have high blood pressure readings, are of African descent, or are overweight.  Talk with your health care provider about your target blood pressure readings.  Have your blood pressure checked: ? Every 3-5 years if you are 18-39   years of age. ? Every year if you are 40 years old or older.  If you are between the ages of 65 and 75 and are a current or former smoker, ask your health care provider if you should have a one-time screening for abdominal aortic aneurysm (AAA). Diabetes Have regular diabetes screenings. This checks your fasting blood sugar level. Have the screening done:  Once every three years after age 45 if you are at a normal weight and have  a low risk for diabetes.  More often and at a younger age if you are overweight or have a high risk for diabetes. What should I know about preventing infection? Hepatitis B If you have a higher risk for hepatitis B, you should be screened for this virus. Talk with your health care provider to find out if you are at risk for hepatitis B infection. Hepatitis C Blood testing is recommended for:  Everyone born from 1945 through 1965.  Anyone with known risk factors for hepatitis C. Sexually transmitted infections (STIs)  You should be screened each year for STIs, including gonorrhea and chlamydia, if: ? You are sexually active and are younger than 74 years of age. ? You are older than 74 years of age and your health care provider tells you that you are at risk for this type of infection. ? Your sexual activity has changed since you were last screened, and you are at increased risk for chlamydia or gonorrhea. Ask your health care provider if you are at risk.  Ask your health care provider about whether you are at high risk for HIV. Your health care provider may recommend a prescription medicine to help prevent HIV infection. If you choose to take medicine to prevent HIV, you should first get tested for HIV. You should then be tested every 3 months for as long as you are taking the medicine. Follow these instructions at home: Lifestyle  Do not use any products that contain nicotine or tobacco, such as cigarettes, e-cigarettes, and chewing tobacco. If you need help quitting, ask your health care provider.  Do not use street drugs.  Do not share needles.  Ask your health care provider for help if you need support or information about quitting drugs. Alcohol use  Do not drink alcohol if your health care provider tells you not to drink.  If you drink alcohol: ? Limit how much you have to 0-2 drinks a day. ? Be aware of how much alcohol is in your drink. In the [place] one drink equals one 12  oz bottle of beer (355 mL), one 5 oz glass of wine (148 mL), or one 1 oz glass of hard liquor (44 mL). General instructions  Schedule regular health, dental, and eye exams.  Stay current with your vaccines.  Tell your health care provider if: ? You often feel depressed. ? You have ever been abused or do not feel safe at home. Summary  Adopting a healthy lifestyle and getting preventive care are important in promoting health and wellness.  Follow your health care provider's instructions about healthy diet, exercising, and getting tested or screened for diseases.  Follow your health care provider's instructions on monitoring your cholesterol and blood pressure. This information is not intended to replace advice given to you by your health care provider. Make sure you discuss any questions you have with your health care provider. Document Revised: 08/11/2018 Document Reviewed: 08/11/2018 Elsevier Patient Education  2020 Elsevier Inc.  

## 2019-09-07 ENCOUNTER — Encounter: Payer: Self-pay | Admitting: Emergency Medicine

## 2019-09-07 LAB — COMPREHENSIVE METABOLIC PANEL
ALT: 28 IU/L (ref 0–44)
AST: 37 IU/L (ref 0–40)
Albumin/Globulin Ratio: 1.5 (ref 1.2–2.2)
Albumin: 4.2 g/dL (ref 3.7–4.7)
Alkaline Phosphatase: 85 IU/L (ref 39–117)
BUN/Creatinine Ratio: 18 (ref 10–24)
BUN: 19 mg/dL (ref 8–27)
Bilirubin Total: 0.3 mg/dL (ref 0.0–1.2)
CO2: 25 mmol/L (ref 20–29)
Calcium: 9.1 mg/dL (ref 8.6–10.2)
Chloride: 106 mmol/L (ref 96–106)
Creatinine, Ser: 1.07 mg/dL (ref 0.76–1.27)
GFR calc Af Amer: 79 mL/min/{1.73_m2} (ref >59)
GFR calc non Af Amer: 68 mL/min/{1.73_m2} (ref >59)
Globulin, Total: 2.8 g/dL (ref 1.5–4.5)
Glucose: 97 mg/dL (ref 65–99)
Potassium: 4.2 mmol/L (ref 3.5–5.2)
Sodium: 142 mmol/L (ref 134–144)
Total Protein: 7 g/dL (ref 6.0–8.5)

## 2019-09-07 LAB — CBC WITH DIFFERENTIAL/PLATELET
Basophils Absolute: 0.1 10*3/uL (ref 0.0–0.2)
Basos: 1 %
EOS (ABSOLUTE): 0.2 10*3/uL (ref 0.0–0.4)
Eos: 3 %
Hematocrit: 44.9 % (ref 37.5–51.0)
Hemoglobin: 15.6 g/dL (ref 13.0–17.7)
Immature Grans (Abs): 0 10*3/uL (ref 0.0–0.1)
Immature Granulocytes: 0 %
Lymphocytes Absolute: 1.1 10*3/uL (ref 0.7–3.1)
Lymphs: 17 %
MCH: 29.1 pg (ref 26.6–33.0)
MCHC: 34.7 g/dL (ref 31.5–35.7)
MCV: 84 fL (ref 79–97)
Monocytes Absolute: 0.8 10*3/uL (ref 0.1–0.9)
Monocytes: 12 %
Neutrophils Absolute: 4.3 10*3/uL (ref 1.4–7.0)
Neutrophils: 67 %
Platelets: 197 10*3/uL (ref 150–450)
RBC: 5.37 x10E6/uL (ref 4.14–5.80)
RDW: 14.5 % (ref 11.6–15.4)
WBC: 6.3 10*3/uL (ref 3.4–10.8)

## 2019-09-07 LAB — LIPID PANEL
Chol/HDL Ratio: 4.3 ratio (ref 0.0–5.0)
Cholesterol, Total: 163 mg/dL (ref 100–199)
HDL: 38 mg/dL — ABNORMAL LOW (ref >39)
LDL Chol Calc (NIH): 101 mg/dL — ABNORMAL HIGH (ref 0–99)
Triglycerides: 134 mg/dL (ref 0–149)
VLDL Cholesterol Cal: 24 mg/dL (ref 5–40)

## 2019-09-10 ENCOUNTER — Other Ambulatory Visit: Payer: Self-pay | Admitting: Emergency Medicine

## 2019-09-10 DIAGNOSIS — I10 Essential (primary) hypertension: Secondary | ICD-10-CM

## 2019-10-04 ENCOUNTER — Other Ambulatory Visit: Payer: Self-pay | Admitting: Emergency Medicine

## 2019-10-04 DIAGNOSIS — I10 Essential (primary) hypertension: Secondary | ICD-10-CM

## 2019-10-04 NOTE — Telephone Encounter (Signed)
Requested Prescriptions  Pending Prescriptions Disp Refills  . lisinopril-hydrochlorothiazide (ZESTORETIC) 10-12.5 MG tablet [Pharmacy Med Name: LISINOPRIL-HCTZ 10-12.5 MG TAB] 90 tablet 1    Sig: TAKE 1 TABLET BY MOUTH EVERY DAY     Cardiovascular:  ACEI + Diuretic Combos Passed - 10/04/2019  2:32 PM      Passed - Na in normal range and within 180 days    Sodium  Date Value Ref Range Status  09/06/2019 142 134 - 144 mmol/L Final         Passed - K in normal range and within 180 days    Potassium  Date Value Ref Range Status  09/06/2019 4.2 3.5 - 5.2 mmol/L Final         Passed - Cr in normal range and within 180 days    Creat  Date Value Ref Range Status  09/05/2015 1.04 0.70 - 1.25 mg/dL Final   Creatinine, Ser  Date Value Ref Range Status  09/06/2019 1.07 0.76 - 1.27 mg/dL Final         Passed - Ca in normal range and within 180 days    Calcium  Date Value Ref Range Status  09/06/2019 9.1 8.6 - 10.2 mg/dL Final         Passed - Patient is not pregnant      Passed - Last BP in normal range    BP Readings from Last 1 Encounters:  09/06/19 132/84         Passed - Valid encounter within last 6 months    Recent Outpatient Visits          4 weeks ago Routine general medical examination at a health care facility   Primary Care at [place], Ines Bloomer, MD   6 months ago Insomnia, unspecified type   Primary Care at [facility], [place], MD   6 months ago Insomnia, unspecified type   Primary Care at [facility], [place], MD   6 months ago Insomnia, unspecified type   Primary Care at [facility], Ines Bloomer, MD   6 months ago Situational anxiety   Primary Care at [place], Ines Bloomer, MD      Future Appointments            In 5 months [place], Ines Bloomer, MD Primary Care at [place], [facility]

## 2019-10-29 ENCOUNTER — Other Ambulatory Visit: Payer: Self-pay | Admitting: Emergency Medicine

## 2019-10-29 DIAGNOSIS — K219 Gastro-esophageal reflux disease without esophagitis: Secondary | ICD-10-CM

## 2019-11-26 ENCOUNTER — Other Ambulatory Visit: Payer: Self-pay | Admitting: Emergency Medicine

## 2019-11-26 DIAGNOSIS — E785 Hyperlipidemia, unspecified: Secondary | ICD-10-CM

## 2019-11-26 NOTE — Telephone Encounter (Signed)
Requested Prescriptions  Pending Prescriptions Disp Refills  . rosuvastatin (CRESTOR) 10 MG tablet [Pharmacy Med Name: ROSUVASTATIN CALCIUM 10 MG TAB] 90 tablet 0    Sig: TAKE 1 TABLET BY MOUTH EVERY DAY     Cardiovascular:  Antilipid - Statins Failed - 11/26/2019  9:44 AM      Failed - HDL in normal range and within 360 days    HDL  Date Value Ref Range Status  09/06/2019 38 (L) >39 mg/dL Final         Passed - Total Cholesterol in normal range and within 360 days    Cholesterol, Total  Date Value Ref Range Status  09/06/2019 163 100 - 199 mg/dL Final         Passed - LDL in normal range and within 360 days    LDL Chol Calc (NIH)  Date Value Ref Range Status  09/06/2019 101 (H) 0 - 99 mg/dL Final   Direct LDL  Date Value Ref Range Status  02/17/2014 91 mg/dL Final    Comment:    ATP III Classification (LDL):       < 100        mg/dL         Optimal      100 - 129     mg/dL         Near or Above Optimal      130 - 159     mg/dL         Borderline High      160 - 189     mg/dL         High       > 190        mg/dL         Very High           Passed - Triglycerides in normal range and within 360 days    Triglycerides  Date Value Ref Range Status  09/06/2019 134 0 - 149 mg/dL Final         Passed - Patient is not pregnant      Passed - Valid encounter within last 12 months    Recent Outpatient Visits          2 months ago Routine general medical examination at a health care facility   Primary Care at [place], [place], MD   7 months ago Insomnia, unspecified type   Primary Care at [facility], [place], MD   7 months ago Insomnia, unspecified type   Primary Care at [facility], [place], MD   8 months ago Insomnia, unspecified type   Primary Care at [facility], Ines Bloomer, MD   8 months ago Situational anxiety   Primary Care at [place], Ines Bloomer, MD      Future Appointments            In 3 months [place], Ines Bloomer, MD Primary Care at [place], [facility]           . tamsulosin (FLOMAX) 0.4 MG CAPS capsule [Pharmacy Med Name: TAMSULOSIN HCL 0.4 MG CAPSULE] 90 capsule 0    Sig: TAKE 1 CAPSULE BY MOUTH DAILY AT NIGHT.     Urology: Alpha-Adrenergic Blocker Passed - 11/26/2019  9:44 AM      Passed - Last BP in normal range    BP Readings from Last 1 Encounters:  09/06/19 132/84         Passed - Valid  encounter within last 12 months    Recent Outpatient Visits          2 months ago Routine general medical examination at a health care facility   Primary Care at [facility], [place], MD   7 months ago Insomnia, unspecified type   Primary Care at [facility], [place], MD   7 months ago Insomnia, unspecified type   Primary Care at [facility] [place], MD   8 months ago Insomnia, unspecified type   Primary Care at [facility], Ines Bloomer, MD   8 months ago Situational anxiety   Primary Care at [facility], Ines Bloomer, MD      Future Appointments            In 3 months [place], Ines Bloomer, MD Primary Care at [place], [facility]

## 2019-11-29 ENCOUNTER — Other Ambulatory Visit: Payer: Self-pay

## 2019-11-29 ENCOUNTER — Ambulatory Visit (AMBULATORY_SURGERY_CENTER): Payer: Self-pay | Admitting: *Deleted

## 2019-11-29 VITALS — Temp 96.8°F | Ht 71.0 in | Wt 270.0 lb

## 2019-11-29 DIAGNOSIS — K2270 Barrett's esophagus without dysplasia: Secondary | ICD-10-CM

## 2019-11-29 NOTE — Progress Notes (Signed)
Second dose of covid vaccine 11-08-19 Pt is aware that care partner will wait in the car during procedure; if they feel like they will be too hot or cold to wait in the car; they may wait in the 4 th floor lobby. Patient is aware to bring only one care partner. We want them to wear a mask (we do not have any that we can provide them), practice social distancing, and we will check their temperatures when they get here.  I did remind the patient that their care partner needs to stay in the parking lot the entire time and have a cell phone available, we will call them when the pt is ready for discharge. Patient will wear mask into building.   No egg or soy allergy  No home oxygen use   No medications for weight loss taken   No trouble with anesthesia, difficulty with intubation or hx/fam hx of malignant hyperthermia per pt

## 2019-12-09 ENCOUNTER — Encounter: Payer: Self-pay | Admitting: Internal Medicine

## 2019-12-13 ENCOUNTER — Encounter: Payer: Self-pay | Admitting: Internal Medicine

## 2019-12-13 ENCOUNTER — Other Ambulatory Visit: Payer: Self-pay

## 2019-12-13 ENCOUNTER — Ambulatory Visit (AMBULATORY_SURGERY_CENTER): Payer: BC Managed Care – PPO | Admitting: Internal Medicine

## 2019-12-13 VITALS — BP 117/65 | HR 56 | Temp 97.1°F | Resp 25 | Ht 70.0 in | Wt 270.0 lb

## 2019-12-13 DIAGNOSIS — K449 Diaphragmatic hernia without obstruction or gangrene: Secondary | ICD-10-CM | POA: Diagnosis not present

## 2019-12-13 DIAGNOSIS — K2270 Barrett's esophagus without dysplasia: Secondary | ICD-10-CM | POA: Diagnosis not present

## 2019-12-13 MED ORDER — SODIUM CHLORIDE 0.9 % IV SOLN: 500.0000 mL | Freq: Once | INTRAVENOUS | Status: DC

## 2019-12-13 NOTE — Progress Notes (Signed)
Vitals-CW Temp-LC  Pt's states no medical or surgical changes since previsit or office visit.  

## 2019-12-13 NOTE — Patient Instructions (Signed)
YOU HAD AN ENDOSCOPIC PROCEDURE TODAY AT THE [facility]:   Refer to the procedure report that was given to you for any specific questions about what was found during the examination.  If the procedure report does not answer your questions, please call your gastroenterologist to clarify.  If you requested that your care partner not be given the details of your procedure findings, then the procedure report has been included in a sealed envelope for you to review at your convenience later.  YOU SHOULD EXPECT: Some feelings of bloating in the abdomen. Passage of more gas than usual.  Walking can help get rid of the air that was put into your GI tract during the procedure and reduce the bloating. If you had a lower endoscopy (such as a colonoscopy or flexible sigmoidoscopy) you may notice spotting of blood in your stool or on the toilet paper. If you underwent a bowel prep for your procedure, you may not have a normal bowel movement for a few days.  Please Note:  You might notice some irritation and congestion in your nose or some drainage.  This is from the oxygen used during your procedure.  There is no need for concern and it should clear up in a day or so.  SYMPTOMS TO REPORT IMMEDIATELY:    Following upper endoscopy (EGD)  Vomiting of blood or coffee ground material  New chest pain or pain under the shoulder blades  Painful or persistently difficult swallowing  New shortness of breath  Fever of 100F or higher  Black, tarry-looking stools  For urgent or emergent issues, a gastroenterologist can be reached at any hour by calling [phone]. Do not use MyChart messaging for urgent concerns.    DIET:  We do recommend a small meal at first, but then you may proceed to your regular diet.  Drink plenty of fluids but you should avoid alcoholic beverages for 24 hours.  ACTIVITY:  You should plan to take it easy for the rest of today and you should NOT DRIVE or use heavy machinery  until tomorrow (because of the sedation medicines used during the test).    FOLLOW UP: Our staff will call the number listed on your records 48-72 hours following your procedure to check on you and address any questions or concerns that you may have regarding the information given to you following your procedure. If we do not reach you, we will leave a message.  We will attempt to reach you two times.  During this call, we will ask if you have developed any symptoms of COVID 19. If you develop any symptoms (ie: fever, flu-like symptoms, shortness of breath, cough etc.) before then, please call [phone].  If you test positive for Covid 19 in the 2 weeks post procedure, please call and report this information to us.    If any biopsies were taken you will be contacted by phone or by letter within the next 1-3 weeks.  Please call us at [phone] if you have not heard about the biopsies in 3 weeks.    SIGNATURES/CONFIDENTIALITY: You and/or your care partner have signed paperwork which will be entered into your electronic medical record.  These signatures attest to the fact that that the information above on your After Visit Summary has been reviewed and is understood.  Full responsibility of the confidentiality of this discharge information lies with you and/or your care-partner. 

## 2019-12-13 NOTE — Progress Notes (Signed)
PT taken to PACU. Monitors in place. VSS. Report given to RN. 

## 2019-12-13 NOTE — Op Note (Signed)
[facility] Patient Name: Gregg Smith Procedure Date: 12/13/2019 10:22 AM MRN: [id number] Endoscopist: Docia Chuck. Henrene Pastor , MD Age: 74 Referring MD:  Date of Birth: April 01, 1946 Gender: Male Account #: [id number] Procedure:                Upper GI endoscopy with biopsies Indications:              Surveillance for malignancy due to personal history                            of Barrett's esophagus. Index examination 2016                            revealed small nodule read as Barrett's esophagus.                            Follow-up in 2017 revealed atypia in the same area.                            Follow-up 2019 revealed low-grade dysplasia. August                            2020 small dysplastic nodule was removed and                            ablated endoscopically. Follow-up 1 month later                            revealed nondysplastic Barrett's. He continues on                            twice daily lansoprazole. He is now for surveillance Medicines:                Monitored Anesthesia Care Procedure:                Pre-Anesthesia Assessment:                           - Prior to the procedure, a History and Physical                            was performed, and patient medications and                            allergies were reviewed. The patient's tolerance of                            previous anesthesia was also reviewed. The risks                            and benefits of the procedure and the sedation                            options and risks were discussed with the patient.  All questions were answered, and informed consent                            was obtained. Prior Anticoagulants: The patient has                            taken no previous anticoagulant or antiplatelet                            agents. ASA Grade Assessment: II - A patient with                            mild systemic disease. After reviewing the risks                          and benefits, the patient was deemed in                            satisfactory condition to undergo the procedure.                           After obtaining informed consent, the endoscope was                            passed under direct vision. Throughout the                            procedure, the patient's blood pressure, pulse, and                            oxygen saturations were monitored continuously. The                            Endoscope was introduced through the mouth, and                            advanced to the second part of duodenum. The upper                            GI endoscopy was accomplished without difficulty.                            The patient tolerated the procedure well. Scope In: Scope Out: Findings:                 The esophagus was normal. There was some scarring                            in the area of prior ablation therapy. Multiple                            biopsies were taken with a cold forceps for  histology in and around the area of prior therapy.                           The stomach was normal, save sliding hiatal hernia.                           The examined duodenum was normal with incidental                            brain gland hyperplasia noted.                           The cardia and gastric fundus were normal on                            retroflexion. Complications:            No immediate complications. Estimated Blood Loss:     Estimated blood loss: none. Impression:               1. No endoscopic evidence of Barrett's or residual                            nodule status post multiple biopsies                           2. Sliding hiatal hernia. Otherwise normal EGD. Recommendation:           - Patient has a contact number available for                            emergencies. The signs and symptoms of potential                            delayed complications were discussed  with the                            patient. Return to normal activities tomorrow.                            Written discharge instructions were provided to the                            patient.                           - Resume previous diet.                           - Continue present medications.                           - Await pathology results.                           - Follow-up EGD likely 6 to 12 months based on  final pathology results Docia Chuck. Henrene Pastor, MD 12/13/2019 10:36:37 AM This report has been signed electronically.

## 2019-12-13 NOTE — Progress Notes (Signed)
Called to room to assist during endoscopic procedure.  Patient ID and intended procedure confirmed with present staff. Received instructions for my participation in the procedure from the performing physician.  

## 2019-12-15 ENCOUNTER — Encounter: Payer: Self-pay | Admitting: Internal Medicine

## 2019-12-15 ENCOUNTER — Telehealth: Payer: Self-pay | Admitting: *Deleted

## 2019-12-15 NOTE — Telephone Encounter (Signed)
Called and left a message with answering service this morning at 7:31am returning our call. Advised Gregg Smith we were calling to see how he was doing after procedure. He verbalized doing very good. No problems or issues at this time. Assured that he had our office phone number just in case he has any problems in the future.

## 2019-12-15 NOTE — Telephone Encounter (Signed)
Attempted f/u phone call. No answer. Left message. °

## 2019-12-15 NOTE — Telephone Encounter (Signed)
  Follow up Call-  Call back number 12/13/2019 05/18/2019 04/28/2019 10/08/2017  Post procedure Call Back phone  # [phone]  Permission to leave phone message Yes Yes Yes Yes  Some recent data might be hidden     Patient questions:  Do you have a fever, pain , or abdominal swelling? No. Pain Score  0 *  Have you tolerated food without any problems? Yes.    Have you been able to return to your normal activities? Yes.    Do you have any questions about your discharge instructions: Diet   No. Medications  No. Follow up visit  No.  Do you have questions or concerns about your Care? No.  Actions: * If pain score is 4 or above: No action needed, pain <4.  1. Have you developed a fever since your procedure? no  2.   Have you had an respiratory symptoms (SOB or cough) since your procedure? no  3.   Have you tested positive for COVID 19 since your procedure no  4.   Have you had any family members/close contacts diagnosed with the COVID 19 since your procedure?  no   If yes to any of these questions please route to Joylene John, RN and Erenest Rasher, RN

## 2020-01-10 ENCOUNTER — Encounter: Payer: Self-pay | Admitting: Emergency Medicine

## 2020-01-10 ENCOUNTER — Telehealth (INDEPENDENT_AMBULATORY_CARE_PROVIDER_SITE_OTHER): Payer: BC Managed Care – PPO | Admitting: Emergency Medicine

## 2020-01-10 ENCOUNTER — Other Ambulatory Visit: Payer: Self-pay

## 2020-01-10 VITALS — Temp 98.0°F

## 2020-01-10 DIAGNOSIS — R0989 Other specified symptoms and signs involving the circulatory and respiratory systems: Secondary | ICD-10-CM

## 2020-01-10 NOTE — Progress Notes (Signed)
Telemedicine Encounter- SOAP NOTE Established Patient MyChart video conference attempted without success. This telephone encounter was conducted with the patient's (or proxy's) verbal consent via audio telecommunications: yes/no: Yes Patient was instructed to have this encounter in a suitably private space; and to only have persons present to whom they give permission to participate. In addition, patient identity was confirmed by use of name plus two identifiers (DOB and address).  I discussed the limitations, risks, security and privacy concerns of performing an evaluation and management service by telephone and the availability of in person appointments. I also discussed with the patient that there may be a patient responsible charge related to this service. The patient expressed understanding and agreed to proceed.  I spent a total of TIME; 0 MIN TO 60 MIN: 15 minutes talking with the patient or their proxy.  Chief Complaint  Patient presents with  . Fatigue    PT COMPLAINING OF ACHING, CONGESTION IN HIS LUNGS WITH A SLIGHT COUGH, STARTED ABOUT 1 MONTH AGO, DENIES FEVER,     Subjective   Gregg Smith is a 74 y.o. male established patient. Telephone visit today complaining of chest congestion for 1 month.  Worse when lying down.  Some upper chest discomfort.  Minimal cough.  Increased phlegm.  Denies difficulty breathing or any other significant symptoms.  HPI   Patient Active Problem List   Diagnosis Date Noted  . Generalized anxiety disorder   . Panic disorder   . Insomnia   . MDD (major depressive disorder), severe ([place]) 04/11/2019  . GERD (gastroesophageal reflux disease) 08/14/2017  . 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 OSA (obstructive sleep apnea) 03/22/2014  . Snoring disorder 05/09/2013  . Unspecified essential hypertension 02/11/2013  . Other and unspecified hyperlipidemia 02/11/2013  . Arthritis 02/11/2013    Past Medical History:   Diagnosis Date  . Allergy    seasonal   . Anxiety   . Arthritis   . Barrett's esophagus   . Cataract   . Chicken pox   . Colon polyps    hyperplastic  . Diverticulosis   . GERD (gastroesophageal reflux disease)   . Hemorrhoids   . Hyperlipidemia   . Hypertension   . Leg cramps   . Measles   . Mumps   . Pneumonia   . Rhinitis   . Sleep apnea    dental device  . Snoring disorder 05/09/2013    Current Outpatient Medications  Medication Sig Dispense Refill  . lansoprazole (PREVACID) 30 MG capsule Take 1 capsule (30 mg total) by mouth 2 (two) times daily before a meal. 180 capsule 3  . lisinopril-hydrochlorothiazide (ZESTORETIC) 10-12.5 MG tablet TAKE 1 TABLET BY MOUTH EVERY DAY 90 tablet 1  . Melatonin 5 MG TABS Take 5 mg by mouth at bedtime.    . Multiple Vitamin (MULTIVITAMIN) tablet Take 1 tablet by mouth daily.    . QUEtiapine (SEROQUEL) 100 MG tablet Take 1 tablet (100 mg total) by mouth at bedtime. (Patient taking differently: Take 100 mg by mouth at bedtime. Takes 25 mg) 30 tablet 0  . rosuvastatin (CRESTOR) 10 MG tablet TAKE 1 TABLET BY MOUTH EVERY DAY 90 tablet 0  . sucralfate (CARAFATE) 1 g tablet AVOID MORNING DOSE, AND TAKE 1 TABLET BY MOUTH AT LUNCH, DINNER AND BEDTIME 270 tablet 1  . tamsulosin (FLOMAX) 0.4 MG CAPS capsule TAKE 1 CAPSULE BY MOUTH DAILY AT NIGHT. 90 capsule 0  . traZODone (DESYREL) 50  MG tablet Take 1 tablet (50 mg total) by mouth at bedtime and may repeat dose one time if needed. 30 tablet 0   No current facility-administered medications for this visit.    Allergies  Allergen Reactions  . Penicillins Hives and Rash    Has patient had a PCN reaction causing immediate rash, facial/tongue/throat swelling, SOB or lightheadedness with hypotension:unsure Has patient had a PCN reaction causing severe rash involving mucus membranes or skin necrosis:unsure Has patient had a PCN reaction that required hospitalization:No Has patient had a PCN reaction  occurring within the last 10 years:No If all of the above answers are "NO", then may proceed with Cephalosporin use. Childhood reaction     Social History   Socioeconomic History  . Marital status: Widowed    Spouse name: [name]  . Number of children: 1  . Years of education: PHD  . Highest education level: Not on file  Occupational History    Comment: Professor,[employer]    Employer: [employer]  Tobacco Use  . Smoking status: Former Smoker    Packs/day: 1.00    Years: 40.00    Pack years: 40.00    Types: Cigarettes    Quit date: 01/19/2007    Years since quitting: 12.9  . Smokeless tobacco: Never Used  Substance and Sexual Activity  . Alcohol use: Yes    Alcohol/week: 2.0 standard drinks    Types: 2 Cans of beer per week  . Drug use: No  . Sexual activity: Yes    Birth control/protection: None  Other Topics Concern  . 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   Patient is working full-time.   Patient has a Ph.D   Patient has one adult child.   Patient is right-handed.   Patient drinks two cups of tea daily.   Social Determinants of Health   Financial Resource Strain:   . Difficulty of Paying Living Expenses:   Food Insecurity:   . Worried About Charity fundraiser in the Last Year:   . Arboriculturist in the Last Year:   Transportation Needs:   . Film/video editor (Medical):   Marland Kitchen Lack of Transportation (Non-Medical):   Physical Activity:   . Days of Exercise per Week:   . Minutes of Exercise per Session:   Stress:   . Feeling of Stress :   Social Connections:   . Frequency of Communication with Friends and Family:   . Frequency of Social Gatherings with Friends and Family:   . Attends Religious Services:   . Active Member of Clubs or Organizations:   . Attends Archivist Meetings:   Marland Kitchen Marital Status:   Intimate Partner Violence:   . Fear of Current or  Ex-Partner:   . Emotionally Abused:   Marland Kitchen Physically Abused:   . Sexually Abused:     Review of Systems  Constitutional: Negative for chills and fever.  HENT: Positive for congestion.   Respiratory: Positive for cough.   Cardiovascular: Negative.  Negative for chest pain and palpitations.  Gastrointestinal: Negative.  Negative for abdominal pain, blood in stool, diarrhea, nausea and vomiting.  Genitourinary: Negative for dysuria and hematuria.  Neurological: Negative.  Negative for dizziness and headaches.  All other systems reviewed and are negative.   Objective  Alert and oriented x3 in no apparent respiratory distress Vitals as reported by the patient:  Today's Vitals   01/10/20 0915  Temp: 98 F (36.7 C)    There are no diagnoses linked to this encounter. Dasan was seen today for fatigue.  Diagnoses and all orders for this visit:  Chest congestion  Needs office visit for further evaluation and treatment.  Scheduled for tomorrow morning.   I discussed the assessment and treatment plan with the patient. The patient was provided an opportunity to ask questions and all were answered. The patient agreed with the plan and demonstrated an understanding of the instructions.   The patient was advised to call back or seek an in-person evaluation if the symptoms worsen or if the condition fails to improve as anticipated.  I provided 15 minutes of non-face-to-face time during this encounter.  Horald Pollen, MD  Primary Care at [facility]

## 2020-01-10 NOTE — Patient Instructions (Signed)
° ° ° °  If you have lab work done today you will be contacted with your lab results within the next 2 weeks.  If you have not heard from us then please contact us. The fastest way to get your results is to register for My Chart. ° ° °IF you received an x-ray today, you will receive an invoice from [facility]. Please contact [facility] at [phone] with questions or concerns regarding your invoice.  ° °IF you received labwork today, you will receive an invoice from [facility]. Please contact [facility] at [phone] with questions or concerns regarding your invoice.  ° °Our billing staff will not be able to assist you with questions regarding bills from these companies. ° °You will be contacted with the lab results as soon as they are available. The fastest way to get your results is to activate your My Chart account. Instructions are located on the last page of this paperwork. If you have not heard from us regarding the results in 2 weeks, please contact this office. °  ° ° ° °

## 2020-01-11 ENCOUNTER — Encounter: Payer: Self-pay | Admitting: Emergency Medicine

## 2020-01-11 ENCOUNTER — Ambulatory Visit (INDEPENDENT_AMBULATORY_CARE_PROVIDER_SITE_OTHER): Payer: BC Managed Care – PPO

## 2020-01-11 ENCOUNTER — Ambulatory Visit: Payer: BC Managed Care – PPO | Admitting: Emergency Medicine

## 2020-01-11 ENCOUNTER — Other Ambulatory Visit: Payer: Self-pay

## 2020-01-11 VITALS — BP 130/77 | HR 73 | Temp 98.0°F | Ht 71.0 in | Wt 268.8 lb

## 2020-01-11 DIAGNOSIS — J42 Unspecified chronic bronchitis: Secondary | ICD-10-CM | POA: Diagnosis not present

## 2020-01-11 DIAGNOSIS — R079 Chest pain, unspecified: Secondary | ICD-10-CM

## 2020-01-11 MED ORDER — BUDESONIDE-FORMOTEROL FUMARATE 80-4.5 MCG/ACT IN AERO: 2.0000 | INHALATION_SPRAY | Freq: Two times a day (BID) | RESPIRATORY_TRACT | 3 refills | Status: DC

## 2020-01-11 MED ORDER — MUCINEX 600 MG PO TB12: 600.0000 mg | ORAL_TABLET | Freq: Two times a day (BID) | ORAL | 1 refills | Status: AC

## 2020-01-11 NOTE — Patient Instructions (Addendum)
If you have lab work done today you will be contacted with your lab results within the next 2 weeks.  If you have not heard from [place] then please contact us. The fastest way to get your results is to register for My Chart.   IF you received an x-ray today, you will receive an invoice from [facility]. Please contact [facility] at [phone] with questions or concerns regarding your invoice.   IF you received labwork today, you will receive an invoice from [place]. Please contact [facility] at [phone] with questions or concerns regarding your invoice.   Our billing staff will not be able to assist you with questions regarding bills from these companies.  You will be contacted with the lab results as soon as they are available. The fastest way to get your results is to activate your My Chart account. Instructions are located on the last page of this paperwork. If you have not heard from [place] regarding the results in 2 weeks, please contact this office.      Chronic Bronchitis, Adult Chronic bronchitis is long-lasting inflammation of the tubes that carry air into your lungs (bronchial tubes). This is inflammation that occurs:  On most days of the week.  For at least three months at a time.  Over a period of two years in a row. When the bronchial tubes are inflamed, they start to produce mucus. The inflammation and buildup of mucus make it more difficult to breathe. Chronic bronchitis is usually a permanent problem. It is one type of chronic obstructive pulmonary disease (COPD). People with chronic bronchitis are more likely to get frequent colds or respiratory infections. What are the causes? Chronic bronchitis most often occurs in people who:  Have chronic, severe asthma.  Have a history of smoking.  Have asthma and smoke.  Have certain lung diseases.  Have had long-term exposure to certain irritating fumes or chemicals. What are the signs or  symptoms? Symptoms of chronic bronchitis may include:  A cough that brings up mucus (productive cough).  Shortness of breath.  Loud breathing (wheezing).  Chest discomfort.  Frequent (recurring) colds or respiratory infections. Certain things can trigger chronic bronchitis symptoms or make them worse, such as:  Infections.  Stopping certain medicines.  Smoking.  Exposure to chemicals. How is this diagnosed? This condition may be diagnosed based on:  Your symptoms and medical history.  A physical exam.  A chest X-ray.  Lung (pulmonary) function tests. How is this treated? There is no cure for chronic bronchitis, but treatment can help control your symptoms. Treatment may include:  Using a cool mist vaporizer or humidifier to make it easier to breathe.  Drinking more fluids. Drinking more makes your mucus thinner, which may make it easier to breathe.  Lifestyle changes, such as eating a healthier diet and getting more exercise.  Medicines, such as: ? Inhalers to improve air flow in and out of your lungs. ? Antibiotics to treat any bacterial infections you have, such as:  Lung infection (pneumonia).  Sinus infection.  A sudden, severe (acute) episode of bronchitis.  Oxygen therapy.  Preventing infections by keeping up to date on vaccinations, including the pneumonia and flu vaccines.  Pulmonary rehabilitation. This is a program that helps you manage your breathing problems and improve your quality of life. It may last for up to 4-12 weeks and may include exercise programs, education, counseling, and treatment support. Follow these instructions at home: Medicines  Take over-the-counter and  prescription medicines only as told by your health care provider.  If you were prescribed an antibiotic medicine, take it as told by your health care provider. Do not stop taking the antibiotic even if you start to feel better. Preventing infections  Get vaccinations as  told by your health care provider. Make sure you get a flu shot (influenza vaccine) every year.  Wash your hands often with soap and water. If soap and water are not available, use hand sanitizer.  Avoid contact with people who have symptoms of a cold or the flu. Managing symptoms   Do not smoke, and avoid secondhand smoke. Exposure to cigarette smoke or irritating chemicals will make bronchitis worse. If you smoke and you need help quitting, ask your health care provider. Quitting smoking will help your lungs heal faster.  Use an inhaler, cool mist vaporizer, or humidifier as told by your health care provider.  Avoid pollen, dust, animal dander, molds, smoke, and other things that cause shortness of breath or wheezing attacks.  Use oxygen therapy at home as directed. Follow instructions from your health care provider about how to use oxygen safely and take precautions to prevent fire. Make sure you never smoke while using oxygen or allow others to smoke in your home.  Do not wait to get medical care if you have any concerning symptoms or trouble breathing. Waiting could cause permanent injury and may be life threatening. General instructions  Talk with your health care provider about what activities are safe for you and about possible exercise routines. Regular exercise is very important to help you feel better.  Drink enough fluids to keep your urine pale yellow.  Keep all follow-up visits as told by your health care provider. This is important. Contact a health care provider if:  You have coughing or shortness of breath that gets worse.  You have muscle aches.  You have chest pain.  Your mucus seems to get thicker.  Your mucus changes from clear or white to yellow, green, gray, or bloody. Get help right away if:  Your usual medicines do not stop your wheezing.  You have severe difficulty breathing. These symptoms may represent a serious problem that is an emergency. Do not  wait to see if the symptoms will go away. Get medical help right away. Call your local emergency services (911 in the [place] Do not drive yourself to the hospital. Summary  Chronic bronchitis is long-lasting inflammation of the tubes that carry air into your lungs (bronchial tubes).  Chronic bronchitis is usually a permanent problem. It is one type of chronic obstructive pulmonary disease (COPD).  There is no cure for chronic bronchitis, but treatment can help control your symptoms.  Do not smoke, and avoid secondhand smoke. Exposure to cigarette smoke or irritating chemicals will make bronchitis worse. This information is not intended to replace advice given to you by your health care provider. Make sure you discuss any questions you have with your health care provider. Document Revised: 06/10/2018 Document Reviewed: 07/08/2017 Elsevier Patient Education  [place].

## 2020-01-11 NOTE — Progress Notes (Signed)
Gregg Smith 74 y.o.   Chief Complaint  Patient presents with  . Chest Pain    6weeks    HISTORY OF PRESENT ILLNESS: This is a 74 y.o. male complaining of intermittent episodes of chest pain for the past 6 weeks.  States it is not pressure and at times changes with position.  Localized to mid to left chest area but at times it is epigastric.  Occasional dry cough with increased phlegm.  Denies fever or chills.  Has some dyspnea on exertion and easy fatigability.  No other significant symptoms. Has history of hypertension presently on Zestoretic 10-12.5 mg daily.  Also takes rosuvastatin 10 mg daily and Prevacid 30 mg daily. Fully vaccinated against Covid. No other complaints or medical concerns today.  HPI   Prior to Admission medications   Medication Sig Start Date End Date Taking? Authorizing Provider  lansoprazole (PREVACID) 30 MG capsule Take 1 capsule (30 mg total) by mouth 2 (two) times daily before a meal. 05/26/19  Yes Irene Shipper, MD  lisinopril-hydrochlorothiazide (ZESTORETIC) 10-12.5 MG tablet TAKE 1 TABLET BY MOUTH EVERY DAY 10/04/19  Yes Tiras Bianchini, Ines Bloomer, MD  Melatonin 5 MG TABS Take 5 mg by mouth at bedtime. 04/12/19  Yes [provider]  Multiple Vitamin (MULTIVITAMIN) tablet Take 1 tablet by mouth daily.   Yes [provider]  QUEtiapine (SEROQUEL) 100 MG tablet Take 1 tablet (100 mg total) by mouth at bedtime. Patient taking differently: Take 100 mg by mouth at bedtime. Takes 25 mg 04/16/19  Yes Derrill Center, NP  rosuvastatin (CRESTOR) 10 MG tablet TAKE 1 TABLET BY MOUTH EVERY DAY 11/26/19  Yes Rachna Schonberger, Ines Bloomer, MD  sodium fluoride (FLUORISHIELD) 1.1 % GEL dental gel Place onto teeth. 12/29/19  Yes [provider]  tamsulosin (FLOMAX) 0.4 MG CAPS capsule TAKE 1 CAPSULE BY MOUTH DAILY AT NIGHT. 11/26/19  Yes Echo Allsbrook, Ines Bloomer, MD  traZODone (DESYREL) 50 MG tablet Take 1 tablet (50 mg total) by mouth at bedtime and may repeat  dose one time if needed. 04/16/19  Yes Derrill Center, NP    Allergies  Allergen Reactions  . Penicillins Hives and Rash    Has patient had a PCN reaction causing immediate rash, facial/tongue/throat swelling, SOB or lightheadedness with hypotension:unsure Has patient had a PCN reaction causing severe rash involving mucus membranes or skin necrosis:unsure Has patient had a PCN reaction that required hospitalization:No Has patient had a PCN reaction occurring within the last 10 years:No If all of the above answers are "NO", then may proceed with Cephalosporin use. Childhood reaction     Patient Active Problem List   Diagnosis Date Noted  . Generalized anxiety disorder   . Panic disorder   . Insomnia   . MDD (major depressive disorder), severe ([place]) 04/11/2019  . GERD (gastroesophageal reflux disease) 08/14/2017  . 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 OSA (obstructive sleep apnea) 03/22/2014  . Snoring disorder 05/09/2013  . Unspecified essential hypertension 02/11/2013  . Other and unspecified hyperlipidemia 02/11/2013  . Arthritis 02/11/2013    Past Medical History:  Diagnosis Date  . Allergy    seasonal   . Anxiety   . Arthritis   . Barrett's esophagus   . Cataract   . Chicken pox   . Colon polyps    hyperplastic  . Diverticulosis   . GERD (gastroesophageal reflux disease)   . Hemorrhoids   . Hyperlipidemia   . Hypertension   .  Leg cramps   . Measles   . Mumps   . Pneumonia   . Rhinitis   . Sleep apnea    dental device  . Snoring disorder 05/09/2013    Past Surgical History:  Procedure Laterality Date  . APPENDECTOMY  1957  . CATARACT EXTRACTION    . COLONOSCOPY    . JOINT REPLACEMENT    . TONSILLECTOMY    . [place]  . 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 UPPER  GASTROINTESTINAL ENDOSCOPY      Social History   Socioeconomic History  . Marital status: Widowed    Spouse name: [name]  . Number of children: 1  . Years of education: PHD  . Highest education level: Not on file  Occupational History    Comment: Professor,[employer]    Employer: [employer]  Tobacco Use  . Smoking status: Former Smoker    Packs/day: 1.00    Years: 40.00    Pack years: 40.00    Types: Cigarettes    Quit date: 01/19/2007    Years since quitting: 12.9  . Smokeless tobacco: Never Used  Substance and Sexual Activity  . Alcohol use: Yes    Alcohol/week: 2.0 standard drinks    Types: 2 Cans of beer per week  . Drug use: No  . Sexual activity: Yes    Birth control/protection: None  Other Topics Concern  . 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   Patient is working full-time.   Patient has a Ph.D   Patient has one adult child.   Patient is right-handed.   Patient drinks two cups of tea daily.   Social Determinants of Health   Financial Resource Strain:   . Difficulty of Paying Living Expenses:   Food Insecurity:   . Worried About Charity fundraiser in the Last Year:   . Arboriculturist in the Last Year:   Transportation Needs:   . Film/video editor (Medical):   Marland Kitchen Lack of Transportation (Non-Medical):   Physical Activity:   . Days of Exercise per Week:   . Minutes of Exercise per Session:   Stress:   . Feeling of Stress :   Social Connections:   . Frequency of Communication with Friends and Family:   . Frequency of Social Gatherings with Friends and Family:   . Attends Religious Services:   . Active Member of Clubs or Organizations:   . Attends Archivist Meetings:   Marland Kitchen Marital Status:   Intimate Partner Violence:   . Fear of Current or Ex-Partner:   . Emotionally Abused:   Marland Kitchen Physically Abused:   . Sexually Abused:     Family History  Problem  Relation Age of Onset  . Alzheimer's disease Mother   . Cancer Father   . Stomach cancer Father   . Aortic aneurysm Son   . Heart disease Son   . Cancer Maternal Grandmother   . Heart disease Maternal Grandfather   . Stomach cancer Paternal Grandfather   . Colon cancer Neg Hx   . Colon polyps Neg Hx   . Esophageal cancer Neg Hx   . Rectal cancer Neg Hx   . Pancreatic cancer Neg Hx   .  Liver cancer Neg Hx      Review of Systems  Constitutional: Negative.  Negative for chills and fever.  HENT: Negative.  Negative for congestion and sore throat.   Respiratory: Positive for cough, sputum production and shortness of breath (Dyspnea on exertion). Negative for hemoptysis and wheezing.   Cardiovascular: Positive for chest pain. Negative for palpitations, orthopnea and PND.  Gastrointestinal: Negative.  Negative for abdominal pain, blood in stool, diarrhea, melena, nausea and vomiting.  Genitourinary: Negative.  Negative for dysuria and hematuria.  Musculoskeletal: Negative.  Negative for myalgias.  Skin: Negative.  Negative for rash.  Neurological: Negative.  Negative for dizziness and headaches.  Endo/Heme/Allergies: Negative.   All other systems reviewed and are negative.  Today's Vitals   01/11/20 0849  BP: 130/77  Pulse: 73  Temp: 98 F (36.7 C)  TempSrc: Temporal  SpO2: 96%  Weight: 268 lb 12.8 oz (121.9 kg)  Height: 5\' 11"  (1.803 m)   Body mass index is 37.49 kg/m.   Physical Exam Vitals reviewed.  Constitutional:      Appearance: He is well-developed.  HENT:     Head: Normocephalic.  Eyes:     Extraocular Movements: Extraocular movements intact.     Conjunctiva/sclera: Conjunctivae normal.     Pupils: Pupils are equal, round, and reactive to light.  Cardiovascular:     Rate and Rhythm: Normal rate and regular rhythm.     Pulses: Normal pulses.     Heart sounds: Normal heart sounds.  Pulmonary:     Effort: Pulmonary effort is normal.     Breath sounds:  Normal breath sounds.  Musculoskeletal:     Cervical back: Normal range of motion and neck supple.     Right lower leg: Edema present.     Left lower leg: Edema present.     Comments: Nonpitting edema both lower extremities  Skin:    General: Skin is warm and dry.     Capillary Refill: Capillary refill takes less than 2 seconds.  Neurological:     General: No focal deficit present.     Mental Status: He is alert and oriented to person, place, and time.  Psychiatric:        Mood and Affect: Mood normal.        Behavior: Behavior normal.    DG Chest 2 View  Result Date: 01/11/2020 CLINICAL DATA:  Chest congestion. EXAM: CHEST - 2 VIEW COMPARISON:  PA and lateral chest 02/11/2013. FINDINGS: Mild peribronchial thickening is unchanged. Lungs are clear without consolidative process, pneumothorax or effusion. Heart size is normal. No acute or focal bony abnormality. IMPRESSION: Chronic bronchitic change.  No acute disease. Electronically Signed   By: Inge Rise M.D.   On: 01/11/2020 09:52    EKG: Normal sinus rhythm with ventricular rate of 69/min.  No acute ischemic changes.  Normal EKG. ASSESSMENT & PLAN: Aristides was seen today for chest pain.  Diagnoses and all orders for this visit:  Chronic bronchitis, unspecified chronic bronchitis type ([place]) -     budesonide-formoterol (SYMBICORT) 80-4.5 MCG/ACT inhaler; Inhale 2 puffs into the lungs 2 (two) times daily.  Chest pain, unspecified type -     Lipid panel -     CBC with Differential/Platelet -     EKG 12-Lead -     Comprehensive metabolic panel -     DG Chest 2 View -     Ambulatory referral to Cardiology  Other orders -     guaiFENesin ([place])  600 MG 12 hr tablet; Take 1 tablet (600 mg total) by mouth 2 (two) times daily for 10 days.    Patient Instructions       If you have lab work done today you will be contacted with your lab results within the next 2 weeks.  If you have not heard from [place] then please contact  us. The fastest way to get your results is to register for My Chart.   IF you received an x-ray today, you will receive an invoice from [facility]. Please contact [facility] at [phone] with questions or concerns regarding your invoice.   IF you received labwork today, you will receive an invoice from [facility]. Please contact [facility] at [phone] with questions or concerns regarding your invoice.   Our billing staff will not be able to assist you with questions regarding bills from these companies.  You will be contacted with the lab results as soon as they are available. The fastest way to get your results is to activate your My Chart account. Instructions are located on the last page of this paperwork. If you have not heard from [place] regarding the results in 2 weeks, please contact this office.      Chronic Bronchitis, Adult Chronic bronchitis is long-lasting inflammation of the tubes that carry air into your lungs (bronchial tubes). This is inflammation that occurs:  On most days of the week.  For at least three months at a time.  Over a period of two years in a row. When the bronchial tubes are inflamed, they start to produce mucus. The inflammation and buildup of mucus make it more difficult to breathe. Chronic bronchitis is usually a permanent problem. It is one type of chronic obstructive pulmonary disease (COPD). People with chronic bronchitis are more likely to get frequent colds or respiratory infections. What are the causes? Chronic bronchitis most often occurs in people who:  Have chronic, severe asthma.  Have a history of smoking.  Have asthma and smoke.  Have certain lung diseases.  Have had long-term exposure to certain irritating fumes or chemicals. What are the signs or symptoms? Symptoms of chronic bronchitis may include:  A cough that brings up mucus (productive cough).  Shortness of breath.  Loud breathing (wheezing).  Chest  discomfort.  Frequent (recurring) colds or respiratory infections. Certain things can trigger chronic bronchitis symptoms or make them worse, such as:  Infections.  Stopping certain medicines.  Smoking.  Exposure to chemicals. How is this diagnosed? This condition may be diagnosed based on:  Your symptoms and medical history.  A physical exam.  A chest X-ray.  Lung (pulmonary) function tests. How is this treated? There is no cure for chronic bronchitis, but treatment can help control your symptoms. Treatment may include:  Using a cool mist vaporizer or humidifier to make it easier to breathe.  Drinking more fluids. Drinking more makes your mucus thinner, which may make it easier to breathe.  Lifestyle changes, such as eating a healthier diet and getting more exercise.  Medicines, such as: ? Inhalers to improve air flow in and out of your lungs. ? Antibiotics to treat any bacterial infections you have, such as:  Lung infection (pneumonia).  Sinus infection.  A sudden, severe (acute) episode of bronchitis.  Oxygen therapy.  Preventing infections by keeping up to date on vaccinations, including the pneumonia and flu vaccines.  Pulmonary rehabilitation. This is a program that helps you manage your breathing problems and improve your quality of  life. It may last for up to 4-12 weeks and may include exercise programs, education, counseling, and treatment support. Follow these instructions at home: Medicines  Take over-the-counter and prescription medicines only as told by your health care provider.  If you were prescribed an antibiotic medicine, take it as told by your health care provider. Do not stop taking the antibiotic even if you start to feel better. Preventing infections  Get vaccinations as told by your health care provider. Make sure you get a flu shot (influenza vaccine) every year.  Wash your hands often with soap and water. If soap and water are not  available, use hand sanitizer.  Avoid contact with people who have symptoms of a cold or the flu. Managing symptoms   Do not smoke, and avoid secondhand smoke. Exposure to cigarette smoke or irritating chemicals will make bronchitis worse. If you smoke and you need help quitting, ask your health care provider. Quitting smoking will help your lungs heal faster.  Use an inhaler, cool mist vaporizer, or humidifier as told by your health care provider.  Avoid pollen, dust, animal dander, molds, smoke, and other things that cause shortness of breath or wheezing attacks.  Use oxygen therapy at home as directed. Follow instructions from your health care provider about how to use oxygen safely and take precautions to prevent fire. Make sure you never smoke while using oxygen or allow others to smoke in your home.  Do not wait to get medical care if you have any concerning symptoms or trouble breathing. Waiting could cause permanent injury and may be life threatening. General instructions  Talk with your health care provider about what activities are safe for you and about possible exercise routines. Regular exercise is very important to help you feel better.  Drink enough fluids to keep your urine pale yellow.  Keep all follow-up visits as told by your health care provider. This is important. Contact a health care provider if:  You have coughing or shortness of breath that gets worse.  You have muscle aches.  You have chest pain.  Your mucus seems to get thicker.  Your mucus changes from clear or white to yellow, green, gray, or bloody. Get help right away if:  Your usual medicines do not stop your wheezing.  You have severe difficulty breathing. These symptoms may represent a serious problem that is an emergency. Do not wait to see if the symptoms will go away. Get medical help right away. Call your local emergency services (911 in the [place] Do not drive yourself to the  hospital. Summary  Chronic bronchitis is long-lasting inflammation of the tubes that carry air into your lungs (bronchial tubes).  Chronic bronchitis is usually a permanent problem. It is one type of chronic obstructive pulmonary disease (COPD).  There is no cure for chronic bronchitis, but treatment can help control your symptoms.  Do not smoke, and avoid secondhand smoke. Exposure to cigarette smoke or irritating chemicals will make bronchitis worse. This information is not intended to replace advice given to you by your health care provider. Make sure you discuss any questions you have with your health care provider. Document Revised: 06/10/2018 Document Reviewed: 07/08/2017 Elsevier Patient Education  2020 [employer].      Agustina Caroli, MD [facility]

## 2020-01-12 LAB — CBC WITH DIFFERENTIAL/PLATELET
Basophils Absolute: 0.1 10*3/uL (ref 0.0–0.2)
Basos: 1 %
EOS (ABSOLUTE): 0.2 10*3/uL (ref 0.0–0.4)
Eos: 2 %
Hematocrit: 42.4 % (ref 37.5–51.0)
Hemoglobin: 14.3 g/dL (ref 13.0–17.7)
Immature Grans (Abs): 0 10*3/uL (ref 0.0–0.1)
Immature Granulocytes: 1 %
Lymphocytes Absolute: 1.1 10*3/uL (ref 0.7–3.1)
Lymphs: 18 %
MCH: 28.9 pg (ref 26.6–33.0)
MCHC: 33.7 g/dL (ref 31.5–35.7)
MCV: 86 fL (ref 79–97)
Monocytes Absolute: 0.6 10*3/uL (ref 0.1–0.9)
Monocytes: 10 %
Neutrophils Absolute: 4.3 10*3/uL (ref 1.4–7.0)
Neutrophils: 68 %
Platelets: 168 10*3/uL (ref 150–450)
RBC: 4.94 x10E6/uL (ref 4.14–5.80)
RDW: 14 % (ref 11.6–15.4)
WBC: 6.3 10*3/uL (ref 3.4–10.8)

## 2020-01-12 LAB — COMPREHENSIVE METABOLIC PANEL
ALT: 37 IU/L (ref 0–44)
AST: 45 IU/L — ABNORMAL HIGH (ref 0–40)
Albumin/Globulin Ratio: 1.3 (ref 1.2–2.2)
Albumin: 3.9 g/dL (ref 3.7–4.7)
Alkaline Phosphatase: 107 IU/L (ref 39–117)
BUN/Creatinine Ratio: 18 (ref 10–24)
BUN: 17 mg/dL (ref 8–27)
Bilirubin Total: 0.5 mg/dL (ref 0.0–1.2)
CO2: 22 mmol/L (ref 20–29)
Calcium: 9 mg/dL (ref 8.6–10.2)
Chloride: 107 mmol/L — ABNORMAL HIGH (ref 96–106)
Creatinine, Ser: 0.95 mg/dL (ref 0.76–1.27)
GFR calc Af Amer: 91 mL/min/{1.73_m2} (ref >59)
GFR calc non Af Amer: 79 mL/min/{1.73_m2} (ref >59)
Globulin, Total: 3 g/dL (ref 1.5–4.5)
Glucose: 108 mg/dL — ABNORMAL HIGH (ref 65–99)
Potassium: 3.6 mmol/L (ref 3.5–5.2)
Sodium: 143 mmol/L (ref 134–144)
Total Protein: 6.9 g/dL (ref 6.0–8.5)

## 2020-01-12 LAB — LIPID PANEL
Chol/HDL Ratio: 4 ratio (ref 0.0–5.0)
Cholesterol, Total: 147 mg/dL (ref 100–199)
HDL: 37 mg/dL — ABNORMAL LOW (ref >39)
LDL Chol Calc (NIH): 85 mg/dL (ref 0–99)
Triglycerides: 143 mg/dL (ref 0–149)
VLDL Cholesterol Cal: 25 mg/dL (ref 5–40)

## 2020-01-13 ENCOUNTER — Encounter: Payer: Self-pay | Admitting: Emergency Medicine

## 2020-01-20 ENCOUNTER — Ambulatory Visit: Payer: BC Managed Care – PPO | Admitting: Cardiology

## 2020-01-20 ENCOUNTER — Encounter: Payer: Self-pay | Admitting: Cardiology

## 2020-01-20 ENCOUNTER — Other Ambulatory Visit: Payer: Self-pay

## 2020-01-20 VITALS — BP 136/82 | HR 71 | Temp 97.2°F | Ht 71.0 in | Wt 270.0 lb

## 2020-01-20 DIAGNOSIS — I10 Essential (primary) hypertension: Secondary | ICD-10-CM | POA: Diagnosis not present

## 2020-01-20 DIAGNOSIS — R079 Chest pain, unspecified: Secondary | ICD-10-CM | POA: Diagnosis not present

## 2020-01-20 DIAGNOSIS — R0600 Dyspnea, unspecified: Secondary | ICD-10-CM

## 2020-01-20 DIAGNOSIS — R0609 Other forms of dyspnea: Secondary | ICD-10-CM

## 2020-01-20 DIAGNOSIS — E785 Hyperlipidemia, unspecified: Secondary | ICD-10-CM

## 2020-01-20 MED ORDER — METOPROLOL TARTRATE 50 MG PO TABS
ORAL_TABLET | ORAL | 0 refills | Status: DC
Start: 2020-01-20 — End: 2020-10-24

## 2020-01-20 NOTE — Patient Instructions (Signed)
Medication Instructions:  Your physician recommends that you continue on your current medications as directed. Please refer to the Current Medication list given to you today.  Testing/Procedures: Your physician has requested that you have an echocardiogram. Echocardiography is a painless test that uses sound waves to create images of your heart. It provides your doctor with information about the size and shape of your heart and how well your heart's chambers and valves are working. This procedure takes approximately one hour. There are no restrictions for this procedure. This will be done at our [facility] location:  [place] has requested that you have cardiac CT. Cardiac computed tomography (CT) is a painless test that uses an x-ray machine to take clear, detailed pictures of your heart. For further information please visit [email or web address]. Please follow instruction sheet as given.  Follow-Up: At [facility], you and your health needs are our priority.  As part of our continuing mission to provide you with exceptional heart care, we have created designated Provider Care Teams.  These Care Teams include your primary Cardiologist (physician) and Advanced Practice Providers (APPs -  Physician Assistants and Nurse Practitioners) who all work together to provide you with the care you need, when you need it.  We recommend signing up for the patient portal called "MyChart".  Sign up information is provided on this After Visit Summary.  MyChart is used to connect with patients for Virtual Visits (Telemedicine).  Patients are able to view lab/test results, encounter notes, upcoming appointments, etc.  Non-urgent messages can be sent to your provider as well.   To learn more about what you can do with MyChart, go to [email or web address].    Your next appointment:   2 month(s)  The format for your next appointment:   In Person  Provider:   Oswaldo Milian, MD

## 2020-01-20 NOTE — Progress Notes (Signed)
Cardiology Office Note:    Date:  01/22/2020   ID:  Gregg Smith, DOB Apr 13, 1946, MRN [id number]  PCP:  Horald Pollen, MD  Cardiologist:  No primary care provider on file.  Electrophysiologist:  None   Referring MD: Horald Pollen, *   Chief Complaint  Patient presents with  . Chest Pain    History of Present Illness:    Gregg Smith is a 74 y.o. male with a hx of hypertension, hyperlipidemia, OSA, GERD, Barrett's esophagus who is referred by Dr. Mitchel Honour for evaluation of chest pain.  He reports that chest pain started 2 months ago.  States that it started more as upper abdominal pain but is since migrated into his chest.  Describes as dull aching pain in center of his chest.  Occurs intermittently.  Particularly notices it when he lies on his left side.  States is 2 out of 10 in intensity.  Typically would last for a couple of hours when it occurs.  He does not exercise regularly.  States the most exertion he does is gardening.  No clear relationship between chest pain and exertion.  Does report he gets dyspnea when walking upstairs or when he walks fast.  He denies any lightheadedness, syncope, palpitations, lower extremity edema.  He smoked up to 2 packs/day x 40 years, quit in 2008.  No history of heart disease in his immediate family.  Past Medical History:  Diagnosis Date  . Allergy    seasonal   . Anxiety   . Arthritis   . Barrett's esophagus   . Cataract   . Chicken pox   . Colon polyps    hyperplastic  . Diverticulosis   . GERD (gastroesophageal reflux disease)   . Hemorrhoids   . Hyperlipidemia   . Hypertension   . Leg cramps   . Measles   . Mumps   . Pneumonia   . Rhinitis   . Sleep apnea    dental device  . Snoring disorder 05/09/2013    Past Surgical History:  Procedure Laterality Date  . APPENDECTOMY  1957  . CATARACT EXTRACTION    . COLONOSCOPY    . JOINT REPLACEMENT    . TONSILLECTOMY    . [place]  . 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 UPPER GASTROINTESTINAL ENDOSCOPY      Current Medications: Current Meds  Medication Sig  . budesonide-formoterol (SYMBICORT) 80-4.5 MCG/ACT inhaler Inhale 2 puffs into the lungs 2 (two) times daily.  . [EXPIRED] guaiFENesin (MUCINEX) 600 MG 12 hr tablet Take 1 tablet (600 mg total) by mouth 2 (two) times daily for 10 days.  . lansoprazole (PREVACID) 30 MG capsule Take 1 capsule (30 mg total) by mouth 2 (two) times daily before a meal.  . lisinopril-hydrochlorothiazide (ZESTORETIC) 10-12.5 MG tablet TAKE 1 TABLET BY MOUTH EVERY DAY  . Melatonin 5 MG TABS Take 5 mg by mouth at bedtime.  . Multiple Vitamin (MULTIVITAMIN) tablet Take 1 tablet by mouth daily.  . QUEtiapine (SEROQUEL) 100 MG tablet Take 1 tablet (100 mg total) by mouth at bedtime. (Patient taking differently: Take 100 mg by mouth at bedtime. Takes 25 mg)  . rosuvastatin (CRESTOR) 10 MG tablet TAKE 1 TABLET BY MOUTH EVERY DAY  . sodium fluoride (FLUORISHIELD) 1.1 % GEL dental gel Place onto teeth.  . tamsulosin (  FLOMAX) 0.4 MG CAPS capsule TAKE 1 CAPSULE BY MOUTH DAILY AT NIGHT.  Marland Kitchen traZODone (DESYREL) 50 MG tablet Take 1 tablet (50 mg total) by mouth at bedtime and may repeat dose one time if needed.     Allergies:   Penicillins   Social History   Socioeconomic History  . Marital status: Widowed    Spouse name: [name]  . Number of children: 1  . Years of education: PHD  . Highest education level: Not on file  Occupational History    Comment: Professor,[employer]    Employer: [employer]  Tobacco Use  . Smoking status: Former Smoker    Packs/day: 1.00    Years: 40.00    Pack years: 40.00    Types: Cigarettes    Quit date: 01/19/2007    Years since quitting: 13.0  . Smokeless tobacco: Never Used  Substance and Sexual  Activity  . Alcohol use: Yes    Alcohol/week: 2.0 standard drinks    Types: 2 Cans of beer per week  . Drug use: No  . Sexual activity: Yes    Birth control/protection: None  Other Topics Concern  . 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   Patient is working full-time.   Patient has a Ph.D   Patient has one adult child.   Patient is right-handed.   Patient drinks two cups of tea daily.   Social Determinants of Health   Financial Resource Strain:   . Difficulty of Paying Living Expenses:   Food Insecurity:   . Worried About Charity fundraiser in the Last Year:   . Arboriculturist in the Last Year:   Transportation Needs:   . Film/video editor (Medical):   Marland Kitchen Lack of Transportation (Non-Medical):   Physical Activity:   . Days of Exercise per Week:   . Minutes of Exercise per Session:   Stress:   . Feeling of Stress :   Social Connections:   . Frequency of Communication with Friends and Family:   . Frequency of Social Gatherings with Friends and Family:   . Attends Religious Services:   . Active Member of Clubs or Organizations:   . Attends Archivist Meetings:   Marland Kitchen Marital Status:      Family History: The patient's family history includes Alzheimer's disease in his mother; Aortic aneurysm in his son; Cancer in his father and maternal grandmother; Heart disease in his maternal grandfather and son; Stomach cancer in his father and paternal grandfather. There is no history of Colon cancer, Colon polyps, Esophageal cancer, Rectal cancer, Pancreatic cancer, or Liver cancer.  ROS:   Please see the history of present illness.     All other systems reviewed and are negative.  EKGs/Labs/Other Studies Reviewed:    The following studies were reviewed today:  EKG:  EKG is ordered today.  The ekg ordered today demonstrates normal sinus rhythm, rate 71, no ST/T abnormalities  Recent Labs: 04/12/2019:  TSH 0.861 01/11/2020: ALT 37; BUN 17; Creatinine, Ser 0.95; Hemoglobin 14.3; Platelets 168; Potassium 3.6; Sodium 143  Recent Lipid Panel    Component Value Date/Time   CHOL 147 01/11/2020 1005   TRIG 143 01/11/2020 1005   HDL 37 (L) 01/11/2020 1005   CHOLHDL 4.0 01/11/2020 1005   CHOLHDL 4.0 04/11/2019 1824   VLDL 23 04/11/2019 1824   LDLCALC 85 01/11/2020 1005  LDLDIRECT 91 02/17/2014 1153    Physical Exam:    VS:  BP 136/82   Pulse 71   Temp (!) 97.2 F (36.2 C)   Ht 5\' 11"  (1.803 m)   Wt 270 lb (122.5 kg)   SpO2 95%   BMI 37.66 kg/m     Wt Readings from Last 3 Encounters:  01/20/20 270 lb (122.5 kg)  01/11/20 268 lb 12.8 oz (121.9 kg)  12/13/19 270 lb (122.5 kg)     GEN:  Well nourished, well developed in no acute distress HEENT: Normal NECK: No JVD; No carotid bruits CARDIAC: RRR, no murmurs, rubs, gallops RESPIRATORY:  Clear to auscultation without rales, wheezing or rhonchi  ABDOMEN: Soft, non-tender, non-distended MUSCULOSKELETAL:  No edema SKIN: Warm and dry NEUROLOGIC:  Alert and oriented x 3 PSYCHIATRIC:  Normal affect   ASSESSMENT:    1. Chest pain of uncertain etiology   2. DOE (dyspnea on exertion)   3. Essential hypertension   4. Hyperlipidemia, unspecified hyperlipidemia type    PLAN:     Chest pain/dyspnea on exertion: Chest pain is atypical in description, but does have significant CAD risk factors (age, hypertension, hyperlipidemia, tobacco use). -Coronary CTA -TTE  Hypertension: On lisinopril-hydrochlorothiazide 10-12.5 mg daily.  Appears controlled  Hyperlipidemia: On rosuvastatin 10 mg daily.  LDL 85 on 01/11/2020.  Will follow up coronary CTA, if significant CAD will increase rosuvastatin for goal LDL less than 70  RTC in 2 months   Medication Adjustments/Labs and Tests Ordered: Current medicines are reviewed at length with the patient today.  Concerns regarding medicines are outlined above.  Orders Placed This Encounter   Procedures  . CT CORONARY MORPH W/CTA COR W/SCORE W/CA W/CM &/OR WO/CM  . CT CORONARY FRACTIONAL FLOW RESERVE DATA PREP  . CT CORONARY FRACTIONAL FLOW RESERVE FLUID ANALYSIS  . EKG 12-Lead  . ECHOCARDIOGRAM COMPLETE   Meds ordered this encounter  Medications  . metoprolol tartrate (LOPRESSOR) 50 MG tablet    Sig: Take 50 mg (1 tablet) two hours prior to CT    Dispense:  1 tablet    Refill:  0    Patient Instructions  Medication Instructions:  Your physician recommends that you continue on your current medications as directed. Please refer to the Current Medication list given to you today.  Testing/Procedures: Your physician has requested that you have an echocardiogram. Echocardiography is a painless test that uses sound waves to create images of your heart. It provides your doctor with information about the size and shape of your heart and how well your heart's chambers and valves are working. This procedure takes approximately one hour. There are no restrictions for this procedure. This will be done at our [facility] location:  [place] has requested that you have cardiac CT. Cardiac computed tomography (CT) is a painless test that uses an x-ray machine to take clear, detailed pictures of your heart. For further information please visit [email or web address]. Please follow instruction sheet as given.  Follow-Up: At [facility], you and your health needs are our priority.  As part of our continuing mission to provide you with exceptional heart care, we have created designated Provider Care Teams.  These Care Teams include your primary Cardiologist (physician) and Advanced Practice Providers (APPs -  Physician Assistants and Nurse Practitioners) who all work together to provide you with the care you need, when you need it.  We recommend signing up for the patient portal called "  MyChart".  Sign up information is provided on this After Visit  Summary.  MyChart is used to connect with patients for Virtual Visits (Telemedicine).  Patients are able to view lab/test results, encounter notes, upcoming appointments, etc.  Non-urgent messages can be sent to your provider as well.   To learn more about what you can do with MyChart, go to [email or web address].    Your next appointment:   2 month(s)  The format for your next appointment:   In Person  Provider:   Oswaldo Milian, MD      Signed, Donato Heinz, MD  01/22/2020 1:16 PM    [place]

## 2020-02-13 ENCOUNTER — Other Ambulatory Visit: Payer: Self-pay | Admitting: *Deleted

## 2020-02-13 DIAGNOSIS — R079 Chest pain, unspecified: Secondary | ICD-10-CM

## 2020-02-13 DIAGNOSIS — R0609 Other forms of dyspnea: Secondary | ICD-10-CM

## 2020-02-13 DIAGNOSIS — Z01812 Encounter for preprocedural laboratory examination: Secondary | ICD-10-CM

## 2020-02-13 DIAGNOSIS — R0600 Dyspnea, unspecified: Secondary | ICD-10-CM

## 2020-02-13 DIAGNOSIS — I10 Essential (primary) hypertension: Secondary | ICD-10-CM

## 2020-02-15 ENCOUNTER — Other Ambulatory Visit: Payer: Self-pay

## 2020-02-15 ENCOUNTER — Other Ambulatory Visit: Payer: BC Managed Care – PPO | Admitting: *Deleted

## 2020-02-15 ENCOUNTER — Other Ambulatory Visit: Payer: Self-pay | Admitting: Cardiology

## 2020-02-15 ENCOUNTER — Ambulatory Visit (HOSPITAL_COMMUNITY): Payer: BC Managed Care – PPO | Attending: Cardiovascular Disease

## 2020-02-15 DIAGNOSIS — R079 Chest pain, unspecified: Secondary | ICD-10-CM

## 2020-02-15 DIAGNOSIS — R0609 Other forms of dyspnea: Secondary | ICD-10-CM

## 2020-02-15 DIAGNOSIS — R0600 Dyspnea, unspecified: Secondary | ICD-10-CM | POA: Diagnosis not present

## 2020-02-15 DIAGNOSIS — I10 Essential (primary) hypertension: Secondary | ICD-10-CM

## 2020-02-15 DIAGNOSIS — Z01812 Encounter for preprocedural laboratory examination: Secondary | ICD-10-CM

## 2020-02-15 LAB — BASIC METABOLIC PANEL
BUN/Creatinine Ratio: 17 (ref 10–24)
BUN: 19 mg/dL (ref 8–27)
CO2: 23 mmol/L (ref 20–29)
Calcium: 9.1 mg/dL (ref 8.6–10.2)
Chloride: 104 mmol/L (ref 96–106)
Creatinine, Ser: 1.11 mg/dL (ref 0.76–1.27)
GFR calc Af Amer: 76 mL/min/{1.73_m2} (ref >59)
GFR calc non Af Amer: 66 mL/min/{1.73_m2} (ref >59)
Glucose: 81 mg/dL (ref 65–99)
Potassium: 4.1 mmol/L (ref 3.5–5.2)
Sodium: 141 mmol/L (ref 134–144)

## 2020-02-16 ENCOUNTER — Telehealth (HOSPITAL_COMMUNITY): Payer: Self-pay | Admitting: *Deleted

## 2020-02-16 NOTE — Telephone Encounter (Signed)

## 2020-02-20 ENCOUNTER — Ambulatory Visit (HOSPITAL_COMMUNITY)
Admission: RE | Admit: 2020-02-20 | Discharge: 2020-02-20 | Disposition: A | Discharge status: Home / Self Care | Payer: BC Managed Care – PPO | Source: Ambulatory Visit | Attending: Cardiology | Admitting: Cardiology

## 2020-02-20 ENCOUNTER — Other Ambulatory Visit: Payer: Self-pay

## 2020-02-20 DIAGNOSIS — R079 Chest pain, unspecified: Secondary | ICD-10-CM | POA: Diagnosis not present

## 2020-02-20 DIAGNOSIS — R0600 Dyspnea, unspecified: Secondary | ICD-10-CM | POA: Diagnosis present

## 2020-02-20 DIAGNOSIS — R0609 Other forms of dyspnea: Secondary | ICD-10-CM

## 2020-02-20 MED ORDER — NITROGLYCERIN 0.4 MG SL SUBL
0.8000 mg | SUBLINGUAL_TABLET | Freq: Once | SUBLINGUAL | Status: AC
  Administered 2020-02-20: 0.8 mg via SUBLINGUAL

## 2020-02-20 MED ORDER — IOHEXOL 350 MG/ML SOLN
80.0000 mL | Freq: Once | INTRAVENOUS | Status: AC | PRN
  Administered 2020-02-20: 80 mL via INTRAVENOUS

## 2020-02-20 MED ORDER — NITROGLYCERIN 0.4 MG SL SUBL
SUBLINGUAL_TABLET | SUBLINGUAL | Status: AC
  Filled 2020-02-20: qty 2

## 2020-02-23 ENCOUNTER — Other Ambulatory Visit: Payer: Self-pay | Admitting: Emergency Medicine

## 2020-02-23 DIAGNOSIS — E785 Hyperlipidemia, unspecified: Secondary | ICD-10-CM

## 2020-03-04 ENCOUNTER — Other Ambulatory Visit: Payer: Self-pay | Admitting: Emergency Medicine

## 2020-03-04 DIAGNOSIS — I10 Essential (primary) hypertension: Secondary | ICD-10-CM

## 2020-03-04 NOTE — Telephone Encounter (Signed)
Requested Prescriptions  Pending Prescriptions Disp Refills   lisinopril-hydrochlorothiazide (ZESTORETIC) 10-12.5 MG tablet [Pharmacy Med Name: LISINOPRIL-HCTZ 10-12.5 MG TAB] 90 tablet 1    Sig: TAKE 1 TABLET BY MOUTH EVERY DAY     Cardiovascular:  ACEI + Diuretic Combos Passed - 03/04/2020  2:23 PM      Passed - Na in normal range and within 180 days    Sodium  Date Value Ref Range Status  02/15/2020 141 134 - 144 mmol/L Final         Passed - K in normal range and within 180 days    Potassium  Date Value Ref Range Status  02/15/2020 4.1 3.5 - 5.2 mmol/L Final         Passed - Cr in normal range and within 180 days    Creat  Date Value Ref Range Status  09/05/2015 1.04 0.70 - 1.25 mg/dL Final   Creatinine, Ser  Date Value Ref Range Status  02/15/2020 1.11 0.76 - 1.27 mg/dL Final         Passed - Ca in normal range and within 180 days    Calcium  Date Value Ref Range Status  02/15/2020 9.1 8.6 - 10.2 mg/dL Final         Passed - Patient is not pregnant      Passed - Last BP in normal range    BP Readings from Last 1 Encounters:  02/20/20 119/67         Passed - Valid encounter within last 6 months    Recent Outpatient Visits          1 month ago Chronic bronchitis, unspecified chronic bronchitis type [facility])   Primary Care at [facility], Ines Bloomer, MD   1 month ago Chest congestion   Primary Care at [place], Ines Bloomer, MD   6 months ago Routine general medical examination at a health care facility   Primary Care at [place], [place], MD   11 months ago Insomnia, unspecified type   Primary Care at [facility], [place], MD   11 months ago Insomnia, unspecified type   Primary Care at [facility], Ines Bloomer, MD      Future Appointments            In 2 days [place], Ines Bloomer, MD Primary Care at [place], [facility]   In 3 weeks Donato Heinz, MD [facility] [place], [facility]

## 2020-03-06 ENCOUNTER — Encounter: Payer: Self-pay | Admitting: Emergency Medicine

## 2020-03-06 ENCOUNTER — Ambulatory Visit (INDEPENDENT_AMBULATORY_CARE_PROVIDER_SITE_OTHER): Payer: BC Managed Care – PPO | Admitting: Emergency Medicine

## 2020-03-06 ENCOUNTER — Other Ambulatory Visit: Payer: Self-pay

## 2020-03-06 VITALS — BP 136/88 | HR 75 | Temp 97.8°F | Resp 15 | Ht 71.0 in | Wt 268.0 lb

## 2020-03-06 DIAGNOSIS — E785 Hyperlipidemia, unspecified: Secondary | ICD-10-CM | POA: Diagnosis not present

## 2020-03-06 DIAGNOSIS — Z87898 Personal history of other specified conditions: Secondary | ICD-10-CM

## 2020-03-06 DIAGNOSIS — I10 Essential (primary) hypertension: Secondary | ICD-10-CM | POA: Diagnosis not present

## 2020-03-06 NOTE — Progress Notes (Signed)
Gregg Smith 74 y.o.   Chief Complaint  Patient presents with  . Insomnia    pt has noted his medications are no longer helping him sleep like they had been, pt notes he doubled his normal dose and was then able to sleep through the night  . Hypertension    pt doing well no side effects from medication that he has noticed and denies any physical symptoms.     HISTORY OF PRESENT ILLNESS: This is a 74 y.o. male with history of hypertension and chronic insomnia here for follow-up. Takes trazodone, Seroquel, and melatonin.  Recently increased dose and is working better. Sees psychiatrist on a regular basis. Has history of hypertension on Zestoretic 10-12.5 mg daily.  Doing well. Also on rosuvastatin 10 mg daily. Lab Results  Component Value Date   CHOL 147 01/11/2020   HDL 37 (L) 01/11/2020   LDLCALC 85 01/11/2020   LDLDIRECT 91 02/17/2014   TRIG 143 01/11/2020   CHOLHDL 4.0 01/11/2020  Was seen by me last May when he was complaining of intermittent episodes of chest pain.  Saw cardiologist soon after and was evaluated with a coronary CT scan which was normal.  Not found to have significant coronary artery disease.  Chest pain of uncertain etiology.  BP Readings from Last 3 Encounters:  03/06/20 136/88  02/20/20 119/67  01/20/20 136/82  Fully vaccinated against Covid. No other complaints or medical concerns.  HPI   Prior to Admission medications   Medication Sig Start Date End Date Taking? Authorizing Provider  budesonide-formoterol (SYMBICORT) 80-4.5 MCG/ACT inhaler Inhale 2 puffs into the lungs 2 (two) times daily. 01/11/20  Yes Horald Pollen, MD  lansoprazole (PREVACID) 30 MG capsule Take 1 capsule (30 mg total) by mouth 2 (two) times daily before a meal. 05/26/19  Yes Irene Shipper, MD  lisinopril-hydrochlorothiazide (ZESTORETIC) 10-12.5 MG tablet TAKE 1 TABLET BY MOUTH EVERY DAY 03/04/20  Yes Cyrilla Durkin, Ines Bloomer, MD  Melatonin 5 MG TABS Take 5 mg by mouth at  bedtime. 04/12/19  Yes [provider]  metoprolol tartrate (LOPRESSOR) 50 MG tablet Take 50 mg (1 tablet) two hours prior to CT 01/20/20  Yes Donato Heinz, MD  Multiple Vitamin (MULTIVITAMIN) tablet Take 1 tablet by mouth daily.   Yes [provider]  QUEtiapine (SEROQUEL) 100 MG tablet Take 1 tablet (100 mg total) by mouth at bedtime. Patient taking differently: Take 100 mg by mouth at bedtime. Takes 25 mg 04/16/19  Yes Derrill Center, NP  rosuvastatin (CRESTOR) 10 MG tablet TAKE 1 TABLET BY MOUTH EVERY DAY 02/23/20  Yes Lemmie Vanlanen, Ines Bloomer, MD  sodium fluoride (FLUORISHIELD) 1.1 % GEL dental gel Place onto teeth. 12/29/19  Yes [provider]  tamsulosin (FLOMAX) 0.4 MG CAPS capsule TAKE 1 CAPSULE BY MOUTH DAILY AT NIGHT. 02/23/20  Yes Livie Vanderhoof, Ines Bloomer, MD  traZODone (DESYREL) 50 MG tablet Take 1 tablet (50 mg total) by mouth at bedtime and may repeat dose one time if needed. 04/16/19  Yes Derrill Center, NP    Allergies  Allergen Reactions  . Penicillins Hives and Rash    Has patient had a PCN reaction causing immediate rash, facial/tongue/throat swelling, SOB or lightheadedness with hypotension:unsure Has patient had a PCN reaction causing severe rash involving mucus membranes or skin necrosis:unsure Has patient had a PCN reaction that required hospitalization:No Has patient had a PCN reaction occurring within the last 10 years:No If all of the above answers are "NO", then  may proceed with Cephalosporin use. Childhood reaction     Patient Active Problem List   Diagnosis Date Noted  . Generalized anxiety disorder   . Panic disorder   . Insomnia   . MDD (major depressive disorder), severe ([place]) 04/11/2019  . GERD (gastroesophageal reflux disease) 08/14/2017  . 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 OSA (obstructive sleep apnea) 03/22/2014  . Snoring disorder 05/09/2013  . Unspecified essential hypertension 02/11/2013    . Other and unspecified hyperlipidemia 02/11/2013  . Arthritis 02/11/2013    Past Medical History:  Diagnosis Date  . Allergy    seasonal   . Anxiety   . Arthritis   . Barrett's esophagus   . Cataract   . Chicken pox   . Colon polyps    hyperplastic  . Diverticulosis   . GERD (gastroesophageal reflux disease)   . Hemorrhoids   . Hyperlipidemia   . Hypertension   . Leg cramps   . Measles   . Mumps   . Pneumonia   . Rhinitis   . Sleep apnea    dental device  . Snoring disorder 05/09/2013    Past Surgical History:  Procedure Laterality Date  . APPENDECTOMY  1957  . CATARACT EXTRACTION    . COLONOSCOPY    . JOINT REPLACEMENT    . TONSILLECTOMY    . [place]  . 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 UPPER GASTROINTESTINAL ENDOSCOPY      Social History   Socioeconomic History  . Marital status: Widowed    Spouse name: [name]  . Number of children: 1  . Years of education: PHD  . Highest education level: Not on file  Occupational History    Comment: Professor,[employer]    Employer: [employer]  Tobacco Use  . Smoking status: Former Smoker    Packs/day: 1.00    Years: 40.00    Pack years: 40.00    Types: Cigarettes    Quit date: 01/19/2007    Years since quitting: 13.1  . Smokeless tobacco: Never Used  Vaping Use  . Vaping Use: Never used  Substance and Sexual Activity  . Alcohol use: Yes    Alcohol/week: 2.0 standard drinks    Types: 2 Cans of beer per week  . Drug use: No  . Sexual activity: Yes    Birth control/protection: None  Other Topics Concern  . 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   Patient is working full-time.   Patient has a Ph.D   Patient has one adult child.   Patient is right-handed.    Patient drinks two cups of tea daily.   Social Determinants of Health   Financial Resource Strain:   . Difficulty of Paying Living Expenses:   Food Insecurity:   . Worried About Charity fundraiser in the Last Year:   . Arboriculturist in the Last Year:   Transportation Needs:   . Film/video editor (Medical):   Marland Kitchen Lack of Transportation (Non-Medical):   Physical Activity:   . Days of Exercise per Week:   . Minutes of Exercise per Session:   Stress:   . Feeling of Stress :   Social Connections:   .  Frequency of Communication with Friends and Family:   . Frequency of Social Gatherings with Friends and Family:   . Attends Religious Services:   . Active Member of Clubs or Organizations:   . Attends Archivist Meetings:   Marland Kitchen Marital Status:   Intimate Partner Violence:   . Fear of Current or Ex-Partner:   . Emotionally Abused:   Marland Kitchen Physically Abused:   . Sexually Abused:     Family History  Problem Relation Age of Onset  . Alzheimer's disease Mother   . Cancer Father   . Stomach cancer Father   . Aortic aneurysm Son   . Heart disease Son   . Cancer Maternal Grandmother   . Heart disease Maternal Grandfather   . Stomach cancer Paternal Grandfather   . Colon cancer Neg Hx   . Colon polyps Neg Hx   . Esophageal cancer Neg Hx   . Rectal cancer Neg Hx   . Pancreatic cancer Neg Hx   . Liver cancer Neg Hx      Review of Systems  Constitutional: Negative.  Negative for chills and fever.  HENT: Negative.  Negative for congestion and sore throat.   Respiratory: Negative.  Negative for cough and shortness of breath.   Cardiovascular: Negative.  Negative for chest pain and palpitations.  Gastrointestinal: Negative.  Negative for abdominal pain, diarrhea, nausea and vomiting.  Genitourinary: Negative.  Negative for dysuria and hematuria.  Musculoskeletal: Negative.  Negative for back pain, myalgias and neck pain.  Skin: Negative.  Negative for rash.  Neurological:  Negative.  Negative for dizziness and headaches.  Endo/Heme/Allergies: Negative.   All other systems reviewed and are negative.   Today's Vitals   03/06/20 0907 03/06/20 0919  BP: (!) 142/87 136/88  Pulse: 75   Resp: 15   Temp: 97.8 F (36.6 C)   TempSrc: Temporal   SpO2: 96%   Weight: 268 lb (121.6 kg)   Height: 5\' 11"  (1.803 m)    Body mass index is 37.38 kg/m.  Physical Exam Vitals reviewed.  Constitutional:      Appearance: Normal appearance.  HENT:     Head: Normocephalic.     Mouth/Throat:     Mouth: Mucous membranes are moist.     Pharynx: Oropharynx is clear.  Eyes:     Extraocular Movements: Extraocular movements intact.     Conjunctiva/sclera: Conjunctivae normal.     Pupils: Pupils are equal, round, and reactive to light.  Cardiovascular:     Rate and Rhythm: Normal rate and regular rhythm.     Pulses: Normal pulses.     Heart sounds: Normal heart sounds.  Pulmonary:     Effort: Pulmonary effort is normal.     Breath sounds: Normal breath sounds.  Abdominal:     General: There is no distension.     Palpations: Abdomen is soft.     Tenderness: There is no abdominal tenderness.  Musculoskeletal:        General: Normal range of motion.     Cervical back: Normal range of motion and neck supple.  Skin:    General: Skin is warm and dry.     Capillary Refill: Capillary refill takes less than 2 seconds.  Neurological:     General: No focal deficit present.     Mental Status: He is alert and oriented to person, place, and time.  Psychiatric:        Mood and Affect: Mood normal.  Behavior: Behavior normal.     A total of 30 minutes was spent with the patient, greater than 50% of which was in counseling/coordination of care regarding hypertension and cardiovascular risk associated with this condition, review of most recent office visit notes, review of most recent cardiologist evaluation, review of most recent blood work results, review of all  medications, diet and nutrition and need to increase physical activity, review of coronary CT scan, prognosis and need for follow-up.   ASSESSMENT & PLAN: Clinically stable.  No medical concerns identified during this visit.  Continue present medication.  No changes.  Follow-up in 6 months.  Micha was seen today for insomnia and hypertension.  Diagnoses and all orders for this visit:  Essential hypertension -     Comprehensive metabolic panel -     Lipid panel  Dyslipidemia  History of insomnia    Patient Instructions       If you have lab work done today you will be contacted with your lab results within the next 2 weeks.  If you have not heard from [place] then please contact us. The fastest way to get your results is to register for My Chart.   IF you received an x-ray today, you will receive an invoice from [facility]. Please contact [facility] at [phone] with questions or concerns regarding your invoice.   IF you received labwork today, you will receive an invoice from [facility]. Please contact [facility] at [phone] with questions or concerns regarding your invoice.   Our billing staff will not be able to assist you with questions regarding bills from these companies.  You will be contacted with the lab results as soon as they are available. The fastest way to get your results is to activate your My Chart account. Instructions are located on the last page of this paperwork. If you have not heard from [place] regarding the results in 2 weeks, please contact this office.     Hypertension, Adult High blood pressure (hypertension) is when the force of blood pumping through the arteries is too strong. The arteries are the blood vessels that carry blood from the heart throughout the body. Hypertension forces the heart to work harder to pump blood and may cause arteries to become narrow or stiff. Untreated or uncontrolled hypertension can cause a heart attack,  heart failure, a stroke, kidney disease, and other problems. A blood pressure reading consists of a higher number over a lower number. Ideally, your blood pressure should be below 120/80. The first ("top") number is called the systolic pressure. It is a measure of the pressure in your arteries as your heart beats. The second ("bottom") number is called the diastolic pressure. It is a measure of the pressure in your arteries as the heart relaxes. What are the causes? The exact cause of this condition is not known. There are some conditions that result in or are related to high blood pressure. What increases the risk? Some risk factors for high blood pressure are under your control. The following factors may make you more likely to develop this condition:  Smoking.  Having type 2 diabetes mellitus, high cholesterol, or both.  Not getting enough exercise or physical activity.  Being overweight.  Having too much fat, sugar, calories, or salt (sodium) in your diet.  Drinking too much alcohol. Some risk factors for high blood pressure may be difficult or impossible to change. Some of these factors include:  Having chronic kidney disease.  Having a family  history of high blood pressure.  Age. Risk increases with age.  Race. You may be at higher risk if you are African American.  Gender. Men are at higher risk than women before age 43. After age 30, women are at higher risk than men.  Having obstructive sleep apnea.  Stress. What are the signs or symptoms? High blood pressure may not cause symptoms. Very high blood pressure (hypertensive crisis) may cause:  Headache.  Anxiety.  Shortness of breath.  Nosebleed.  Nausea and vomiting.  Vision changes.  Severe chest pain.  Seizures. How is this diagnosed? This condition is diagnosed by measuring your blood pressure while you are seated, with your arm resting on a flat surface, your legs uncrossed, and your feet flat on the  floor. The cuff of the blood pressure monitor will be placed directly against the skin of your upper arm at the level of your heart. It should be measured at least twice using the same arm. Certain conditions can cause a difference in blood pressure between your right and left arms. Certain factors can cause blood pressure readings to be lower or higher than normal for a short period of time:  When your blood pressure is higher when you are in a health care provider's office than when you are at home, this is called white coat hypertension. Most people with this condition do not need medicines.  When your blood pressure is higher at home than when you are in a health care provider's office, this is called masked hypertension. Most people with this condition may need medicines to control blood pressure. If you have a high blood pressure reading during one visit or you have normal blood pressure with other risk factors, you may be asked to:  Return on a different day to have your blood pressure checked again.  Monitor your blood pressure at home for 1 week or longer. If you are diagnosed with hypertension, you may have other blood or imaging tests to help your health care provider understand your overall risk for other conditions. How is this treated? This condition is treated by making healthy lifestyle changes, such as eating healthy foods, exercising more, and reducing your alcohol intake. Your health care provider may prescribe medicine if lifestyle changes are not enough to get your blood pressure under control, and if:  Your systolic blood pressure is above 130.  Your diastolic blood pressure is above 80. Your personal target blood pressure may vary depending on your medical conditions, your age, and other factors. Follow these instructions at home: Eating and drinking   Eat a diet that is high in fiber and potassium, and low in sodium, added sugar, and fat. An example eating plan is  called the DASH (Dietary Approaches to Stop Hypertension) diet. To eat this way: ? Eat plenty of fresh fruits and vegetables. Try to fill one half of your plate at each meal with fruits and vegetables. ? Eat whole grains, such as whole-wheat pasta, brown rice, or whole-grain bread. Fill about one fourth of your plate with whole grains. ? Eat or drink low-fat dairy products, such as skim milk or low-fat yogurt. ? Avoid fatty cuts of meat, processed or cured meats, and poultry with skin. Fill about one fourth of your plate with lean proteins, such as fish, chicken without skin, beans, eggs, or tofu. ? Avoid pre-made and processed foods. These tend to be higher in sodium, added sugar, and fat.  Reduce your daily sodium intake. Most people  with hypertension should eat less than 1,500 mg of sodium a day.  Do not drink alcohol if: ? Your health care provider tells you not to drink. ? You are pregnant, may be pregnant, or are planning to become pregnant.  If you drink alcohol: ? Limit how much you use to:  0-1 drink a day for women.  0-2 drinks a day for men. ? Be aware of how much alcohol is in your drink. In the [place] one drink equals one 12 oz bottle of beer (355 mL), one 5 oz glass of wine (148 mL), or one 1 oz glass of hard liquor (44 mL). Lifestyle   Work with your health care provider to maintain a healthy body weight or to lose weight. Ask what an ideal weight is for you.  Get at least 30 minutes of exercise most days of the week. Activities may include walking, swimming, or biking.  Include exercise to strengthen your muscles (resistance exercise), such as Pilates or lifting weights, as part of your weekly exercise routine. Try to do these types of exercises for 30 minutes at least 3 days a week.  Do not use any products that contain nicotine or tobacco, such as cigarettes, e-cigarettes, and chewing tobacco. If you need help quitting, ask your health care provider.  Monitor your  blood pressure at home as told by your health care provider.  Keep all follow-up visits as told by your health care provider. This is important. Medicines  Take over-the-counter and prescription medicines only as told by your health care provider. Follow directions carefully. Blood pressure medicines must be taken as prescribed.  Do not skip doses of blood pressure medicine. Doing this puts you at risk for problems and can make the medicine less effective.  Ask your health care provider about side effects or reactions to medicines that you should watch for. Contact a health care provider if you:  Think you are having a reaction to a medicine you are taking.  Have headaches that keep coming back (recurring).  Feel dizzy.  Have swelling in your ankles.  Have trouble with your vision. Get help right away if you:  Develop a severe headache or confusion.  Have unusual weakness or numbness.  Feel faint.  Have severe pain in your chest or abdomen.  Vomit repeatedly.  Have trouble breathing. Summary  Hypertension is when the force of blood pumping through your arteries is too strong. If this condition is not controlled, it may put you at risk for serious complications.  Your personal target blood pressure may vary depending on your medical conditions, your age, and other factors. For most people, a normal blood pressure is less than 120/80.  Hypertension is treated with lifestyle changes, medicines, or a combination of both. Lifestyle changes include losing weight, eating a healthy, low-sodium diet, exercising more, and limiting alcohol. This information is not intended to replace advice given to you by your health care provider. Make sure you discuss any questions you have with your health care provider. Document Revised: 04/28/2018 Document Reviewed: 04/28/2018 Elsevier Patient Education  2020 [employer]       Agustina Caroli, MD [facility]

## 2020-03-06 NOTE — Patient Instructions (Addendum)
   If you have lab work done today you will be contacted with your lab results within the next 2 weeks.  If you have not heard from us then please contact us. The fastest way to get your results is to register for My Chart.   IF you received an x-ray today, you will receive an invoice from [facility]. Please contact [facility] at [phone] with questions or concerns regarding your invoice.   IF you received labwork today, you will receive an invoice from [facility]. Please contact [facility] at [phone] with questions or concerns regarding your invoice.   Our billing staff will not be able to assist you with questions regarding bills from these companies.  You will be contacted with the lab results as soon as they are available. The fastest way to get your results is to activate your My Chart account. Instructions are located on the last page of this paperwork. If you have not heard from us regarding the results in 2 weeks, please contact this office.      Hypertension, Adult High blood pressure (hypertension) is when the force of blood pumping through the arteries is too strong. The arteries are the blood vessels that carry blood from the heart throughout the body. Hypertension forces the heart to work harder to pump blood and may cause arteries to become narrow or stiff. Untreated or uncontrolled hypertension can cause a heart attack, heart failure, a stroke, kidney disease, and other problems. A blood pressure reading consists of a higher number over a lower number. Ideally, your blood pressure should be below 120/80. The first ("top") number is called the systolic pressure. It is a measure of the pressure in your arteries as your heart beats. The second ("bottom") number is called the diastolic pressure. It is a measure of the pressure in your arteries as the heart relaxes. What are the causes? The exact cause of this condition is not known. There are some conditions  that result in or are related to high blood pressure. What increases the risk? Some risk factors for high blood pressure are under your control. The following factors may make you more likely to develop this condition:  Smoking.  Having type 2 diabetes mellitus, high cholesterol, or both.  Not getting enough exercise or physical activity.  Being overweight.  Having too much fat, sugar, calories, or salt (sodium) in your diet.  Drinking too much alcohol. Some risk factors for high blood pressure may be difficult or impossible to change. Some of these factors include:  Having chronic kidney disease.  Having a family history of high blood pressure.  Age. Risk increases with age.  Race. You may be at higher risk if you are African American.  Gender. Men are at higher risk than women before age 45. After age 65, women are at higher risk than men.  Having obstructive sleep apnea.  Stress. What are the signs or symptoms? High blood pressure may not cause symptoms. Very high blood pressure (hypertensive crisis) may cause:  Headache.  Anxiety.  Shortness of breath.  Nosebleed.  Nausea and vomiting.  Vision changes.  Severe chest pain.  Seizures. How is this diagnosed? This condition is diagnosed by measuring your blood pressure while you are seated, with your arm resting on a flat surface, your legs uncrossed, and your feet flat on the floor. The cuff of the blood pressure monitor will be placed directly against the skin of your upper arm at the level of your   heart. It should be measured at least twice using the same arm. Certain conditions can cause a difference in blood pressure between your right and left arms. Certain factors can cause blood pressure readings to be lower or higher than normal for a short period of time:  When your blood pressure is higher when you are in a health care provider's office than when you are at home, this is called white coat hypertension.  Most people with this condition do not need medicines.  When your blood pressure is higher at home than when you are in a health care provider's office, this is called masked hypertension. Most people with this condition may need medicines to control blood pressure. If you have a high blood pressure reading during one visit or you have normal blood pressure with other risk factors, you may be asked to:  Return on a different day to have your blood pressure checked again.  Monitor your blood pressure at home for 1 week or longer. If you are diagnosed with hypertension, you may have other blood or imaging tests to help your health care provider understand your overall risk for other conditions. How is this treated? This condition is treated by making healthy lifestyle changes, such as eating healthy foods, exercising more, and reducing your alcohol intake. Your health care provider may prescribe medicine if lifestyle changes are not enough to get your blood pressure under control, and if:  Your systolic blood pressure is above 130.  Your diastolic blood pressure is above 80. Your personal target blood pressure may vary depending on your medical conditions, your age, and other factors. Follow these instructions at home: Eating and drinking   Eat a diet that is high in fiber and potassium, and low in sodium, added sugar, and fat. An example eating plan is called the DASH (Dietary Approaches to Stop Hypertension) diet. To eat this way: ? Eat plenty of fresh fruits and vegetables. Try to fill one half of your plate at each meal with fruits and vegetables. ? Eat whole grains, such as whole-wheat pasta, brown rice, or whole-grain bread. Fill about one fourth of your plate with whole grains. ? Eat or drink low-fat dairy products, such as skim milk or low-fat yogurt. ? Avoid fatty cuts of meat, processed or cured meats, and poultry with skin. Fill about one fourth of your plate with lean proteins, such  as fish, chicken without skin, beans, eggs, or tofu. ? Avoid pre-made and processed foods. These tend to be higher in sodium, added sugar, and fat.  Reduce your daily sodium intake. Most people with hypertension should eat less than 1,500 mg of sodium a day.  Do not drink alcohol if: ? Your health care provider tells you not to drink. ? You are pregnant, may be pregnant, or are planning to become pregnant.  If you drink alcohol: ? Limit how much you use to:  0-1 drink a day for women.  0-2 drinks a day for men. ? Be aware of how much alcohol is in your drink. In the [place] one drink equals one 12 oz bottle of beer (355 mL), one 5 oz glass of wine (148 mL), or one 1 oz glass of hard liquor (44 mL). Lifestyle   Work with your health care provider to maintain a healthy body weight or to lose weight. Ask what an ideal weight is for you.  Get at least 30 minutes of exercise most days of the week. Activities may include walking, swimming,   or biking.  Include exercise to strengthen your muscles (resistance exercise), such as Pilates or lifting weights, as part of your weekly exercise routine. Try to do these types of exercises for 30 minutes at least 3 days a week.  Do not use any products that contain nicotine or tobacco, such as cigarettes, e-cigarettes, and chewing tobacco. If you need help quitting, ask your health care provider.  Monitor your blood pressure at home as told by your health care provider.  Keep all follow-up visits as told by your health care provider. This is important. Medicines  Take over-the-counter and prescription medicines only as told by your health care provider. Follow directions carefully. Blood pressure medicines must be taken as prescribed.  Do not skip doses of blood pressure medicine. Doing this puts you at risk for problems and can make the medicine less effective.  Ask your health care provider about side effects or reactions to medicines that you  should watch for. Contact a health care provider if you:  Think you are having a reaction to a medicine you are taking.  Have headaches that keep coming back (recurring).  Feel dizzy.  Have swelling in your ankles.  Have trouble with your vision. Get help right away if you:  Develop a severe headache or confusion.  Have unusual weakness or numbness.  Feel faint.  Have severe pain in your chest or abdomen.  Vomit repeatedly.  Have trouble breathing. Summary  Hypertension is when the force of blood pumping through your arteries is too strong. If this condition is not controlled, it may put you at risk for serious complications.  Your personal target blood pressure may vary depending on your medical conditions, your age, and other factors. For most people, a normal blood pressure is less than 120/80.  Hypertension is treated with lifestyle changes, medicines, or a combination of both. Lifestyle changes include losing weight, eating a healthy, low-sodium diet, exercising more, and limiting alcohol. This information is not intended to replace advice given to you by your health care provider. Make sure you discuss any questions you have with your health care provider. Document Revised: 04/28/2018 Document Reviewed: 04/28/2018 Elsevier Patient Education  2020 Elsevier Inc.  

## 2020-03-07 LAB — COMPREHENSIVE METABOLIC PANEL
ALT: 25 IU/L (ref 0–44)
AST: 36 IU/L (ref 0–40)
Albumin/Globulin Ratio: 1.4 (ref 1.2–2.2)
Albumin: 4.2 g/dL (ref 3.7–4.7)
Alkaline Phosphatase: 82 IU/L (ref 48–121)
BUN/Creatinine Ratio: 18 (ref 10–24)
BUN: 18 mg/dL (ref 8–27)
Bilirubin Total: 0.4 mg/dL (ref 0.0–1.2)
CO2: 21 mmol/L (ref 20–29)
Calcium: 9 mg/dL (ref 8.6–10.2)
Chloride: 106 mmol/L (ref 96–106)
Creatinine, Ser: 0.98 mg/dL (ref 0.76–1.27)
GFR calc Af Amer: 88 mL/min/{1.73_m2} (ref >59)
GFR calc non Af Amer: 76 mL/min/{1.73_m2} (ref >59)
Globulin, Total: 3 g/dL (ref 1.5–4.5)
Glucose: 88 mg/dL (ref 65–99)
Potassium: 4.1 mmol/L (ref 3.5–5.2)
Sodium: 142 mmol/L (ref 134–144)
Total Protein: 7.2 g/dL (ref 6.0–8.5)

## 2020-03-07 LAB — LIPID PANEL
Chol/HDL Ratio: 3.9 ratio (ref 0.0–5.0)
Cholesterol, Total: 143 mg/dL (ref 100–199)
HDL: 37 mg/dL — ABNORMAL LOW (ref >39)
LDL Chol Calc (NIH): 83 mg/dL (ref 0–99)
Triglycerides: 125 mg/dL (ref 0–149)
VLDL Cholesterol Cal: 23 mg/dL (ref 5–40)

## 2020-03-26 NOTE — Progress Notes (Signed)
Cardiology Office Note:    Date:  03/28/2020   ID:  Gregg Smith, DOB September 16, 1945, MRN [id number]  PCP:  Horald Pollen, MD  Cardiologist:  No primary care provider on file.  Electrophysiologist:  None   Referring MD: Horald Pollen, *   Chief Complaint  Patient presents with  . Chest Pain    History of Present Illness:    Gregg Smith is a 74 y.o. male with a hx of hypertension, hyperlipidemia, OSA, GERD, Barrett's esophagus who presents for follow-up.  He was referred by Dr. Mitchel Honour for evaluation of chest pain, initially seen on 01/20/2020.  He reports that chest pain started 2 months prior.  States that it started more as upper abdominal pain but is since migrated into his chest.  Describes as dull aching pain in center of his chest.  Occurs intermittently.  Particularly notices it when he lies on his left side.  States is 2 out of 10 in intensity.  Typically would last for a couple of hours when it occurs.  He does not exercise regularly.  States the most exertion he does is gardening.  No clear relationship between chest pain and exertion.  Does report he gets dyspnea when walking upstairs or when he walks fast.  He denies any lightheadedness, syncope, palpitations, lower extremity edema.  He smoked up to 2 packs/day x 40 years, quit in 2008.  No history of heart disease in his immediate family.  Echocardiogram on 02/15/2020 showed normal biventricular function, grade 1 diastolic dysfunction, mild AI, RVSP 25 mmHg.  Coronary CTA on 02/20/2020 showed calcium score 0, no evidence of CAD, dilated pulmonary artery measuring 36 mm, dilated ascending aorta measuring 37 mm.  Since last clinic visit, he reports that he has been doing well.  States that he has had no further chest pain.  He denies any dyspnea, lightheadedness, or syncope.  He does not exercise, but reports he has been doing yard and garden work and denies any exertional symptoms   Wt Readings from Last 3  Encounters:  03/28/20 (!) 254 lb (115.2 kg)  03/06/20 268 lb (121.6 kg)  01/20/20 270 lb (122.5 kg)   BP Readings from Last 3 Encounters:  03/28/20 (!) 140/82  03/06/20 136/88  02/20/20 119/67     Past Medical History:  Diagnosis Date  . Allergy    seasonal   . Anxiety   . Arthritis   . Barrett's esophagus   . Cataract   . Chicken pox   . Colon polyps    hyperplastic  . Diverticulosis   . GERD (gastroesophageal reflux disease)   . Hemorrhoids   . Hyperlipidemia   . Hypertension   . Leg cramps   . Measles   . Mumps   . Pneumonia   . Rhinitis   . Sleep apnea    dental device  . Snoring disorder 05/09/2013    Past Surgical History:  Procedure Laterality Date  . APPENDECTOMY  1957  . CATARACT EXTRACTION    . COLONOSCOPY    . JOINT REPLACEMENT    . TONSILLECTOMY    . [place]  . 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 UPPER GASTROINTESTINAL ENDOSCOPY      Current Medications: Current Meds  Medication Sig  . budesonide-formoterol (SYMBICORT) 80-4.5 MCG/ACT inhaler Inhale 2 puffs into the lungs  2 (two) times daily.  Marland Kitchen CLINPRO 5000 1.1 % PSTE Place onto teeth daily.  . lansoprazole (PREVACID) 30 MG capsule Take 1 capsule (30 mg total) by mouth 2 (two) times daily before a meal.  . lisinopril-hydrochlorothiazide (ZESTORETIC) 10-12.5 MG tablet TAKE 1 TABLET BY MOUTH EVERY DAY  . Melatonin 5 MG TABS Take 5 mg by mouth at bedtime.  . metoprolol tartrate (LOPRESSOR) 50 MG tablet Take 50 mg (1 tablet) two hours prior to CT  . Multiple Vitamin (MULTIVITAMIN) tablet Take 1 tablet by mouth daily.  . QUEtiapine (SEROQUEL) 100 MG tablet Take 1 tablet (100 mg total) by mouth at bedtime. (Patient taking differently: Take 100 mg by mouth at bedtime. Takes 25 mg)  . rosuvastatin (CRESTOR) 10 MG tablet TAKE 1 TABLET BY  MOUTH EVERY DAY  . sodium fluoride (FLUORISHIELD) 1.1 % GEL dental gel Place onto teeth.  . tamsulosin (FLOMAX) 0.4 MG CAPS capsule TAKE 1 CAPSULE BY MOUTH DAILY AT NIGHT.  Marland Kitchen traZODone (DESYREL) 50 MG tablet Take 1 tablet (50 mg total) by mouth at bedtime and may repeat dose one time if needed.     Allergies:   Penicillins   Social History   Socioeconomic History  . Marital status: Widowed    Spouse name: [name]  . Number of children: 1  . Years of education: PHD  . Highest education level: Not on file  Occupational History    Comment: Professor,[employer]    Employer: [employer]  Tobacco Use  . Smoking status: Former Smoker    Packs/day: 1.00    Years: 40.00    Pack years: 40.00    Types: Cigarettes    Quit date: 01/19/2007    Years since quitting: 13.1  . Smokeless tobacco: Never Used  Vaping Use  . Vaping Use: Never used  Substance and Sexual Activity  . Alcohol use: Yes    Alcohol/week: 2.0 standard drinks    Types: 2 Cans of beer per week  . Drug use: No  . Sexual activity: Yes    Birth control/protection: None  Other Topics Concern  . 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   Patient is working full-time.   Patient has a Ph.D   Patient has one adult child.   Patient is right-handed.   Patient drinks two cups of tea daily.   Social Determinants of Health   Financial Resource Strain:   . Difficulty of Paying Living Expenses:   Food Insecurity:   . Worried About Charity fundraiser in the Last Year:   . Arboriculturist in the Last Year:   Transportation Needs:   . Film/video editor (Medical):   Marland Kitchen Lack of Transportation (Non-Medical):   Physical Activity:   . Days of Exercise per Week:   . Minutes of Exercise per Session:   Stress:   . Feeling of Stress :   Social Connections:   . Frequency of Communication with Friends and Family:   . Frequency of Social Gatherings with  Friends and Family:   . Attends Religious Services:   . Active Member of Clubs or Organizations:   . Attends Archivist Meetings:   Marland Kitchen Marital Status:      Family History: The patient's family history includes Alzheimer's disease in his mother; Aortic aneurysm in his son; Cancer in his father and maternal grandmother; Heart disease  in his maternal grandfather and son; Stomach cancer in his father and paternal grandfather. There is no history of Colon cancer, Colon polyps, Esophageal cancer, Rectal cancer, Pancreatic cancer, or Liver cancer.  ROS:   Please see the history of present illness.     All other systems reviewed and are negative.  EKGs/Labs/Other Studies Reviewed:    The following studies were reviewed today:  EKG:  EKG is not ordered today.  The ekg ordered at prior clinic visit demonstrates normal sinus rhythm, rate 71, no ST/T abnormalities  Recent Labs: 04/12/2019: TSH 0.861 01/11/2020: Hemoglobin 14.3; Platelets 168 03/06/2020: ALT 25; BUN 18; Creatinine, Ser 0.98; Potassium 4.1; Sodium 142  Recent Lipid Panel    Component Value Date/Time   CHOL 143 03/06/2020 1012   TRIG 125 03/06/2020 1012   HDL 37 (L) 03/06/2020 1012   CHOLHDL 3.9 03/06/2020 1012   CHOLHDL 4.0 04/11/2019 1824   VLDL 23 04/11/2019 1824   LDLCALC 83 03/06/2020 1012   LDLDIRECT 91 02/17/2014 1153    Physical Exam:    VS:  BP (!) 140/82   Pulse 84   Ht 5\' 11"  (1.803 m)   Wt (!) 254 lb (115.2 kg)   SpO2 96%   BMI 35.43 kg/m     Wt Readings from Last 3 Encounters:  03/28/20 (!) 254 lb (115.2 kg)  03/06/20 268 lb (121.6 kg)  01/20/20 270 lb (122.5 kg)     GEN:  Well nourished, well developed in no acute distress HEENT: Normal NECK: No JVD; No carotid bruits CARDIAC: RRR, no murmurs, rubs, gallops RESPIRATORY:  Clear to auscultation without rales, wheezing or rhonchi  ABDOMEN: Soft, non-tender, non-distended MUSCULOSKELETAL:  No edema SKIN: Warm and dry NEUROLOGIC:  Alert and  oriented x 3 PSYCHIATRIC:  Normal affect   ASSESSMENT:    1. Chest pain of uncertain etiology   2. DOE (dyspnea on exertion)   3. Essential hypertension   4. Hyperlipidemia, unspecified hyperlipidemia type    PLAN:    Chest pain/dyspnea on exertion: Echocardiogram on 02/15/2020 showed normal biventricular function, grade 1 diastolic dysfunction, mild AI, RVSP 25 mmHg.  Coronary CTA on 02/20/2020 showed calcium score 0, no evidence of CAD, dilated pulmonary artery measuring 36 mm, dilated ascending aorta measuring 37 mm.  No further cardiac work-up recommended  Hypertension: On lisinopril-hydrochlorothiazide 10-12.5 mg daily.  Mildly elevated in clinic on initial check today (140/82), improved to 128/78 on recheck.  Asked patient to monitor BP daily for next 2 weeks and call with results.  Hyperlipidemia: On rosuvastatin 10 mg daily.  LDL 85 on 01/11/2020.    RTC in 1 year  Medication Adjustments/Labs and Tests Ordered: Current medicines are reviewed at length with the patient today.  Concerns regarding medicines are outlined above.  No orders of the defined types were placed in this encounter.  No orders of the defined types were placed in this encounter.   Patient Instructions  Medication Instructions:  Your physician recommends that you continue on your current medications as directed. Please refer to the Current Medication list given to you today.  *If you need a refill on your cardiac medications before your next appointment, please call your pharmacy*  Follow-Up: At [facility], you and your health needs are our priority.  As part of our continuing mission to provide you with exceptional heart care, we have created designated Provider Care Teams.  These Care Teams include your primary Cardiologist (physician) and Advanced Practice Providers (APPs -  Physician Assistants  and Nurse Practitioners) who all work together to provide you with the care you need, when you need it.  We  recommend signing up for the patient portal called "MyChart".  Sign up information is provided on this After Visit Summary.  MyChart is used to connect with patients for Virtual Visits (Telemedicine).  Patients are able to view lab/test results, encounter notes, upcoming appointments, etc.  Non-urgent messages can be sent to your provider as well.   To learn more about what you can do with MyChart, go to [email or web address].    Your next appointment:   12 month(s)  The format for your next appointment:   In Person  Provider:   Oswaldo Milian, MD   Other Instructions Please check your blood pressure at home daily, write it down.  Call the office or send message via Mychart with the readings in 2 weeks for Dr. Gardiner Rhyme to review.   We recommend getting a blood pressure monitor for home.  We recommend Omron upper arm cuff.        Signed, Donato Heinz, MD  03/28/2020 9:25 AM    [facility]

## 2020-03-28 ENCOUNTER — Ambulatory Visit: Payer: BC Managed Care – PPO | Admitting: Cardiology

## 2020-03-28 ENCOUNTER — Other Ambulatory Visit: Payer: Self-pay

## 2020-03-28 ENCOUNTER — Encounter: Payer: Self-pay | Admitting: Cardiology

## 2020-03-28 VITALS — BP 140/82 | HR 84 | Ht 71.0 in | Wt 254.0 lb

## 2020-03-28 DIAGNOSIS — R0600 Dyspnea, unspecified: Secondary | ICD-10-CM | POA: Diagnosis not present

## 2020-03-28 DIAGNOSIS — R079 Chest pain, unspecified: Secondary | ICD-10-CM | POA: Diagnosis not present

## 2020-03-28 DIAGNOSIS — I10 Essential (primary) hypertension: Secondary | ICD-10-CM

## 2020-03-28 DIAGNOSIS — E785 Hyperlipidemia, unspecified: Secondary | ICD-10-CM | POA: Diagnosis not present

## 2020-03-28 DIAGNOSIS — R0609 Other forms of dyspnea: Secondary | ICD-10-CM

## 2020-03-28 NOTE — Patient Instructions (Signed)
Medication Instructions:  Your physician recommends that you continue on your current medications as directed. Please refer to the Current Medication list given to you today.  *If you need a refill on your cardiac medications before your next appointment, please call your pharmacy*  Follow-Up: At [facility], you and your health needs are our priority.  As part of our continuing mission to provide you with exceptional heart care, we have created designated Provider Care Teams.  These Care Teams include your primary Cardiologist (physician) and Advanced Practice Providers (APPs -  Physician Assistants and Nurse Practitioners) who all work together to provide you with the care you need, when you need it.  We recommend signing up for the patient portal called "MyChart".  Sign up information is provided on this After Visit Summary.  MyChart is used to connect with patients for Virtual Visits (Telemedicine).  Patients are able to view lab/test results, encounter notes, upcoming appointments, etc.  Non-urgent messages can be sent to your provider as well.   To learn more about what you can do with MyChart, go to [email or web address].    Your next appointment:   12 month(s)  The format for your next appointment:   In Person  Provider:   Oswaldo Milian, MD   Other Instructions Please check your blood pressure at home daily, write it down.  Call the office or send message via Mychart with the readings in 2 weeks for Dr. Gardiner Rhyme to review.   We recommend getting a blood pressure monitor for home.  We recommend Omron upper arm cuff.

## 2020-06-09 ENCOUNTER — Other Ambulatory Visit: Payer: Self-pay | Admitting: Emergency Medicine

## 2020-06-09 DIAGNOSIS — E785 Hyperlipidemia, unspecified: Secondary | ICD-10-CM

## 2020-06-09 DIAGNOSIS — J42 Unspecified chronic bronchitis: Secondary | ICD-10-CM

## 2020-06-09 NOTE — Telephone Encounter (Signed)
Requested Prescriptions  Pending Prescriptions Disp Refills   rosuvastatin (CRESTOR) 10 MG tablet [Pharmacy Med Name: ROSUVASTATIN CALCIUM 10 MG TAB] 90 tablet     Sig: TAKE 1 TABLET BY MOUTH EVERY DAY     Cardiovascular:  Antilipid - Statins Failed - 06/09/2020 12:13 PM      Failed - LDL in normal range and within 360 days    LDL Chol Calc (NIH)  Date Value Ref Range Status  03/06/2020 83 0 - 99 mg/dL Final   Direct LDL  Date Value Ref Range Status  02/17/2014 91 mg/dL Final    Comment:    ATP III Classification (LDL):       < 100        mg/dL         Optimal      100 - 129     mg/dL         Near or Above Optimal      130 - 159     mg/dL         Borderline High      160 - 189     mg/dL         High       > 190        mg/dL         Very High           Failed - HDL in normal range and within 360 days    HDL  Date Value Ref Range Status  03/06/2020 37 (L) >39 mg/dL Final         Passed - Total Cholesterol in normal range and within 360 days    Cholesterol, Total  Date Value Ref Range Status  03/06/2020 143 100 - 199 mg/dL Final         Passed - Triglycerides in normal range and within 360 days    Triglycerides  Date Value Ref Range Status  03/06/2020 125 0 - 149 mg/dL Final         Passed - Patient is not pregnant      Passed - Valid encounter within last 12 months    Recent Outpatient Visits          3 months ago Essential hypertension   Primary [facility] at [place], [place], MD   5 months ago Chronic bronchitis, unspecified chronic bronchitis type [facility])   Primary Care at [facility], [place] Bloomer, MD   5 months ago Chest congestion   Primary Care at [facility], Ines Bloomer, MD   9 months ago Routine general medical examination at a health care facility   Primary Care at [place], Ines Bloomer, MD   1 year ago Insomnia, unspecified type   Primary Care at [facility], Ines Bloomer, MD      Future Appointments            In 2 months  Sagard[place], Ines Bloomer, MD Primary Care at [place], [place] 80-4.5 MCG/ACT inhaler [Pharmacy Med Name: SYMBICORT 80-4.5 MCG INHALER] 30.6 each 1    Sig: TAKE 2 [place]     Pulmonology:  Combination Products Passed - 06/09/2020 12:13 PM      Passed - Valid encounter within last 12 months    Recent Outpatient Visits          3 months ago Essential hypertension   Primary Care  at [facility], [place], [place]   5 months ago Chronic bronchitis, unspecified chronic bronchitis type [facility])   Primary Care at [facility], Ines Bloomer, MD   5 months ago Chest congestion   Primary Care at [facility], Ines Bloomer, MD   9 months ago Routine general medical examination at a health care facility   [place] [facility] [place], Ines Bloomer, MD   1 year ago Insomnia, unspecified type   Primary Care at [facility], Ines Bloomer, MD      Future Appointments            In 2 months Sagardia, Ines Bloomer, MD Primary Care at [place], [facility]

## 2020-08-09 ENCOUNTER — Other Ambulatory Visit: Payer: Self-pay | Admitting: Internal Medicine

## 2020-08-09 DIAGNOSIS — K2270 Barrett's esophagus without dysplasia: Secondary | ICD-10-CM

## 2020-08-09 DIAGNOSIS — K219 Gastro-esophageal reflux disease without esophagitis: Secondary | ICD-10-CM

## 2020-08-24 ENCOUNTER — Other Ambulatory Visit: Payer: Self-pay | Admitting: Emergency Medicine

## 2020-08-24 DIAGNOSIS — E785 Hyperlipidemia, unspecified: Secondary | ICD-10-CM

## 2020-08-26 NOTE — Telephone Encounter (Signed)
Requested Prescriptions  Pending Prescriptions Disp Refills   rosuvastatin (CRESTOR) 10 MG tablet [Pharmacy Med Name: ROSUVASTATIN CALCIUM 10 MG TAB] 90 tablet 1    Sig: TAKE 1 TABLET BY MOUTH EVERY DAY     Cardiovascular:  Antilipid - Statins Failed - 08/24/2020 12:27 PM      Failed - LDL in normal range and within 360 days    LDL Chol Calc (NIH)  Date Value Ref Range Status  03/06/2020 83 0 - 99 mg/dL Final   Direct LDL  Date Value Ref Range Status  02/17/2014 91 mg/dL Final    Comment:    ATP III Classification (LDL):       < 100        mg/dL         Optimal      100 - 129     mg/dL         Near or Above Optimal      130 - 159     mg/dL         Borderline High      160 - 189     mg/dL         High       > 190        mg/dL         Very High           Failed - HDL in normal range and within 360 days    HDL  Date Value Ref Range Status  03/06/2020 37 (L) >39 mg/dL Final         Passed - Total Cholesterol in normal range and within 360 days    Cholesterol, Total  Date Value Ref Range Status  03/06/2020 143 100 - 199 mg/dL Final         Passed - Triglycerides in normal range and within 360 days    Triglycerides  Date Value Ref Range Status  03/06/2020 125 0 - 149 mg/dL Final         Passed - Patient is not pregnant      Passed - Valid encounter within last 12 months    Recent Outpatient Visits          5 months ago Essential hypertension   Primary Care at [place], [place], MD   7 months ago Chronic bronchitis, unspecified chronic bronchitis type [facility])   Primary Care at [place], [place]   7 months ago Chest congestion   Primary Care at [facility], Ines Bloomer, MD   11 months ago Routine general medical examination at a health care facility   Primary Care at [place], Ines Bloomer, MD   1 year ago Insomnia, unspecified type   Primary Care at [facility], Ines Bloomer, MD      Future Appointments            In 1 week  [place], Ines Bloomer, MD Primary Care at [place], [facility]

## 2020-09-03 ENCOUNTER — Ambulatory Visit: Payer: BC Managed Care – PPO | Admitting: Emergency Medicine

## 2020-09-04 ENCOUNTER — Other Ambulatory Visit: Payer: Self-pay | Admitting: Emergency Medicine

## 2020-09-04 DIAGNOSIS — I10 Essential (primary) hypertension: Secondary | ICD-10-CM

## 2020-09-04 NOTE — Telephone Encounter (Signed)
Pt has appt tomorrow 09/05/20

## 2020-09-05 ENCOUNTER — Ambulatory Visit (INDEPENDENT_AMBULATORY_CARE_PROVIDER_SITE_OTHER): Payer: BC Managed Care – PPO | Admitting: Emergency Medicine

## 2020-09-05 ENCOUNTER — Other Ambulatory Visit: Payer: Self-pay

## 2020-09-05 ENCOUNTER — Encounter: Payer: Self-pay | Admitting: Emergency Medicine

## 2020-09-05 VITALS — BP 125/75 | HR 72 | Temp 98.2°F | Resp 16 | Ht 71.0 in | Wt 269.0 lb

## 2020-09-05 DIAGNOSIS — I10 Essential (primary) hypertension: Secondary | ICD-10-CM

## 2020-09-05 DIAGNOSIS — Z87898 Personal history of other specified conditions: Secondary | ICD-10-CM | POA: Diagnosis not present

## 2020-09-05 DIAGNOSIS — Z8719 Personal history of other diseases of the digestive system: Secondary | ICD-10-CM | POA: Diagnosis not present

## 2020-09-05 DIAGNOSIS — E785 Hyperlipidemia, unspecified: Secondary | ICD-10-CM | POA: Diagnosis not present

## 2020-09-05 DIAGNOSIS — Z8659 Personal history of other mental and behavioral disorders: Secondary | ICD-10-CM

## 2020-09-05 NOTE — Patient Instructions (Addendum)
   If you have lab work done today you will be contacted with your lab results within the next 2 weeks.  If you have not heard from us then please contact us. The fastest way to get your results is to register for My Chart.   IF you received an x-ray today, you will receive an invoice from [facility]. Please contact [facility] at [phone] with questions or concerns regarding your invoice.   IF you received labwork today, you will receive an invoice from [facility]. Please contact [facility] at [phone] with questions or concerns regarding your invoice.   Our billing staff will not be able to assist you with questions regarding bills from these companies.  You will be contacted with the lab results as soon as they are available. The fastest way to get your results is to activate your My Chart account. Instructions are located on the last page of this paperwork. If you have not heard from us regarding the results in 2 weeks, please contact this office.     Health Maintenance After Age 65 After age 65, you are at a higher risk for certain long-term diseases and infections as well as injuries from falls. Falls are a major cause of broken bones and head injuries in people who are older than age 65. Getting regular preventive care can help to keep you healthy and well. Preventive care includes getting regular testing and making lifestyle changes as recommended by your health care provider. Talk with your health care provider about:  Which screenings and tests you should have. A screening is a test that checks for a disease when you have no symptoms.  A diet and exercise plan that is right for you. What should I know about screenings and tests to prevent falls? Screening and testing are the best ways to find a health problem early. Early diagnosis and treatment give you the best chance of managing medical conditions that are common after age 65. Certain conditions and  lifestyle choices may make you more likely to have a fall. Your health care provider may recommend:  Regular vision checks. Poor vision and conditions such as cataracts can make you more likely to have a fall. If you wear glasses, make sure to get your prescription updated if your vision changes.  Medicine review. Work with your health care provider to regularly review all of the medicines you are taking, including over-the-counter medicines. Ask your health care provider about any side effects that may make you more likely to have a fall. Tell your health care provider if any medicines that you take make you feel dizzy or sleepy.  Osteoporosis screening. Osteoporosis is a condition that causes the bones to get weaker. This can make the bones weak and cause them to break more easily.  Blood pressure screening. Blood pressure changes and medicines to control blood pressure can make you feel dizzy.  Strength and balance checks. Your health care provider may recommend certain tests to check your strength and balance while standing, walking, or changing positions.  Foot health exam. Foot pain and numbness, as well as not wearing proper footwear, can make you more likely to have a fall.  Depression screening. You may be more likely to have a fall if you have a fear of falling, feel emotionally low, or feel unable to do activities that you used to do.  Alcohol use screening. Using too much alcohol can affect your balance and may make you more likely to   have a fall. What actions can I take to lower my risk of falls? General instructions  Talk with your health care provider about your risks for falling. Tell your health care provider if: ? You fall. Be sure to tell your health care provider about all falls, even ones that seem minor. ? You feel dizzy, sleepy, or off-balance.  Take over-the-counter and prescription medicines only as told by your health care provider. These include any  supplements.  Eat a healthy diet and maintain a healthy weight. A healthy diet includes low-fat dairy products, low-fat (lean) meats, and fiber from whole grains, beans, and lots of fruits and vegetables. Home safety  Remove any tripping hazards, such as rugs, cords, and clutter.  Install safety equipment such as grab bars in bathrooms and safety rails on stairs.  Keep rooms and walkways well-lit. Activity   Follow a regular exercise program to stay fit. This will help you maintain your balance. Ask your health care provider what types of exercise are appropriate for you.  If you need a cane or walker, use it as recommended by your health care provider.  Wear supportive shoes that have nonskid soles. Lifestyle  Do not drink alcohol if your health care provider tells you not to drink.  If you drink alcohol, limit how much you have: ? 0-1 drink a day for women. ? 0-2 drinks a day for men.  Be aware of how much alcohol is in your drink. In the [place] one drink equals one typical bottle of beer (12 oz), one-half glass of wine (5 oz), or one shot of hard liquor (1 oz).  Do not use any products that contain nicotine or tobacco, such as cigarettes and e-cigarettes. If you need help quitting, ask your health care provider. Summary  Having a healthy lifestyle and getting preventive care can help to protect your health and wellness after age 65.  Screening and testing are the best way to find a health problem early and help you avoid having a fall. Early diagnosis and treatment give you the best chance for managing medical conditions that are more common for people who are older than age 65.  Falls are a major cause of broken bones and head injuries in people who are older than age 65. Take precautions to prevent a fall at home.  Work with your health care provider to learn what changes you can make to improve your health and wellness and to prevent falls. This information is not intended  to replace advice given to you by your health care provider. Make sure you discuss any questions you have with your health care provider. Document Revised: 12/09/2018 Document Reviewed: 07/01/2017 [employer] Patient Education  2020 [employer]  

## 2020-09-05 NOTE — Progress Notes (Signed)
Gregg Smith 75 y.o.   Chief Complaint  Patient presents with  . Hypertension    Follow up 6 month - patient not fasting    HISTORY OF PRESENT ILLNESS: This is a 75 y.o. male with history of hypertension here for 53-month follow-up.  Doing well. Has no complaints or medical concerns today. Recent upper endoscopy done last April reviewed with patient.  Patient has history of Barrett's esophagus. Recent echocardiogram done last June reviewed with patient.  Normal. Health maintenance items reviewed with patient.  Up-to-date with vaccines. Sleeping better.  Still on medications.  Sees psychiatrist on a regular basis.  HPI   Prior to Admission medications   Medication Sig Start Date End Date Taking? Authorizing Provider  lansoprazole (PREVACID) 30 MG capsule TAKE 1 CAPSULE BY MOUTH TWICE A DAY BEFORE MEAL 08/09/20  Yes Irene Shipper, MD  lisinopril-hydrochlorothiazide (ZESTORETIC) 10-12.5 MG tablet TAKE 1 TABLET BY MOUTH EVERY DAY 09/04/20  Yes Dorri Ozturk, Ines Bloomer, MD  Melatonin 5 MG TABS Take 5 mg by mouth at bedtime. 04/12/19  Yes [provider]  metoprolol tartrate (LOPRESSOR) 50 MG tablet Take 50 mg (1 tablet) two hours prior to CT 01/20/20  Yes Donato Heinz, MD  Multiple Vitamin (MULTIVITAMIN) tablet Take 1 tablet by mouth daily.   Yes [provider]  QUEtiapine (SEROQUEL) 100 MG tablet Take 1 tablet (100 mg total) by mouth at bedtime. Patient taking differently: Take 100 mg by mouth at bedtime. Takes 25 mg 04/16/19  Yes Derrill Center, NP  rosuvastatin (CRESTOR) 10 MG tablet TAKE 1 TABLET BY MOUTH EVERY DAY 08/26/20  Yes Reda Citron, Ines Bloomer, MD  SYMBICORT 80-4.5 MCG/ACT inhaler TAKE 2 PUFFS BY MOUTH TWICE A DAY 06/09/20  Yes Eesha Schmaltz, Ines Bloomer, MD  tamsulosin (FLOMAX) 0.4 MG CAPS capsule TAKE 1 CAPSULE BY MOUTH DAILY AT NIGHT. 02/23/20  Yes Jalene Demo, Ines Bloomer, MD  traZODone (DESYREL) 50 MG tablet Take 1 tablet (50 mg total) by mouth at bedtime  and may repeat dose one time if needed. 04/16/19  Yes Derrill Center, NP  CLINPRO 5000 1.1 % PSTE Place onto teeth daily. 03/23/20   [provider]  sodium fluoride (FLUORISHIELD) 1.1 % GEL dental gel Place onto teeth. 12/29/19   [provider]    Allergies  Allergen Reactions  . Penicillins Hives and Rash    Has patient had a PCN reaction causing immediate rash, facial/tongue/throat swelling, SOB or lightheadedness with hypotension:unsure Has patient had a PCN reaction causing severe rash involving mucus membranes or skin necrosis:unsure Has patient had a PCN reaction that required hospitalization:No Has patient had a PCN reaction occurring within the last 10 years:No If all of the above answers are "NO", then may proceed with Cephalosporin use. Childhood reaction     Patient Active Problem List   Diagnosis Date Noted  . Generalized anxiety disorder   . Panic disorder   . Insomnia   . MDD (major depressive disorder), severe ([place]) 04/11/2019  . GERD (gastroesophageal reflux disease) 08/14/2017  . 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 OSA (obstructive sleep apnea) 03/22/2014  . Snoring disorder 05/09/2013  . Unspecified essential hypertension 02/11/2013  . Other and unspecified hyperlipidemia 02/11/2013  . Arthritis 02/11/2013    Past Medical History:  Diagnosis Date  . Allergy    seasonal   . Anxiety   . Arthritis   . Barrett's esophagus   . Cataract   . Chicken pox   .  Colon polyps    hyperplastic  . Diverticulosis   . GERD (gastroesophageal reflux disease)   . Hemorrhoids   . Hyperlipidemia   . Hypertension   . Leg cramps   . Measles   . Mumps   . Pneumonia   . Rhinitis   . Sleep apnea    dental device  . Snoring disorder 05/09/2013    Past Surgical History:  Procedure Laterality Date  . APPENDECTOMY  1957  . CATARACT EXTRACTION    . COLONOSCOPY    . JOINT REPLACEMENT    . TONSILLECTOMY    . TONSILLECTOMY AND  ADENOIDECTOMY  1954  . 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   Procedure: LEFT TOTAL KNEE ARTHROPLASTY;  Surgeon: Durene Romans, MD;  Location: [facility];  Service: Orthopedics;  Laterality: Left;  . UPPER GASTROINTESTINAL ENDOSCOPY      Social History   Socioeconomic History  . Marital status: Widowed    Spouse name: [name]  . Number of children: 1  . Years of education: PHD  . Highest education level: Not on file  Occupational History    Comment: Professor,[employer]    Employer: [employer]  Tobacco Use  . Smoking status: Former Smoker    Packs/day: 1.00    Years: 40.00    Pack years: 40.00    Types: Cigarettes    Quit date: 01/19/2007    Years since quitting: 13.6  . Smokeless tobacco: Never Used  Vaping Use  . Vaping Use: Never used  Substance and Sexual Activity  . Alcohol use: Yes    Alcohol/week: 2.0 standard drinks    Types: 2 Cans of beer per week  . Drug use: No  . Sexual activity: Yes    Birth control/protection: None  Other Topics Concern  . Not on file  Social History Narrative   Patient is married Marketing executive) and lives at home with his wife.   Patient is working full-time.   Patient has a Ph.D   Patient has one adult child.   Patient is right-handed.   Patient drinks two cups of tea daily.   Social Determinants of Health   Financial Resource Strain: Not on file  Food Insecurity: Not on file  Transportation Needs: Not on file  Physical Activity: Not on file  Stress: Not on file  Social Connections: Not on file  Intimate Partner Violence: Not on file    Family History  Problem Relation Age of Onset  . Alzheimer's disease Mother   . Cancer Father   . Stomach cancer Father   . Aortic aneurysm Son   . Heart disease Son   . Cancer Maternal Grandmother   . Heart disease Maternal Grandfather   . Stomach cancer Paternal Grandfather   . Colon cancer Neg Hx   . Colon polyps Neg Hx   . Esophageal cancer Neg Hx   . Rectal cancer Neg Hx    . Pancreatic cancer Neg Hx   . Liver cancer Neg Hx      Review of Systems  Constitutional: Negative.  Negative for chills and fever.  HENT: Negative.  Negative for congestion and sore throat.   Respiratory: Negative.  Negative for cough and shortness of breath.   Cardiovascular: Negative.  Negative for chest pain and palpitations.  Gastrointestinal: Negative.  Negative for abdominal pain, blood in stool, diarrhea, nausea and vomiting.  Genitourinary: Negative.  Negative for dysuria and hematuria.  Musculoskeletal: Positive for back pain.  Skin: Negative.  Negative for rash.  Neurological: Negative.  Negative for dizziness and headaches.  All other systems reviewed and are negative.   Today's Vitals   09/05/20 1043  BP: 125/75  Pulse: 72  Resp: 16  Temp: 98.2 F (36.8 C)  TempSrc: Temporal  SpO2: 97%  Weight: 269 lb (122 kg)  Height: 5\' 11"  (1.803 m)   Body mass index is 37.52 kg/m. BP Readings from Last 3 Encounters:  09/05/20 125/75  03/28/20 (!) 140/82  03/06/20 136/88    Physical Exam Vitals reviewed.  Constitutional:      Appearance: Normal appearance.  HENT:     Head: Normocephalic.  Eyes:     Extraocular Movements: Extraocular movements intact.     Pupils: Pupils are equal, round, and reactive to light.  Cardiovascular:     Rate and Rhythm: Normal rate and regular rhythm.     Pulses: Normal pulses.     Heart sounds: Normal heart sounds.  Pulmonary:     Effort: Pulmonary effort is normal.     Breath sounds: Normal breath sounds.  Musculoskeletal:        General: Normal range of motion.     Cervical back: Normal range of motion and neck supple.  Skin:    General: Skin is warm and dry.     Capillary Refill: Capillary refill takes less than 2 seconds.  Neurological:     General: No focal deficit present.     Mental Status: He is alert and oriented to person, place, and time.  Psychiatric:        Mood and Affect: Mood normal.        Behavior:  Behavior normal.      ASSESSMENT & PLAN: Clinically stable.  No medical concerns identified during this visit. Continue present medications.  No changes. Follow-up in 6 months.  Derrie was seen today for hypertension.  Diagnoses and all orders for this visit:  Essential hypertension  Dyslipidemia  History of insomnia  History of Barrett's esophagus  History of depression    Patient Instructions       If you have lab work done today you will be contacted with your lab results within the next 2 weeks.  If you have not heard from [place] then please contact us. The fastest way to get your results is to register for My Chart.   IF you received an x-ray today, you will receive an invoice from [facility]. Please contact [facility] at [phone] with questions or concerns regarding your invoice.   IF you received labwork today, you will receive an invoice from [facility]. Please contact [facility] at [phone] with questions or concerns regarding your invoice.   Our billing staff will not be able to assist you with questions regarding bills from these companies.  You will be contacted with the lab results as soon as they are available. The fastest way to get your results is to activate your My Chart account. Instructions are located on the last page of this paperwork. If you have not heard from [place] regarding the results in 2 weeks, please contact this office.     Health Maintenance After Age 20 After age 85, you are at a higher risk for certain long-term diseases and infections as well as injuries from falls. Falls are a major cause of broken bones and head injuries in people who are older than age 27. Getting regular preventive care can help to keep you healthy and well. Preventive care includes getting regular  testing and making lifestyle changes as recommended by your health care provider. Talk with your health care provider about:  Which screenings and  tests you should have. A screening is a test that checks for a disease when you have no symptoms.  A diet and exercise plan that is right for you. What should I know about screenings and tests to prevent falls? Screening and testing are the best ways to find a health problem early. Early diagnosis and treatment give you the best chance of managing medical conditions that are common after age 11. Certain conditions and lifestyle choices may make you more likely to have a fall. Your health care provider may recommend:  Regular vision checks. Poor vision and conditions such as cataracts can make you more likely to have a fall. If you wear glasses, make sure to get your prescription updated if your vision changes.  Medicine review. Work with your health care provider to regularly review all of the medicines you are taking, including over-the-counter medicines. Ask your health care provider about any side effects that may make you more likely to have a fall. Tell your health care provider if any medicines that you take make you feel dizzy or sleepy.  Osteoporosis screening. Osteoporosis is a condition that causes the bones to get weaker. This can make the bones weak and cause them to break more easily.  Blood pressure screening. Blood pressure changes and medicines to control blood pressure can make you feel dizzy.  Strength and balance checks. Your health care provider may recommend certain tests to check your strength and balance while standing, walking, or changing positions.  Foot health exam. Foot pain and numbness, as well as not wearing proper footwear, can make you more likely to have a fall.  Depression screening. You may be more likely to have a fall if you have a fear of falling, feel emotionally low, or feel unable to do activities that you used to do.  Alcohol use screening. Using too much alcohol can affect your balance and may make you more likely to have a fall. What actions can I  take to lower my risk of falls? General instructions  Talk with your health care provider about your risks for falling. Tell your health care provider if: ? You fall. Be sure to tell your health care provider about all falls, even ones that seem minor. ? You feel dizzy, sleepy, or off-balance.  Take over-the-counter and prescription medicines only as told by your health care provider. These include any supplements.  Eat a healthy diet and maintain a healthy weight. A healthy diet includes low-fat dairy products, low-fat (lean) meats, and fiber from whole grains, beans, and lots of fruits and vegetables. Home safety  Remove any tripping hazards, such as rugs, cords, and clutter.  Install safety equipment such as grab bars in bathrooms and safety rails on stairs.  Keep rooms and walkways well-lit. Activity   Follow a regular exercise program to stay fit. This will help you maintain your balance. Ask your health care provider what types of exercise are appropriate for you.  If you need a cane or walker, use it as recommended by your health care provider.  Wear supportive shoes that have nonskid soles. Lifestyle  Do not drink alcohol if your health care provider tells you not to drink.  If you drink alcohol, limit how much you have: ? 0-1 drink a day for women. ? 0-2 drinks a day for men.  Be aware  of how much alcohol is in your drink. In the [place] one drink equals one typical bottle of beer (12 oz), one-half glass of wine (5 oz), or one shot of hard liquor (1 oz).  Do not use any products that contain nicotine or tobacco, such as cigarettes and e-cigarettes. If you need help quitting, ask your health care provider. Summary  Having a healthy lifestyle and getting preventive care can help to protect your health and wellness after age 62.  Screening and testing are the best way to find a health problem early and help you avoid having a fall. Early diagnosis and treatment give you  the best chance for managing medical conditions that are more common for people who are older than age 42.  Falls are a major cause of broken bones and head injuries in people who are older than age 76. Take precautions to prevent a fall at home.  Work with your health care provider to learn what changes you can make to improve your health and wellness and to prevent falls. This information is not intended to replace advice given to you by your health care provider. Make sure you discuss any questions you have with your health care provider. Document Revised: 12/09/2018 Document Reviewed: 07/01/2017 Elsevier Patient Education  2020 [employer]     Agustina Caroli, MD [facility]

## 2020-09-07 ENCOUNTER — Other Ambulatory Visit: Payer: Self-pay | Admitting: Emergency Medicine

## 2020-09-14 ENCOUNTER — Other Ambulatory Visit: Payer: Self-pay | Admitting: Emergency Medicine

## 2020-09-14 DIAGNOSIS — I10 Essential (primary) hypertension: Secondary | ICD-10-CM

## 2020-09-14 DIAGNOSIS — J42 Unspecified chronic bronchitis: Secondary | ICD-10-CM

## 2020-10-24 ENCOUNTER — Ambulatory Visit: Payer: Medicare Other | Admitting: Emergency Medicine

## 2020-10-24 ENCOUNTER — Other Ambulatory Visit: Payer: Self-pay

## 2020-10-24 ENCOUNTER — Encounter: Payer: Self-pay | Admitting: Emergency Medicine

## 2020-10-24 VITALS — BP 130/79 | HR 79 | Temp 97.5°F | Resp 16 | Ht 71.0 in | Wt 268.0 lb

## 2020-10-24 DIAGNOSIS — M25561 Pain in right knee: Secondary | ICD-10-CM

## 2020-10-24 DIAGNOSIS — I10 Essential (primary) hypertension: Secondary | ICD-10-CM | POA: Diagnosis not present

## 2020-10-24 DIAGNOSIS — E785 Hyperlipidemia, unspecified: Secondary | ICD-10-CM

## 2020-10-24 MED ORDER — MELOXICAM 7.5 MG PO TABS: 7.5000 mg | ORAL_TABLET | Freq: Every day | ORAL | 1 refills | Status: AC

## 2020-10-24 NOTE — Progress Notes (Signed)
Gregg Smith 75 y.o.   Chief Complaint  Patient presents with  . Knee Pain    Right - per patient started 10/16/2020 and painful to walk on    HISTORY OF PRESENT ILLNESS: This is a 75 y.o. male complaining of pain to his right knee that started about 1 week ago. History of left knee replacement in the past about 3 years ago. Complaining of sharp pain with mild swelling and painful ambulation. No other complaints or medical concerns today.  HPI   Prior to Admission medications   Medication Sig Start Date End Date Taking? Authorizing Provider  lansoprazole (PREVACID) 30 MG capsule TAKE 1 CAPSULE BY MOUTH TWICE A DAY BEFORE MEAL 08/09/20  Yes Irene Shipper, MD  lisinopril-hydrochlorothiazide (ZESTORETIC) 10-12.5 MG tablet TAKE 1 TABLET BY MOUTH EVERY DAY 09/04/20  Yes Breella Vanostrand, Ines Bloomer, MD  Melatonin 5 MG TABS Take 5 mg by mouth at bedtime. 04/12/19  Yes [provider]  Multiple Vitamin (MULTIVITAMIN) tablet Take 1 tablet by mouth daily.   Yes [provider]  QUEtiapine (SEROQUEL) 100 MG tablet Take 1 tablet (100 mg total) by mouth at bedtime. Patient taking differently: Take 100 mg by mouth at bedtime. Takes 25 mg 04/16/19  Yes Derrill Center, NP  rosuvastatin (CRESTOR) 10 MG tablet TAKE 1 TABLET BY MOUTH EVERY DAY 08/26/20  Yes Gordy Goar, Ines Bloomer, MD  SYMBICORT 80-4.5 MCG/ACT inhaler TAKE 2 PUFFS BY MOUTH TWICE A DAY 09/14/20  Yes Jagjit Riner, Ines Bloomer, MD  tamsulosin (FLOMAX) 0.4 MG CAPS capsule TAKE 1 CAPSULE BY MOUTH DAILY AT NIGHT. 09/07/20  Yes Jessyka Austria, Ines Bloomer, MD  traZODone (DESYREL) 50 MG tablet Take 1 tablet (50 mg total) by mouth at bedtime and may repeat dose one time if needed. 04/16/19  Yes Derrill Center, NP  CLINPRO 5000 1.1 % PSTE Place onto teeth daily. 03/23/20   [provider]  metoprolol tartrate (LOPRESSOR) 50 MG tablet Take 50 mg (1 tablet) two hours prior to CT 01/20/20   Donato Heinz, MD  sodium fluoride  (FLUORISHIELD) 1.1 % GEL dental gel Place onto teeth. 12/29/19   [provider]    Allergies  Allergen Reactions  . Penicillins Hives and Rash    Has patient had a PCN reaction causing immediate rash, facial/tongue/throat swelling, SOB or lightheadedness with hypotension:unsure Has patient had a PCN reaction causing severe rash involving mucus membranes or skin necrosis:unsure Has patient had a PCN reaction that required hospitalization:No Has patient had a PCN reaction occurring within the last 10 years:No If all of the above answers are "NO", then may proceed with Cephalosporin use. Childhood reaction     Patient Active Problem List   Diagnosis Date Noted  . Generalized anxiety disorder   . Panic disorder   . Insomnia   . GERD (gastroesophageal reflux disease) 08/14/2017  . 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 OSA (obstructive sleep apnea) 03/22/2014  . Snoring disorder 05/09/2013  . Unspecified essential hypertension 02/11/2013  . Other and unspecified hyperlipidemia 02/11/2013  . Arthritis 02/11/2013    Past Medical History:  Diagnosis Date  . Allergy    seasonal   . Anxiety   . Arthritis   . Barrett's esophagus   . Cataract   . Chicken pox   . Colon polyps    hyperplastic  . Diverticulosis   . GERD (gastroesophageal reflux disease)   . Hemorrhoids   . Hyperlipidemia   . Hypertension   .  Leg cramps   . Measles   . Mumps   . Pneumonia   . Rhinitis   . Sleep apnea    dental device  . Snoring disorder 05/09/2013    Past Surgical History:  Procedure Laterality Date  . APPENDECTOMY  1957  . CATARACT EXTRACTION    . COLONOSCOPY    . JOINT REPLACEMENT    . TONSILLECTOMY    . [place]  . 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 UPPER GASTROINTESTINAL ENDOSCOPY       Social History   Socioeconomic History  . Marital status: Widowed    Spouse name: Gregg Smith  . Number of children: 1  . Years of education: PHD  . Highest education level: Not on file  Occupational History    Comment: Professor,[employer]    Employer: [employer]  Tobacco Use  . Smoking status: Former Smoker    Packs/day: 1.00    Years: 40.00    Pack years: 40.00    Types: Cigarettes    Quit date: 01/19/2007    Years since quitting: 13.7  . Smokeless tobacco: Never Used  Vaping Use  . Vaping Use: Never used  Substance and Sexual Activity  . Alcohol use: Yes    Alcohol/week: 2.0 standard drinks    Types: 2 Cans of beer per week  . Drug use: No  . Sexual activity: Yes    Birth control/protection: None  Other Topics Concern  . 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   Patient is working full-time.   Patient has a Ph.D   Patient has one adult child.   Patient is right-handed.   Patient drinks two cups of tea daily.   Social Determinants of Health   Financial Resource Strain: Not on file  Food Insecurity: Not on file  Transportation Needs: Not on file  Physical Activity: Not on file  Stress: Not on file  Social Connections: Not on file  Intimate Partner Violence: Not on file    Family History  Problem Relation Age of Onset  . Alzheimer's disease Mother   . Cancer Father   . Stomach cancer Father   . Aortic aneurysm Son   . Heart disease Son   . Cancer Maternal Grandmother   . Heart disease Maternal Grandfather   . Stomach cancer Paternal Grandfather   . Colon cancer Neg Hx   . Colon polyps Neg Hx   . Esophageal cancer Neg Hx   . Rectal cancer Neg Hx   . Pancreatic cancer Neg Hx   . Liver cancer Neg Hx      Review of Systems  Constitutional: Negative.  Negative for fever.  HENT: Negative.  Negative for congestion and sore throat.   Respiratory: Negative.  Negative for cough  and shortness of breath.   Cardiovascular: Negative.  Negative for chest pain and palpitations.  Gastrointestinal: Negative.  Negative for abdominal pain, diarrhea, nausea and vomiting.  Genitourinary: Negative.  Negative for dysuria and hematuria.  Skin: Negative.  Negative for rash.  Neurological: Negative.  Negative for dizziness and headaches.  All other systems reviewed and are negative.  Today's Vitals   10/24/20 0932  BP: 130/79  Pulse: 79  Resp: 16  Temp: (!) 97.5 F (  36.4 C)  TempSrc: Temporal  SpO2: 94%  Weight: 268 lb (121.6 kg)  Height: 5\' 11"  (1.803 m)   Body mass index is 37.38 kg/m.   Physical Exam Vitals reviewed.  Constitutional:      Appearance: Normal appearance.  HENT:     Head: Normocephalic.  Eyes:     Extraocular Movements: Extraocular movements intact.     Pupils: Pupils are equal, round, and reactive to light.  Cardiovascular:     Rate and Rhythm: Normal rate.  Pulmonary:     Effort: Pulmonary effort is normal.  Musculoskeletal:     Cervical back: Normal range of motion and neck supple.     Comments: Right knee: Mild swelling.  No erythema.  No significant tenderness.  Limited range of motion.  Some crepitation.  Skin:    General: Skin is warm and dry.     Capillary Refill: Capillary refill takes less than 2 seconds.  Neurological:     General: No focal deficit present.     Mental Status: He is alert and oriented to person, place, and time.  Psychiatric:        Mood and Affect: Mood normal.        Behavior: Behavior normal.      ASSESSMENT & PLAN: Gregg Smith was seen today for knee pain.  Diagnoses and all orders for this visit:  Acute pain of right knee -     Ambulatory referral to Orthopedic Surgery -     meloxicam (MOBIC) 7.5 MG tablet; Take 1 tablet (7.5 mg total) by mouth daily for 7 days.  Essential hypertension  Dyslipidemia    Patient Instructions   Acute Knee Pain, Adult Many things can cause knee pain. Sometimes,  knee pain is sudden (acute) and may be caused by damage, swelling, or irritation of the muscles and tissues that support your knee. The pain often goes away on its own with time and rest. If the pain does not go away, tests may be done to find out what is causing the pain. Follow these instructions at home: If you have a knee sleeve or brace:  Wear the knee sleeve or brace as told by your doctor. Take it off only as told by your doctor.  Loosen it if your toes: ? Tingle. ? Become numb. ? Turn cold and blue.  Keep it clean.  If the knee sleeve or brace is not waterproof: ? Do not let it get wet. ? Cover it with a watertight covering when you take a bath or shower.   Activity  Rest your knee.  Do not do things that cause pain or make pain worse.  Avoid activities where both feet leave the ground at the same time (high-impact activities). Examples are running, jumping rope, and doing jumping jacks.  Work with a physical therapist to make a safe exercise program, as told by your doctor. Managing pain, stiffness, and swelling  If told, put ice on the knee. To do this: ? If you have a removable knee sleeve or brace, take it off as told by your doctor. ? Put ice in a plastic bag. ? Place a towel between your skin and the bag. ? Leave the ice on for 20 minutes, 2-3 times a day. ? Take off the ice if your skin turns bright red. This is very important. If you cannot feel pain, heat, or cold, you have a greater risk of damage to the area.  If told, use an elastic bandage to  put pressure (compression) on your injured knee.  Raise your knee above the level of your heart while you are sitting or lying down.  Sleep with a pillow under your knee.   General instructions  Take over-the-counter and prescription medicines only as told by your doctor.  Do not smoke or use any products that contain nicotine or tobacco. If you need help quitting, ask your doctor.  If you are overweight, work  with your doctor and a food expert (dietitian) to set goals to lose weight. Being overweight can make your knee hurt more.  Watch for any changes in your symptoms.  Keep all follow-up visits. Contact a doctor if:  The knee pain does not stop.  The knee pain changes or gets worse.  You have a fever along with knee pain.  Your knee is red or feels warm when you touch it.  Your knee gives out or locks up. Get help right away if:  Your knee swells, and the swelling gets worse.  You cannot move your knee.  You have very bad knee pain that does not get better with pain medicine. Summary  Many things can cause knee pain. The pain often goes away on its own with time and rest.  Your doctor may do tests to find out the cause of the pain.  Watch for any changes in your symptoms. Relieve your pain with rest, medicines, light activity, and use of ice.  Get help right away if you cannot move your knee or your knee pain is very bad. This information is not intended to replace advice given to you by your health care provider. Make sure you discuss any questions you have with your health care provider. Document Revised: 02/01/2020 Document Reviewed: 02/01/2020 Elsevier Patient Education  2021 Elsevier Inc.      Agustina Caroli, MD [facility]

## 2020-10-24 NOTE — Patient Instructions (Signed)
Acute Knee Pain, Adult Many things can cause knee pain. Sometimes, knee pain is sudden (acute) and may be caused by damage, swelling, or irritation of the muscles and tissues that support your knee. The pain often goes away on its own with time and rest. If the pain does not go away, tests may be done to find out what is causing the pain. Follow these instructions at home: If you have a knee sleeve or brace:  Wear the knee sleeve or brace as told by your doctor. Take it off only as told by your doctor.  Loosen it if your toes: ? Tingle. ? Become numb. ? Turn cold and blue.  Keep it clean.  If the knee sleeve or brace is not waterproof: ? Do not let it get wet. ? Cover it with a watertight covering when you take a bath or shower.   Activity  Rest your knee.  Do not do things that cause pain or make pain worse.  Avoid activities where both feet leave the ground at the same time (high-impact activities). Examples are running, jumping rope, and doing jumping jacks.  Work with a physical therapist to make a safe exercise program, as told by your doctor. Managing pain, stiffness, and swelling  If told, put ice on the knee. To do this: ? If you have a removable knee sleeve or brace, take it off as told by your doctor. ? Put ice in a plastic bag. ? Place a towel between your skin and the bag. ? Leave the ice on for 20 minutes, 2-3 times a day. ? Take off the ice if your skin turns bright red. This is very important. If you cannot feel pain, heat, or cold, you have a greater risk of damage to the area.  If told, use an elastic bandage to put pressure (compression) on your injured knee.  Raise your knee above the level of your heart while you are sitting or lying down.  Sleep with a pillow under your knee.   General instructions  Take over-the-counter and prescription medicines only as told by your doctor.  Do not smoke or use any products that contain nicotine or tobacco. If you  need help quitting, ask your doctor.  If you are overweight, work with your doctor and a food expert (dietitian) to set goals to lose weight. Being overweight can make your knee hurt more.  Watch for any changes in your symptoms.  Keep all follow-up visits. Contact a doctor if:  The knee pain does not stop.  The knee pain changes or gets worse.  You have a fever along with knee pain.  Your knee is red or feels warm when you touch it.  Your knee gives out or locks up. Get help right away if:  Your knee swells, and the swelling gets worse.  You cannot move your knee.  You have very bad knee pain that does not get better with pain medicine. Summary  Many things can cause knee pain. The pain often goes away on its own with time and rest.  Your doctor may do tests to find out the cause of the pain.  Watch for any changes in your symptoms. Relieve your pain with rest, medicines, light activity, and use of ice.  Get help right away if you cannot move your knee or your knee pain is very bad. This information is not intended to replace advice given to you by your health care provider. Make sure you discuss   any questions you have with your health care provider. Document Revised: 02/01/2020 Document Reviewed: 02/01/2020 Elsevier Patient Education  2021 Elsevier Inc.  

## 2020-11-06 ENCOUNTER — Ambulatory Visit: Payer: BC Managed Care – PPO | Admitting: Orthopedic Surgery

## 2020-11-06 ENCOUNTER — Ambulatory Visit: Payer: Self-pay

## 2020-11-06 DIAGNOSIS — M25561 Pain in right knee: Secondary | ICD-10-CM | POA: Diagnosis not present

## 2020-11-19 ENCOUNTER — Other Ambulatory Visit: Payer: Self-pay | Admitting: Emergency Medicine

## 2020-11-19 ENCOUNTER — Other Ambulatory Visit: Payer: Self-pay | Admitting: Internal Medicine

## 2020-11-19 DIAGNOSIS — K2270 Barrett's esophagus without dysplasia: Secondary | ICD-10-CM

## 2020-11-19 DIAGNOSIS — K219 Gastro-esophageal reflux disease without esophagitis: Secondary | ICD-10-CM

## 2020-11-19 DIAGNOSIS — I10 Essential (primary) hypertension: Secondary | ICD-10-CM

## 2020-11-20 ENCOUNTER — Encounter: Payer: Self-pay | Admitting: Orthopedic Surgery

## 2020-11-20 DIAGNOSIS — M25561 Pain in right knee: Secondary | ICD-10-CM

## 2020-11-20 MED ORDER — LIDOCAINE HCL (PF) 1 % IJ SOLN
5.0000 mL | INTRAMUSCULAR | Status: AC | PRN
  Administered 2020-11-20: 5 mL

## 2020-11-20 MED ORDER — METHYLPREDNISOLONE ACETATE 40 MG/ML IJ SUSP
40.0000 mg | INTRAMUSCULAR | Status: AC | PRN
  Administered 2020-11-20: 40 mg via INTRA_ARTICULAR

## 2020-11-20 NOTE — Progress Notes (Signed)
Office Visit Note   Patient: Gregg Smith           Date of Birth: 05/21/1946           MRN: [id number] Visit Date: 11/06/2020              Requested by: Horald Pollen, MD [place],  [place] [address] PCP: Horald Pollen, MD  Chief Complaint  Patient presents with  . Right Knee - Pain      HPI: Patient is a 75 year old gentleman who states that he had a twisting injury to his right knee 2 weeks ago getting out of the car he had acute onset of pain posteriorly in the knee he states that it is feeling better now but has pain with increased activities.  Assessment & Plan: Visit Diagnoses:  1. Acute pain of right knee     Plan: The right knee was injected he tolerated this well, no restrictions.  Patient states that he is ready to proceed with a total knee arthroplasty.  Patient states he would like to proceed with surgery around Christmas we will call to set this up at his convenience.  Risk and benefits were discussed overnight observation was discussed.  Patient states he understands and wished to proceed.  Follow-Up Instructions: No follow-ups on file.   Ortho Exam  Patient is alert, oriented, no adenopathy, well-dressed, normal affect, normal respiratory effort. Examination there is no effusion no ecchymosis no abrasions.  There is crepitation with range of motion of his knee collaterals and cruciates are stable no acute ligamentous injury.  There is tenderness to palpation the patellofemoral joint as well as medial and lateral joint lines.  Imaging: No results found. No images are attached to the encounter.  Labs: Lab Results  Component Value Date   HGBA1C 5.3 04/11/2019   HGBA1C 5.4 08/03/2018   HGBA1C 5.5 08/14/2017   GRAMSTAIN Rare 08/14/2012   GRAMSTAIN WBC present-both PMN and Mononuclear 08/14/2012   GRAMSTAIN No Squamous Epithelial Cells Seen 08/14/2012   GRAMSTAIN Few Gram Negative Rods 08/14/2012   GRAMSTAIN Few Gram  Positive Cocci In Pairs In Chains 08/14/2012   LABORGA Multiple Organisms Present,None Predominant 08/14/2012     Lab Results  Component Value Date   ALBUMIN 4.2 03/06/2020   ALBUMIN 3.9 01/11/2020   ALBUMIN 4.2 09/06/2019    No results found for: MG No results found for: VD25OH  No results found for: PREALBUMIN CBC EXTENDED Latest Ref Rng & Units 01/11/2020 09/06/2019 04/11/2019  WBC 3.4 - 10.8 x10E3/uL 6.3 6.3 8.7  RBC 4.14 - 5.80 x10E6/uL 4.94 5.37 5.30  HGB 13.0 - 17.7 g/dL 14.3 15.6 15.1  HCT 37.5 - 51.0 % 42.4 44.9 45.9  PLT 150 - 450 x10E3/uL 168 197 221  NEUTROABS 1.4 - 7.0 x10E3/uL 4.3 4.3 -  LYMPHSABS 0.7 - 3.1 x10E3/uL 1.1 1.1 -     There is no height or weight on file to calculate BMI.  Orders:  Orders Placed This Encounter  Procedures  . XR Knee 1-2 Views Right   No orders of the defined types were placed in this encounter.    Procedures: Large Joint Inj: R knee on 11/20/2020 9:20 AM Indications: pain and diagnostic evaluation Details: 22 G 1.5 in needle, anteromedial approach  Arthrogram: No  Medications: 5 mL lidocaine (PF) 1 %; 40 mg methylPREDNISolone acetate 40 MG/ML Outcome: tolerated well, no immediate complications Procedure, treatment alternatives, risks and benefits explained, specific risks discussed. Consent  was given by the patient. Immediately prior to procedure a time out was called to verify the correct patient, procedure, equipment, support staff and site/side marked as required. Patient was prepped and draped in the usual sterile fashion.      Clinical Data: No additional findings.  ROS:  All other systems negative, except as noted in the HPI. Review of Systems  Objective: Vital Signs: There were no vitals taken for this visit.  Specialty Comments:  No specialty comments available.  PMFS History: Patient Active Problem List   Diagnosis Date Noted  . Generalized anxiety disorder   . Panic disorder   . Insomnia   . GERD  (gastroesophageal reflux disease) 08/14/2017  . 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 OSA (obstructive sleep apnea) 03/22/2014  . Snoring disorder 05/09/2013  . Unspecified essential hypertension 02/11/2013  . Other and unspecified hyperlipidemia 02/11/2013  . Arthritis 02/11/2013   Past Medical History:  Diagnosis Date  . Allergy    seasonal   . Anxiety   . Arthritis   . Barrett's esophagus   . Cataract   . Chicken pox   . Colon polyps    hyperplastic  . Diverticulosis   . GERD (gastroesophageal reflux disease)   . Hemorrhoids   . Hyperlipidemia   . Hypertension   . Leg cramps   . Measles   . Mumps   . Pneumonia   . Rhinitis   . Sleep apnea    dental device  . Snoring disorder 05/09/2013    Family History  Problem Relation Age of Onset  . Alzheimer's disease Mother   . Cancer Father   . Stomach cancer Father   . Aortic aneurysm Son   . Heart disease Son   . Cancer Maternal Grandmother   . Heart disease Maternal Grandfather   . Stomach cancer Paternal Grandfather   . Colon cancer Neg Hx   . Colon polyps Neg Hx   . Esophageal cancer Neg Hx   . Rectal cancer Neg Hx   . Pancreatic cancer Neg Hx   . Liver cancer Neg Hx     Past Surgical History:  Procedure Laterality Date  . APPENDECTOMY  1957  . CATARACT EXTRACTION    . COLONOSCOPY    . JOINT REPLACEMENT    . TONSILLECTOMY    . [place]  . 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 Smoking status: Former Smoker    Packs/day: 1.00    Years: 40.00    Pack years: 40.00    Types: Cigarettes    Quit date: 01/19/2007    Years since quitting: 13.8  . Smokeless  tobacco: Never Used  Vaping Use  . Vaping Use: Never used  Substance and Sexual Activity  . Alcohol use: Yes    Alcohol/week: 2.0 standard drinks    Types: 2 Cans of beer per week  . Drug use: No  . Sexual activity: Yes    Birth control/protection: None

## 2021-01-03 ENCOUNTER — Encounter: Payer: Self-pay | Admitting: Internal Medicine

## 2021-01-31 ENCOUNTER — Encounter: Payer: Self-pay | Admitting: Podiatry

## 2021-01-31 ENCOUNTER — Other Ambulatory Visit: Payer: Self-pay

## 2021-01-31 ENCOUNTER — Ambulatory Visit (INDEPENDENT_AMBULATORY_CARE_PROVIDER_SITE_OTHER): Payer: BC Managed Care – PPO | Admitting: Podiatry

## 2021-01-31 ENCOUNTER — Telehealth: Payer: Self-pay

## 2021-01-31 ENCOUNTER — Other Ambulatory Visit: Payer: Self-pay | Admitting: Podiatry

## 2021-01-31 DIAGNOSIS — M1711 Unilateral primary osteoarthritis, right knee: Secondary | ICD-10-CM | POA: Insufficient documentation

## 2021-01-31 DIAGNOSIS — B351 Tinea unguium: Secondary | ICD-10-CM

## 2021-01-31 DIAGNOSIS — M1710 Unilateral primary osteoarthritis, unspecified knee: Secondary | ICD-10-CM | POA: Insufficient documentation

## 2021-01-31 DIAGNOSIS — M179 Osteoarthritis of knee, unspecified: Secondary | ICD-10-CM | POA: Insufficient documentation

## 2021-01-31 MED ORDER — TERBINAFINE HCL 250 MG PO TABS
250.0000 mg | ORAL_TABLET | Freq: Every day | ORAL | 0 refills | Status: DC
Start: 2021-01-31 — End: 2021-04-28

## 2021-01-31 NOTE — Telephone Encounter (Signed)
Toenail specimen mailed to [facility] for fungal culture

## 2021-02-01 LAB — HEPATIC FUNCTION PANEL
ALT: 21 IU/L (ref 0–44)
AST: 26 IU/L (ref 0–40)
Albumin: 4.2 g/dL (ref 3.7–4.7)
Alkaline Phosphatase: 65 IU/L (ref 44–121)
Bilirubin Total: 0.5 mg/dL (ref 0.0–1.2)
Bilirubin, Direct: 0.14 mg/dL (ref 0.00–0.40)
Total Protein: 6.8 g/dL (ref 6.0–8.5)

## 2021-02-04 ENCOUNTER — Encounter: Payer: Self-pay | Admitting: Podiatry

## 2021-02-04 NOTE — Progress Notes (Signed)
  Subjective:  Patient ID: MCADOO MUZQUIZ, male    DOB: 1945-12-16,  MRN: [id number]  Chief Complaint  Patient presents with  . Nail Problem    (np) fungus infection, right great toe    75 y.o. male presents with the above complaint. History confirmed with patient.   Objective:  Physical Exam: warm, good capillary refill, no trophic changes or ulcerative lesions, normal DP and PT pulses and normal sensory exam.  Right Foot: Right hallux has thickening, dystrophy and subungual debris with yellow discoloration    Assessment:   1. Onychomycosis      Plan:  Patient was evaluated and treated and all questions answered.  Discussed treatment options of onychomycosis in detail including oral topical and laser treatment.  Took a culture of the nail today.  We decided to proceed with oral therapy.  I ordered liver function testing to evaluate his current LFTs.  Discussed risks and benefits of treatment of terbinafine.  Follow-up in 4 months  Return in about 4 months (around 06/02/2021).

## 2021-02-18 ENCOUNTER — Other Ambulatory Visit: Payer: Self-pay | Admitting: Emergency Medicine

## 2021-02-18 DIAGNOSIS — E785 Hyperlipidemia, unspecified: Secondary | ICD-10-CM

## 2021-03-04 ENCOUNTER — Other Ambulatory Visit: Payer: Self-pay | Admitting: Emergency Medicine

## 2021-03-05 ENCOUNTER — Ambulatory Visit (INDEPENDENT_AMBULATORY_CARE_PROVIDER_SITE_OTHER): Payer: Medicare Other | Admitting: Emergency Medicine

## 2021-03-05 ENCOUNTER — Encounter: Payer: Self-pay | Admitting: Emergency Medicine

## 2021-03-05 ENCOUNTER — Other Ambulatory Visit: Payer: Self-pay

## 2021-03-05 VITALS — BP 138/78 | HR 72 | Temp 98.3°F | Ht 71.0 in | Wt 269.0 lb

## 2021-03-05 DIAGNOSIS — I10 Essential (primary) hypertension: Secondary | ICD-10-CM | POA: Diagnosis not present

## 2021-03-05 DIAGNOSIS — R399 Unspecified symptoms and signs involving the genitourinary system: Secondary | ICD-10-CM

## 2021-03-05 DIAGNOSIS — M5442 Lumbago with sciatica, left side: Secondary | ICD-10-CM

## 2021-03-05 DIAGNOSIS — M48061 Spinal stenosis, lumbar region without neurogenic claudication: Secondary | ICD-10-CM

## 2021-03-05 DIAGNOSIS — Z23 Encounter for immunization: Secondary | ICD-10-CM

## 2021-03-05 DIAGNOSIS — G8929 Other chronic pain: Secondary | ICD-10-CM | POA: Diagnosis not present

## 2021-03-05 MED ORDER — LISINOPRIL 40 MG PO TABS: 40.0000 mg | ORAL_TABLET | Freq: Every day | ORAL | 3 refills | Status: DC

## 2021-03-05 NOTE — Progress Notes (Signed)
Gregg Smith 75 y.o.   Chief Complaint  Patient presents with   Hypertension    Pt here to follow up    HISTORY OF PRESENT ILLNESS: This is a 75 y.o. male with history of hypertension here for follow-up and also the following complaints: 1.  Hypertension: On Zestoretic 10-12.5 mg daily 2.  Requesting shingles vaccine.  Has a question about Tdap 3.  Lower urinary tract symptoms with frequency and urgency and occasional accidents Presently on Flomax 0.4 mg.  Needs urology referral 4.  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  Needs orthopedic referral No other complaints or medical concerns today.  Hypertension Pertinent negatives include no chest pain, headaches, palpitations or shortness of breath.    Prior to Admission medications   Medication Sig Start Date End Date Taking? Authorizing Provider  chlorhexidine (PERIDEX) 0.12 % solution SMARTSIG:By Mouth 12/12/20  Yes [provider]  clindamycin (CLEOCIN) 150 MG capsule Take 150 mg by mouth 3 (three) times daily. 12/12/20  Yes [provider]  CLINPRO 5000 1.1 % PSTE Place onto teeth daily. 03/23/20  Yes [provider]  lansoprazole (PREVACID) 30 MG capsule Take 1 capsule (30 mg total) by mouth in the morning and at bedtime. Office visit for further refills 11/20/20  Yes Irene Shipper, MD  lisinopril-hydrochlorothiazide (ZESTORETIC) 10-12.5 MG tablet TAKE 1 TABLET BY MOUTH EVERY DAY 11/19/20  Yes Tamu Golz, Ines Bloomer, MD  Melatonin 5 MG TABS Take 5 mg by mouth at bedtime. 04/12/19  Yes [provider]  Multiple Vitamin (MULTIVITAMIN) tablet Take 1 tablet by mouth daily.   Yes [provider]  QUEtiapine (SEROQUEL) 100 MG tablet Take 1 tablet (100 mg total) by mouth at bedtime. Patient taking differently: Take 100 mg by mouth at bedtime. Takes 25 mg 04/16/19  Yes Derrill Center, NP  rosuvastatin (CRESTOR) 10 MG tablet TAKE 1 TABLET BY MOUTH EVERY DAY 02/18/21  Yes Aries Townley,  Ines Bloomer, MD  SYMBICORT 80-4.5 MCG/ACT inhaler TAKE 2 PUFFS BY MOUTH TWICE A DAY 09/14/20  Yes Levander Katzenstein, Ines Bloomer, MD  tamsulosin (FLOMAX) 0.4 MG CAPS capsule TAKE 1 CAPSULE BY MOUTH DAILY AT NIGHT. 03/05/21  Yes Kaedence Connelly, Ines Bloomer, MD  terbinafine (LAMISIL) 250 MG tablet Take 1 tablet (250 mg total) by mouth daily. 01/31/21 05/01/21 Yes McDonald, Stephan Minister, DPM  traZODone (DESYREL) 50 MG tablet Take 1 tablet (50 mg total) by mouth at bedtime and may repeat dose one time if needed. 04/16/19  Yes Derrill Center, NP    Allergies  Allergen Reactions   Penicillins Hives and Rash    Has patient had a PCN reaction causing immediate rash, facial/tongue/throat swelling, SOB or lightheadedness with hypotension:unsure Has patient had a PCN reaction causing severe rash involving mucus membranes or skin necrosis:unsure Has patient had a PCN reaction that required hospitalization:No Has patient had a PCN reaction occurring within the last 10 years:No If all of the above answers are "NO", then may proceed with Cephalosporin use. Childhood reaction     Patient Active Problem List   Diagnosis Date Noted   Osteoarthritis of knee 01/31/2021   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   Scoliosis deformity of spine 04/22/2019   Generalized anxiety disorder    Panic disorder    Insomnia    Spinal stenosis of lumbar region 11/30/2018   Dental plaque 11/02/2018   Lumbar radiculopathy 10/06/2018   Sciatica 09/01/2018   GERD (gastroesophageal reflux disease) 08/14/2017   S/P left TKA  08/11/2016   S/P knee replacement 08/11/2016   OSA (obstructive sleep apnea) 03/22/2014   Snoring disorder 05/09/2013   Unspecified essential hypertension 02/11/2013   Other and unspecified hyperlipidemia 02/11/2013   Arthritis 02/11/2013    Past Medical History:  Diagnosis Date   Allergy    seasonal    Anxiety    Arthritis    Barrett's esophagus    Cataract    Chicken pox    Colon polyps     hyperplastic   Diverticulosis    GERD (gastroesophageal reflux disease)    Hemorrhoids    Hyperlipidemia    Hypertension    Leg cramps    Measles    Mumps    Pneumonia    Rhinitis    Sleep apnea    dental device   Snoring disorder 05/09/2013    Past Surgical History:  Procedure Laterality Date   APPENDECTOMY  1957   CATARACT EXTRACTION     COLONOSCOPY     JOINT REPLACEMENT     TONSILLECTOMY     TONSILLECTOMY AND ADENOIDECTOMY  1954   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   Procedure: LEFT TOTAL KNEE ARTHROPLASTY;  Surgeon: Paralee Cancel, MD;  Location: [facility];  Service: Orthopedics;  Laterality: Left;   UPPER GASTROINTESTINAL ENDOSCOPY      Social History   Socioeconomic History   Marital status: Widowed    Spouse name: [name]   Number of children: 1   Years of education: PHD   Highest education level: Not on file  Occupational History    Comment: Professor,[employer] Education administrator: [employer]  Tobacco Use   Smoking status: Former    Packs/day: 1.00    Years: 40.00    Pack years: 40.00    Types: Cigarettes    Quit date: 01/19/2007    Years since quitting: 14.1   Smokeless tobacco: Never  Vaping Use   Vaping Use: Never used  Substance and Sexual Activity   Alcohol use: Yes    Alcohol/week: 2.0 standard drinks    Types: 2 Cans of beer per week   Drug use: No   Sexual activity: Yes    Birth control/protection: None  Other Topics Concern   Not on file  Social History Narrative   Patient is married ([name]) and lives at home with his wife.   Patient is working full-time.   Patient has a Ph.D   Patient has one adult child.   Patient is right-handed.   Patient drinks two cups of tea daily.   Social Determinants of Health   Financial Resource Strain: Not on file  Food Insecurity: Not on file  Transportation Needs: Not on file  Physical Activity: Not on file  Stress: Not on file  Social Connections: Not on file  Intimate Partner  Violence: Not on file    Family History  Problem Relation Age of Onset   Alzheimer's disease Mother    Cancer Father    Stomach cancer Father    Aortic aneurysm Son    Heart disease Son    Cancer Maternal Grandmother    Heart disease Maternal Grandfather    Stomach cancer Paternal Grandfather    Colon cancer Neg Hx    Colon polyps Neg Hx    Esophageal cancer Neg Hx    Rectal cancer Neg Hx    Pancreatic cancer Neg Hx    Liver cancer Neg Hx      Review of Systems  Constitutional: Negative.  Negative for chills and fever.  HENT: Negative.  Negative for congestion and sore throat.   Respiratory: Negative.  Negative for cough and shortness of breath.   Cardiovascular: Negative.  Negative for chest pain and palpitations.  Gastrointestinal:  Negative for abdominal pain, diarrhea, nausea and vomiting.  Genitourinary:  Positive for frequency and urgency. Negative for dysuria, flank pain and hematuria.  Musculoskeletal:  Positive for back pain.  Skin: Negative.  Negative for rash.  Neurological:  Negative for dizziness and headaches.  All other systems reviewed and are negative.  Today's Vitals   03/05/21 1045  BP: 138/78  Pulse: 72  Temp: 98.3 F (36.8 C)  TempSrc: Oral  SpO2: 96%  Weight: 269 lb (122 kg)  Height: 5\' 11"  (1.803 m)   Body mass index is 37.52 kg/m. Wt Readings from Last 3 Encounters:  03/05/21 269 lb (122 kg)  10/24/20 268 lb (121.6 kg)  09/05/20 269 lb (122 kg)    Physical Exam Vitals reviewed.  Constitutional:      Appearance: Normal appearance.  HENT:     Head: Normocephalic.  Eyes:     Extraocular Movements: Extraocular movements intact.     Conjunctiva/sclera: Conjunctivae normal.     Pupils: Pupils are equal, round, and reactive to light.  Cardiovascular:     Rate and Rhythm: Normal rate and regular rhythm.     Pulses: Normal pulses.     Heart sounds: Normal heart sounds.  Pulmonary:     Effort: Pulmonary effort is normal.     Breath  sounds: Normal breath sounds.  Musculoskeletal:     Cervical back: Normal range of motion and neck supple.     Right lower leg: Edema present.     Left lower leg: Edema present.     Comments: Mild edema to lower extremities with skin findings of chronic venous insufficiency  Skin:    Capillary Refill: Capillary refill takes less than 2 seconds.  Neurological:     General: No focal deficit present.     Mental Status: He is alert and oriented to person, place, and time.  Psychiatric:        Mood and Affect: Mood normal.        Behavior: Behavior normal.     ASSESSMENT & PLAN: A total of 30 minutes was spent with the patient and counseling/coordination of care regarding preparation for this visit, multiple chronic medical problems and their management, review of all medications and changes made, hypertension and cardiovascular risks associated with this condition, review of most recent office visit notes, treatment and management of chronic lumbar pain, need for orthopedic evaluation and possible lumbar spine MRI, lower urinary tract symptoms and need for urology evaluation, prognosis, documentation and need for follow-up.  Essential hypertension Well-controlled hypertension presently on Zestoretic 10-12.5 mg daily However due to lower urinary tract symptoms may benefit from stopping hydrochlorothiazide. Will start lisinopril 40 mg daily instead.  Chronic left-sided low back pain with left-sided sciatica Symptoms started to affect quality of life.  Also complaining of stock- like  sensation on distal left lower leg.  No recent MRI to review.  Needs orthopedic evaluation.  Lower urinary tract symptoms Overactive bladder symptoms while on Flomax 0.4 mg at bedtime. Needs urology evaluation.  Rehaan was seen today for hypertension.  Diagnoses and all orders for this visit:  Chronic left-sided low back pain with left-sided sciatica -     Ambulatory referral to Orthopedic  Surgery  Spinal stenosis of  lumbar region, unspecified whether neurogenic claudication present  Essential hypertension -     lisinopril (ZESTRIL) 40 MG tablet; Take 1 tablet (40 mg total) by mouth daily.  Lower urinary tract symptoms -     Ambulatory referral to Urology  Need for shingles vaccine -     Varicella-zoster vaccine IM (Shingrix)  Patient Instructions  Sciatica  Sciatica is pain, weakness, tingling, or loss of feeling (numbness) along the sciatic nerve. The sciatic nerve starts in the lower back and goes down the back of each leg. Sciatica usually goes away on its own or with treatment. Sometimes, sciatica may come back (recur). What are the causes? This condition happens when the sciatic nerve is pinched or has pressure put on it. This may be the result of: A disk in between the bones of the spine bulging out too far (herniated disk). Changes in the spinal disks that occur with aging. A condition that affects a muscle in the butt. Extra bone growth near the sciatic nerve. A break (fracture) of the area between your hip bones (pelvis). Pregnancy. Tumor. This is rare. What increases the risk? You are more likely to develop this condition if you: Play sports that put pressure or stress on the spine. Have poor strength and ease of movement (flexibility). Have had a back injury in the past. Have had back surgery. Sit for long periods of time. Do activities that involve bending or lifting over and over again. Are very overweight (obese). What are the signs or symptoms? Symptoms can vary from mild to very bad. They may include: Any of these problems in the lower back, leg, hip, or butt: Mild tingling, loss of feeling, or dull aches. Burning sensations. Sharp pains. Loss of feeling in the back of the calf or the sole of the foot. Leg weakness. Very bad back pain that makes it hard to move. These symptoms may get worse when you cough, sneeze, or laugh. They may alsoget  worse when you sit or stand for long periods of time. How is this treated? This condition often gets better without any treatment. However, treatment may include: Changing or cutting back on physical activity when you have pain. Doing exercises and stretching. Putting ice or heat on the affected area. Medicines that help: To relieve pain and swelling. To relax your muscles. Shots (injections) of medicines that help to relieve pain, irritation, and swelling. Surgery. Follow these instructions at home: Medicines Take over-the-counter and prescription medicines only as told by your doctor. Ask your doctor if the medicine prescribed to you: Requires you to avoid driving or using heavy machinery. Can cause trouble pooping (constipation). You may need to take these steps to prevent or treat trouble pooping: Drink enough fluids to keep your pee (urine) pale yellow. Take over-the-counter or prescription medicines. Eat foods that are high in fiber. These include beans, whole grains, and fresh fruits and vegetables. Limit foods that are high in fat and sugar. These include fried or sweet foods. Managing pain     If told, put ice on the affected area. Put ice in a plastic bag. Place a towel between your skin and the bag. Leave the ice on for 20 minutes, 2-3 times a day. If told, put heat on the affected area. Use the heat source that your doctor tells you to use, such as a moist heat pack or a heating pad. Place a towel between your skin and the heat source. Leave the heat on for 20-30 minutes. Remove  the heat if your skin turns bright red. This is very important if you are unable to feel pain, heat, or cold. You may have a greater risk of getting burned. Activity  Return to your normal activities as told by your doctor. Ask your doctor what activities are safe for you. Avoid activities that make your symptoms worse. Take short rests during the day. When you rest for a long time, do some  physical activity or stretching between periods of rest. Avoid sitting for a long time without moving. Get up and move around at least one time each hour. Exercise and stretch regularly, as told by your doctor. Do not lift anything that is heavier than 10 lb (4.5 kg) while you have symptoms of sciatica. Avoid lifting heavy things even when you do not have symptoms. Avoid lifting heavy things over and over. When you lift objects, always lift in a way that is safe for your body. To do this, you should: Bend your knees. Keep the object close to your body. Avoid twisting.  General instructions Stay at a healthy weight. Wear comfortable shoes that support your feet. Avoid wearing high heels. Avoid sleeping on a mattress that is too soft or too hard. You might have less pain if you sleep on a mattress that is firm enough to support your back. Keep all follow-up visits as told by your doctor. This is important. Contact a doctor if: You have pain that: Wakes you up when you are sleeping. Gets worse when you lie down. Is worse than the pain you have had in the past. Lasts longer than 4 weeks. You lose weight without trying. Get help right away if: You cannot control when you pee (urinate) or poop (have a bowel movement). You have weakness in any of these areas and it gets worse: Lower back. The area between your hip bones. Butt. Legs. You have redness or swelling of your back. You have a burning feeling when you pee. Summary Sciatica is pain, weakness, tingling, or loss of feeling (numbness) along the sciatic nerve. This condition happens when the sciatic nerve is pinched or has pressure put on it. Sciatica can cause pain, tingling, or loss of feeling (numbness) in the lower back, legs, hips, and butt. Treatment often includes rest, exercise, medicines, and putting ice or heat on the affected area. This information is not intended to replace advice given to you by your health care  provider. Make sure you discuss any questions you have with your healthcare provider. Document Revised: 09/06/2018 Document Reviewed: 09/06/2018 Elsevier Patient Education  2022 [employer], MD [facility]

## 2021-03-05 NOTE — Assessment & Plan Note (Signed)
Overactive bladder symptoms while on Flomax 0.4 mg at bedtime. Needs urology evaluation.

## 2021-03-05 NOTE — Patient Instructions (Signed)

## 2021-03-05 NOTE — Assessment & Plan Note (Signed)
Well-controlled hypertension presently on Zestoretic 10-12.5 mg daily However due to lower urinary tract symptoms may benefit from stopping hydrochlorothiazide. Will start lisinopril 40 mg daily instead.

## 2021-03-05 NOTE — Assessment & Plan Note (Signed)
Symptoms started to affect quality of life.  Also complaining of stock- like  sensation on distal left lower leg.  No recent MRI to review.  Needs orthopedic evaluation.

## 2021-03-08 ENCOUNTER — Telehealth: Payer: Self-pay | Admitting: Orthopaedic Surgery

## 2021-03-08 NOTE — Telephone Encounter (Signed)
Received vm from patient stating he has upcoming appt with Dr. Lorin Mercy 8/3 and he would like to drop off his xray CD from [facility] today and wanted to know how to do that. IC, lmvm advising he can bring that in and the receptionist can accept it, to instruct receptionist to give to [facility] for 8/3 appt. He is also having records faxed. [phone].

## 2021-03-11 NOTE — Telephone Encounter (Signed)
noted 

## 2021-03-21 ENCOUNTER — Other Ambulatory Visit: Payer: Self-pay

## 2021-03-21 ENCOUNTER — Ambulatory Visit (AMBULATORY_SURGERY_CENTER): Payer: Medicare Other | Admitting: *Deleted

## 2021-03-21 VITALS — Ht 71.0 in | Wt 260.0 lb

## 2021-03-21 DIAGNOSIS — K2270 Barrett's esophagus without dysplasia: Secondary | ICD-10-CM

## 2021-03-21 NOTE — Progress Notes (Signed)
Pt's previsit is done over the phone and all paperwork (prep instructions, blank consent form to just read over) sent to patient.  Pt's name and DOB verified at the beginning of the previsit.  Pt denies any difficulty with ambulating.    No trouble with anesthesia, denies being told they were difficult to intubate, or hx/fam hx of malignant hyperthermia per pt   No egg or soy allergy  No home oxygen use   No medications for weight loss taken  

## 2021-04-02 ENCOUNTER — Encounter: Payer: Self-pay | Admitting: Internal Medicine

## 2021-04-02 ENCOUNTER — Other Ambulatory Visit: Payer: Self-pay

## 2021-04-02 ENCOUNTER — Ambulatory Visit (AMBULATORY_SURGERY_CENTER): Payer: Medicare PPO | Admitting: Internal Medicine

## 2021-04-02 VITALS — BP 122/61 | HR 57 | Temp 97.4°F | Resp 11 | Ht 71.0 in | Wt 260.0 lb

## 2021-04-02 DIAGNOSIS — K229 Disease of esophagus, unspecified: Secondary | ICD-10-CM

## 2021-04-02 DIAGNOSIS — K2270 Barrett's esophagus without dysplasia: Secondary | ICD-10-CM

## 2021-04-02 DIAGNOSIS — K317 Polyp of stomach and duodenum: Secondary | ICD-10-CM | POA: Diagnosis not present

## 2021-04-02 DIAGNOSIS — K449 Diaphragmatic hernia without obstruction or gangrene: Secondary | ICD-10-CM | POA: Diagnosis not present

## 2021-04-02 DIAGNOSIS — K219 Gastro-esophageal reflux disease without esophagitis: Secondary | ICD-10-CM

## 2021-04-02 DIAGNOSIS — K22710 Barrett's esophagus with low grade dysplasia: Secondary | ICD-10-CM

## 2021-04-02 MED ORDER — SODIUM CHLORIDE 0.9 % IV SOLN
500.0000 mL | Freq: Once | INTRAVENOUS | Status: DC
Start: 2021-04-02 — End: 2021-04-02

## 2021-04-02 NOTE — Progress Notes (Signed)
Pt's states no medical or surgical changes since previsit or office visit. Vitals by CW 

## 2021-04-02 NOTE — Op Note (Signed)
[facility] Patient Name: Gregg Smith Procedure Date: 04/02/2021 10:04 AM MRN: [id number] Endoscopist: Docia Chuck. Henrene Pastor , MD Age: 75 Referring MD:  Date of Birth: 1946-08-25 Gender: Male Account #: [id number] Procedure:                Upper GI endoscopy with biopsies Indications:              Follow-up of Barrett's esophagus Medicines:                Monitored Anesthesia Care Procedure:                Pre-Anesthesia Assessment:                           - Prior to the procedure, a History and Physical                            was performed, and patient medications and                            allergies were reviewed. The patient's tolerance of                            previous anesthesia was also reviewed. The risks                            and benefits of the procedure and the sedation                            options and risks were discussed with the patient.                            All questions were answered, and informed consent                            was obtained. Prior Anticoagulants: The patient has                            taken no previous anticoagulant or antiplatelet                            agents. ASA Grade Assessment: II - A patient with                            mild systemic disease. After reviewing the risks                            and benefits, the patient was deemed in                            satisfactory condition to undergo the procedure.                           After obtaining informed consent, the endoscope was  passed under direct vision. Throughout the                            procedure, the patient's blood pressure, pulse, and                            oxygen saturations were monitored continuously. The                            Endoscope was introduced through the mouth, and                            advanced to the second part of duodenum. The upper                            GI  endoscopy was accomplished without difficulty.                            The patient tolerated the procedure well. Scope In: Scope Out: Findings:                 The esophagus was normal. No evidence for Barrett's                            (by endoscopic definition). The area of previously                            removed/ablated dysplastic nodule just shows                            scarring. Multiple biopsies of the area of interest                            were taken with a cold forceps for histology.                           The stomach revealed a sliding hiatal hernia benign                            fundic gland type polyps.                           The examined duodenum was normal.                           The cardia and gastric fundus were normal on                            retroflexion nausea pedal hernia. Complications:            No immediate complications. Estimated Blood Loss:     Estimated blood loss: none. Impression:               1. Scarring in region of previous dysplastic  nodule/Barrett's. Multiple biopsies                           2. GERD                           3. Hiatal hernia and benign fundic gland polyps. Recommendation:           - Patient has a contact number available for                            emergencies. The signs and symptoms of potential                            delayed complications were discussed with the                            patient. Return to normal activities tomorrow.                            Written discharge instructions were provided to the                            patient.                           - Resume previous diet.                           - Continue present medications. Continue                            lansoprazole 30 mg twice daily                           - Await pathology results.                           -Repeat EGD in 1 to 2 years pending biopsy results Docia Chuck.  Henrene Pastor, MD 04/02/2021 10:30:38 AM This report has been signed electronically.

## 2021-04-02 NOTE — Progress Notes (Signed)
Called to room to assist during endoscopic procedure.  Patient ID and intended procedure confirmed with present staff. Received instructions for my participation in the procedure from the performing physician.  

## 2021-04-02 NOTE — Patient Instructions (Signed)
Impression/Recommendations:  GERD/hiatal hernia handout given to patient.  Resume previous diet. Continue present medications. Continue Lansoprazole 30 mg 2 times daily, Await pathology results.  Repeat EGD in 1-2 years pending biopsy results.  YOU HAD AN ENDOSCOPIC PROCEDURE TODAY AT [facility]:   Refer to the procedure report that was given to you for any specific questions about what was found during the examination.  If the procedure report does not answer your questions, please call your gastroenterologist to clarify.  If you requested that your care partner not be given the details of your procedure findings, then the procedure report has been included in a sealed envelope for you to review at your convenience later.  YOU SHOULD EXPECT: Some feelings of bloating in the abdomen. Passage of more gas than usual.  Walking can help get rid of the air that was put into your GI tract during the procedure and reduce the bloating. If you had a lower endoscopy (such as a colonoscopy or flexible sigmoidoscopy) you may notice spotting of blood in your stool or on the toilet paper. If you underwent a bowel prep for your procedure, you may not have a normal bowel movement for a few days.  Please Note:  You might notice some irritation and congestion in your nose or some drainage.  This is from the oxygen used during your procedure.  There is no need for concern and it should clear up in a day or so.  SYMPTOMS TO REPORT IMMEDIATELY:  Following upper endoscopy (EGD)  Vomiting of blood or coffee ground material  New chest pain or pain under the shoulder blades  Painful or persistently difficult swallowing  New shortness of breath  Fever of 100F or higher  Black, tarry-looking stools  For urgent or emergent issues, a gastroenterologist can be reached at any hour by calling [phone]. Do not use MyChart messaging for urgent concerns.    DIET:  We do recommend a small meal at  first, but then you may proceed to your regular diet.  Drink plenty of fluids but you should avoid alcoholic beverages for 24 hours.  ACTIVITY:  You should plan to take it easy for the rest of today and you should NOT DRIVE or use heavy machinery until tomorrow (because of the sedation medicines used during the test).    FOLLOW UP: Our staff will call the number listed on your records 48-72 hours following your procedure to check on you and address any questions or concerns that you may have regarding the information given to you following your procedure. If we do not reach you, we will leave a message.  We will attempt to reach you two times.  During this call, we will ask if you have developed any symptoms of COVID 19. If you develop any symptoms (ie: fever, flu-like symptoms, shortness of breath, cough etc.) before then, please call [phone].  If you test positive for Covid 19 in the 2 weeks post procedure, please call and report this information to [place].    If any biopsies were taken you will be contacted by phone or by letter within the next 1-3 weeks.  Please call us at [phone] if you have not heard about the biopsies in 3 weeks.    SIGNATURES/CONFIDENTIALITY: You and/or your care partner have signed paperwork which will be entered into your electronic medical record.  These signatures attest to the fact that that the information above on your After Visit Summary has been  reviewed and is understood.  Full responsibility of the confidentiality of this discharge information lies with you and/or your care-partner.

## 2021-04-02 NOTE — Progress Notes (Signed)
Pt in recovery with monitors in place, VSS. Report given to receiving RN. Bite guard was placed with pt awake to ensure comfort. No dental or soft tissue damage noted. RN will remove the guard when the pt is awake.  

## 2021-04-03 ENCOUNTER — Encounter: Payer: Self-pay | Admitting: Orthopaedic Surgery

## 2021-04-03 ENCOUNTER — Ambulatory Visit: Payer: Medicare PPO | Admitting: Orthopaedic Surgery

## 2021-04-03 VITALS — BP 128/82 | HR 67 | Ht 71.0 in | Wt 265.0 lb

## 2021-04-03 DIAGNOSIS — M48061 Spinal stenosis, lumbar region without neurogenic claudication: Secondary | ICD-10-CM

## 2021-04-03 DIAGNOSIS — M419 Scoliosis, unspecified: Secondary | ICD-10-CM | POA: Diagnosis not present

## 2021-04-03 DIAGNOSIS — G8929 Other chronic pain: Secondary | ICD-10-CM

## 2021-04-03 DIAGNOSIS — M545 Low back pain, unspecified: Secondary | ICD-10-CM

## 2021-04-04 ENCOUNTER — Encounter: Payer: Self-pay | Admitting: Internal Medicine

## 2021-04-04 ENCOUNTER — Telehealth: Payer: Self-pay

## 2021-04-04 NOTE — Progress Notes (Signed)
Office Visit Note   Patient: Gregg Smith           Date of Birth: 1946/02/26           MRN: [id number] Visit Date: 04/03/2021              Requested by: Horald Pollen, [place],  [place] [address] PCP: Horald Pollen, MD   Assessment & Plan: Visit Diagnoses:  1. Chronic bilateral low back pain, unspecified whether sciatica present   2. Scoliosis, unspecified scoliosis type, unspecified spinal region   3. Spinal stenosis of lumbar region, unspecified whether neurogenic claudication present     Plan: We will set patient up for some physical therapy with core strengthening since he would like to transition into a home program.  His MRI report from 11/20/2018 showed L3-4 caudal posterior lateral extrusion short pedicles.  Some disc bulge at L4-5 paracentral to the left.  L5-S1 showed some S1 posterior lateral foraminal small disc protrusion on the left and small on the right.  Follow-Up Instructions: Return in about 6 weeks (around 05/15/2021).   Orders:  Orders Placed This Encounter  Procedures   Ambulatory referral to Physical Therapy   No orders of the defined types were placed in this encounter.     Procedures: No procedures performed   Clinical Data: No additional findings.   Subjective: Chief Complaint  Patient presents with   Left Leg - Numbness    HPI 75 year old male previously followed at [facility] with multiple injections by Dr. Herma Mering states he like to have his care office today with the [facility] system.  He said chronic back pain since age 36.  Bad case sciatica 2 years ago.  States she is actually feeling better currently.  Sometimes he still has some pins-and-needles laterally in his left leg.  Last MRI was 1 year ago.  He has been through chiropractic treatment, physical therapy, epidural steroid injections.  He had increased pain back in 2020.  Previous scan showed some evidence of disc degeneration with some  degree of stenosis.  No current claudication symptoms.  Review of Systems previous knee arthroplasty.  Positive sleep apnea insomnia.  Patient works part-time as professor.  Negative for heart attack or seizures.  All other systems noncontributory.   Objective: Vital Signs: BP 128/82   Pulse 67   Ht '5\' 11"'$  (1.803 m)   Wt 265 lb (120.2 kg)   BMI 36.96 kg/m   Physical Exam Constitutional:      Appearance: He is well-developed.  HENT:     Head: Normocephalic and atraumatic.     Right Ear: External ear normal.     Left Ear: External ear normal.  Eyes:     Pupils: Pupils are equal, round, and reactive to light.  Neck:     Thyroid: No thyromegaly.     Trachea: No tracheal deviation.  Cardiovascular:     Rate and Rhythm: Normal rate.  Pulmonary:     Effort: Pulmonary effort is normal.     Breath sounds: No wheezing.  Abdominal:     General: Bowel sounds are normal.     Palpations: Abdomen is soft.  Musculoskeletal:     Cervical back: Neck supple.  Skin:    General: Skin is warm and dry.     Capillary Refill: Capillary refill takes less than 2 seconds.  Neurological:     Mental Status: He is alert and oriented to person, place, and time.  Psychiatric:        Behavior: Behavior normal.        Thought Content: Thought content normal.        Judgment: Judgment normal.    Ortho Exam negative logroll the hips negative straight leg raising 90 degrees he is amatory without limping.  He gets easily from sitting to standing.  Anterior tib gastrocsoleus is strong.  Specialty Comments:  No specialty comments available.  Imaging: No results found.   PMFS History: Patient Active Problem List   Diagnosis Date Noted   Chronic left-sided low back pain with left-sided sciatica 03/05/2021   Lower urinary tract symptoms 03/05/2021   Osteoarthritis of knee 01/31/2021   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   Scoliosis deformity of spine 04/22/2019   Generalized  anxiety disorder    Panic disorder    Insomnia    Spinal stenosis of lumbar region 11/30/2018   Dental plaque 11/02/2018   Lumbar radiculopathy 10/06/2018   Sciatica 09/01/2018   GERD (gastroesophageal reflux disease) 08/14/2017   S/P left TKA 08/11/2016   S/P knee replacement 08/11/2016   OSA (obstructive sleep apnea) 03/22/2014   Snoring disorder 05/09/2013   Essential hypertension 02/11/2013   Other and unspecified hyperlipidemia 02/11/2013   Arthritis 02/11/2013   Past Medical History:  Diagnosis Date   Allergy    seasonal    Anxiety    Arthritis    Barrett's esophagus    Cataract    Chicken pox    Colon polyps    hyperplastic   Diverticulosis    GERD (gastroesophageal reflux disease)    Hemorrhoids    Hyperlipidemia    Hypertension    Leg cramps    Measles    Mumps    Pneumonia    Rhinitis    Sleep apnea    dental device   Snoring disorder 05/09/2013    Family History  Problem Relation Age of Onset   Alzheimer's disease Mother    Cancer Father    Stomach cancer Father    Aortic aneurysm Son    Heart disease Son    Cancer Maternal Grandmother    Heart disease Maternal Grandfather    Stomach cancer Paternal Grandfather    Colon cancer Neg Hx    Colon polyps Neg Hx    Esophageal cancer Neg Hx    Rectal cancer Neg Hx    Pancreatic cancer Neg Hx    Liver cancer Neg Hx     Past Surgical History:  Procedure Laterality Date   APPENDECTOMY  1957   CATARACT EXTRACTION     COLONOSCOPY     JOINT REPLACEMENT     TONSILLECTOMY     TONSILLECTOMY AND ADENOIDECTOMY  1954   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   Procedure: LEFT TOTAL KNEE ARTHROPLASTY;  Surgeon: Paralee Cancel, MD;  Location: [facility];  Service: Orthopedics;  Laterality: Left;   UPPER GASTROINTESTINAL ENDOSCOPY     Social History   Occupational History    Comment: Professor,[employer] Education administrator: [employer]  Tobacco Use   Smoking status: Former    Packs/day: 1.00    Years:  40.00    Pack years: 40.00    Types: Cigarettes    Quit date: 01/19/2007    Years since quitting: 14.2   Smokeless tobacco: Never  Vaping Use   Vaping Use: Never used  Substance and Sexual Activity   Alcohol use: Yes    Alcohol/week: 2.0  standard drinks    Types: 2 Cans of beer per week   Drug use: No   Sexual activity: Yes    Birth control/protection: None

## 2021-04-04 NOTE — Telephone Encounter (Signed)
  Follow up Call-  Call back number 04/02/2021 12/13/2019 05/18/2019 04/28/2019  Post procedure Call Back phone  # [phone]  Permission to leave phone message Yes Yes Yes Yes  Some recent data might be hidden     Patient questions:  Do you have a fever, pain , or abdominal swelling? No. Pain Score  0 *  Have you tolerated food without any problems? Yes.    Have you been able to return to your normal activities? Yes.    Do you have any questions about your discharge instructions: Diet   No. Medications  No. Follow up visit  No.  Do you have questions or concerns about your Care? No.  Actions: * If pain score is 4 or above: No action needed, pain <4. Have you developed a fever since your procedure? no  2.   Have you had an respiratory symptoms (SOB or cough) since your procedure? no  3.   Have you tested positive for COVID 19 since your procedure no  4.   Have you had any family members/close contacts diagnosed with the COVID 19 since your procedure?  no   If yes to any of these questions please route to Joylene John, RN and Joella Prince, RN

## 2021-04-11 ENCOUNTER — Ambulatory Visit: Payer: Medicare PPO | Admitting: Rehabilitative and Restorative Service Providers"

## 2021-04-11 ENCOUNTER — Encounter: Payer: Self-pay | Admitting: Rehabilitative and Restorative Service Providers"

## 2021-04-11 ENCOUNTER — Other Ambulatory Visit: Payer: Self-pay

## 2021-04-11 DIAGNOSIS — M5416 Radiculopathy, lumbar region: Secondary | ICD-10-CM | POA: Diagnosis not present

## 2021-04-11 DIAGNOSIS — M6281 Muscle weakness (generalized): Secondary | ICD-10-CM

## 2021-04-11 DIAGNOSIS — M5442 Lumbago with sciatica, left side: Secondary | ICD-10-CM | POA: Diagnosis not present

## 2021-04-11 DIAGNOSIS — G8929 Other chronic pain: Secondary | ICD-10-CM

## 2021-04-11 DIAGNOSIS — R293 Abnormal posture: Secondary | ICD-10-CM | POA: Diagnosis not present

## 2021-04-11 DIAGNOSIS — R262 Difficulty in walking, not elsewhere classified: Secondary | ICD-10-CM | POA: Diagnosis not present

## 2021-04-11 NOTE — Patient Instructions (Addendum)
Access Code: [email or web address] URL: [email or web address] Date: 04/11/2021 Prepared by: Vista Mink  Exercises Single Knee to Chest Stretch - 2-3 x daily - 7 x weekly - 1 sets - 5 reps - 20 seconds hold Supine Figure 4 Piriformis Stretch - 2-3 x daily - 7 x weekly - 1 sets - 5 reps - 20 seconds hold Standing Lumbar Extension at Scarbro - 5 x daily - 7 x weekly - 1 sets - 5 reps - 3 seconds hold Standing Scapular Retraction - 5 x daily - 7 x weekly - 1 sets - 5 reps - 5 second hold

## 2021-04-11 NOTE — Therapy (Signed)
[facility] [address] [place], [place], [address] Phone: [phone]   Fax:  [phone]  Physical Therapy Evaluation  Patient Details  Name: Gregg Smith MRN: [id number] Date of Birth: [birth year] Referring Provider (PT): Marybelle Killings MD  Referring diagnosis? M54.50  G89.29 Treatment diagnosis? (if different than referring diagnosis) R29.3  R26.2  M54.16  M54.42  G89.29  M62.81 What was this (referring dx) caused by? '[]'$  Surgery '[]'$  Fall '[x]'$  Ongoing issue '[]'$  Arthritis '[]'$  Other: ____________  Laterality: '[]'$  Rt '[x]'$  Lt '[]'$  Both  Check all possible CPT codes:      '[x]'$  97110 (Therapeutic Exercise)  '[]'$  92507 (SLP Treatment)  '[x]'$  97112 (Neuro Re-ed)   '[]'$  92526 (Swallowing Treatment)   '[x]'$  97116 (Gait Training)   '[]'$  [id number] (Cognitive Training, 1st 15 minutes) '[x]'$  97140 (Manual Therapy)   '[]'$  97130 (Cognitive Training, each add'l 15 minutes)  '[x]'$  97530 (Therapeutic Activities)  '[]'$  Other, List CPT Code ____________    '[x]'$  [id number] (Self Care)       '[x]'$  All codes above (97110 - 97535)  '[x]'$  97012 (Mechanical Traction)  '[]'$  97014 (E-stim Unattended)  '[]'$  97032 (E-stim manual)  '[]'$  97033 (Ionto)  '[]'$  97035 (Ultrasound)  '[]'$  97760 (Orthotic Fit) '[x]'$  97750 (Physical Performance Training) '[]'$  [id number] (Aquatic Therapy) '[]'$  97034 (Contrast Bath) '[]'$  [id number] (Paraffin) '[]'$  97597 (Wound Care 1st 20 sq cm) '[]'$  97598 (Wound Care each add'l 20 sq cm) '[]'$  97016 (Vasopneumatic Device) '[]'$  [id number] Comptroller) '[]'$  [id number] (Prosthetic Training)  Encounter Date: 04/11/2021   PT End of Session - 04/11/21 1538     Visit Number 1    Number of Visits 16    Authorization Type [employer]    Progress Note Due on Visit 10    PT Start Time 1430    PT Stop Time 1525    PT Time Calculation (min) 55 min    Activity Tolerance Patient tolerated treatment well;No increased pain;Patient limited by fatigue    Behavior During Therapy [facility] for tasks assessed/performed              Past Medical History:  Diagnosis Date   Allergy    seasonal    Anxiety    Arthritis    Barrett's esophagus    Cataract    Chicken pox    Colon polyps    hyperplastic   Diverticulosis    GERD (gastroesophageal reflux disease)    Hemorrhoids    Hyperlipidemia    Hypertension    Leg cramps    Measles    Mumps    Pneumonia    Rhinitis    Sleep apnea    dental device   Snoring disorder 05/09/2013    Past Surgical History:  Procedure Laterality Date   APPENDECTOMY  1957   CATARACT EXTRACTION     COLONOSCOPY     JOINT REPLACEMENT     TONSILLECTOMY     TONSILLECTOMY AND ADENOIDECTOMY  1954   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   Procedure: LEFT TOTAL KNEE ARTHROPLASTY;  Surgeon: Paralee Cancel, MD;  Location: [facility];  Service: Orthopedics;  Laterality: Left;   UPPER GASTROINTESTINAL ENDOSCOPY      There were no vitals filed for this visit.    Subjective Assessment - 04/11/21 1533     Subjective Gregg Smith has had low back pain and L sciatica for > 2 years.  Sciatica is much less sesvere but constant.  He is concerned things have not improved beyond this and is  seeking skilled PT intervention.    Pertinent History Previous L TKA, R knee needs TKA, HTN    Limitations Sitting;House hold activities;Lifting;Standing;Walking    Patient Stated Goals Improve low back and L LE pain, get in better shape overall    Currently in Pain? Yes    Pain Score 5     Pain Location Back    Pain Orientation Lower    Pain Descriptors / Indicators Sore;Aching;Tingling;Numbness   & L LE   Pain Type Chronic pain    Pain Radiating Towards L foot    Pain Onset More than a month ago    Pain Frequency Constant    Aggravating Factors  Occasional jabs but constant symptoms    Effect of Pain on Daily Activities Limited endurance and general deconditioning    Multiple Pain Sites No                [facility] PT Assessment - 04/11/21 0001       Assessment   Medical Diagnosis Chronic B LBP     Referring Provider (PT) Marybelle Killings MD      Precautions   Precautions Back    Precaution Comments Avoid flexion and slouched postures      Restrictions   Weight Bearing Restrictions No      Balance Screen   Has the patient fallen in the past 6 months No    Has the patient had a decrease in activity level because of a fear of falling?  No    Is the patient reluctant to leave their home because of a fear of falling?  No      Prior Function   Level of Independence Independent    Vocation Requirements Sitting at a computer      Cognition   Overall Cognitive Status Within Functional Limits for tasks assessed      Observation/Other Assessments   Focus on Therapeutic Outcomes (FOTO)  52 (Goal 71 visit 10)      ROM / Strength   AROM / PROM / Strength AROM;Strength      AROM   Overall AROM  Deficits    AROM Assessment Site Lumbar;Hip    Right/Left Hip Left;Right    Right Hip Flexion 85    Right Hip External Rotation  33    Right Hip Internal Rotation  0    Left Hip Flexion 90    Left Hip External Rotation  24    Left Hip Internal Rotation  8    Lumbar Extension 10      Strength   Overall Strength Deficits    Strength Assessment Site Lumbar      Flexibility   Soft Tissue Assessment /Muscle Length yes    Hamstrings 30 degrees B                        Objective measurements completed on examination: See above findings.       [facility] PT Treatment/Exercise - 04/11/21 0001       Therapeutic Activites    Therapeutic Activities ADL's    ADL's Gardening postures, computer sitting mechanics, info on intradiscal pressure and posture      Exercises   Exercises Lumbar      Lumbar Exercises: Stretches   Single Knee to Chest Stretch 4 reps;20 seconds;Left;Right    Standing Extension 10 reps;Limitations    Standing Extension Limitations 3 seconds hips forward    Figure 4 Stretch 4 reps;20  seconds;With overpressure      Lumbar Exercises: Standing    Shoulder ADduction Strengthening;Both;10 reps;Limitations    Shoulder Adduction Limitations 5 seconds shoulder blade pinches                    PT Education - 04/11/21 1537     Education Details Spent a lot of time discussing posture (particularly seated at work), gardening and info on intradiscal pressure in various postures.  Starter HEP and reviewed exam findings.    Person(s) Educated Patient    Methods Explanation;Tactile cues;Verbal cues;Handout;Demonstration    Comprehension Verbalized understanding;Tactile cues required;Need further instruction;Verbal cues required;Returned demonstration              PT Short Term Goals - 04/11/21 1544       PT SHORT TERM GOAL #1   Title Gregg Smith will be independent with his starter HEP.    Time 3    Period Weeks    Status New    Target Date 05/02/21      PT SHORT TERM GOAL #2   Title Gregg Smith will report less frequent L LE symptoms, particularly distal to the knee.    Time 4    Period Weeks    Status New    Target Date 05/09/21               PT Long Term Goals - 04/11/21 1545       PT LONG TERM GOAL #1   Title Improve FOTO to 71.    Baseline 52    Time 8    Period Weeks    Status New    Target Date 06/06/21      PT LONG TERM GOAL #2   Title Gregg Smith will improve low back and L LE symptoms consistently 0-3/10 on the VAS.    Baseline Can be 5+/10    Time 8    Period Weeks    Status New    Target Date 06/06/21      PT LONG TERM GOAL #3   Title Improve flexibility for hip flexors to 100; hip ER to 40 and hamstrings to 45 degrees.    Baseline See eval    Time 8    Period Weeks    Status New    Target Date 06/06/21      PT LONG TERM GOAL #4   Title Gregg Smith will be independnet with his long-term HEP at [facility].    Time 8    Period Weeks    Status New    Target Date 06/06/21                    Plan - 04/11/21 1539     Clinical Impression Statement Gregg Smith has had low back pain and L sciatica for  > 2 years.  L sciatica symptoms were very bad in early 2020 and have improved.  They haven't changed much over the past ~ 2 years and he is concerned they will be permanent if he doesn't get them addressed.  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  Gregg Smith is a good PT candidate for general conditioning and spine rehabilitation.    Personal Factors and Comorbidities Fitness;Comorbidity 3+    Comorbidities Previous L TKA and R needs TKA, HTN    Examination-Activity Limitations Sit;Bed Mobility;Bend;Lift;Squat;Locomotion Level;Carry    Examination-Participation Restrictions  Occupation;Yard Work;Community Activity    Stability/Clinical Decision Making Stable/Uncomplicated    Clinical Decision Making Low    Rehab Potential Good    PT Frequency 2x / week    PT Duration 8 weeks    PT Treatment/Interventions ADLs/Self Care Home Management;Moist Heat;Cryotherapy;Traction;Therapeutic activities;Therapeutic exercise;Neuromuscular re-education;Patient/family education;Manual techniques;Dry needling    PT Next Visit Plan Review HEP, progress low back strength and practical posture/body mechanics    PT Home Exercise Plan [email or web address]    Consulted and Agree with Plan of Care Patient             Patient will benefit from skilled therapeutic intervention in order to improve the following deficits and impairments:  Decreased activity tolerance, Decreased endurance, Decreased range of motion, Decreased strength, Difficulty walking, Increased edema, Impaired flexibility, Increased muscle spasms, Postural dysfunction, Improper body mechanics, Obesity, Pain  Visit Diagnosis: Abnormal posture  Difficulty walking  Radiculopathy, lumbar region  Chronic bilateral low back pain with left-sided sciatica  Muscle weakness (generalized)     Problem List Patient Active Problem List   Diagnosis  Date Noted   Chronic left-sided low back pain with left-sided sciatica 03/05/2021   Lower urinary tract symptoms 03/05/2021   Osteoarthritis of knee 01/31/2021   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   Scoliosis deformity of spine 04/22/2019   Generalized anxiety disorder    Panic disorder    Insomnia    Spinal stenosis of lumbar region 11/30/2018   Dental plaque 11/02/2018   Lumbar radiculopathy 10/06/2018   Sciatica 09/01/2018   GERD (gastroesophageal reflux disease) 08/14/2017   S/P left TKA 08/11/2016   S/P knee replacement 08/11/2016   OSA (obstructive sleep apnea) 03/22/2014   Snoring disorder 05/09/2013   Essential hypertension 02/11/2013   Other and unspecified hyperlipidemia 02/11/2013   Arthritis 02/11/2013    Farley Ly PT, MPT 04/11/2021, 3:48 PM  [facility] [address] [place], [place], [address] Phone: [phone]   Fax:  [phone]  Name: Gregg Smith MRN: [id number] Date of Birth: April 05, 1946

## 2021-04-12 ENCOUNTER — Other Ambulatory Visit: Payer: Self-pay | Admitting: Internal Medicine

## 2021-04-12 DIAGNOSIS — K2270 Barrett's esophagus without dysplasia: Secondary | ICD-10-CM

## 2021-04-12 DIAGNOSIS — K219 Gastro-esophageal reflux disease without esophagitis: Secondary | ICD-10-CM

## 2021-04-16 ENCOUNTER — Ambulatory Visit: Payer: Medicare PPO | Admitting: Rehabilitative and Restorative Service Providers"

## 2021-04-16 ENCOUNTER — Other Ambulatory Visit: Payer: Self-pay

## 2021-04-16 DIAGNOSIS — R262 Difficulty in walking, not elsewhere classified: Secondary | ICD-10-CM | POA: Diagnosis not present

## 2021-04-16 DIAGNOSIS — M6281 Muscle weakness (generalized): Secondary | ICD-10-CM

## 2021-04-16 DIAGNOSIS — M5416 Radiculopathy, lumbar region: Secondary | ICD-10-CM

## 2021-04-16 DIAGNOSIS — R293 Abnormal posture: Secondary | ICD-10-CM

## 2021-04-16 DIAGNOSIS — M5442 Lumbago with sciatica, left side: Secondary | ICD-10-CM

## 2021-04-16 DIAGNOSIS — G8929 Other chronic pain: Secondary | ICD-10-CM

## 2021-04-16 NOTE — Therapy (Signed)
[facility] [address] [place], [place], [address] Phone: [phone]   Fax:  [phone]  Physical Therapy Treatment  Patient Details  Name: Gregg Smith MRN: [id number] Date of Birth: May 06, 1946 Referring Provider (PT): Marybelle Killings MD   Encounter Date: 04/16/2021   PT End of Session - 04/16/21 1459     Visit Number 2    Number of Visits 16    Date for PT Re-Evaluation 06/06/21    Authorization Type [employer] - 12 visits through 06/07/2021    Authorization - Visit Number 2    Authorization - Number of Visits 12    Progress Note Due on Visit 10    PT Start Time 1429    PT Stop Time 1509    PT Time Calculation (min) 40 min    Activity Tolerance Patient tolerated treatment well    Behavior During Therapy [facility] for tasks assessed/performed             Past Medical History:  Diagnosis Date   Allergy    seasonal    Anxiety    Arthritis    Barrett's esophagus    Cataract    Chicken pox    Colon polyps    hyperplastic   Diverticulosis    GERD (gastroesophageal reflux disease)    Hemorrhoids    Hyperlipidemia    Hypertension    Leg cramps    Measles    Mumps    Pneumonia    Rhinitis    Sleep apnea    dental device   Snoring disorder 05/09/2013    Past Surgical History:  Procedure Laterality Date   APPENDECTOMY  1957   CATARACT EXTRACTION     COLONOSCOPY     JOINT REPLACEMENT     TONSILLECTOMY     TONSILLECTOMY AND [place] ARTHROPLASTY Left 08/11/2016   Procedure: LEFT TOTAL KNEE ARTHROPLASTY;  Surgeon: Paralee Cancel, MD;  Location: [facility];  Service: Orthopedics;  Laterality: Left;   UPPER GASTROINTESTINAL ENDOSCOPY      There were no vitals filed for this visit.   Subjective Assessment - 04/16/21 1501     Subjective Pt. reported that he felt like there was some intensity reduction overall but still overall having similiar symptom location.    Pertinent History Previous Lt TKA, Rt knee  needs TKA, HTN    Limitations Sitting;House hold activities;Lifting;Standing;Walking    Patient Stated Goals Improve low back and L LE pain, get in better shape overall    Currently in Pain? No/denies   tingling not pain   Pain Score --   mild   Pain Location Leg    Pain Orientation Left    Pain Descriptors / Indicators Tightness    Pain Type Chronic pain    Pain Onset More than a month ago    Pain Frequency Constant    Aggravating Factors  consistent symptoms                               [facility] Adult PT Treatment/Exercise - 04/16/21 0001       Lumbar Exercises: Stretches   Single Knee to Chest Stretch 20 seconds;Left;Right;5 reps    Standing Extension 5 seconds;10 reps   reviewed from HEP   Figure 4 Stretch 20 seconds;5 reps   bilateral     Lumbar Exercises: Aerobic   Nustep Lvl 5 10 mins for aerobic exercise  Lumbar Exercises: Standing   Other Standing Lumbar Exercises scapular retraction review 5 sec hold x 1      Lumbar Exercises: Supine   Bridge 3 seconds;15 reps                      PT Short Term Goals - 04/16/21 1451       PT SHORT TERM GOAL #1   Title Raemond will be independent with his starter HEP.    Time 3    Period Weeks    Status On-going    Target Date 05/02/21      PT SHORT TERM GOAL #2   Title Kaelyn will report less frequent L LE symptoms, particularly distal to the knee.    Time 4    Period Weeks    Status On-going    Target Date 05/09/21               PT Long Term Goals - 04/11/21 1545       PT LONG TERM GOAL #1   Title Improve FOTO to 71.    Baseline 52    Time 8    Period Weeks    Status New    Target Date 06/06/21      PT LONG TERM GOAL #2   Title Keltyn will improve low back and L LE symptoms consistently 0-3/10 on the VAS.    Baseline Can be 5+/10    Time 8    Period Weeks    Status New    Target Date 06/06/21      PT LONG TERM GOAL #3   Title Improve flexibility for hip flexors  to 100; hip ER to 40 and hamstrings to 45 degrees.    Baseline See eval    Time 8    Period Weeks    Status New    Target Date 06/06/21      PT LONG TERM GOAL #4   Title Aimar will be independnet with his long-term HEP at [place].    Time 8    Period Weeks    Status New    Target Date 06/06/21                   Plan - 04/16/21 1449     Clinical Impression Statement Time spent today in review of HEP c good overall knowledge.  Occasional cues required but overall acceptable recall.  Continued to include mobility gains paired c hip strengthening to facilitate improved functional movement patterns. Continued skilled PT services indicated at this time.    Personal Factors and Comorbidities Fitness;Comorbidity 3+    Comorbidities Previous Lt TKA and Rt needs TKA, HTN    Examination-Activity Limitations Sit;Bed Mobility;Bend;Lift;Squat;Locomotion Level;Carry    Examination-Participation Restrictions Occupation;Yard Work;Community Activity    Stability/Clinical Decision Making Stable/Uncomplicated    Rehab Potential Good    PT Frequency 2x / week    PT Duration 8 weeks    PT Treatment/Interventions ADLs/Self Care Home Management;Moist Heat;Cryotherapy;Traction;Therapeutic activities;Therapeutic exercise;Neuromuscular re-education;Patient/family education;Manual techniques;Dry needling    PT Next Visit Plan Reassess centralization/peripherialization responses to directional specific movements lumbar, improve lumbar/hip mobility strength in ther ex/manual intervention.    PT Home Exercise Plan [id number]    Consulted and Agree with Plan of Care Patient             Patient will benefit from skilled therapeutic intervention in order to improve the following deficits and impairments:  Decreased activity tolerance, Decreased endurance, Decreased  range of motion, Decreased strength, Difficulty walking, Increased edema, Impaired flexibility, Increased muscle spasms, Postural dysfunction,  Improper body mechanics, Obesity, Pain  Visit Diagnosis: Abnormal posture  Difficulty walking  Radiculopathy, lumbar region  Chronic bilateral low back pain with left-sided sciatica  Muscle weakness (generalized)     Problem List Patient Active Problem List   Diagnosis Date Noted   Chronic left-sided low back pain with left-sided sciatica 03/05/2021   Lower urinary tract symptoms 03/05/2021   Osteoarthritis of knee 01/31/2021   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   Scoliosis deformity of spine 04/22/2019   Generalized anxiety disorder    Panic disorder    Insomnia    Spinal stenosis of lumbar region 11/30/2018   Dental plaque 11/02/2018   Lumbar radiculopathy 10/06/2018   Sciatica 09/01/2018   GERD (gastroesophageal reflux disease) 08/14/2017   S/P left TKA 08/11/2016   S/P knee replacement 08/11/2016   OSA (obstructive sleep apnea) 03/22/2014   Snoring disorder 05/09/2013   Essential hypertension 02/11/2013   Other and unspecified hyperlipidemia 02/11/2013   Arthritis 02/11/2013   Scot Jun, PT, DPT, OCS, ATC 04/16/21  3:05 PM    [facility] [address] [place], [place], [address] Phone: [phone]   Fax:  [phone]  Name: Gregg Smith MRN: [id number] Date of Birth: 05-04-1946

## 2021-04-17 ENCOUNTER — Ambulatory Visit: Payer: Medicare PPO | Admitting: Cardiology

## 2021-04-23 ENCOUNTER — Other Ambulatory Visit: Payer: Self-pay

## 2021-04-23 ENCOUNTER — Ambulatory Visit: Payer: Medicare PPO | Admitting: Rehabilitative and Restorative Service Providers"

## 2021-04-23 ENCOUNTER — Encounter: Payer: Self-pay | Admitting: Rehabilitative and Restorative Service Providers"

## 2021-04-23 DIAGNOSIS — G8929 Other chronic pain: Secondary | ICD-10-CM

## 2021-04-23 DIAGNOSIS — M5416 Radiculopathy, lumbar region: Secondary | ICD-10-CM | POA: Diagnosis not present

## 2021-04-23 DIAGNOSIS — R293 Abnormal posture: Secondary | ICD-10-CM

## 2021-04-23 DIAGNOSIS — M6281 Muscle weakness (generalized): Secondary | ICD-10-CM

## 2021-04-23 DIAGNOSIS — R262 Difficulty in walking, not elsewhere classified: Secondary | ICD-10-CM | POA: Diagnosis not present

## 2021-04-23 DIAGNOSIS — M5442 Lumbago with sciatica, left side: Secondary | ICD-10-CM | POA: Diagnosis not present

## 2021-04-23 NOTE — Therapy (Signed)
[facility] [address] [place], [place], [address] Phone: [phone]   Fax:  [phone]  Physical Therapy Treatment  Patient Details  Name: Gregg Smith MRN: [id number] Date of Birth: 05-Feb-1946 Referring Provider (PT): Marybelle Killings MD   Encounter Date: 04/23/2021   PT End of Session - 04/23/21 1433     Visit Number 3    Number of Visits 16    Date for PT Re-Evaluation 06/06/21    Authorization Type [employer] - 12 visits through 06/07/2021    Authorization - Visit Number 3    Authorization - Number of Visits 12    Progress Note Due on Visit 10    PT Start Time 1429    PT Stop Time 1458    PT Time Calculation (min) 29 min    Activity Tolerance Patient tolerated treatment well    Behavior During Therapy [facility] for tasks assessed/performed             Past Medical History:  Diagnosis Date   Allergy    seasonal    Anxiety    Arthritis    Barrett's esophagus    Cataract    Chicken pox    Colon polyps    hyperplastic   Diverticulosis    GERD (gastroesophageal reflux disease)    Hemorrhoids    Hyperlipidemia    Hypertension    Leg cramps    Measles    Mumps    Pneumonia    Rhinitis    Sleep apnea    dental device   Snoring disorder 05/09/2013    Past Surgical History:  Procedure Laterality Date   APPENDECTOMY  1957   CATARACT EXTRACTION     COLONOSCOPY     JOINT REPLACEMENT     TONSILLECTOMY     TONSILLECTOMY AND [place] ARTHROPLASTY Left 08/11/2016   Procedure: LEFT TOTAL KNEE ARTHROPLASTY;  Surgeon: Paralee Cancel, MD;  Location: [facility];  Service: Orthopedics;  Laterality: Left;   UPPER GASTROINTESTINAL ENDOSCOPY      There were no vitals filed for this visit.   Subjective Assessment - 04/23/21 1432     Subjective Pt. indicated the he felt like the intensity of leg symptoms continued to reduce overall.  Still noted in lateral to anterior Lt lower leg at this time.    Pertinent  History Previous Lt TKA, Rt knee needs TKA, HTN    Limitations Sitting;House hold activities;Lifting;Standing;Walking    Patient Stated Goals Improve low back and L LE pain, get in better shape overall    Currently in Pain? Yes    Pain Score 2     Pain Location Leg    Pain Orientation Left;Lower    Pain Descriptors / Indicators Tightness;Tingling    Pain Type Chronic pain    Pain Onset More than a month ago    Pain Frequency Constant    Aggravating Factors  nothing specific reported    Pain Relieving Factors general reduced since start of treatment                [facility] PT Assessment - 04/23/21 0001       Assessment   Medical Diagnosis Chronic bilateral LBP    Referring Provider (PT) Marybelle Killings MD      AROM   Lumbar Extension 75% WFL, no complaints in back, reduction in leg symptoms.  Repeated in standing x 10: continued reduced leg symptoms intensity c movement to 100%  WFL in lumbar                           [facility] Adult PT Treatment/Exercise - 04/23/21 0001       Self-Care   Self-Care Posture;Other Self-Care Comments    Posture Education and cues for lumbar support in sitting to prevent loss of lumbar lordosis as well as cues for avoiding sustained flexion at this time due to presentation of centralization directional preference for extension.    Other Self-Care Comments  Cues given on use of HEP throughout day for symptom relief.      Lumbar Exercises: Stretches   Standing Extension 15 reps   3 second     Lumbar Exercises: Aerobic   Nustep Lvl 6 10 mins for aerobic exercise                      PT Short Term Goals - 04/16/21 1451       PT SHORT TERM GOAL #1   Title Chawn will be independent with his starter HEP.    Time 3    Period Weeks    Status On-going    Target Date 05/02/21      PT SHORT TERM GOAL #2   Title Jamyson will report less frequent L LE symptoms, particularly distal to the knee.    Time 4    Period Weeks     Status On-going    Target Date 05/09/21               PT Long Term Goals - 04/23/21 1500       PT LONG TERM GOAL #1   Title Improve FOTO to 71.    Time 8    Period Weeks    Status On-going    Target Date 06/06/21      PT LONG TERM GOAL #2   Title Markhi will improve low back and L LE symptoms consistently 0-3/10 on the VAS.    Time 8    Period Weeks    Status On-going    Target Date 06/06/21      PT LONG TERM GOAL #3   Title Improve flexibility for hip flexors to 100; hip ER to 40 and hamstrings to 45 degrees.    Time 8    Period Weeks    Status On-going    Target Date 06/06/21      PT LONG TERM GOAL #4   Title Aristotle will be independnet with his long-term HEP at [place].    Time 8    Period Weeks    Status New                   Plan - 04/23/21 1500     Clinical Impression Statement Current presentation continued to indicate directional preference for lumbar extension for reduced intensity of lower leg symptoms.  Focused on education for continued use of extension based intervention for symptoms prn.  DIscussed with Pt. the transitioning towards HEP due to overall improvements.  Pt. expressed deisre for 1x/week and will be monitored for transition to HEP management as appropriate.    Personal Factors and Comorbidities Fitness;Comorbidity 3+    Comorbidities Previous Lt TKA and Rt needs TKA, HTN    Examination-Activity Limitations Sit;Bed Mobility;Bend;Lift;Squat;Locomotion Level;Carry    Examination-Participation Restrictions Occupation;Yard Work;Community Activity    Stability/Clinical Decision Making Stable/Uncomplicated    Rehab Potential Good    PT Frequency 1x /  week    PT Duration 8 weeks    PT Treatment/Interventions ADLs/Self Care Home Management;Moist Heat;Cryotherapy;Traction;Therapeutic activities;Therapeutic exercise;Neuromuscular re-education;Patient/family education;Manual techniques;Dry needling    PT Next Visit Plan Continue extension based  directional preference intervention.  Prepare for HEP transitioning.  Suggest FOTO update capture in next visit or two    PT Villalba and Agree with Plan of Care Patient             Patient will benefit from skilled therapeutic intervention in order to improve the following deficits and impairments:  Decreased activity tolerance, Decreased endurance, Decreased range of motion, Decreased strength, Difficulty walking, Increased edema, Impaired flexibility, Increased muscle spasms, Postural dysfunction, Improper body mechanics, Obesity, Pain  Visit Diagnosis: Abnormal posture  Difficulty walking  Radiculopathy, lumbar region  Chronic bilateral low back pain with left-sided sciatica  Muscle weakness (generalized)     Problem List Patient Active Problem List   Diagnosis Date Noted   Chronic left-sided low back pain with left-sided sciatica 03/05/2021   Lower urinary tract symptoms 03/05/2021   Osteoarthritis of knee 01/31/2021   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   Scoliosis deformity of spine 04/22/2019   Generalized anxiety disorder    Panic disorder    Insomnia    Spinal stenosis of lumbar region 11/30/2018   Dental plaque 11/02/2018   Lumbar radiculopathy 10/06/2018   Sciatica 09/01/2018   GERD (gastroesophageal reflux disease) 08/14/2017   S/P left TKA 08/11/2016   S/P knee replacement 08/11/2016   OSA (obstructive sleep apnea) 03/22/2014   Snoring disorder 05/09/2013   Essential hypertension 02/11/2013   Other and unspecified hyperlipidemia 02/11/2013   Arthritis 02/11/2013    Scot Jun, PT, DPT, OCS, ATC 04/23/21  3:04 PM    [facility] [address] [place], [place], [address] Phone: [phone]   Fax:  [phone]  Name: Gregg Smith MRN: [id number] Date of Birth: [birth year]

## 2021-04-25 ENCOUNTER — Encounter: Payer: Medicare PPO | Admitting: Physical Therapy

## 2021-04-27 ENCOUNTER — Other Ambulatory Visit: Payer: Self-pay | Admitting: Podiatry

## 2021-05-01 ENCOUNTER — Ambulatory Visit: Payer: Medicare PPO | Admitting: Rehabilitative and Restorative Service Providers"

## 2021-05-01 ENCOUNTER — Encounter: Payer: Self-pay | Admitting: Rehabilitative and Restorative Service Providers"

## 2021-05-01 ENCOUNTER — Other Ambulatory Visit: Payer: Self-pay

## 2021-05-01 DIAGNOSIS — M5416 Radiculopathy, lumbar region: Secondary | ICD-10-CM | POA: Diagnosis not present

## 2021-05-01 DIAGNOSIS — R293 Abnormal posture: Secondary | ICD-10-CM | POA: Diagnosis not present

## 2021-05-01 DIAGNOSIS — R262 Difficulty in walking, not elsewhere classified: Secondary | ICD-10-CM | POA: Diagnosis not present

## 2021-05-01 DIAGNOSIS — M6281 Muscle weakness (generalized): Secondary | ICD-10-CM

## 2021-05-01 DIAGNOSIS — M5442 Lumbago with sciatica, left side: Secondary | ICD-10-CM

## 2021-05-01 DIAGNOSIS — G8929 Other chronic pain: Secondary | ICD-10-CM

## 2021-05-01 NOTE — Therapy (Signed)
[facility] [address] [place], [place], [address] Phone: [phone]   Fax:  [phone]  Physical Therapy Treatment  Patient Details  Name: Gregg Smith MRN: [id number] Date of Birth: November 15, 1945 Referring Provider (PT): Marybelle Killings MD   Encounter Date: 05/01/2021   PT End of Session - 05/01/21 1642     Visit Number 4    Number of Visits 16    Date for PT Re-Evaluation 06/06/21    Authorization Type [employer] - 12 visits through 06/07/2021    Authorization - Visit Number 4    Authorization - Number of Visits 12    Progress Note Due on Visit 10    PT Start Time [id number]    PT Stop Time 1428    PT Time Calculation (min) 43 min    Activity Tolerance Patient tolerated treatment well;No increased pain    Behavior During Therapy WFL for tasks assessed/performed             Past Medical History:  Diagnosis Date   Allergy    seasonal    Anxiety    Arthritis    Barrett's esophagus    Cataract    Chicken pox    Colon polyps    hyperplastic   Diverticulosis    GERD (gastroesophageal reflux disease)    Hemorrhoids    Hyperlipidemia    Hypertension    Leg cramps    Measles    Mumps    Pneumonia    Rhinitis    Sleep apnea    dental device   Snoring disorder 05/09/2013    Past Surgical History:  Procedure Laterality Date   APPENDECTOMY  1957   CATARACT EXTRACTION     COLONOSCOPY     JOINT REPLACEMENT     TONSILLECTOMY     TONSILLECTOMY AND ADENOIDECTOMY  1954   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   Procedure: LEFT TOTAL KNEE ARTHROPLASTY;  Surgeon: Paralee Cancel, MD;  Location: [facility];  Service: Orthopedics;  Laterality: Left;   UPPER GASTROINTESTINAL ENDOSCOPY      There were no vitals filed for this visit.   Subjective Assessment - 05/01/21 1402     Subjective Kree notes significant pain progress since starting PT.  He is symptom free in the AM and symptoms have moved more proximal to the knee from the ankle.   Late day fatigue and paresthesias are present.    Pertinent History Previous Lt TKA, Rt knee needs TKA, HTN    Limitations Sitting;House hold activities;Lifting;Standing;Walking    How long can you sit comfortably? 60 minutes    How long can you stand comfortably? Unlimited by pain, only fatigue    How long can you walk comfortably? 100 yards gets tired then "warms-up" and he can go further    Patient Stated Goals Improve low back and L LE pain, get in better shape overall    Currently in Pain? No/denies    Pain Score 0-No pain    Pain Location Leg    Pain Orientation Left;Lower    Pain Descriptors / Indicators Tingling    Pain Type Chronic pain    Pain Radiating Towards To mid shin (was to the lateral foot)    Pain Onset More than a month ago    Pain Frequency Intermittent    Aggravating Factors  Prolonged postures and WB    Pain Relieving Factors Exercises and change of position    Effect of Pain on Daily Activities Limited endurance  and general deconditioning (fatigue later in the day)    Multiple Pain Sites No                               [facility] Adult PT Treatment/Exercise - 05/01/21 0001       Therapeutic Activites    Therapeutic Activities ADL's    ADL's Reviewed gardening mechanics, log roll and bed mobility.  Touched on golfer's lift.      Exercises   Exercises Lumbar      Lumbar Exercises: Stretches   Active Hamstring Stretch Left;Right;4 reps;20 seconds    Single Knee to Chest Stretch 4 reps;20 seconds;Left;Right    Standing Extension 10 reps;Limitations    Standing Extension Limitations 3 seconds hips forward    Figure 4 Stretch 4 reps;20 seconds;With overpressure      Lumbar Exercises: Standing   Shoulder ADduction Strengthening;Both;10 reps;Limitations    Shoulder Adduction Limitations 5 seconds (SBP)    Other Standing Lumbar Exercises Alternating hip hike 10X 3 seconds      Lumbar Exercises: Prone   Straight Leg Raise 10 reps;3 seconds                     PT Education - 05/01/21 1641     Education Details Reviewed body mechanics, HEP and made progressions to HEP (mostly strength).    Person(s) Educated Patient    Methods Explanation;Demonstration;Verbal cues;Handout    Comprehension Verbal cues required;Need further instruction;Returned demonstration;Verbalized understanding              PT Short Term Goals - 05/01/21 1642       PT SHORT TERM GOAL #1   Title Ramal will be independent with his starter HEP.    Time 3    Period Weeks    Status Achieved    Target Date 05/02/21      PT SHORT TERM GOAL #2   Title Akeil will report less frequent L LE symptoms, particularly distal to the knee.    Time 4    Period Weeks    Status Achieved    Target Date 05/09/21               PT Long Term Goals - 04/23/21 1500       PT LONG TERM GOAL #1   Title Improve FOTO to 71.    Time 8    Period Weeks    Status On-going    Target Date 06/06/21      PT LONG TERM GOAL #2   Title Gershon will improve low back and L LE symptoms consistently 0-3/10 on the VAS.    Time 8    Period Weeks    Status On-going    Target Date 06/06/21      PT LONG TERM GOAL #3   Title Improve flexibility for hip flexors to 100; hip ER to 40 and hamstrings to 45 degrees.    Time 8    Period Weeks    Status On-going    Target Date 06/06/21      PT LONG TERM GOAL #4   Title Hudson will be independnet with his long-term HEP at [place].    Time Wheeler - 05/01/21 [place]     Clinical Impression Statement Gwenlyn Perking  is making progress with his early PT.  Symptoms are less severe and more proximal.  He had good questions about posture and body mechanics that were addressed and will benefit from continued review.  RA in the next 1-2 visits if progress continues.    Personal Factors and Comorbidities Fitness;Comorbidity 3+    Comorbidities Previous Lt TKA and Rt needs TKA, HTN     Examination-Activity Limitations Sit;Bed Mobility;Bend;Lift;Squat;Locomotion Level;Carry    Examination-Participation Restrictions Occupation;Yard Work;Community Activity    Stability/Clinical Decision Making Stable/Uncomplicated    Rehab Potential Good    PT Frequency 1x / week    PT Duration 8 weeks    PT Treatment/Interventions ADLs/Self Care Home Management;Moist Heat;Cryotherapy;Traction;Therapeutic activities;Therapeutic exercise;Neuromuscular re-education;Patient/family education;Manual techniques;Dry needling    PT Next Visit Plan Strength progressions PRN, FOTO and lumbar extension AROM check.    PT Home Exercise Plan [id number]    Consulted and Agree with Plan of Care Patient             Patient will benefit from skilled therapeutic intervention in order to improve the following deficits and impairments:  Decreased activity tolerance, Decreased endurance, Decreased range of motion, Decreased strength, Difficulty walking, Increased edema, Impaired flexibility, Increased muscle spasms, Postural dysfunction, Improper body mechanics, Obesity, Pain  Visit Diagnosis: Abnormal posture  Difficulty walking  Radiculopathy, lumbar region  Chronic bilateral low back pain with left-sided sciatica  Muscle weakness (generalized)     Problem List Patient Active Problem List   Diagnosis Date Noted   Chronic left-sided low back pain with left-sided sciatica 03/05/2021   Lower urinary tract symptoms 03/05/2021   Osteoarthritis of knee 01/31/2021   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   Scoliosis deformity of spine 04/22/2019   Generalized anxiety disorder    Panic disorder    Insomnia    Spinal stenosis of lumbar region 11/30/2018   Dental plaque 11/02/2018   Lumbar radiculopathy 10/06/2018   Sciatica 09/01/2018   GERD (gastroesophageal reflux disease) 08/14/2017   S/P left TKA 08/11/2016   S/P knee replacement 08/11/2016   OSA (obstructive sleep apnea)  03/22/2014   Snoring disorder 05/09/2013   Essential hypertension 02/11/2013   Other and unspecified hyperlipidemia 02/11/2013   Arthritis 02/11/2013    Farley Ly PT, MPT 05/01/2021, 4:45 PM  [facility] [address] [place], [place], [address] Phone: [phone]   Fax:  [phone]  Name: JORDAAN CURET MRN: [id number] Date of Birth: [birth year]

## 2021-05-01 NOTE — Patient Instructions (Signed)
Added hamstrings stretch, hip hike and prone hip extension to HEP.

## 2021-05-03 ENCOUNTER — Encounter: Payer: Medicare PPO | Admitting: Rehabilitative and Restorative Service Providers"

## 2021-05-08 ENCOUNTER — Encounter: Payer: Self-pay | Admitting: Rehabilitative and Restorative Service Providers"

## 2021-05-08 ENCOUNTER — Ambulatory Visit: Payer: Medicare PPO | Admitting: Rehabilitative and Restorative Service Providers"

## 2021-05-08 ENCOUNTER — Other Ambulatory Visit: Payer: Self-pay

## 2021-05-08 ENCOUNTER — Ambulatory Visit (INDEPENDENT_AMBULATORY_CARE_PROVIDER_SITE_OTHER): Payer: Medicare PPO | Admitting: *Deleted

## 2021-05-08 DIAGNOSIS — R262 Difficulty in walking, not elsewhere classified: Secondary | ICD-10-CM

## 2021-05-08 DIAGNOSIS — M5442 Lumbago with sciatica, left side: Secondary | ICD-10-CM | POA: Diagnosis not present

## 2021-05-08 DIAGNOSIS — Z23 Encounter for immunization: Secondary | ICD-10-CM

## 2021-05-08 DIAGNOSIS — R293 Abnormal posture: Secondary | ICD-10-CM | POA: Diagnosis not present

## 2021-05-08 DIAGNOSIS — M5416 Radiculopathy, lumbar region: Secondary | ICD-10-CM | POA: Diagnosis not present

## 2021-05-08 DIAGNOSIS — G8929 Other chronic pain: Secondary | ICD-10-CM

## 2021-05-08 DIAGNOSIS — M6281 Muscle weakness (generalized): Secondary | ICD-10-CM

## 2021-05-08 NOTE — Therapy (Signed)
[facility] Physical Therapy [address] [place], [place], [address] Phone: [phone]   Fax:  [phone]  Physical Therapy Treatment  Patient Details  Name: Gregg Smith MRN: [id number] Date of Birth: Feb 11, 1946 Referring Provider (PT): Marybelle Killings MD   Encounter Date: 05/08/2021   PT End of Session - 05/08/21 1613     Visit Number 5    Number of Visits 16    Date for PT Re-Evaluation 06/06/21    Authorization Type [employer] - 12 visits through 06/07/2021    Authorization - Visit Number 5    Authorization - Number of Visits 12    Progress Note Due on Visit 10    PT Start Time [id number]    PT Stop Time 1556    PT Time Calculation (min) 41 min    Activity Tolerance Patient tolerated treatment well;No increased pain    Behavior During Therapy WFL for tasks assessed/performed             Past Medical History:  Diagnosis Date   Allergy    seasonal    Anxiety    Arthritis    Barrett's esophagus    Cataract    Chicken pox    Colon polyps    hyperplastic   Diverticulosis    GERD (gastroesophageal reflux disease)    Hemorrhoids    Hyperlipidemia    Hypertension    Leg cramps    Measles    Mumps    Pneumonia    Rhinitis    Sleep apnea    dental device   Snoring disorder 05/09/2013    Past Surgical History:  Procedure Laterality Date   APPENDECTOMY  1957   CATARACT EXTRACTION     COLONOSCOPY     JOINT REPLACEMENT     TONSILLECTOMY     TONSILLECTOMY AND ADENOIDECTOMY  1954   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   Procedure: LEFT TOTAL KNEE ARTHROPLASTY;  Surgeon: Paralee Cancel, MD;  Location: [facility];  Service: Orthopedics;  Laterality: Left;   UPPER GASTROINTESTINAL ENDOSCOPY      There were no vitals filed for this visit.   Subjective Assessment - 05/08/21 1520     Subjective Paresthesias are noted with sitting, standing and walking.  Symptoms are moving proximal.  Gerrell is sleeping fine.  Flexion and late day fatigue are most  limiting.    Pertinent History Previous Lt TKA, Rt knee needs TKA, HTN    Limitations Sitting;House hold activities;Lifting;Standing;Walking    How long can you sit comfortably? 60 minutes    How long can you stand comfortably? Unlimited by pain, only fatigue    How long can you walk comfortably? 100 yards gets tired then "warms-up" and he can go further    Patient Stated Goals Improve low back and L LE pain, get in better shape overall    Currently in Pain? No/denies    Pain Score 0-No pain    Pain Location Leg    Pain Orientation Left    Pain Descriptors / Indicators Tingling    Pain Type Chronic pain    Pain Radiating Towards To upper shin (was to the lateral foot)    Pain Onset More than a month ago    Pain Frequency Intermittent    Aggravating Factors  Prolonged postures (sitting) and late day fatigue    Pain Relieving Factors Exercise, change of position, postural correction    Effect of Pain on Daily Activities Poor endurance and limited endurance with late  day activities    Multiple Pain Sites No                               [facility] Adult PT Treatment/Exercise - 05/08/21 0001       Therapeutic Activites    Therapeutic Activities ADL's    ADL's Reviewed gardening mechanics, log roll, golfer's, diagonal squat lift and bed mobility.      Exercises   Exercises Lumbar      Lumbar Exercises: Stretches   Active Hamstring Stretch Left;Right;4 reps;20 seconds    Single Knee to Chest Stretch 4 reps;20 seconds;Left;Right    Standing Extension 10 reps;Limitations    Standing Extension Limitations 3 seconds hips forward    Figure 4 Stretch 4 reps;20 seconds;With overpressure      Lumbar Exercises: Standing   Shoulder ADduction Strengthening;Both;10 reps;Limitations    Shoulder Adduction Limitations 5 seconds (SBP)    Other Standing Lumbar Exercises Alternating hip hike 2 sets of 10X 3 seconds      Lumbar Exercises: Prone   Straight Leg Raise 10 reps;3  seconds;Limitations    Straight Leg Raises Limitations 2 sets                  Upper Extremity Functional Index Score :   /80   PT Education - 05/08/21 1611     Education Details Again reviewed body mechanics, the importance of consistency with body mechanics and HEP.  Needed correction with all areas.    Person(s) Educated Patient    Methods Explanation;Demonstration;Tactile cues;Verbal cues    Comprehension Verbal cues required;Need further instruction;Returned demonstration;Verbalized understanding;Tactile cues required              PT Short Term Goals - 05/01/21 1642       PT SHORT TERM GOAL #1   Title Akira will be independent with his starter HEP.    Time 3    Period Weeks    Status Achieved    Target Date 05/02/21      PT SHORT TERM GOAL #2   Title Kyshawn will report less frequent L LE symptoms, particularly distal to the knee.    Time 4    Period Weeks    Status Achieved    Target Date 05/09/21               PT Long Term Goals - 05/08/21 1612       PT LONG TERM GOAL #1   Title Improve FOTO to 71.    Baseline 83 (was 52)    Time 8    Period Weeks    Status Achieved      PT LONG TERM GOAL #2   Title Alanmichael will improve low back and L LE symptoms consistently 0-3/10 on the VAS.    Baseline Moving proximal but still can be significant.    Time 8    Period Weeks    Status On-going      PT LONG TERM GOAL #3   Title Improve flexibility for hip flexors to 100; hip ER to 40 and hamstrings to 45 degrees.    Time 8    Period Weeks    Status On-going      PT LONG TERM GOAL #4   Title Pramod will be independent with his long-term HEP at DC.    Time 8    Period Weeks    Status On-going  Plan - 05/08/21 1613     Clinical Impression Statement Amadi continues to give good effort with his home and clinic program.  Body mechanics and exercises will benefit from continued feedback and correction.  Symptoms are  moving proximal and overall symptoms are better.  Continue strength progressions to improve late day function and endurance.    Personal Factors and Comorbidities Fitness;Comorbidity 3+    Comorbidities Previous Lt TKA and Rt needs TKA, HTN    Examination-Activity Limitations Sit;Bed Mobility;Bend;Lift;Squat;Locomotion Level;Carry    Examination-Participation Restrictions Occupation;Yard Work;Community Activity    Stability/Clinical Decision Making Stable/Uncomplicated    Rehab Potential Good    PT Frequency 1x / week    PT Duration 8 weeks    PT Treatment/Interventions ADLs/Self Care Home Management;Moist Heat;Cryotherapy;Traction;Therapeutic activities;Therapeutic exercise;Neuromuscular re-education;Patient/family education;Manual techniques;Dry needling    PT Next Visit Plan Strength progressions and body mechanics review (avoid flexion).    PT Home Exercise Plan [id number]    Consulted and Agree with Plan of Care Patient             Patient will benefit from skilled therapeutic intervention in order to improve the following deficits and impairments:  Decreased activity tolerance, Decreased endurance, Decreased range of motion, Decreased strength, Difficulty walking, Increased edema, Impaired flexibility, Increased muscle spasms, Postural dysfunction, Improper body mechanics, Obesity, Pain  Visit Diagnosis: Abnormal posture  Difficulty walking  Radiculopathy, lumbar region  Chronic bilateral low back pain with left-sided sciatica  Muscle weakness (generalized)     Problem List Patient Active Problem List   Diagnosis Date Noted   Chronic left-sided low back pain with left-sided sciatica 03/05/2021   Lower urinary tract symptoms 03/05/2021   Osteoarthritis of knee 01/31/2021   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   Scoliosis deformity of spine 04/22/2019   Generalized anxiety disorder    Panic disorder    Insomnia    Spinal stenosis of lumbar region  11/30/2018   Dental plaque 11/02/2018   Lumbar radiculopathy 10/06/2018   Sciatica 09/01/2018   GERD (gastroesophageal reflux disease) 08/14/2017   S/P left TKA 08/11/2016   S/P knee replacement 08/11/2016   OSA (obstructive sleep apnea) 03/22/2014   Snoring disorder 05/09/2013   Essential hypertension 02/11/2013   Other and unspecified hyperlipidemia 02/11/2013   Arthritis 02/11/2013    Farley Ly, PT, MPT 05/08/2021, 4:16 PM  [facility] Physical Therapy [address] [place], [place], [address] Phone: [phone]   Fax:  [phone]  Name: Gregg Smith MRN: [id number] Date of Birth: 1945-10-10

## 2021-05-08 NOTE — Progress Notes (Signed)
Pls cosign for Shingrix inj../lmb  

## 2021-05-08 NOTE — Patient Instructions (Signed)
Continue current HEP 

## 2021-05-15 ENCOUNTER — Encounter: Payer: Self-pay | Admitting: Orthopaedic Surgery

## 2021-05-15 ENCOUNTER — Other Ambulatory Visit: Payer: Self-pay

## 2021-05-15 ENCOUNTER — Ambulatory Visit: Payer: Medicare PPO | Admitting: Orthopaedic Surgery

## 2021-05-15 VITALS — BP 162/88 | HR 73 | Ht 71.0 in | Wt 265.0 lb

## 2021-05-15 DIAGNOSIS — M5136 Other intervertebral disc degeneration, lumbar region: Secondary | ICD-10-CM | POA: Diagnosis not present

## 2021-05-15 DIAGNOSIS — M51369 Other intervertebral disc degeneration, lumbar region without mention of lumbar back pain or lower extremity pain: Secondary | ICD-10-CM

## 2021-05-15 NOTE — Progress Notes (Signed)
Office Visit Note   Patient: Gregg Smith           Date of Birth: 02-20-1946           MRN: [id number] Visit Date: 05/15/2021              Requested by: Horald Pollen, [place],  [place] [address] PCP: Horald Pollen, MD   Assessment & Plan: Visit Diagnoses:  1. Degeneration of lumbar intervertebral disc     Plan: Reviewed his previous x-rays he has some facet arthropathy disc base narrowing more pronounced at L4-5 and L5-S1.  He is gotten good relief with therapy and can follow-up with me on an as-needed basis.  Follow-Up Instructions: No follow-ups on file.   Orders:  No orders of the defined types were placed in this encounter.  No orders of the defined types were placed in this encounter.     Procedures: No procedures performed   Clinical Data: No additional findings.   Subjective: Chief Complaint  Patient presents with   Lower Back - Follow-up   Left Leg - Follow-up    HPI 75 year old male returns for follow-up of low back symptoms.  He has been through therapy states sometimes he still has a little bit of numbness in his left leg but feels like he is back walking normal and has increased strength and stamina and can ambulate as far as he would like.  No problem with standing.  Mobility is improved.Marland Kitchen  No chills or fever no bowel bladder associated symptoms.  Patient works as a Network engineer at [employer].  Review of Systems all other systems noncontributory to HPI.   Objective: Vital Signs: BP (!) 162/88   Pulse 73   Ht '5\' 11"'$  (1.803 m)   Wt 265 lb (120.2 kg)   BMI 36.96 kg/m   Physical Exam Constitutional:      Appearance: He is well-developed.  HENT:     Head: Normocephalic and atraumatic.     Right Ear: External ear normal.     Left Ear: External ear normal.  Eyes:     Pupils: Pupils are equal, round, and reactive to light.  Neck:     Thyroid: No thyromegaly.     Trachea: No tracheal deviation.   Cardiovascular:     Rate and Rhythm: Normal rate.  Pulmonary:     Effort: Pulmonary effort is normal.     Breath sounds: No wheezing.  Abdominal:     General: Bowel sounds are normal.     Palpations: Abdomen is soft.  Musculoskeletal:     Cervical back: Neck supple.  Skin:    General: Skin is warm and dry.     Capillary Refill: Capillary refill takes less than 2 seconds.  Neurological:     Mental Status: He is alert and oriented to person, place, and time.  Psychiatric:        Behavior: Behavior normal.        Thought Content: Thought content normal.        Judgment: Judgment normal.    Ortho Exam negative logroll of the hips.  Normal heel toe gait.  No isolated motor weakness lower extremity.  He gets from sitting to standing quickly no limping with ambulation.  Specialty Comments:  No specialty comments available.  Imaging: No results found.   PMFS History: Patient Active Problem List   Diagnosis Date Noted   Chronic left-sided low back pain with left-sided  sciatica 03/05/2021   Lower urinary tract symptoms 03/05/2021   Osteoarthritis of knee 01/31/2021   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   Scoliosis deformity of spine 04/22/2019   Generalized anxiety disorder    Panic disorder    Insomnia    Spinal stenosis of lumbar region 11/30/2018   Dental plaque 11/02/2018   Lumbar radiculopathy 10/06/2018   Sciatica 09/01/2018   GERD (gastroesophageal reflux disease) 08/14/2017   S/P left TKA 08/11/2016   S/P knee replacement 08/11/2016   OSA (obstructive sleep apnea) 03/22/2014   Snoring disorder 05/09/2013   Essential hypertension 02/11/2013   Other and unspecified hyperlipidemia 02/11/2013   Arthritis 02/11/2013   Past Medical History:  Diagnosis Date   Allergy    seasonal    Anxiety    Arthritis    Barrett's esophagus    Cataract    Chicken pox    Colon polyps    hyperplastic   Diverticulosis    GERD (gastroesophageal reflux disease)     Hemorrhoids    Hyperlipidemia    Hypertension    Leg cramps    Measles    Mumps    Pneumonia    Rhinitis    Sleep apnea    dental device   Snoring disorder 05/09/2013    Family History  Problem Relation Age of Onset   Alzheimer's disease Mother    Cancer Father    Stomach cancer Father    Aortic aneurysm Son    Heart disease Son    Cancer Maternal Grandmother    Heart disease Maternal Grandfather    Stomach cancer Paternal Grandfather    Colon cancer Neg Hx    Colon polyps Neg Hx    Esophageal cancer Neg Hx    Rectal cancer Neg Hx    Pancreatic cancer Neg Hx    Liver cancer Neg Hx     Past Surgical History:  Procedure Laterality Date   APPENDECTOMY  1957   CATARACT EXTRACTION     COLONOSCOPY     JOINT REPLACEMENT     TONSILLECTOMY     TONSILLECTOMY AND ADENOIDECTOMY  1954   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   Procedure: LEFT TOTAL KNEE ARTHROPLASTY;  Surgeon: Paralee Cancel, MD;  Location: [facility];  Service: Orthopedics;  Laterality: Left;   UPPER GASTROINTESTINAL ENDOSCOPY     Social History   Occupational History    Comment: Professor,[place]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

## 2021-05-16 ENCOUNTER — Ambulatory Visit (INDEPENDENT_AMBULATORY_CARE_PROVIDER_SITE_OTHER): Payer: Medicare PPO | Admitting: Rehabilitative and Restorative Service Providers"

## 2021-05-16 ENCOUNTER — Encounter: Payer: Self-pay | Admitting: Rehabilitative and Restorative Service Providers"

## 2021-05-16 DIAGNOSIS — M6281 Muscle weakness (generalized): Secondary | ICD-10-CM

## 2021-05-16 DIAGNOSIS — R293 Abnormal posture: Secondary | ICD-10-CM

## 2021-05-16 DIAGNOSIS — M5442 Lumbago with sciatica, left side: Secondary | ICD-10-CM

## 2021-05-16 DIAGNOSIS — R262 Difficulty in walking, not elsewhere classified: Secondary | ICD-10-CM

## 2021-05-16 DIAGNOSIS — M5416 Radiculopathy, lumbar region: Secondary | ICD-10-CM | POA: Diagnosis not present

## 2021-05-16 DIAGNOSIS — G8929 Other chronic pain: Secondary | ICD-10-CM

## 2021-05-16 NOTE — Patient Instructions (Signed)
Access Code: [email or web address] URL: [email or web address] Date: 05/16/2021 Prepared by: Scot Jun  Exercises Single Knee to Chest Stretch - 2 x daily - 7 x weekly - 1 sets - 5 reps - 20 seconds hold Supine Figure 4 Piriformis Stretch - 2 x daily - 7 x weekly - 1 sets - 5 reps - 20 seconds hold Standing Lumbar Extension at [place] - 5 x daily - 7 x weekly - 1 sets - 5 reps - 3 seconds hold Standing Scapular Retraction - 5 x daily - 7 x weekly - 1 sets - 5 reps - 5 second hold Supine Hamstring Stretch - 2 x daily - 7 x weekly - 1 sets - 5 reps - 20 seconds hold Standing Hip Hiking - 2 x daily - 7 x weekly - 2 sets - 10 reps - 3 seconds hold Prone Hip Extension - 2 x daily - 7 x weekly - 1 sets - 10 reps - 3 seconds hold

## 2021-05-16 NOTE — Therapy (Addendum)
[facility] Physical Therapy [address] [place], [place], [address] Phone: [phone]   Fax:  [phone]  Physical Therapy Treatment  Patient Details  Name: Gregg Smith MRN: [id number] Date of Birth: Jul 07, 1946 Referring Provider (PT): Marybelle Killings MD  PHYSICAL THERAPY DISCHARGE SUMMARY  Visits from Start of Care: 6  Current functional level related to goals / functional outcomes: See note   Remaining deficits: See note   Education / Equipment: HEP   Patient agrees to discharge. Patient goals were partially met. Patient is being discharged due to not returning since the last visit.  Encounter Date: 05/16/2021   PT End of Session - 05/16/21 1312     Visit Number 6    Number of Visits 16    Date for PT Re-Evaluation 06/06/21    Authorization Type [employer] - 12 visits through 06/07/2021    Authorization - Visit Number 6    Authorization - Number of Visits 12    Progress Note Due on Visit 10    PT Start Time 1300    PT Stop Time 1326    PT Time Calculation (min) 26 min    Activity Tolerance Patient tolerated treatment well    Behavior During Therapy WFL for tasks assessed/performed             Past Medical History:  Diagnosis Date   Allergy    seasonal    Anxiety    Arthritis    Barrett's esophagus    Cataract    Chicken pox    Colon polyps    hyperplastic   Diverticulosis    GERD (gastroesophageal reflux disease)    Hemorrhoids    Hyperlipidemia    Hypertension    Leg cramps    Measles    Mumps    Pneumonia    Rhinitis    Sleep apnea    dental device   Snoring disorder 05/09/2013    Past Surgical History:  Procedure Laterality Date   APPENDECTOMY  1957   CATARACT EXTRACTION     COLONOSCOPY     JOINT REPLACEMENT     TONSILLECTOMY     TONSILLECTOMY AND ADENOIDECTOMY  1954   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   Procedure: LEFT TOTAL KNEE ARTHROPLASTY;  Surgeon: Paralee Cancel, MD;  Location: [facility];  Service:  Orthopedics;  Laterality: Left;   UPPER GASTROINTESTINAL ENDOSCOPY      There were no vitals filed for this visit.   Subjective Assessment - 05/16/21 1304     Subjective Paresthesias are noted with sitting, standing and walking.  Symptoms are moving proximal.  Honest is sleeping fine.  Flexion and late day fatigue are most limiting.    Pertinent History Previous Lt TKA, Rt knee needs TKA, HTN    Limitations Sitting;House hold activities;Lifting;Standing;Walking    How long can you sit comfortably? 60 minutes    How long can you stand comfortably? Unlimited by pain, only fatigue    How long can you walk comfortably? 100 yards gets tired then "warms-up" and he can go further    Patient Stated Goals Improve low back and L LE pain, get in better shape overall    Currently in Pain? No/denies    Pain Score 0-No pain    Pain Orientation Left    Pain Descriptors / Indicators Tingling   2/10 intensity   Pain Type Chronic pain    Pain Onset More than a month ago  [facility] PT Assessment - 05/16/21 0001       Assessment   Medical Diagnosis Chronic bilateral LBP    Referring Provider (PT) Marybelle Killings MD      Observation/Other Assessments   Focus on Therapeutic Outcomes (FOTO)  eval was 69, updated 05/08/2021 to 83      AROM   Lumbar Extension 100% WFL                           [facility] Adult PT Treatment/Exercise - 05/16/21 0001       Self-Care   Posture Continued education of use of extension based exercise and posiitoning for centralization attempts for Lt leg.  Conversely, limiting flexion to be beneficial.    Other Self-Care Comments  Continued review verbally c review of print out for continued use of HEP going forward.  Answered various posiitoning/technique questions.      Lumbar Exercises: Aerobic   Nustep Lvl 6 10 mins                     PT Education - 05/16/21 1322     Education Details Reviewed HEP (see self care)     Person(s) Educated Patient    Methods Explanation;Demonstration;Verbal cues    Comprehension Verbalized understanding              PT Short Term Goals - 05/01/21 1642       PT SHORT TERM GOAL #1   Title Sriram will be independent with his starter HEP.    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    Time 4    Period Weeks    Status Achieved    Target Date 05/09/21               PT Long Term Goals - 05/16/21 1322       PT LONG TERM GOAL #1   Title Improve FOTO to 71.    Time 8    Period Weeks    Status Achieved      PT LONG TERM GOAL #2   Title Ayham will improve low back and L LE symptoms consistently 0-3/10 on the VAS.    Time 8    Period Weeks    Status Achieved      PT LONG TERM GOAL #3   Title Improve flexibility for hip flexors to 100; hip ER to 40 and hamstrings to 45 degrees.    Time 8    Period Weeks    Status On-going    Target Date 06/06/21      PT LONG TERM GOAL #4   Title Woody will be independent with his long-term HEP at DC.    Time 8    Period Weeks    Status Achieved                   Plan - 05/16/21 1320     Clinical Impression Statement After review with Pt. today, indications pointed to trial HEP at this time c hold of in clinic visits at this time.  Pt. was in agreement c plan.  Review of HEP was performed c good knowledge.  Though mild symptoms still noted, Pt. showed promise for continued improvement  c HEP use.  Instructions for return (calling MD office for referral if greater than 30 days)    Personal Factors and Comorbidities Fitness;Comorbidity 3+    Comorbidities Previous Lt TKA and Rt needs TKA, HTN    Examination-Activity Limitations Sit;Bed Mobility;Bend;Lift;Squat;Locomotion Level;Carry    Examination-Participation Restrictions Occupation;Yard Work;Community Activity     Stability/Clinical Decision Making Stable/Uncomplicated    Rehab Potential Good    PT Frequency 1x / week    PT Duration 8 weeks    PT Treatment/Interventions ADLs/Self Care Home Management;Moist Heat;Cryotherapy;Traction;Therapeutic activities;Therapeutic exercise;Neuromuscular re-education;Patient/family education;Manual techniques;Dry needling    PT Next Visit Plan Hold PT c trial HEP performance at this time.    PT Home Exercise Plan [email or web address]    Consulted and Agree with Plan of Care Patient             Patient will benefit from skilled therapeutic intervention in order to improve the following deficits and impairments:  Decreased activity tolerance, Decreased endurance, Decreased range of motion, Decreased strength, Difficulty walking, Increased edema, Impaired flexibility, Increased muscle spasms, Postural dysfunction, Improper body mechanics, Obesity, Pain  Visit Diagnosis: Abnormal posture  Difficulty walking  Radiculopathy, lumbar region  Chronic bilateral low back pain with left-sided sciatica  Muscle weakness (generalized)     Problem List Patient Active Problem List   Diagnosis Date Noted   Chronic left-sided low back pain with left-sided sciatica 03/05/2021   Lower urinary tract symptoms 03/05/2021   Osteoarthritis of knee 01/31/2021   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   Scoliosis deformity of spine 04/22/2019   Generalized anxiety disorder    Panic disorder    Insomnia    Spinal stenosis of lumbar region 11/30/2018   Dental plaque 11/02/2018   Lumbar radiculopathy 10/06/2018   Sciatica 09/01/2018   GERD (gastroesophageal reflux disease) 08/14/2017   S/P left TKA 08/11/2016   S/P knee replacement 08/11/2016   OSA (obstructive sleep apnea) 03/22/2014   Snoring disorder 05/09/2013   Essential hypertension 02/11/2013   Other and unspecified hyperlipidemia 02/11/2013   Arthritis 02/11/2013    Scot Jun, PT, DPT, OCS,  ATC 05/16/21  1:24 PM  Farley Ly PT, MPT  [facility] Physical Therapy [address] [place], [place], [address] Phone: [phone]   Fax:  [phone]  Name: Gregg Smith MRN: [id number] Date of Birth: 1945/09/04

## 2021-05-19 ENCOUNTER — Other Ambulatory Visit: Payer: Self-pay | Admitting: Emergency Medicine

## 2021-05-19 DIAGNOSIS — I10 Essential (primary) hypertension: Secondary | ICD-10-CM

## 2021-05-21 ENCOUNTER — Encounter: Payer: Self-pay | Admitting: Emergency Medicine

## 2021-05-23 ENCOUNTER — Encounter: Payer: Medicare PPO | Admitting: Rehabilitative and Restorative Service Providers"

## 2021-06-02 NOTE — Progress Notes (Signed)
Cardiology Office Note:    Date:  06/05/2021   ID:  BURNETTE SAUTTER, DOB 1946-04-22, MRN [id number]  PCP:  Horald Pollen, MD  Cardiologist:  None  Electrophysiologist:  None   Referring MD: Horald Pollen, *   Chief Complaint: follow-up of chest pain  History of Present Illness:    Gregg Smith is a 75 y.o. male with a history of chest pain with normal coronaries on coronary CTA in 01/2020, hypertension, hyperlipidemia, GERD with Barrett's esophagus, and sleep apnea not on CPAP who is followed by Dr. Gardiner Rhyme and presents today for routine follow-up.   Patient was referred to Dr. Gardiner Rhyme in 12/2019 for further evaluation of chest pain. Echo and coronary CTA were ordered for further evaluation. Echo showed LVEF of 60-65% with normal wall motion, mild LVH, grade 1 diastolic dysfunction, and mild AI. Coronary CTA showed coronary calcium score of 0 with no evidence of CAD as well as a dilated pulmonary artery and dilated ascending aorta measuring 55mm. Patient was seen by Dr. Gardiner Rhyme for follow-up in 03/2020 at which time he was doing well with no recurrent chest pain.  Patient presents today for routine follow-up. Patient doing well since last visit. He denies any cardiac symptoms. No chest pain, shortness of breath, orthopnea, PND, lower extremity edema, palpitations, lightheadedness, dizziness, or syncope. He has some "pins and needles" sensation in his left leg from sciatica but it has improved a lot since working with PT.  Of note, patient has a history of sleep apnea but is not on CPAP/BiPAP. He does use a mouth guard. He reports that he still snores and still feels very tired when he wakes up in the morning.  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   lisinopril (ZESTRIL) 40 MG tablet Take 1 tablet (40 mg total) by mouth daily.   Melatonin 5 MG TABS Take 5 mg by mouth at bedtime.   Multiple Vitamin (MULTIVITAMIN) tablet Take 1 tablet by mouth daily.   QUEtiapine (SEROQUEL) 100 MG tablet Take 1 tablet (100 mg total) by mouth at bedtime. (Patient taking differently: Take 100 mg by mouth at bedtime. Takes 25 mg)   rosuvastatin (CRESTOR) 10 MG tablet TAKE 1 TABLET BY MOUTH EVERY DAY   SYMBICORT 80-4.5 MCG/ACT inhaler TAKE 2 PUFFS BY  MOUTH TWICE A DAY   tamsulosin (FLOMAX) 0.4 MG CAPS capsule TAKE 1 CAPSULE BY MOUTH DAILY AT NIGHT.   [START ON 07/02/2021] terbinafine (LAMISIL) 250 MG tablet Take 1 tablet (250 mg total) by mouth daily.   traZODone (DESYREL) 50 MG tablet Take 1 tablet (50 mg total) by mouth at bedtime and may repeat dose one time if needed.     Allergies:   Penicillins   Social History   Socioeconomic History   Marital status: Widowed    Spouse name: [name]   Number of children: 1   Years of education: PHD   Highest education level: Not on file  Occupational History    Comment: Professor,[employer] administrator: [employer]  Tobacco Use   Smoking status: Former    Packs/day: 1.00    Years:  40.00    Pack years: 40.00    Types: Cigarettes    Quit date: 01/19/2007    Years since quitting: 14.3   Smokeless tobacco: Never  Vaping Use   Vaping Use: Never used  Substance and Sexual Activity   Alcohol use: Yes    Alcohol/week: 2.0 standard drinks    Types: 2 Cans of beer per week   Drug use: No   Sexual activity: Yes    Birth control/protection: None  Other Topics Concern   Not on file  Social History Narrative   Patient is married ([name]) and lives at home with his wife.   Patient is working full-time.   Patient has a Ph.D   Patient has one adult child.   Patient is right-handed.   Patient drinks two cups of tea daily.   Social Determinants of Health   Financial Resource Strain: Not on file  Food Insecurity: Not on file  Transportation Needs: Not on file  Physical Activity: Not on file  Stress: Not on file  Social Connections: Not on file     Family History: The patient's family history includes Alzheimer's disease in his mother; Aortic aneurysm in his son; Cancer in his father and maternal grandmother; Heart disease in his maternal grandfather and son; Stomach cancer in his father and paternal grandfather. There is no history of Colon cancer, Colon polyps, Esophageal cancer, Rectal cancer, Pancreatic cancer, or Liver cancer.  ROS:   Please see the history of present illness.     EKGs/Labs/Other Studies Reviewed:    The following studies were reviewed today:  Echocardiogram 02/15/2020: Impressions: 1. Left ventricular ejection fraction, by estimation, is 60 to 65%. The  left ventricle has normal function. The left ventricle has no regional  wall motion abnormalities. There is mild concentric left ventricular  hypertrophy. Left ventricular diastolic  parameters are consistent with Grade I diastolic dysfunction (impaired  relaxation).   2. Right ventricular systolic function is normal. The right ventricular  size is normal. There is normal pulmonary artery  systolic pressure. The  estimated right ventricular systolic pressure is 34.1 mmHg.   3. The mitral valve is grossly normal. Trivial mitral valve  regurgitation. No evidence of mitral stenosis.   4. The aortic valve is tricuspid. Aortic valve regurgitation is mild.  Mild aortic valve sclerosis is present, with no evidence of aortic valve  stenosis.   5. The inferior vena cava is normal in size with greater than 50%  respiratory variability, suggesting right atrial pressure of 3 mmHg.  _______________  Coronary CTA 02/20/2020: Impressions: 1. Coronary calcium score of 0. 2. Normal coronary origin  with right dominance. 3. No evidence of CAD. 4. Dilated pulmonary artery measuring up to 62mm in main PA and 58mm in RPA. Moderate RV/RA enlargement 5. Dilated ascending aorta measuring up to 45mm  EKG:  EKG ordered today. EKG personally reviewed and demonstrates normal sinus rhythm, rate 80 bpm, with no acute ST/T changes. Normal axis. Normal PR and QRS intervals. QTc 456 ms.  Recent Labs: 01/31/2021: ALT 21  Recent Lipid Panel    Component Value Date/Time   CHOL 143 03/06/2020 1012   TRIG 125 03/06/2020 1012   HDL 37 (L) 03/06/2020 1012   CHOLHDL 3.9 03/06/2020 1012   CHOLHDL 4.0 04/11/2019 1824   VLDL 23 04/11/2019 1824   LDLCALC 83 03/06/2020 1012   LDLDIRECT 91 02/17/2014 1153    Physical Exam:    Vital Signs: BP 130/85   Pulse 80   Ht 5\' 11"  (1.803 m)   Wt 261 lb 6.4 oz (118.6 kg)   SpO2 96%   BMI 36.46 kg/m     Wt Readings from Last 3 Encounters:  06/05/21 261 lb 6.4 oz (118.6 kg)  05/15/21 265 lb (120.2 kg)  04/03/21 265 lb (120.2 kg)     General: 75 y.o. obese Caucasian male in no acute distress. HEENT: Normocephalic and atraumatic. Sclera clear.  Neck: Supple. No carotid bruits. No JVD. Heart: RRR. Distinct S1 and S2. No murmurs, gallops, or rubs. Radial pulses 2+ and equal bilaterally. Lungs: No increased work of breathing. Clear to ausculation bilaterally. No  wheezes, rhonchi, or rales.  Abdomen: Soft, non-distended, and non-tender to palpation.  MSK: Normal strength and tone for age. Extremities: Trace to mild lower extremity edema.    Skin: Warm and dry. Neuro: Alert and oriented x3. No focal deficits. Psych: Normal affect. Responds appropriately.  Assessment:    1. History of chest pain   2. Essential hypertension   3. Hyperlipidemia, unspecified hyperlipidemia type   4. Obstructive sleep apnea   5. Class 2 obesity without serious comorbidity with body mass index (BMI) of 36.0 to 36.9 in adult, unspecified obesity type     Plan:    History of Chest Pain - Coronary CTA in 01/2020 showed showed calcium score of 0 with no evidence of CAD as well as a dilated pulmonary artery measuring 36 mm and a dilated ascending aorta measuring 37 mm. Echo at this time showed normal biventricular function, grade 1 diastolic dysfunction, and mild AI. - No recurrent chest pain.  Hypertension - BP slightly above goal at 130/85 today. - Continue Lisinopril 40mg  daily.  - Advised patient to monitor BP at home and notify us if BP consistently above 130/80. Recommended increasing physical activity and limiting sodium intake.  Hyperlipidemia - Lipid panel in 03/2020: Total Cholesterol 143, Triglycerides 125, HDL 37, LDL 83.  - Continue Crestor 10mg  daily. - Labs followed by PCP. Scheduled to have labs rechecked in 09/2021 with PCP.  Obstructive Sleep Apnea - Patient does not use CPAP/BiPAP but does use a mouth guard. He has not had a sleep study in several years. He reports snoring and feeling very tired in the morning.  - Recommended repeat sleep study. He would like to wait until the new year. He will send [place] a message when he is ready to schedule this.  Obesity - BMI 36.46. - Recommended increasing physical activity. Goal is 150 minutes of physical activity per week.  - Also discussed importance of heart healthy diet.  Disposition: Follow up in 1 year.  Medication Adjustments/Labs and Tests Ordered: Current medicines are reviewed at length with the patient today.  Concerns regarding medicines are outlined above.  Orders Placed This Encounter  Procedures   EKG 12-Lead   No orders of the defined types were placed in this encounter.   Patient Instructions  Medication Instructions:  The current medical regimen is effective;  continue present plan and medications.  *If you need a refill on your cardiac medications before your next appointment, please call your pharmacy*   Follow-Up: At [facility], you and your health needs are our priority.  As part of our continuing mission to provide you with exceptional heart care, we have created designated Provider Care Teams.  These Care Teams include your primary Cardiologist (physician) and Advanced Practice Providers (APPs -  Physician Assistants and Nurse Practitioners) who all work together to provide you with the care you need, when you need it.  We recommend signing up for the patient portal called "MyChart".  Sign up information is provided on this After Visit Summary.  MyChart is used to connect with patients for Virtual Visits (Telemedicine).  Patients are able to view lab/test results, encounter notes, upcoming appointments, etc.  Non-urgent messages can be sent to your provider as well.   To learn more about what you can do with MyChart, go to [email or web address].    Your next appointment:   12 month(s)  The format for your next appointment:   In Person  Provider:   Oswaldo Milian, MD    Signed, Darreld Mclean, PA-C  06/05/2021 5:05 PM    Gregg White

## 2021-06-04 ENCOUNTER — Ambulatory Visit: Payer: Medicare PPO | Admitting: Podiatry

## 2021-06-04 ENCOUNTER — Other Ambulatory Visit: Payer: Self-pay

## 2021-06-04 ENCOUNTER — Encounter: Payer: Self-pay | Admitting: Student

## 2021-06-04 DIAGNOSIS — B351 Tinea unguium: Secondary | ICD-10-CM

## 2021-06-04 MED ORDER — TERBINAFINE HCL 250 MG PO TABS: 250.0000 mg | ORAL_TABLET | Freq: Every day | ORAL | 0 refills | Status: DC

## 2021-06-04 NOTE — Progress Notes (Signed)
  Subjective:  Patient ID: Gregg Smith, male    DOB: 01-17-1946,  MRN: [id number]  Chief Complaint  Patient presents with   Nail Problem    Thick painful toenails, 3 month follow up    75 y.o. male presents with the above complaint. History confirmed with patient.  Doing well he is taking terbinafine and has had no adverse effects from this  Objective:  Physical Exam: warm, good capillary refill, no trophic changes or ulcerative lesions, normal DP and PT pulses and normal sensory exam.  Right Foot: Right hallux has thickening, dystrophy and subungual debris with yellow discoloration, proximal clearing in the 30 to 40%  [place] pathology results with onychomycosis  Assessment:   1. Onychomycosis      Plan:  Patient was evaluated and treated and all questions answered.  Overall doing well I debrided the nail to rid it of further fungal debris today and recommend we continue the terbinafine.  He will take a 2-week break from medication and then resume and I will see him again in 4 months.  Return in about 4 months (around 10/05/2021) for follow up after nail fungus treatment.

## 2021-06-05 ENCOUNTER — Encounter: Payer: Self-pay | Admitting: Student

## 2021-06-05 ENCOUNTER — Ambulatory Visit: Payer: Medicare PPO | Admitting: Student

## 2021-06-05 VITALS — BP 130/85 | HR 80 | Ht 71.0 in | Wt 261.4 lb

## 2021-06-05 DIAGNOSIS — Z87898 Personal history of other specified conditions: Secondary | ICD-10-CM | POA: Diagnosis not present

## 2021-06-05 DIAGNOSIS — E785 Hyperlipidemia, unspecified: Secondary | ICD-10-CM | POA: Diagnosis not present

## 2021-06-05 DIAGNOSIS — E669 Obesity, unspecified: Secondary | ICD-10-CM

## 2021-06-05 DIAGNOSIS — G4733 Obstructive sleep apnea (adult) (pediatric): Secondary | ICD-10-CM

## 2021-06-05 DIAGNOSIS — I10 Essential (primary) hypertension: Secondary | ICD-10-CM

## 2021-06-05 DIAGNOSIS — Z6836 Body mass index (BMI) 36.0-36.9, adult: Secondary | ICD-10-CM

## 2021-06-05 NOTE — Patient Instructions (Addendum)
Medication Instructions:  The current medical regimen is effective;  continue present plan and medications.  *If you need a refill on your cardiac medications before your next appointment, please call your pharmacy*   Follow-Up: At [facility], you and your health needs are our priority.  As part of our continuing mission to provide you with exceptional heart care, we have created designated Provider Care Teams.  These Care Teams include your primary Cardiologist (physician) and Advanced Practice Providers (APPs -  Physician Assistants and Nurse Practitioners) who all work together to provide you with the care you need, when you need it.  We recommend signing up for the patient portal called "MyChart".  Sign up information is provided on this After Visit Summary.  MyChart is used to connect with patients for Virtual Visits (Telemedicine).  Patients are able to view lab/test results, encounter notes, upcoming appointments, etc.  Non-urgent messages can be sent to your provider as well.   To learn more about what you can do with MyChart, go to [email or web address].    Your next appointment:   12 month(s)  The format for your next appointment:   In Person  Provider:   Oswaldo Milian, MD

## 2021-06-10 ENCOUNTER — Other Ambulatory Visit: Payer: Self-pay | Admitting: Emergency Medicine

## 2021-06-10 DIAGNOSIS — E785 Hyperlipidemia, unspecified: Secondary | ICD-10-CM

## 2021-06-10 DIAGNOSIS — I10 Essential (primary) hypertension: Secondary | ICD-10-CM

## 2021-07-12 ENCOUNTER — Other Ambulatory Visit: Payer: Self-pay | Admitting: Emergency Medicine

## 2021-07-12 DIAGNOSIS — I10 Essential (primary) hypertension: Secondary | ICD-10-CM

## 2021-07-16 ENCOUNTER — Encounter: Payer: Self-pay | Admitting: Emergency Medicine

## 2021-07-16 ENCOUNTER — Other Ambulatory Visit: Payer: Self-pay

## 2021-07-16 DIAGNOSIS — I10 Essential (primary) hypertension: Secondary | ICD-10-CM

## 2021-07-16 MED ORDER — LISINOPRIL 40 MG PO TABS: 40.0000 mg | ORAL_TABLET | Freq: Every day | ORAL | 3 refills | Status: DC

## 2021-09-05 ENCOUNTER — Ambulatory Visit: Payer: Medicare PPO | Admitting: Emergency Medicine

## 2021-09-05 ENCOUNTER — Other Ambulatory Visit: Payer: Self-pay

## 2021-09-05 ENCOUNTER — Encounter: Payer: Self-pay | Admitting: Emergency Medicine

## 2021-09-05 VITALS — BP 148/88 | HR 75 | Temp 98.5°F | Ht 71.0 in | Wt 262.0 lb

## 2021-09-05 DIAGNOSIS — G8929 Other chronic pain: Secondary | ICD-10-CM

## 2021-09-05 DIAGNOSIS — F411 Generalized anxiety disorder: Secondary | ICD-10-CM

## 2021-09-05 DIAGNOSIS — M5136 Other intervertebral disc degeneration, lumbar region: Secondary | ICD-10-CM

## 2021-09-05 DIAGNOSIS — G4733 Obstructive sleep apnea (adult) (pediatric): Secondary | ICD-10-CM | POA: Diagnosis not present

## 2021-09-05 DIAGNOSIS — G4700 Insomnia, unspecified: Secondary | ICD-10-CM

## 2021-09-05 DIAGNOSIS — I10 Essential (primary) hypertension: Secondary | ICD-10-CM

## 2021-09-05 DIAGNOSIS — E785 Hyperlipidemia, unspecified: Secondary | ICD-10-CM

## 2021-09-05 DIAGNOSIS — M5442 Lumbago with sciatica, left side: Secondary | ICD-10-CM

## 2021-09-05 LAB — CBC WITH DIFFERENTIAL/PLATELET
Basophils Absolute: 0.1 10*3/uL (ref 0.0–0.1)
Basophils Relative: 0.8 % (ref 0.0–3.0)
Eosinophils Absolute: 0.2 10*3/uL (ref 0.0–0.7)
Eosinophils Relative: 2.6 % (ref 0.0–5.0)
HCT: 43.4 % (ref 39.0–52.0)
Hemoglobin: 14.5 g/dL (ref 13.0–17.0)
Lymphocytes Relative: 11.8 % — ABNORMAL LOW (ref 12.0–46.0)
Lymphs Abs: 0.8 10*3/uL (ref 0.7–4.0)
MCHC: 33.4 g/dL (ref 30.0–36.0)
MCV: 85.3 fl (ref 78.0–100.0)
Monocytes Absolute: 0.9 10*3/uL (ref 0.1–1.0)
Monocytes Relative: 12.9 % — ABNORMAL HIGH (ref 3.0–12.0)
Neutro Abs: 4.9 10*3/uL (ref 1.4–7.7)
Neutrophils Relative %: 71.9 % (ref 43.0–77.0)
Platelets: 185 10*3/uL (ref 150.0–400.0)
RBC: 5.09 Mil/uL (ref 4.22–5.81)
RDW: 14.9 % (ref 11.5–15.5)
WBC: 6.7 10*3/uL (ref 4.0–10.5)

## 2021-09-05 LAB — LIPID PANEL
Cholesterol: 123 mg/dL (ref 0–200)
HDL: 35.9 mg/dL — ABNORMAL LOW (ref >39.00)
LDL Cholesterol: 66 mg/dL (ref 0–99)
NonHDL: 87.24
Total CHOL/HDL Ratio: 3
Triglycerides: 108 mg/dL (ref 0.0–149.0)
VLDL: 21.6 mg/dL (ref 0.0–40.0)

## 2021-09-05 LAB — COMPREHENSIVE METABOLIC PANEL
ALT: 35 U/L (ref 0–53)
AST: 42 U/L — ABNORMAL HIGH (ref 0–37)
Albumin: 3.8 g/dL (ref 3.5–5.2)
Alkaline Phosphatase: 73 U/L (ref 39–117)
BUN: 18 mg/dL (ref 6–23)
CO2: 29 mEq/L (ref 19–32)
Calcium: 9 mg/dL (ref 8.4–10.5)
Chloride: 105 mEq/L (ref 96–112)
Creatinine, Ser: 1.08 mg/dL (ref 0.40–1.50)
GFR: 67.32 mL/min (ref >60.00)
Glucose, Bld: 84 mg/dL (ref 70–99)
Potassium: 4 mEq/L (ref 3.5–5.1)
Sodium: 139 mEq/L (ref 135–145)
Total Bilirubin: 0.6 mg/dL (ref 0.2–1.2)
Total Protein: 7.1 g/dL (ref 6.0–8.3)

## 2021-09-05 MED ORDER — VALSARTAN-HYDROCHLOROTHIAZIDE 80-12.5 MG PO TABS: 1.0000 | ORAL_TABLET | Freq: Every day | ORAL | 3 refills | Status: DC

## 2021-09-05 NOTE — Assessment & Plan Note (Addendum)
Stable.  Diet and nutrition discussed.  Continue rosuvastatin 10 mg daily. The 10-year ASCVD risk score (Arnett DK, et al., 2019) is: 34.8%   Values used to calculate the score:     Age: 76 years     Sex: Male     Is Non-Hispanic African American: No     Diabetic: No     Tobacco smoker: No     Systolic Blood Pressure: 219 mmHg     Is BP treated: Yes     HDL Cholesterol: 37 mg/dL     Total Cholesterol: 143 mg/dL

## 2021-09-05 NOTE — Assessment & Plan Note (Signed)
Stable.  On CPAP treatment. 

## 2021-09-05 NOTE — Progress Notes (Signed)
Gregg Smith 76 y.o.   Chief Complaint  Patient presents with   Hypertension    6 MONTH F /U    HISTORY OF PRESENT ILLNESS: This is a 76 y.o. male here for hypertension follow-up. Presently on lisinopril 40 mg daily. Has no complaints or medical concerns. Has history of insomnia, on several medications. Sees therapist on a regular basis every 6 weeks for mental health follow-ups. Doing well. BP Readings from Last 3 Encounters:  09/05/21 (!) 148/88  06/05/21 130/85  05/15/21 (!) 162/88     Hypertension Pertinent negatives include no chest pain, headaches, palpitations or shortness of breath.    Prior to Admission medications   Medication Sig Start Date End Date Taking? Authorizing Provider  CLINPRO 5000 1.1 % PSTE Place onto teeth daily. 03/23/20  Yes [provider]  lansoprazole (PREVACID) 30 MG capsule Take 1 capsule (30 mg total) by mouth 2 (two) times daily before a meal. 04/12/21  Yes Irene Shipper, MD  lisinopril (ZESTRIL) 40 MG tablet Take 1 tablet (40 mg total) by mouth daily. 07/16/21  Yes Ulice Follett, Ines Bloomer, MD  Melatonin 5 MG TABS Take 5 mg by mouth at bedtime. 04/12/19  Yes [provider]  Multiple Vitamin (MULTIVITAMIN) tablet Take 1 tablet by mouth daily.   Yes [provider]  QUEtiapine (SEROQUEL) 100 MG tablet Take 1 tablet (100 mg total) by mouth at bedtime. Patient taking differently: Take 100 mg by mouth at bedtime. Takes 25 mg 04/16/19  Yes Derrill Center, NP  rosuvastatin (CRESTOR) 10 MG tablet TAKE 1 TABLET BY MOUTH EVERY DAY 06/10/21  Yes Halston Kintz, Ines Bloomer, MD  SYMBICORT 80-4.5 MCG/ACT inhaler TAKE 2 PUFFS BY MOUTH TWICE A DAY 09/14/20  Yes Raivyn Kabler, Ines Bloomer, MD  tamsulosin (FLOMAX) 0.4 MG CAPS capsule TAKE 1 CAPSULE BY MOUTH DAILY AT NIGHT. 03/05/21  Yes Daylen Lipsky, Ines Bloomer, MD  terbinafine (LAMISIL) 250 MG tablet Take 1 tablet (250 mg total) by mouth daily. 07/02/21  Yes McDonald, Stephan Minister, DPM  traZODone  (DESYREL) 50 MG tablet Take 1 tablet (50 mg total) by mouth at bedtime and may repeat dose one time if needed. 04/16/19  Yes Derrill Center, NP    Allergies  Allergen Reactions   Penicillins Hives and Rash    Has patient had a PCN reaction causing immediate rash, facial/tongue/throat swelling, SOB or lightheadedness with hypotension:unsure Has patient had a PCN reaction causing severe rash involving mucus membranes or skin necrosis:unsure Has patient had a PCN reaction that required hospitalization:No Has patient had a PCN reaction occurring within the last 10 years:No If all of the above answers are "NO", then may proceed with Cephalosporin use. Childhood reaction     Patient Active Problem List   Diagnosis Date Noted   Chronic left-sided low back pain with left-sided sciatica 03/05/2021   Osteoarthritis of knee 01/31/2021   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   Scoliosis deformity of spine 04/22/2019   Generalized anxiety disorder    Panic disorder    Insomnia    Spinal stenosis of lumbar region 11/30/2018   Dental plaque 11/02/2018   Lumbar radiculopathy 10/06/2018   Sciatica 09/01/2018   GERD (gastroesophageal reflux disease) 08/14/2017   S/P left TKA 08/11/2016   S/P knee replacement 08/11/2016   OSA (obstructive sleep apnea) 03/22/2014   Snoring disorder 05/09/2013   Essential hypertension 02/11/2013   Other and unspecified hyperlipidemia 02/11/2013   Arthritis 02/11/2013    Past Medical History:  Diagnosis  Date   Allergy    seasonal    Anxiety    Arthritis    Barrett's esophagus    Cataract    Chicken pox    Colon polyps    hyperplastic   Diverticulosis    GERD (gastroesophageal reflux disease)    Hemorrhoids    Hyperlipidemia    Hypertension    Leg cramps    Measles    Mumps    Obstructive sleep apnea    Pneumonia    Sleep apnea    dental device    Past Surgical History:  Procedure Laterality Date   APPENDECTOMY  1957    CATARACT EXTRACTION     COLONOSCOPY     JOINT REPLACEMENT     TONSILLECTOMY     TONSILLECTOMY AND ADENOIDECTOMY  1954   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   Procedure: LEFT TOTAL KNEE ARTHROPLASTY;  Surgeon: Paralee Cancel, MD;  Location: [facility];  Service: Orthopedics;  Laterality: Left;   UPPER GASTROINTESTINAL ENDOSCOPY      Social History   Socioeconomic History   Marital status: Widowed    Spouse name: [name]   Number of children: 1   Years of education: PHD   Highest education level: Not on file  Occupational History    Comment: Professor,[employer] Education administrator: [employer]  Tobacco Use   Smoking status: Former    Packs/day: 1.00    Years: 40.00    Pack years: 40.00    Types: Cigarettes    Quit date: 01/19/2007    Years since quitting: 14.6   Smokeless tobacco: Never  Vaping Use   Vaping Use: Never used  Substance and Sexual Activity   Alcohol use: Yes    Alcohol/week: 2.0 standard drinks    Types: 2 Cans of beer per week   Drug use: No   Sexual activity: Yes    Birth control/protection: None  Other Topics Concern   Not on file  Social History Narrative   Patient is married ([name]) and lives at home with his wife.   Patient is working full-time.   Patient has a Ph.D   Patient has one adult child.   Patient is right-handed.   Patient drinks two cups of tea daily.   Social Determinants of Health   Financial Resource Strain: Not on file  Food Insecurity: Not on file  Transportation Needs: Not on file  Physical Activity: Not on file  Stress: Not on file  Social Connections: Not on file  Intimate Partner Violence: Not on file    Family History  Problem Relation Age of Onset   Alzheimer's disease Mother    Cancer Father    Stomach cancer Father    Aortic aneurysm Son    Heart disease Son    Cancer Maternal Grandmother    Heart disease Maternal Grandfather    Stomach cancer Paternal Grandfather    Colon cancer Neg Hx    Colon  polyps Neg Hx    Esophageal cancer Neg Hx    Rectal cancer Neg Hx    Pancreatic cancer Neg Hx    Liver cancer Neg Hx      Review of Systems  Constitutional: Negative.  Negative for chills and fever.  HENT: Negative.  Negative for congestion and sore throat.   Respiratory: Negative.  Negative for cough and shortness of breath.   Cardiovascular: Negative.  Negative for chest pain and palpitations.  Gastrointestinal: Negative.  Negative  for abdominal pain, blood in stool, diarrhea, melena, nausea and vomiting.  Genitourinary: Negative.  Negative for dysuria and hematuria.  Musculoskeletal: Negative.  Negative for joint pain and myalgias.  Skin: Negative.  Negative for rash.  Neurological: Negative.  Negative for dizziness and headaches.  All other systems reviewed and are negative.   Physical Exam Vitals reviewed.  Constitutional:      Appearance: Normal appearance.  HENT:     Head: Normocephalic.  Eyes:     Extraocular Movements: Extraocular movements intact.     Conjunctiva/sclera: Conjunctivae normal.     Pupils: Pupils are equal, round, and reactive to light.  Cardiovascular:     Rate and Rhythm: Normal rate and regular rhythm.     Pulses: Normal pulses.     Heart sounds: Normal heart sounds.  Pulmonary:     Effort: Pulmonary effort is normal.     Breath sounds: Normal breath sounds.  Abdominal:     Palpations: Abdomen is soft.     Tenderness: There is no abdominal tenderness.  Musculoskeletal:     Cervical back: Normal range of motion and neck supple.  Skin:    General: Skin is warm and dry.     Capillary Refill: Capillary refill takes less than 2 seconds.  Neurological:     General: No focal deficit present.     Mental Status: He is alert and oriented to person, place, and time.  Psychiatric:        Mood and Affect: Mood normal.        Behavior: Behavior normal.     ASSESSMENT & PLAN: Problem List Items Addressed This Visit       Cardiovascular and  Mediastinum   Essential hypertension - Primary    Uncontrolled hypertension.  Stop lisinopril and start valsartan-HCTZ 80-12.5 mg daily.  Dietary approaches to stop hypertension discussed. Advised to monitor blood pressure readings at home daily for the next couple of weeks and keep a log. Follow-up in 3 months.      Relevant Medications   valsartan-hydrochlorothiazide (DIOVAN-HCT) 80-12.5 MG tablet   Other Relevant Orders   Comprehensive metabolic panel   Lipid panel   CBC with Differential/Platelet     Respiratory   OSA (obstructive sleep apnea)    Stable.  On CPAP treatment.        Nervous and Auditory   Chronic left-sided low back pain with left-sided sciatica    Stable.  Asymptomatic at present time.  Well-controlled.        Musculoskeletal and Integument   Degeneration of lumbar intervertebral disc     Other   Dyslipidemia    Stable.  Diet and nutrition discussed.  Continue rosuvastatin 10 mg daily. The 10-year ASCVD risk score (Arnett DK, et al., 2019) is: 34.8%   Values used to calculate the score:     Age: 24 years     Sex: Male     Is Non-Hispanic African American: No     Diabetic: No     Tobacco smoker: No     Systolic Blood Pressure: 093 mmHg     Is BP treated: Yes     HDL Cholesterol: 37 mg/dL     Total Cholesterol: 143 mg/dL       Generalized anxiety disorder    Stable.  Sees therapist every 6 weeks.  On medications.  Well-controlled.      Insomnia    Stable on medications.  Well-controlled.  Continue Seroquel and trazodone.  Other Visit Diagnoses     Uncontrolled hypertension       Relevant Medications   valsartan-hydrochlorothiazide (DIOVAN-HCT) 80-12.5 MG tablet      Patient Instructions  Hypertension, Adult High blood pressure (hypertension) is when the force of blood pumping through the arteries is too strong. The arteries are the blood vessels that carry blood from the heart throughout the body. Hypertension forces the heart to  work harder to pump blood and may cause arteries to become narrow or stiff. Untreated or uncontrolled hypertension can cause a heart attack, heart failure, a stroke, kidney disease, and other problems. A blood pressure reading consists of a higher number over a lower number. Ideally, your blood pressure should be below 120/80. The first ("top") number is called the systolic pressure. It is a measure of the pressure in your arteries as your heart beats. The second ("bottom") number is called the diastolic pressure. It is a measure of the pressure in your arteries as the heart relaxes. What are the causes? The exact cause of this condition is not known. There are some conditions that result in or are related to high blood pressure. What increases the risk? Some risk factors for high blood pressure are under your control. The following factors may make you more likely to develop this condition: Smoking. Having type 2 diabetes mellitus, high cholesterol, or both. Not getting enough exercise or physical activity. Being overweight. Having too much fat, sugar, calories, or salt (sodium) in your diet. Drinking too much alcohol. Some risk factors for high blood pressure may be difficult or impossible to change. Some of these factors include: Having chronic kidney disease. Having a family history of high blood pressure. Age. Risk increases with age. Race. You may be at higher risk if you are African American. Gender. Men are at higher risk than women before age 76. After age 51, women are at higher risk than men. Having obstructive sleep apnea. Stress. What are the signs or symptoms? High blood pressure may not cause symptoms. Very high blood pressure (hypertensive crisis) may cause: Headache. Anxiety. Shortness of breath. Nosebleed. Nausea and vomiting. Vision changes. Severe chest pain. Seizures. How is this diagnosed? This condition is diagnosed by measuring your blood pressure while you are  seated, with your arm resting on a flat surface, your legs uncrossed, and your feet flat on the floor. The cuff of the blood pressure monitor will be placed directly against the skin of your upper arm at the level of your heart. It should be measured at least twice using the same arm. Certain conditions can cause a difference in blood pressure between your right and left arms. Certain factors can cause blood pressure readings to be lower or higher than normal for a short period of time: When your blood pressure is higher when you are in a health care provider's office than when you are at home, this is called white coat hypertension. Most people with this condition do not need medicines. When your blood pressure is higher at home than when you are in a health care provider's office, this is called masked hypertension. Most people with this condition may need medicines to control blood pressure. If you have a high blood pressure reading during one visit or you have normal blood pressure with other risk factors, you may be asked to: Return on a different day to have your blood pressure checked again. Monitor your blood pressure at home for 1 week or longer. If you are diagnosed  with hypertension, you may have other blood or imaging tests to help your health care provider understand your overall risk for other conditions. How is this treated? This condition is treated by making healthy lifestyle changes, such as eating healthy foods, exercising more, and reducing your alcohol intake. Your health care provider may prescribe medicine if lifestyle changes are not enough to get your blood pressure under control, and if: Your systolic blood pressure is above 130. Your diastolic blood pressure is above 80. Your personal target blood pressure may vary depending on your medical conditions, your age, and other factors. Follow these instructions at home: Eating and drinking  Eat a diet that is high in fiber and  potassium, and low in sodium, added sugar, and fat. An example eating plan is called the DASH (Dietary Approaches to Stop Hypertension) diet. To eat this way: Eat plenty of fresh fruits and vegetables. Try to fill one half of your plate at each meal with fruits and vegetables. Eat whole grains, such as whole-wheat pasta, brown rice, or whole-grain bread. Fill about one fourth of your plate with whole grains. Eat or drink low-fat dairy products, such as skim milk or low-fat yogurt. Avoid fatty cuts of meat, processed or cured meats, and poultry with skin. Fill about one fourth of your plate with lean proteins, such as fish, chicken without skin, beans, eggs, or tofu. Avoid pre-made and processed foods. These tend to be higher in sodium, added sugar, and fat. Reduce your daily sodium intake. Most people with hypertension should eat less than 1,500 mg of sodium a day. Do not drink alcohol if: Your health care provider tells you not to drink. You are pregnant, may be pregnant, or are planning to become pregnant. If you drink alcohol: Limit how much you use to: 0-1 drink a day for women. 0-2 drinks a day for men. Be aware of how much alcohol is in your drink. In the [place] one drink equals one 12 oz bottle of beer (355 mL), one 5 oz glass of wine (148 mL), or one 1 oz glass of hard liquor (44 mL). Lifestyle  Work with your health care provider to maintain a healthy body weight or to lose weight. Ask what an ideal weight is for you. Get at least 30 minutes of exercise most days of the week. Activities may include walking, swimming, or biking. Include exercise to strengthen your muscles (resistance exercise), such as Pilates or lifting weights, as part of your weekly exercise routine. Try to do these types of exercises for 30 minutes at least 3 days a week. Do not use any products that contain nicotine or tobacco, such as cigarettes, e-cigarettes, and chewing tobacco. If you need help quitting, ask your  health care provider. Monitor your blood pressure at home as told by your health care provider. Keep all follow-up visits as told by your health care provider. This is important. Medicines Take over-the-counter and prescription medicines only as told by your health care provider. Follow directions carefully. Blood pressure medicines must be taken as prescribed. Do not skip doses of blood pressure medicine. Doing this puts you at risk for problems and can make the medicine less effective. Ask your health care provider about side effects or reactions to medicines that you should watch for. Contact a health care provider if you: Think you are having a reaction to a medicine you are taking. Have headaches that keep coming back (recurring). Feel dizzy. Have swelling in your ankles. Have trouble with  your vision. Get help right away if you: Develop a severe headache or confusion. Have unusual weakness or numbness. Feel faint. Have severe pain in your chest or abdomen. Vomit repeatedly. Have trouble breathing. Summary Hypertension is when the force of blood pumping through your arteries is too strong. If this condition is not controlled, it may put you at risk for serious complications. Your personal target blood pressure may vary depending on your medical conditions, your age, and other factors. For most people, a normal blood pressure is less than 120/80. Hypertension is treated with lifestyle changes, medicines, or a combination of both. Lifestyle changes include losing weight, eating a healthy, low-sodium diet, exercising more, and limiting alcohol. This information is not intended to replace advice given to you by your health care provider. Make sure you discuss any questions you have with your health care provider. Document Revised: 04/28/2018 Document Reviewed: 04/28/2018 Elsevier Patient Education  2022 [place], MD [facility]

## 2021-09-05 NOTE — Patient Instructions (Signed)

## 2021-09-05 NOTE — Assessment & Plan Note (Signed)
Uncontrolled hypertension.  Stop lisinopril and start valsartan-HCTZ 80-12.5 mg daily.  Dietary approaches to stop hypertension discussed. Advised to monitor blood pressure readings at home daily for the next couple of weeks and keep a log. Follow-up in 3 months.

## 2021-09-05 NOTE — Assessment & Plan Note (Addendum)
Stable on medications.  Well-controlled.  Continue Seroquel and trazodone.

## 2021-09-05 NOTE — Assessment & Plan Note (Signed)
Stable.  Sees therapist every 6 weeks.  On medications.  Well-controlled.

## 2021-09-05 NOTE — Assessment & Plan Note (Signed)
Stable.  Asymptomatic at present time.  Well-controlled.

## 2021-10-06 ENCOUNTER — Other Ambulatory Visit: Payer: Self-pay | Admitting: Internal Medicine

## 2021-10-06 DIAGNOSIS — K219 Gastro-esophageal reflux disease without esophagitis: Secondary | ICD-10-CM

## 2021-10-06 DIAGNOSIS — K2270 Barrett's esophagus without dysplasia: Secondary | ICD-10-CM

## 2021-10-08 ENCOUNTER — Encounter: Payer: Self-pay | Admitting: Podiatry

## 2021-10-08 ENCOUNTER — Ambulatory Visit: Payer: Medicare PPO | Admitting: Podiatry

## 2021-10-08 ENCOUNTER — Other Ambulatory Visit: Payer: Self-pay

## 2021-10-08 DIAGNOSIS — B351 Tinea unguium: Secondary | ICD-10-CM

## 2021-10-08 MED ORDER — TERBINAFINE HCL 250 MG PO TABS: 250.0000 mg | ORAL_TABLET | Freq: Every day | ORAL | 0 refills | Status: DC

## 2021-10-08 NOTE — Progress Notes (Signed)
°  Subjective:  Patient ID: Gregg Smith, male    DOB: [birth year],  MRN: [id number]  Chief Complaint  Patient presents with   Nail Problem      for follow up after nail fungus treatment    76 y.o. male presents with the above complaint. History confirmed with patient.  Doing well he is taking terbinafine and has had no adverse effects from this  Objective:  Physical Exam: warm, good capillary refill, no trophic changes or ulcerative lesions, normal DP and PT pulses and normal sensory exam.  Right Foot: Right hallux has thickening, dystrophy and subungual debris with yellow discoloration, proximal clearing in the 30 to 40%  [facility] pathology results with onychomycosis       Assessment:   1. Onychomycosis      Plan:  Patient was evaluated and treated and all questions answered.  Overall doing well I debrided the nail to rid it of further fungal debris today and recommend we continue the terbinafine.  Refill sent to pharmacy  Return in about 4 months (around 02/05/2022) for follow up after nail fungus treatment.

## 2021-10-23 ENCOUNTER — Encounter: Payer: Self-pay | Admitting: Neurology

## 2021-10-23 ENCOUNTER — Ambulatory Visit: Payer: Medicare PPO | Admitting: Neurology

## 2021-10-23 VITALS — BP 141/83 | HR 80 | Ht 71.0 in | Wt 259.5 lb

## 2021-10-23 DIAGNOSIS — R0683 Snoring: Secondary | ICD-10-CM

## 2021-10-23 DIAGNOSIS — G4733 Obstructive sleep apnea (adult) (pediatric): Secondary | ICD-10-CM

## 2021-10-23 DIAGNOSIS — G4700 Insomnia, unspecified: Secondary | ICD-10-CM | POA: Insufficient documentation

## 2021-10-23 NOTE — Patient Instructions (Signed)

## 2021-10-23 NOTE — Progress Notes (Signed)
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  10-23-2021: Chief concern according to patient :  Paper referral from Gregg Smith DDS for re eval for OSA. Pt would like to get a new oral appliance.  He lost his wife suddenly to a CVA-  in 08-2018 , she was the main Programmer, systems of the household, he felt completely lost-  and next in 10-2018 he developed sciatica, the pandemic made care difficult, mental and physical medicine. He used pain killers and by June/ July stopped the medication: and couldn't sleep anymore. He was hospitalized at [facility], floridly manic. His son moved in for the last few months as he feels very lonely now.  He is well controlled by dental device as confirmed by dr Ron Parker did HST-the patient's home sleep test from 11/24/2014 showed an AHI of 3.2 after treatment with dental device.  The AHI was significantly higher when he slept on his back then when he would sleep on his side.  No central apneas emerging.  Our original sleep study to which she was referred by Freeman Caldron, PA was from 10-9 2019 and confirmed the presence of mild apnea AHI of 8 RDI of 18.8, 30 minutes of desaturation time, no PLM arousals.  The patient was titrated to CPAP mainly to control out her snoring.  8 cm water was effective.    Gregg Smith  has a past medical history of Allergy, Anxiety, Arthritis, Barrett's esophagus, Cataract, Chicken pox, Colon polyps, Diverticulosis, GERD (gastroesophageal reflux  disease), Hemorrhoids, Hyperlipidemia, Hypertension, Leg cramps, Measles, Mumps, Obstructive sleep apnea, Pneumonia, and Sleep apnea.   The patient had the first sleep study in the year 2014- at [facility]-  with a result of an AHI ( Apnea Hypopnea index)  of 8, a RDI ( Respiratory Disturbance Index) of 14, an oxygen saturation Nadir at SP02 88%.    Dr. Perrin Maltese that the appliance is now no longer repairable and that he has to make a new appliance to accommodate the patient's dental work changes.  So what we will do here is to establish as a new apnea index and we can do that by a home sleep test.  I can offer an in lab sleep study if the patient prefers.  Family medical /sleep history: no other family member on CPAP with OSA.  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   Patient is retired and was a Doctor, general practice, still works at home- son is a Radiographer, therapeutic and works at [employer], Print production planner, symphony.  Family status is widowed , with only child, a son. No grandchildren.  The patient currently works part time. Tobacco use; none   ETOH use ; haven't been drinking more than 2 a week, Caffeine intake in form of Coffee( 1 cup) or Tea ( 1  cup) no energy drinks. Regular exercise: none .    Sleep habits are as follows:He goes out for lunch every day, The patient's dinner time is between 5-7 PM. Frozen meals. The patient goes to bed at 11.30 PM , spends the evening in front of a screen.hot shower before bedtime.  and continues to sleep for 6-7 hours, wakes for one bathroom break.    The preferred sleep position is lateral, with the support of 2 pillows. Dreams are reportedly frequent/vivid.  7.30-8   AM is the usual rise time. The patient wakes up spontaneously/.  He feels groggy- reports not feeling refreshed or restored in AM, with symptoms such as dry mouth, morning headaches, and residual fatigue.  Naps are taken very infrequently,  affecting nocturnal sleep.    Review of Systems: Out of a complete 14 system review, the patient complains of only the following symptoms, and all other reviewed systems are negative.:   Main problem is need for new dental device abut also need for insomnia treatment.  Fatigue, snoring, fragmented sleep,   Insomnia - severe , sudden onset.    How likely are you to doze in the following situations: 0 = not likely, 1 = slight chance, 2 = moderate chance, 3 = high chance   Sitting and Reading? Watching Television? Sitting inactive in a public place (theater or meeting)? As a passenger in a car for an hour without a break? Lying down in the afternoon when circumstances permit? Sitting and talking to someone? Sitting quietly after lunch without alcohol? In a car, while stopped for a few minutes in traffic?   Total = 6/ 24 points   FSS endorsed at 21/ 63 points.   Social History   Socioeconomic History   Marital status: Widowed    Spouse name: [name]   Number of children: 1   Years of education: PHD   Highest education level: Not on file  Occupational History    Comment: Professor,[employer] Education administrator: [employer]  Tobacco Use   Smoking status: Former    Packs/day: 1.00    Years: 40.00    Pack years: 40.00    Types: Cigarettes    Quit date: 01/19/2007    Years since quitting: 14.7   Smokeless tobacco: Never  Vaping Use   Vaping Use: Never used  Substance and Sexual Activity   Alcohol use: Yes    Alcohol/week: 2.0 standard drinks    Types: 2 Cans of beer per week   Drug use: No   Sexual activity: Yes    Birth control/protection: None  Other Topics Concern   Not on file  Social History Narrative   Patient lives alone in his home   Patient is retired   Patient has a Ph.D   Patient has one adult child.   Patient is right-handed.   Patient drinks 1 cup of tea and 1 cup of coffee daily    Social Determinants of Health   Financial Resource Strain:  Not on file  Food Insecurity: Not on file  Transportation Needs: Not on file  Physical Activity: Not on file  Stress: Not on file  Social Connections: Not on file    Family History  Problem Relation Age of Onset   Alzheimer's disease Mother    Cancer Father    Stomach cancer Father    Aortic aneurysm Son    Heart disease Son    Cancer Maternal Grandmother  Heart disease Maternal Grandfather    Stomach cancer Paternal Grandfather    Colon cancer Neg Hx    Colon polyps Neg Hx    Esophageal cancer Neg Hx    Rectal cancer Neg Hx    Pancreatic cancer Neg Hx    Liver cancer Neg Hx     Past Medical History:  Diagnosis Date   Allergy    seasonal    Anxiety    Arthritis    Barrett's esophagus    Cataract    Chicken pox    Colon polyps    hyperplastic   Diverticulosis    GERD (gastroesophageal reflux disease)    Hemorrhoids    Hyperlipidemia    Hypertension    Leg cramps    Measles    Mumps    Obstructive sleep apnea    Pneumonia    Sleep apnea    dental device    Past Surgical History:  Procedure Laterality Date   APPENDECTOMY  1957   CATARACT EXTRACTION     COLONOSCOPY     JOINT REPLACEMENT     TONSILLECTOMY     TONSILLECTOMY AND ADENOIDECTOMY  1954   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     lansoprazole (PREVACID) 30 MG capsule TAKE 1 CAPSULE (30 MG TOTAL) BY MOUTH 2 (TWO) TIMES DAILY BEFORE A MEAL. 180 capsule 1   Melatonin 5 MG TABS Take 5 mg by mouth at bedtime.     Multiple Vitamin (MULTIVITAMIN) tablet Take 1 tablet by mouth daily.     QUEtiapine (SEROQUEL) 100 MG tablet Take 1 tablet (100 mg total) by mouth at bedtime. (Patient taking  differently: Take 100 mg by mouth at bedtime. Takes 25 mg) 30 tablet 0   rosuvastatin (CRESTOR) 10 MG tablet TAKE 1 TABLET BY MOUTH EVERY DAY 90 tablet 1   SYMBICORT 80-4.5 MCG/ACT inhaler TAKE 2 PUFFS BY MOUTH TWICE A DAY 30.6 each 3   tamsulosin (FLOMAX) 0.4 MG CAPS capsule TAKE 1 CAPSULE BY MOUTH DAILY AT NIGHT. 90 capsule 1   terbinafine (LAMISIL) 250 MG tablet Take 1 tablet (250 mg total) by mouth daily. 90 tablet 0   traZODone (DESYREL) 50 MG tablet Take 1 tablet (50 mg total) by mouth at bedtime and may repeat dose one time if needed. 30 tablet 0   valsartan-hydrochlorothiazide (DIOVAN-HCT) 80-12.5 MG tablet Take 1 tablet by mouth daily. 90 tablet 3   No current facility-administered medications on file prior to visit.    Allergies  Allergen Reactions   Penicillins Hives and Rash    Has patient had a PCN reaction causing immediate rash, facial/tongue/throat swelling, SOB or lightheadedness with hypotension:unsure Has patient had a PCN reaction causing severe rash involving mucus membranes or skin necrosis:unsure Has patient had a PCN reaction that required hospitalization:No Has patient had a PCN reaction occurring within the last 10 years:No If all of the above answers are "NO", then may proceed with Cephalosporin use. Childhood reaction     Physical exam:  Today's Vitals   10/23/21 1105  BP: (!) 141/83  Pulse: 80  Weight: 259 lb 8 oz (117.7 kg)  Height: 5\' 11"  (1.803 m)  Body mass index is 36.19 kg/m.   Wt Readings from Last 3 Encounters:  10/23/21 259 lb 8 oz (117.7 kg)  09/05/21 262 lb (118.8 kg)  06/05/21 261 lb 6.4 oz (118.6 kg)     Ht Readings from Last 3 Encounters:  10/23/21 5\' 11"  (1.803 m)  09/05/21 5\' 11"  (1.803 m)  06/05/21 5\' 11"  (1.803 m)      General: The patient is awake, alert and appears not in acute distress. The patient is well groomed. Head: Normocephalic, atraumatic. Neck is supple. Mallampati 2,  neck circumference:18 inches . Nasal  airflow  patent.  Retrognathia is  seen. facial hair.  Dental status:  Cardiovascular:  Regular rate and cardiac rhythm by pulse,  without distended neck veins. Respiratory: Lungs are clear to auscultation.  Skin:  evidence of severe ankle edema. Trunk: The patient's posture is erect.   Neurologic exam : The patient is awake and alert, oriented to place and time.   Memory subjective described as intact.  Attention span & concentration ability appears normal.  Speech is fluent,  without  dysarthria, dysphonia or aphasia.  Mood and affect are appropriate.   Cranial nerves: no loss of smell or taste reported  Pupils are equal and briskly reactive to light. Funduscopic exam deferred. One cataract removed. .  Extraocular movements in vertical and horizontal planes were intact and without nystagmus. No Diplopia. Visual fields by finger perimetry are intact. Hearing was intact to soft voice and finger rubbing.    Facial sensation intact to fine touch.  Facial motor strength is symmetric and tongue and uvula move midline.  Neck ROM : rotation, tilt and flexion extension were normal for age and shoulder shrug was symmetrical.    Motor exam:  Symmetric bulk, tone and ROM.   Normal tone without cog wheeling, symmetric grip strength .   Coordination: Rapid alternating movements in the fingers/hands were of normal speed.  The Finger-to-nose maneuver was intact without evidence of ataxia, dysmetria or tremor.  Gait and station: Patient could rise unassisted from a seated position, walked without assistive device.  Toe and heel walk were deferred.  Deep tendon reflexes: in the  upper and lower extremities are symmetric and intact.  Babinski response was deferred.      After spending a total time of  45  minutes face to face and additional time for physical and neurologic examination, review of laboratory studies,  personal review of imaging studies, reports and results of other testing and  review of referral information / records as far as provided in visit, I have established the following assessments:  1) Mr. Bowden reports that he had acute onset of insomnia following several physical and mental events. These are above.  Please message here was sent he was on steroids and on appropriate pain medications when the pain of the sciatica receded he stopped both medications and became paradoxically insomniac.  He had also lost his wife 2 months before the sciatica had developed and there was not really a lot of time to grieve the onset of the pandemic also created a much more low in some existence. 2) Dr. Ron Parker referred this patient mainly to have a new home sleep test evaluation and see if he can work with the results for a new sleep dental device.  This does align some who has for a time lift with him recently had noted that his father is snoring loudly even when the device is in place   My Plan  is to proceed with:  1) HST here ,  will order now.  2) continue sleep hygiene , routine.  3) medication management is by behavioral health   I would like to thank Gregg Pollen, MD and Gregg Smith, [place],  [place] [address] for allowing me to meet with and to take care of this pleasant patient.    CC: Rv in 2-3 months. .  Electronically signed by: Larey Seat, MD 10/23/2021 11:20 AM  [facility] Board certified by The [employer] of Sleep Medicine and Diplomate of the [employer] of Sleep Medicine. Board certified In Neurology through the [place], Fellow of the [employer] of Neurology. Medical Director of [facility].

## 2021-12-02 ENCOUNTER — Ambulatory Visit (INDEPENDENT_AMBULATORY_CARE_PROVIDER_SITE_OTHER): Payer: Medicare PPO | Admitting: Neurology

## 2021-12-02 DIAGNOSIS — R0683 Snoring: Secondary | ICD-10-CM

## 2021-12-02 DIAGNOSIS — G4733 Obstructive sleep apnea (adult) (pediatric): Secondary | ICD-10-CM | POA: Diagnosis not present

## 2021-12-02 DIAGNOSIS — G4700 Insomnia, unspecified: Secondary | ICD-10-CM

## 2021-12-04 NOTE — Progress Notes (Signed)
? ?  ?  ?[facility] ?  ?HOME SLEEP TEST REPORT ( by Watch PAT)   ?STUDY DATE:  12-04-2021 ? ?  ?ORDERING CLINICIAN: Larey Seat, MD  ?REFERRING CLINICIAN: Dr Mitchel Honour Oneal Grout, [address] ?[address] ?[place],  [place] [address]  ?  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 ?Paper referral from Oneal Grout DDS for re eval for OSA. Pt would like to get a new oral appliance.  ?He lost his wife suddenly to a CVA in 08-2018 , and she was the main financial and home organizer of the household, he felt completely lost-  and in 10-2018 he developed sciatica, the pandemic made care difficult, he wasn't under mental and physical medicine care. He used opiate pain killers and by June/ July stopped the medication abruptly but now couldn't sleep anymore. He was hospitalized at [facility] health, floridly manic. His son has moved in for the last few months as he feels very lonely now, still grieving. ?  ?  ?Epworth sleepiness score: 6/24. ?  ?BMI: 36.4 kg/m? ?  ?Neck Circumference: 18" ?  ?FINDINGS: ?  ?Sleep Summary: ?  ?Total Recording Time (hours, min):    Total recording time amounted to 8 hours and 4 minutes of which 7 hours and 6 minutes was a total sleep time.  ?Percent REM (%):   18.7%                                   ?  ?Respiratory Indices: ?  ?Calculated pAHI (per hour):   21/h                        ?  ?REM pAHI:    10.6/h                                           ?  ?NREM pAHI:   23.5/h                         ?  ?Positional AHI: In supine sleep there is clearly a dominance of apnea with an AHI of 27.3/h , he also slept 108 minutes in prone sleep with an AHI of only 5.6/h. ? ?Snoring.  Present for 72.3% of the total sleep time and reached over 50 dB intermittently.                                               ?  ?Oxygen Saturation Statistics: ?  ?O2 Saturation Range (%):  Between 84 and 99% with a mean saturation of 93%.                                   ?  ?O2 Saturation (minutes) <89%:   1.3 minutes only.      ?  ?Pulse Rate Statistics:       ?  ?Pulse Range: Between 46 and 83 bpm with a mean heart  rate of 62 bpm               ?  ?IMPRESSION:  This HST confirms the presence of moderate severe sleep apnea predominantly in non-REM sleep and in supine position.  No significant hypoxia was noted.  I would certainly recommend to continue with a dental device as the patient has tolerated this treatment well. ?  ?RECOMMENDATION: I will give the patient a prescription for a dental device to be per paired through Dr. Ron Parker again.  There is definitely enough apnea at baseline to justify the step and his apnea is not REM sleep dependent, nor associated with hypoxia. ? ?  ?INTERPRETING PHYSICIAN: ? ? Larey Seat, MD  ? ?Medical Director of [facility].  ? ? ? ? ? ? ? ? ? ? ? ? ? ? ? ? ? ? ? ? ?

## 2021-12-05 ENCOUNTER — Other Ambulatory Visit: Payer: Self-pay | Admitting: Emergency Medicine

## 2021-12-05 DIAGNOSIS — J42 Unspecified chronic bronchitis: Secondary | ICD-10-CM

## 2021-12-09 NOTE — Addendum Note (Signed)
Addended by: Larey Seat on: 12/09/2021 06:40 PM ? ? Modules accepted: Orders ? ?

## 2021-12-09 NOTE — Procedures (Signed)
?  ?[facility] ?  ?HOME SLEEP TEST REPORT ( by Watch PAT)   ?STUDY DATE:  12-04-2021 ? ?  ?ORDERING CLINICIAN: Larey Seat, MD  ?REFERRING CLINICIAN: Dr Mitchel Honour Oneal Grout, [address] ?[address] ?[place],  [place] [address]  ?  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 ?Paper referral from Oneal Grout DDS for re eval for OSA. Pt would like to get a new oral appliance.  ?He lost his wife suddenly to a CVA in 08-2018 , and she was the main financial and home organizer of the household, he felt completely lost-  and in 10-2018 he developed sciatica, the pandemic made care difficult, he wasn't under mental and physical medicine care. He used opiate pain killers and by June/ July stopped the medication abruptly but now couldn't sleep anymore. He was hospitalized at [facility] health, floridly manic. His son has moved in for the last few months as he feels very lonely now, still grieving. ?  ?  ?Epworth sleepiness score: 6/24. ?  ?BMI: 36.4 kg/m? ?  ?Neck Circumference: 18" ?  ?FINDINGS: ?  ?Sleep Summary: ?  ?Total Recording Time (hours, min):    Total recording time amounted to 8 hours and 4 minutes of which 7 hours and 6 minutes was a total sleep time.  ?Percent REM (%):   18.7%                                   ?  ?Respiratory Indices: ?  ?Calculated pAHI (per hour):   21/h                        ?  ?REM pAHI:    10.6/h                                           ?  ?NREM pAHI:   23.5/h                         ?  ?Positional AHI: In supine sleep there is clearly a dominance of apnea with an AHI of 27.3/h , he also slept 108 minutes in prone sleep with an AHI of only 5.6/h. ? ?Snoring.  Present for 72.3% of the total sleep time and reached over 50 dB intermittently.                                               ?  ?Oxygen Saturation Statistics: ?  ?O2 Saturation Range (%): Between 84  and 99% with a mean saturation of 93%.                                   ?  ?O2 Saturation (minutes) <89%:   1.3 minutes only.      ?  ?Pulse Rate Statistics:       ?  ?Pulse Range: Between 46 and 83 bpm with a mean heart rate of 62  bpm               ?  ?IMPRESSION:  This HST confirms the presence of moderate severe sleep apnea predominantly in non-REM sleep and in supine position.  No significant hypoxia was noted.  I would certainly recommend to continue with a dental device as the patient has tolerated this treatment well. ?  ?RECOMMENDATION: I will give the patient a prescription for a dental device to be per paired through Dr. Ron Parker again.  There is definitely enough apnea at baseline to justify the step and his apnea is not REM sleep dependent, nor associated with hypoxia. ? ?  ?INTERPRETING PHYSICIAN: ? ? Larey Seat, MD  ? ?Medical Director of [facility].  ? ? ? ? ? ? ? ? ? ? ? ? ? ? ? ? ? ? ? ? ?

## 2021-12-09 NOTE — Progress Notes (Signed)
IMPRESSION:  This HST confirms the presence of moderate severe sleep apnea predominantly in non-REM sleep and in supine position.  No significant hypoxia was noted.  I would certainly recommend to continue with a dental device as the patient has tolerated this treatment well. ?? ?RECOMMENDATION: I will give the patient a prescription for a dental device to be per paired through Dr. Ron Parker again.  There is definitely enough apnea at baseline to justify the step and his apnea is not REM sleep dependent, nor associated with hypoxia. ??

## 2021-12-10 ENCOUNTER — Telehealth: Payer: Self-pay

## 2021-12-10 NOTE — Telephone Encounter (Signed)
-----   Message from Larey Seat, MD sent at 12/09/2021  6:40 PM EDT ----- ?IMPRESSION:  This HST confirms the presence of moderate severe sleep apnea predominantly in non-REM sleep and in supine position.  No significant hypoxia was noted.  I would certainly recommend to continue with a dental device as the patient has tolerated this treatment well. ?? ?RECOMMENDATION: I will give the patient a prescription for a dental device to be per paired through Dr. Ron Parker again.  There is definitely enough apnea at baseline to justify the step and his apnea is not REM sleep dependent, nor associated with hypoxia. ?? ?

## 2021-12-10 NOTE — Telephone Encounter (Signed)
Patient returned my call. I discussed his sleep study results and recommendations with him. He would like to pursue a referral back to Dr. Kae Heller office for a dental device. Pt verbalized understanding of results. Pt had no questions at this time but was encouraged to call back if questions arise. ? ?

## 2021-12-10 NOTE — Telephone Encounter (Signed)
I called patient to discuss his sleep study results. No answer, left a message asking him to call us back. If patient calls back another day please send to POD 1. ?

## 2021-12-11 DIAGNOSIS — G4700 Insomnia, unspecified: Secondary | ICD-10-CM | POA: Diagnosis not present

## 2021-12-11 DIAGNOSIS — F419 Anxiety disorder, unspecified: Secondary | ICD-10-CM | POA: Diagnosis not present

## 2021-12-16 ENCOUNTER — Ambulatory Visit: Payer: Medicare PPO | Admitting: Emergency Medicine

## 2021-12-16 ENCOUNTER — Encounter: Payer: Self-pay | Admitting: Emergency Medicine

## 2021-12-16 DIAGNOSIS — E785 Hyperlipidemia, unspecified: Secondary | ICD-10-CM | POA: Diagnosis not present

## 2021-12-16 DIAGNOSIS — I10 Essential (primary) hypertension: Secondary | ICD-10-CM | POA: Diagnosis not present

## 2021-12-16 NOTE — Progress Notes (Signed)
Gregg Smith ?76 y.o. ? ? ?Chief Complaint  ?Patient presents with  ? Follow-up  ?  No concerns  ? ? ?HISTORY OF PRESENT ILLNESS: ?This is a 76 y.o. male with history of hypertension here for 32-monthfollow-up. ?Last seen by me last January and started on valsartan-HCTZ 80-12.5 mg ?Doing well.  Normal blood pressure readings at home. ?Blood work 3 months ago normal.  Normal CBC, normal CMP, normal lipid profile. ?No complaints or medical concerns today. ?BP Readings from Last 3 Encounters:  ?12/16/21 116/64  ?10/23/21 (!) 141/83  ?09/05/21 (!) 148/88  ? ? ? ?HPI ? ? ?Prior to Admission medications   ?Medication Sig Start Date End Date Taking? Authorizing Provider  ?CLINPRO 5000 1.1 % PSTE Place onto teeth daily. 03/23/20  Yes [provider]  ?lansoprazole (PREVACID) 30 MG capsule TAKE 1 CAPSULE (30 MG TOTAL) BY MOUTH 2 (TWO) TIMES DAILY BEFORE A MEAL. 10/07/21  Yes PIrene Shipper MD  ?Melatonin 5 MG TABS Take 5 mg by mouth at bedtime. 04/12/19  Yes [provider]  ?Multiple Vitamin (MULTIVITAMIN) tablet Take 1 tablet by mouth daily.   Yes [provider]  ?QUEtiapine (SEROQUEL) 100 MG tablet Take 1 tablet (100 mg total) by mouth at bedtime. ?Patient taking differently: Take 100 mg by mouth at bedtime. Takes 25 mg 04/16/19  Yes LDerrill Center NP  ?rosuvastatin (CRESTOR) 10 MG tablet TAKE 1 TABLET BY MOUTH EVERY DAY 06/10/21  Yes Tuyen Uncapher, MInes Bloomer MD  ?SSan [facility]80-4.5 MCG/ACT inhaler TAKE 2 PUFFS BY MOUTH TWICE A DAY 12/05/21  Yes Arijana Narayan, MInes Bloomer MD  ?tamsulosin (FLOMAX) 0.4 MG CAPS capsule TAKE 1 CAPSULE BY MOUTH DAILY AT NIGHT. 03/05/21  Yes Leilah Polimeni, MInes Bloomer MD  ?terbinafine (LAMISIL) 250 MG tablet Take 1 tablet (250 mg total) by mouth daily. 10/08/21  Yes McDonald, AStephan Minister DPM  ?traZODone (DESYREL) 50 MG tablet Take 1 tablet (50 mg total) by mouth at bedtime and may repeat dose one time if needed. 04/16/19  Yes LDerrill Center NP  ?valsartan-hydrochlorothiazide  (DIOVAN-HCT) 80-12.5 MG tablet Take 1 tablet by mouth daily. 09/05/21  Yes SHorald Pollen MD  ? ? ?Allergies  ?Allergen Reactions  ? Penicillins Hives and Rash  ?  Has patient had a PCN reaction causing immediate rash, facial/tongue/throat swelling, SOB or lightheadedness with hypotension:unsure ?Has patient had a PCN reaction causing severe rash involving mucus membranes or skin necrosis:unsure ?Has patient had a PCN reaction that required hospitalization:No ?Has patient had a PCN reaction occurring within the last 10 years:No ?If all of the above answers are "NO", then may proceed with Cephalosporin use. ?Childhood reaction ?  ? ? ?Patient Active Problem List  ? Diagnosis Date Noted  ? Loud snoring 10/23/2021  ? Acute insomnia 10/23/2021  ? Chronic left-sided low back pain with left-sided sciatica 03/05/2021  ? Osteoarthritis of knee 01/31/2021  ? 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  ? Scoliosis deformity of spine 04/22/2019  ? Generalized anxiety disorder   ? Panic disorder   ? Insomnia   ? Spinal stenosis of lumbar region 11/30/2018  ? Dental plaque 11/02/2018  ? Lumbar radiculopathy 10/06/2018  ? Sciatica 09/01/2018  ? GERD (gastroesophageal reflux disease) 08/14/2017  ? S/P left TKA 08/11/2016  ? S/P knee replacement 08/11/2016  ? OSA (obstructive sleep apnea) 03/22/2014  ? Snoring disorder 05/09/2013  ? Essential hypertension 02/11/2013  ? Dyslipidemia 02/11/2013  ? Arthritis 02/11/2013  ? ? ?Past Medical History:  ?Diagnosis  Date  ? Allergy   ? seasonal   ? Anxiety   ? Arthritis   ? Barrett's esophagus   ? Cataract   ? Chicken pox   ? Colon polyps   ? hyperplastic  ? Diverticulosis   ? GERD (gastroesophageal reflux disease)   ? Hemorrhoids   ? Hyperlipidemia   ? Hypertension   ? Leg cramps   ? Measles   ? Mumps   ? Obstructive sleep apnea   ? Pneumonia   ? Sleep apnea   ? dental device  ? ? ?Past Surgical History:  ?Procedure Laterality Date  ? APPENDECTOMY  1957  ? CATARACT  EXTRACTION    ? COLONOSCOPY    ? JOINT REPLACEMENT    ? TONSILLECTOMY    ? [place]  ? 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  ? Procedure: LEFT TOTAL KNEE ARTHROPLASTY;  Surgeon: Paralee Cancel, MD;  Location: [facility];  Service: Orthopedics;  Laterality: Left;  ? UPPER GASTROINTESTINAL ENDOSCOPY    ? ? ?Social History  ? ?Socioeconomic History  ? Marital status: Widowed  ?  Spouse name: [name]  ? Number of children: 1  ? Years of education: PHD  ? Highest education level: Not on file  ?Occupational History  ?  Comment: Professor,[employer]  ?  Employer: [employer]  ?Tobacco Use  ? Smoking status: Former  ?  Packs/day: 1.00  ?  Years: 40.00  ?  Pack years: 40.00  ?  Types: Cigarettes  ?  Quit date: 01/19/2007  ?  Years since quitting: 14.9  ? Smokeless tobacco: Never  ?Vaping Use  ? Vaping Use: Never used  ?Substance and Sexual Activity  ? Alcohol use: Yes  ?  Alcohol/week: 2.0 standard drinks  ?  Types: 2 Cans of beer per week  ? Drug use: No  ? Sexual activity: Yes  ?  Birth control/protection: None  ?Other Topics Concern  ? Not on file  ?Social History Narrative  ? Patient lives alone in his home  ? Patient is retired  ? Patient has a Ph.D  ? Patient has one adult child.  ? Patient is right-handed.  ? Patient drinks 1 cup of tea and 1 cup of coffee daily   ? ?Social Determinants of Health  ? ?Financial Resource Strain: Not on file  ?Food Insecurity: Not on file  ?Transportation Needs: Not on file  ?Physical Activity: Not on file  ?Stress: Not on file  ?Social Connections: Not on file  ?Intimate Partner Violence: Not on file  ? ? ?Family History  ?Problem Relation Age of Onset  ? Alzheimer's disease Mother   ? Cancer Father   ? Stomach cancer Father   ? Aortic aneurysm Son   ? Heart disease Son   ? Cancer Maternal Grandmother   ? Heart disease Maternal Grandfather   ? Stomach cancer Paternal Grandfather   ? Colon cancer Neg Hx   ? Colon polyps Neg Hx   ? Esophageal cancer  Neg Hx   ? Rectal cancer Neg Hx   ? Pancreatic cancer Neg Hx   ? Liver cancer Neg Hx   ? ? ? ?Review of Systems  ?Constitutional: Negative.  Negative for chills and fever.  ?HENT: Negative.    ?Eyes: Negative.   ?Cardiovascular: Negative.  Negative for chest pain and palpitations.  ?Gastrointestinal:  Negative for abdominal pain, nausea and vomiting.  ?Genitourinary: Negative.   ?Skin: Negative.  Negative  for rash.  ?Psychiatric/Behavioral:  The patient has insomnia.   ?All other systems reviewed and are negative. ? ?Today's Vitals  ? 12/16/21 0902  ?BP: 116/64  ?Pulse: 75  ?Temp: 98.4 ?F (36.9 ?C)  ?SpO2: 93%  ?Weight: 258 lb (117 kg)  ?Height: '5\' 11"'$  (1.803 m)  ? ?Body mass index is 35.98 kg/m?. ? ?Physical Exam ?Vitals reviewed.  ?Constitutional:   ?   Appearance: Normal appearance.  ?HENT:  ?   Head: Normocephalic.  ?Eyes:  ?   Extraocular Movements: Extraocular movements intact.  ?   Pupils: Pupils are equal, round, and reactive to light.  ?Cardiovascular:  ?   Rate and Rhythm: Normal rate and regular rhythm.  ?   Pulses: Normal pulses.  ?   Heart sounds: Normal heart sounds.  ?Pulmonary:  ?   Effort: Pulmonary effort is normal.  ?   Breath sounds: Normal breath sounds.  ?Musculoskeletal:  ?   Cervical back: No tenderness.  ?Lymphadenopathy:  ?   Cervical: No cervical adenopathy.  ?Skin: ?   General: Skin is warm and dry.  ?Neurological:  ?   General: No focal deficit present.  ?   Mental Status: He is alert and oriented to person, place, and time.  ? ? ? ?ASSESSMENT & PLAN: ?Problem List Items Addressed This Visit   ? ?  ? Cardiovascular and Mediastinum  ? Essential hypertension  ?  Well-controlled hypertension.  Continue valsartan-HCTZ 80-12.5 mg daily. ? ? ?  ?  ?  ? Other  ? Dyslipidemia  ?  Stable.  Continue rosuvastatin 10 mg daily. ? ?  ?  ? ?Patient Instructions  ?Hypertension, Adult ?High blood pressure (hypertension) is when the force of blood pumping through the arteries is too strong. The arteries  are the blood vessels that carry blood from the heart throughout the body. Hypertension forces the heart to work harder to pump blood and may cause arteries to become narrow or stiff. Untreated or uncontrolled hypertens

## 2021-12-16 NOTE — Assessment & Plan Note (Signed)
Well-controlled hypertension. Continue valsartan HCTZ 80-12.5 mg daily. 

## 2021-12-16 NOTE — Assessment & Plan Note (Signed)
Stable.  Continue rosuvastatin 10 mg daily. 

## 2021-12-16 NOTE — Patient Instructions (Signed)
Hypertension, Adult High blood pressure (hypertension) is when the force of blood pumping through the arteries is too strong. The arteries are the blood vessels that carry blood from the heart throughout the body. Hypertension forces the heart to work harder to pump blood and may cause arteries to become narrow or stiff. Untreated or uncontrolled hypertension can lead to a heart attack, heart failure, a stroke, kidney disease, and other problems. A blood pressure reading consists of a higher number over a lower number. Ideally, your blood pressure should be below 120/80. The first ("top") number is called the systolic pressure. It is a measure of the pressure in your arteries as your heart beats. The second ("bottom") number is called the diastolic pressure. It is a measure of the pressure in your arteries as the heart relaxes. What are the causes? The exact cause of this condition is not known. There are some conditions that result in high blood pressure. What increases the risk? Certain factors may make you more likely to develop high blood pressure. Some of these risk factors are under your control, including: Smoking. Not getting enough exercise or physical activity. Being overweight. Having too much fat, sugar, calories, or salt (sodium) in your diet. Drinking too much alcohol. Other risk factors include: Having a personal history of heart disease, diabetes, high cholesterol, or kidney disease. Stress. Having a family history of high blood pressure and high cholesterol. Having obstructive sleep apnea. Age. The risk increases with age. What are the signs or symptoms? High blood pressure may not cause symptoms. Very high blood pressure (hypertensive crisis) may cause: Headache. Fast or irregular heartbeats (palpitations). Shortness of breath. Nosebleed. Nausea and vomiting. Vision changes. Severe chest pain, dizziness, and seizures. How is this diagnosed? This condition is diagnosed by  measuring your blood pressure while you are seated, with your arm resting on a flat surface, your legs uncrossed, and your feet flat on the floor. The cuff of the blood pressure monitor will be placed directly against the skin of your upper arm at the level of your heart. Blood pressure should be measured at least twice using the same arm. Certain conditions can cause a difference in blood pressure between your right and left arms. If you have a high blood pressure reading during one visit or you have normal blood pressure with other risk factors, you may be asked to: Return on a different day to have your blood pressure checked again. Monitor your blood pressure at home for 1 week or longer. If you are diagnosed with hypertension, you may have other blood or imaging tests to help your health care provider understand your overall risk for other conditions. How is this treated? This condition is treated by making healthy lifestyle changes, such as eating healthy foods, exercising more, and reducing your alcohol intake. You may be referred for counseling on a healthy diet and physical activity. Your health care provider may prescribe medicine if lifestyle changes are not enough to get your blood pressure under control and if: Your systolic blood pressure is above 130. Your diastolic blood pressure is above 80. Your personal target blood pressure may vary depending on your medical conditions, your age, and other factors. Follow these instructions at home: Eating and drinking  Eat a diet that is high in fiber and potassium, and low in sodium, added sugar, and fat. An example of this eating plan is called the DASH diet. DASH stands for Dietary Approaches to Stop Hypertension. To eat this way: Eat   plenty of fresh fruits and vegetables. Try to fill one half of your plate at each meal with fruits and vegetables. Eat whole grains, such as whole-wheat pasta, brown rice, or whole-grain bread. Fill about one  fourth of your plate with whole grains. Eat or drink low-fat dairy products, such as skim milk or low-fat yogurt. Avoid fatty cuts of meat, processed or cured meats, and poultry with skin. Fill about one fourth of your plate with lean proteins, such as fish, chicken without skin, beans, eggs, or tofu. Avoid pre-made and processed foods. These tend to be higher in sodium, added sugar, and fat. Reduce your daily sodium intake. Many people with hypertension should eat less than 1,500 mg of sodium a day. Do not drink alcohol if: Your health care provider tells you not to drink. You are pregnant, may be pregnant, or are planning to become pregnant. If you drink alcohol: Limit how much you have to: 0-1 drink a day for women. 0-2 drinks a day for men. Know how much alcohol is in your drink. In the [place] one drink equals one 12 oz bottle of beer (355 mL), one 5 oz glass of wine (148 mL), or one 1 oz glass of hard liquor (44 mL). Lifestyle  Work with your health care provider to maintain a healthy body weight or to lose weight. Ask what an ideal weight is for you. Get at least 30 minutes of exercise that causes your heart to beat faster (aerobic exercise) most days of the week. Activities may include walking, swimming, or biking. Include exercise to strengthen your muscles (resistance exercise), such as Pilates or lifting weights, as part of your weekly exercise routine. Try to do these types of exercises for 30 minutes at least 3 days a week. Do not use any products that contain nicotine or tobacco. These products include cigarettes, chewing tobacco, and vaping devices, such as e-cigarettes. If you need help quitting, ask your health care provider. Monitor your blood pressure at home as told by your health care provider. Keep all follow-up visits. This is important. Medicines Take over-the-counter and prescription medicines only as told by your health care provider. Follow directions carefully. Blood  pressure medicines must be taken as prescribed. Do not skip doses of blood pressure medicine. Doing this puts you at risk for problems and can make the medicine less effective. Ask your health care provider about side effects or reactions to medicines that you should watch for. Contact a health care provider if you: Think you are having a reaction to a medicine you are taking. Have headaches that keep coming back (recurring). Feel dizzy. Have swelling in your ankles. Have trouble with your vision. Get help right away if you: Develop a severe headache or confusion. Have unusual weakness or numbness. Feel faint. Have severe pain in your chest or abdomen. Vomit repeatedly. Have trouble breathing. These symptoms may be an emergency. Get help right away. Call 911. Do not wait to see if the symptoms will go away. Do not drive yourself to the hospital. Summary Hypertension is when the force of blood pumping through your arteries is too strong. If this condition is not controlled, it may put you at risk for serious complications. Your personal target blood pressure may vary depending on your medical conditions, your age, and other factors. For most people, a normal blood pressure is less than 120/80. Hypertension is treated with lifestyle changes, medicines, or a combination of both. Lifestyle changes include losing weight, eating a healthy,   low-sodium diet, exercising more, and limiting alcohol. This information is not intended to replace advice given to you by your health care provider. Make sure you discuss any questions you have with your health care provider. Document Revised: 06/25/2021 Document Reviewed: 06/25/2021 Elsevier Patient Education  2023 Elsevier Inc.  

## 2022-01-02 ENCOUNTER — Telehealth: Payer: Self-pay | Admitting: Neurology

## 2022-01-02 NOTE — Telephone Encounter (Signed)
Referral for DENTISTRY sent to Dr. Oneal Grout [phone]. ?

## 2022-01-28 DIAGNOSIS — G4733 Obstructive sleep apnea (adult) (pediatric): Secondary | ICD-10-CM | POA: Diagnosis not present

## 2022-01-29 DIAGNOSIS — H43813 Vitreous degeneration, bilateral: Secondary | ICD-10-CM | POA: Diagnosis not present

## 2022-01-29 DIAGNOSIS — H2511 Age-related nuclear cataract, right eye: Secondary | ICD-10-CM | POA: Diagnosis not present

## 2022-01-29 DIAGNOSIS — H25011 Cortical age-related cataract, right eye: Secondary | ICD-10-CM | POA: Diagnosis not present

## 2022-02-01 ENCOUNTER — Other Ambulatory Visit: Payer: Self-pay | Admitting: Emergency Medicine

## 2022-02-01 DIAGNOSIS — E785 Hyperlipidemia, unspecified: Secondary | ICD-10-CM

## 2022-02-04 ENCOUNTER — Ambulatory Visit: Payer: Medicare PPO | Admitting: Podiatry

## 2022-02-04 DIAGNOSIS — B351 Tinea unguium: Secondary | ICD-10-CM | POA: Diagnosis not present

## 2022-02-04 MED ORDER — FLUCONAZOLE 150 MG PO TABS: 150.0000 mg | ORAL_TABLET | ORAL | 0 refills | Status: DC

## 2022-02-10 NOTE — Progress Notes (Signed)
  Subjective:  Patient ID: SEDERICK JACOBSEN, male    DOB: June 29, 1946,  MRN: [id number]  Chief Complaint  Patient presents with   Nail Problem    follow up after nail fungus treatment    76 y.o. male presents with the above complaint. History confirmed with patient.  He has not had 3 rounds of terbinafine.  Has not had much improvement since last visit  Objective:  Physical Exam: warm, good capillary refill, no trophic changes or ulcerative lesions, normal DP and PT pulses and normal sensory exam.  Right Foot: Right hallux has thickening, dystrophy and subungual debris with yellow discoloration, proximal clearing in the 30 to 40%  [place] pathology results with onychomycosis       Assessment:   1. Onychomycosis      Plan:  Patient was evaluated and treated and all questions answered.  Still seems to be about the same remaining 30 to 40% involvement.  I recommend we switch from terbinafine to pulsed Diflucan dosing to see if this allows for any further progress.  This was sent to his pharmacy and I will see him back in 3 months for follow-up.  The nails were debrided in length and thickness using a sharp nail nipper to a comfortable level.  Return in about 3 months (around 05/07/2022) for follow up after nail fungus treatment.

## 2022-02-13 DIAGNOSIS — H2511 Age-related nuclear cataract, right eye: Secondary | ICD-10-CM | POA: Diagnosis not present

## 2022-02-13 DIAGNOSIS — H25011 Cortical age-related cataract, right eye: Secondary | ICD-10-CM | POA: Diagnosis not present

## 2022-02-13 DIAGNOSIS — H25811 Combined forms of age-related cataract, right eye: Secondary | ICD-10-CM | POA: Diagnosis not present

## 2022-02-13 DIAGNOSIS — H269 Unspecified cataract: Secondary | ICD-10-CM | POA: Diagnosis not present

## 2022-02-18 DIAGNOSIS — G4700 Insomnia, unspecified: Secondary | ICD-10-CM | POA: Diagnosis not present

## 2022-02-18 DIAGNOSIS — F419 Anxiety disorder, unspecified: Secondary | ICD-10-CM | POA: Diagnosis not present

## 2022-03-11 NOTE — Telephone Encounter (Signed)
Received a notification that the patient has been set up on the dental device.

## 2022-03-14 DIAGNOSIS — Z961 Presence of intraocular lens: Secondary | ICD-10-CM | POA: Diagnosis not present

## 2022-03-17 ENCOUNTER — Ambulatory Visit: Payer: Medicare PPO | Admitting: Emergency Medicine

## 2022-03-17 ENCOUNTER — Encounter: Payer: Self-pay | Admitting: Emergency Medicine

## 2022-03-17 VITALS — BP 138/82 | HR 68 | Temp 98.1°F | Ht 71.0 in | Wt 256.4 lb

## 2022-03-17 DIAGNOSIS — F411 Generalized anxiety disorder: Secondary | ICD-10-CM

## 2022-03-17 DIAGNOSIS — K219 Gastro-esophageal reflux disease without esophagitis: Secondary | ICD-10-CM | POA: Diagnosis not present

## 2022-03-17 DIAGNOSIS — E785 Hyperlipidemia, unspecified: Secondary | ICD-10-CM | POA: Diagnosis not present

## 2022-03-17 DIAGNOSIS — R399 Unspecified symptoms and signs involving the genitourinary system: Secondary | ICD-10-CM | POA: Diagnosis not present

## 2022-03-17 DIAGNOSIS — I700 Atherosclerosis of aorta: Secondary | ICD-10-CM | POA: Insufficient documentation

## 2022-03-17 DIAGNOSIS — I10 Essential (primary) hypertension: Secondary | ICD-10-CM | POA: Diagnosis not present

## 2022-03-17 DIAGNOSIS — G4733 Obstructive sleep apnea (adult) (pediatric): Secondary | ICD-10-CM

## 2022-03-17 NOTE — Assessment & Plan Note (Signed)
Well-controlled hypertension. BP Readings from Last 3 Encounters:  03/17/22 138/82  12/16/21 116/64  10/23/21 (!) 141/83  Continue valsartan-HCTZ 80-12.5 mg daily. Cardiovascular risks associated with hypertension discussed. Diet and nutrition discussed. Follow-up in 6 months.

## 2022-03-17 NOTE — Assessment & Plan Note (Signed)
Stable.  Diet and nutrition discussed.  Continue rosuvastatin 10 mg daily.  

## 2022-03-17 NOTE — Assessment & Plan Note (Signed)
Recently retested.  Stable.  On CPAP treatment.

## 2022-03-17 NOTE — Patient Instructions (Signed)
Health Maintenance After Age 76 After age 76, you are at a higher risk for certain long-term diseases and infections as well as injuries from falls. Falls are a major cause of broken bones and head injuries in people who are older than age 76. Getting regular preventive care can help to keep you healthy and well. Preventive care includes getting regular testing and making lifestyle changes as recommended by your health care provider. Talk with your health care provider about: Which screenings and tests you should have. A screening is a test that checks for a disease when you have no symptoms. A diet and exercise plan that is right for you. What should I know about screenings and tests to prevent falls? Screening and testing are the best ways to find a health problem early. Early diagnosis and treatment give you the best chance of managing medical conditions that are common after age 76. Certain conditions and lifestyle choices may make you more likely to have a fall. Your health care provider may recommend: Regular vision checks. Poor vision and conditions such as cataracts can make you more likely to have a fall. If you wear glasses, make sure to get your prescription updated if your vision changes. Medicine review. Work with your health care provider to regularly review all of the medicines you are taking, including over-the-counter medicines. Ask your health care provider about any side effects that may make you more likely to have a fall. Tell your health care provider if any medicines that you take make you feel dizzy or sleepy. Strength and balance checks. Your health care provider may recommend certain tests to check your strength and balance while standing, walking, or changing positions. Foot health exam. Foot pain and numbness, as well as not wearing proper footwear, can make you more likely to have a fall. Screenings, including: Osteoporosis screening. Osteoporosis is a condition that causes  the bones to get weaker and break more easily. Blood pressure screening. Blood pressure changes and medicines to control blood pressure can make you feel dizzy. Depression screening. You may be more likely to have a fall if you have a fear of falling, feel depressed, or feel unable to do activities that you used to do. Alcohol use screening. Using too much alcohol can affect your balance and may make you more likely to have a fall. Follow these instructions at home: Lifestyle Do not drink alcohol if: Your health care provider tells you not to drink. If you drink alcohol: Limit how much you have to: 0-1 drink a day for women. 0-2 drinks a day for men. Know how much alcohol is in your drink. In the [place] one drink equals one 12 oz bottle of beer (355 mL), one 5 oz glass of wine (148 mL), or one 1 oz glass of hard liquor (44 mL). Do not use any products that contain nicotine or tobacco. These products include cigarettes, chewing tobacco, and vaping devices, such as e-cigarettes. If you need help quitting, ask your health care provider. Activity  Follow a regular exercise program to stay fit. This will help you maintain your balance. Ask your health care provider what types of exercise are appropriate for you. If you need a cane or walker, use it as recommended by your health care provider. Wear supportive shoes that have nonskid soles. Safety  Remove any tripping hazards, such as rugs, cords, and clutter. Install safety equipment such as grab bars in bathrooms and safety rails on stairs. Keep rooms and walkways   well-lit. General instructions Talk with your health care provider about your risks for falling. Tell your health care provider if: You fall. Be sure to tell your health care provider about all falls, even ones that seem minor. You feel dizzy, tiredness (fatigue), or off-balance. Take over-the-counter and prescription medicines only as told by your health care provider. These include  supplements. Eat a healthy diet and maintain a healthy weight. A healthy diet includes low-fat dairy products, low-fat (lean) meats, and fiber from whole grains, beans, and lots of fruits and vegetables. Stay current with your vaccines. Schedule regular health, dental, and eye exams. Summary Having a healthy lifestyle and getting preventive care can help to protect your health and wellness after age 76. Screening and testing are the best way to find a health problem early and help you avoid having a fall. Early diagnosis and treatment give you the best chance for managing medical conditions that are more common for people who are older than age 76. Falls are a major cause of broken bones and head injuries in people who are older than age 76. Take precautions to prevent a fall at home. Work with your health care provider to learn what changes you can make to improve your health and wellness and to prevent falls. This information is not intended to replace advice given to you by your health care provider. Make sure you discuss any questions you have with your health care provider. Document Revised: 01/07/2021 Document Reviewed: 01/07/2021 Elsevier Patient Education  2023 Elsevier Inc.  

## 2022-03-17 NOTE — Assessment & Plan Note (Signed)
Stable and well-controlled.  Takes trazodone 50 mg at bedtime.

## 2022-03-17 NOTE — Assessment & Plan Note (Signed)
Stable and well-controlled. Continue Prevacid 30 mg daily.

## 2022-03-17 NOTE — Assessment & Plan Note (Signed)
Continue rosuvastatin 10 mg daily.

## 2022-03-17 NOTE — Assessment & Plan Note (Signed)
Well-controlled on tamsulosin 0.4 mg at bedtime

## 2022-03-17 NOTE — Progress Notes (Signed)
Gregg Smith 76 y.o.   Chief Complaint  Patient presents with   Follow-up    3 mnth f/u appt no concerns     HISTORY OF PRESENT ILLNESS: This is a 76 y.o. male here for 46-monthfollow-up of hypertension and several other chronic medical problems. Doing well.  Has no complaints or medical concerns today. BP Readings from Last 3 Encounters:  03/17/22 138/82  12/16/21 116/64  10/23/21 (!) 141/83     HPI   Prior to Admission medications   Medication Sig Start Date End Date Taking? Authorizing Provider  CLINPRO 5000 1.1 % PSTE Place onto teeth daily. 03/23/20  Yes [provider]  lansoprazole (PREVACID) 30 MG capsule TAKE 1 CAPSULE (30 MG TOTAL) BY MOUTH 2 (TWO) TIMES DAILY BEFORE A MEAL. 10/07/21  Yes PIrene Shipper MD  Melatonin 5 MG TABS Take 5 mg by mouth at bedtime. 04/12/19  Yes [provider]  Multiple Vitamin (MULTIVITAMIN) tablet Take 1 tablet by mouth daily.   Yes [provider]  rosuvastatin (CRESTOR) 10 MG tablet TAKE 1 TABLET BY MOUTH EVERY DAY 02/01/22  Yes Dallan Schonberg, MInes Bloomer MD  SYMBICORT 80-4.5 MCG/ACT inhaler TAKE 2 PUFFS BY MOUTH TWICE A DAY 12/05/21  Yes Shalese Strahan, MInes Bloomer MD  tamsulosin (FLOMAX) 0.4 MG CAPS capsule TAKE 1 CAPSULE BY MOUTH DAILY AT NIGHT. 03/05/21  Yes Jiselle Sheu, MInes Bloomer MD  traZODone (DESYREL) 50 MG tablet Take 1 tablet (50 mg total) by mouth at bedtime and may repeat dose one time if needed. 04/16/19  Yes LDerrill Center NP  valsartan-hydrochlorothiazide (DIOVAN-HCT) 80-12.5 MG tablet Take 1 tablet by mouth daily. 09/05/21  Yes Oluwatamilore Starnes, MInes Bloomer MD  fluconazole (DIFLUCAN) 150 MG tablet Take 1 tablet (150 mg total) by mouth once a week. Patient not taking: Reported on 03/17/2022 02/04/22   MCriselda Peaches DPM  QUEtiapine (SEROQUEL) 100 MG tablet Take 1 tablet (100 mg total) by mouth at bedtime. Patient not taking: Reported on 03/17/2022 04/16/19   LDerrill Center NP    Allergies  Allergen Reactions    Penicillins Hives and Rash    Has patient had a PCN reaction causing immediate rash, facial/tongue/throat swelling, SOB or lightheadedness with hypotension:unsure Has patient had a PCN reaction causing severe rash involving mucus membranes or skin necrosis:unsure Has patient had a PCN reaction that required hospitalization:No Has patient had a PCN reaction occurring within the last 10 years:No If all of the above answers are "NO", then may proceed with Cephalosporin use. Childhood reaction     Patient Active Problem List   Diagnosis Date Noted   Loud snoring 10/23/2021   Acute insomnia 10/23/2021   Chronic left-sided low back pain with left-sided sciatica 03/05/2021   Osteoarthritis of knee 01/31/2021   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   Scoliosis deformity of spine 04/22/2019   Generalized anxiety disorder    Panic disorder    Insomnia    Spinal stenosis of lumbar region 11/30/2018   Dental plaque 11/02/2018   Lumbar radiculopathy 10/06/2018   Sciatica 09/01/2018   GERD (gastroesophageal reflux disease) 08/14/2017   S/P left TKA 08/11/2016   S/P knee replacement 08/11/2016   OSA (obstructive sleep apnea) 03/22/2014   Snoring disorder 05/09/2013   Essential hypertension 02/11/2013   Dyslipidemia 02/11/2013   Arthritis 02/11/2013    Past Medical History:  Diagnosis Date   Allergy    seasonal    Anxiety    Arthritis    Barrett's esophagus  Cataract    Chicken pox    Colon polyps    hyperplastic   Diverticulosis    GERD (gastroesophageal reflux disease)    Hemorrhoids    Hyperlipidemia    Hypertension    Leg cramps    Measles    Mumps    Obstructive sleep apnea    Pneumonia    Sleep apnea    dental device    Past Surgical History:  Procedure Laterality Date   APPENDECTOMY  1957   CATARACT EXTRACTION     COLONOSCOPY     JOINT REPLACEMENT     TONSILLECTOMY     TONSILLECTOMY AND ADENOIDECTOMY  1954   TOTAL KNEE ARTHROPLASTY Left  08/11/2016   Procedure: LEFT TOTAL KNEE ARTHROPLASTY;  Surgeon: Paralee Cancel, MD;  Location: [facility];  Service: Orthopedics;  Laterality: Left;   UPPER GASTROINTESTINAL ENDOSCOPY      Social History   Socioeconomic History   Marital status: Widowed    Spouse name: [name]   Number of children: 1   Years of education: PHD   Highest education level: Not on file  Occupational History    Comment: Professor,[employer] Education administrator: [employer]  Tobacco Use   Smoking status: Former    Packs/day: 1.00    Years: 40.00    Total pack years: 40.00    Types: Cigarettes    Quit date: 01/19/2007    Years since quitting: 15.1   Smokeless tobacco: Never  Vaping Use   Vaping Use: Never used  Substance and Sexual Activity   Alcohol use: Yes    Alcohol/week: 2.0 standard drinks of alcohol    Types: 2 Cans of beer per week   Drug use: No   Sexual activity: Yes    Birth control/protection: None  Other Topics Concern   Not on file  Social History Narrative   Patient lives alone in his home   Patient is retired   Patient has a Ph.D   Patient has one adult child.   Patient is right-handed.   Patient drinks 1 cup of tea and 1 cup of coffee daily    Social Determinants of Health   Financial Resource Strain: Not on file  Food Insecurity: Not on file  Transportation Needs: Not on file  Physical Activity: Not on file  Stress: Not on file  Social Connections: Not on file  Intimate Partner Violence: Not on file    Family History  Problem Relation Age of Onset   Alzheimer's disease Mother    Cancer Father    Stomach cancer Father    Aortic aneurysm Son    Heart disease Son    Cancer Maternal Grandmother    Heart disease Maternal Grandfather    Stomach cancer Paternal Grandfather    Colon cancer Neg Hx    Colon polyps Neg Hx    Esophageal cancer Neg Hx    Rectal cancer Neg Hx    Pancreatic cancer Neg Hx    Liver cancer Neg Hx      Review of Systems   Constitutional: Negative.  Negative for chills and fever.  HENT: Negative.  Negative for congestion and sore throat.   Eyes: Negative.   Respiratory: Negative.  Negative for cough and shortness of breath.   Cardiovascular: Negative.  Negative for chest pain and palpitations.  Gastrointestinal: Negative.  Negative for abdominal pain, diarrhea, nausea and vomiting.  Genitourinary: Negative.  Negative for dysuria.  Skin: Negative.  Negative for rash.  Neurological:  Negative for dizziness and headaches.  Psychiatric/Behavioral: Negative.    All other systems reviewed and are negative.  Today's Vitals   03/17/22 0908  BP: 138/82  Pulse: 68  Temp: 98.1 F (36.7 C)  TempSrc: Oral  SpO2: 92%  Weight: 256 lb 6 oz (116.3 kg)  Height: '5\' 11"'$  (1.803 m)   Body mass index is 35.76 kg/m. Wt Readings from Last 3 Encounters:  03/17/22 256 lb 6 oz (116.3 kg)  12/16/21 258 lb (117 kg)  10/23/21 259 lb 8 oz (117.7 kg)     Physical Exam Vitals reviewed.  Constitutional:      Appearance: Normal appearance.  HENT:     Head: Normocephalic.  Eyes:     Extraocular Movements: Extraocular movements intact.     Conjunctiva/sclera: Conjunctivae normal.     Pupils: Pupils are equal, round, and reactive to light.  Cardiovascular:     Rate and Rhythm: Normal rate and regular rhythm.     Pulses: Normal pulses.     Heart sounds: Normal heart sounds.  Pulmonary:     Effort: Pulmonary effort is normal.     Breath sounds: Normal breath sounds.  Abdominal:     Palpations: Abdomen is soft.     Tenderness: There is no abdominal tenderness.  Musculoskeletal:     Cervical back: No tenderness.     Right lower leg: No edema.     Left lower leg: No edema.  Lymphadenopathy:     Cervical: No cervical adenopathy.  Skin:    General: Skin is warm and dry.     Capillary Refill: Capillary refill takes less than 2 seconds.  Neurological:     General: No focal deficit present.     Mental Status: He is  alert and oriented to person, place, and time.  Psychiatric:        Mood and Affect: Mood normal.        Behavior: Behavior normal.      ASSESSMENT & PLAN: A total of 46 minutes was spent with the patient and counseling/coordination of care regarding preparing for this visit, review of most recent office visit notes, review of most recent blood work results, review of multiple chronic medical problems and their management, review of all medications, education on nutrition, cardiovascular risks associated with hypertension and dyslipidemia, review of health maintenance items, prognosis, documentation and need for follow-up.  Problem List Items Addressed This Visit       Cardiovascular and Mediastinum   Essential hypertension - Primary    Well-controlled hypertension. BP Readings from Last 3 Encounters:  03/17/22 138/82  12/16/21 116/64  10/23/21 (!) 141/83  Continue valsartan-HCTZ 80-12.5 mg daily. Cardiovascular risks associated with hypertension discussed. Diet and nutrition discussed. Follow-up in 6 months.       Aortic atherosclerosis (HCC)    Continue rosuvastatin 10 mg daily.        Respiratory   OSA (obstructive sleep apnea)    Recently retested.  Stable.  On CPAP treatment.        Digestive   GERD (gastroesophageal reflux disease)    Stable and well-controlled. Continue Prevacid 30 mg daily.        Other   Dyslipidemia    Stable.  Diet and nutrition discussed. Continue rosuvastatin 10 mg daily.       Generalized anxiety disorder    Stable and well-controlled.  Takes trazodone 50 mg at bedtime.      Lower urinary tract  symptoms    Well-controlled on tamsulosin 0.4 mg at bedtime      Patient Instructions  Health Maintenance After Age 10 After age 14, you are at a higher risk for certain long-term diseases and infections as well as injuries from falls. Falls are a major cause of broken bones and head injuries in people who are older than age 34.  Getting regular preventive care can help to keep you healthy and well. Preventive care includes getting regular testing and making lifestyle changes as recommended by your health care provider. Talk with your health care provider about: Which screenings and tests you should have. A screening is a test that checks for a disease when you have no symptoms. A diet and exercise plan that is right for you. What should I know about screenings and tests to prevent falls? Screening and testing are the best ways to find a health problem early. Early diagnosis and treatment give you the best chance of managing medical conditions that are common after age 67. Certain conditions and lifestyle choices may make you more likely to have a fall. Your health care provider may recommend: Regular vision checks. Poor vision and conditions such as cataracts can make you more likely to have a fall. If you wear glasses, make sure to get your prescription updated if your vision changes. Medicine review. Work with your health care provider to regularly review all of the medicines you are taking, including over-the-counter medicines. Ask your health care provider about any side effects that may make you more likely to have a fall. Tell your health care provider if any medicines that you take make you feel dizzy or sleepy. Strength and balance checks. Your health care provider may recommend certain tests to check your strength and balance while standing, walking, or changing positions. Foot health exam. Foot pain and numbness, as well as not wearing proper footwear, can make you more likely to have a fall. Screenings, including: Osteoporosis screening. Osteoporosis is a condition that causes the bones to get weaker and break more easily. Blood pressure screening. Blood pressure changes and medicines to control blood pressure can make you feel dizzy. Depression screening. You may be more likely to have a fall if you have a fear of  falling, feel depressed, or feel unable to do activities that you used to do. Alcohol use screening. Using too much alcohol can affect your balance and may make you more likely to have a fall. Follow these instructions at home: Lifestyle Do not drink alcohol if: Your health care provider tells you not to drink. If you drink alcohol: Limit how much you have to: 0-1 drink a day for women. 0-2 drinks a day for men. Know how much alcohol is in your drink. In the [place] one drink equals one 12 oz bottle of beer (355 mL), one 5 oz glass of wine (148 mL), or one 1 oz glass of hard liquor (44 mL). Do not use any products that contain nicotine or tobacco. These products include cigarettes, chewing tobacco, and vaping devices, such as e-cigarettes. If you need help quitting, ask your health care provider. Activity  Follow a regular exercise program to stay fit. This will help you maintain your balance. Ask your health care provider what types of exercise are appropriate for you. If you need a cane or walker, use it as recommended by your health care provider. Wear supportive shoes that have nonskid soles. Safety  Remove any tripping hazards, such as rugs, cords,  and clutter. Install safety equipment such as grab bars in bathrooms and safety rails on stairs. Keep rooms and walkways well-lit. General instructions Talk with your health care provider about your risks for falling. Tell your health care provider if: You fall. Be sure to tell your health care provider about all falls, even ones that seem minor. You feel dizzy, tiredness (fatigue), or off-balance. Take over-the-counter and prescription medicines only as told by your health care provider. These include supplements. Eat a healthy diet and maintain a healthy weight. A healthy diet includes low-fat dairy products, low-fat (lean) meats, and fiber from whole grains, beans, and lots of fruits and vegetables. Stay current with your  vaccines. Schedule regular health, dental, and eye exams. Summary Having a healthy lifestyle and getting preventive care can help to protect your health and wellness after age 65. Screening and testing are the best way to find a health problem early and help you avoid having a fall. Early diagnosis and treatment give you the best chance for managing medical conditions that are more common for people who are older than age 23. Falls are a major cause of broken bones and head injuries in people who are older than age 75. Take precautions to prevent a fall at home. Work with your health care provider to learn what changes you can make to improve your health and wellness and to prevent falls. This information is not intended to replace advice given to you by your health care provider. Make sure you discuss any questions you have with your health care provider. Document Revised: 01/07/2021 Document Reviewed: 01/07/2021 Elsevier Patient Education  [place], MD [facility]

## 2022-03-31 ENCOUNTER — Encounter: Payer: Self-pay | Admitting: Internal Medicine

## 2022-03-31 NOTE — Progress Notes (Unsigned)
Virtual Visit via Video Note  I connected with Gregg Smith on 03/31/22 at  2:40 PM EDT by a video enabled telemedicine application and verified that I am speaking with the correct person using two identifiers.   I discussed the limitations of evaluation and management by telemedicine and the availability of in person appointments. The patient expressed understanding and agreed to proceed.  Present for the visit:  Myself, Dr Billey Gosling, Marva Panda.  The patient is currently at home and I am in the office.    No referring provider.    History of Present Illness: Acute visit for covid  Symptms started Friday midday, which was 4 days ago.  He tested positive with a home test and then had an official test next morning at [facility].  He has not been taking anything for his symptoms.  He states fatigue, decreased appetite, nasal congestion, mild sore throat, mild sinus pressure, his ears feel clogged and has runny nose.  He has mild cough that is primarily dry, but at one point he was bringing up a little bit of sputum which was minimal.  States mild shortness of breath, heartburn which is not unusual for him, body aches and slight headaches.  Has not started taking anything.   Review of Systems  Constitutional:  Positive for malaise/fatigue. Negative for chills and fever.       Dec appetite  HENT:  Positive for congestion and sore throat (mild). Negative for ear pain and sinus pain.        Sinus pressure, ear feel clogged, runny nose  Respiratory:  Positive for cough (minimall productve  - cough is minimal) and shortness of breath (mild). Negative for wheezing.   Gastrointestinal:  Positive for heartburn. Negative for abdominal pain, diarrhea and nausea.  Musculoskeletal:  Positive for myalgias (slight).  Neurological:  Positive for headaches (slight). Negative for dizziness.      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[employer]  Tobacco Use   Smoking status: Former    Packs/day: 1.00    Years: 40.00    Total pack years: 40.00    Types: Cigarettes    Quit date: 01/19/2007    Years since quitting: 15.2   Smokeless tobacco: Never  Vaping Use   Vaping Use: Never used  Substance and Sexual Activity   Alcohol use: Yes    Alcohol/week: 2.0 standard drinks of alcohol    Types: 2 Cans of beer per week   Drug use: No   Sexual activity: Yes    Birth control/protection: None  Other Topics Concern   Not on file  Social History Narrative   Patient lives alone in his home   Patient is retired   Patient has a Ph.D   Patient has one adult child.   Patient is right-handed.   Patient drinks 1 cup of tea and 1 cup of coffee daily    Social Determinants of Health   Financial Resource Strain: Not on file  Food Insecurity: Not on file  Transportation Needs: Not on file  Physical Activity: Not on file  Stress: Not on file  Social Connections: Not on file     Observations/Objective: Appears well in NAD Breathing  normally and speaking in full sentences Skin is warm and dry  Assessment and Plan:  See Problem List for Assessment and Plan of chronic medical problems.   Follow Up Instructions:    I discussed the assessment and treatment plan with the patient. The patient was provided an opportunity to ask questions and all were answered. The patient agreed with the plan and demonstrated an understanding of the instructions.   The patient was advised to call back or seek an in-person evaluation if the symptoms worsen or if the condition fails to improve as anticipated.    Binnie Rail, MD

## 2022-04-01 ENCOUNTER — Telehealth (INDEPENDENT_AMBULATORY_CARE_PROVIDER_SITE_OTHER): Payer: Medicare PPO | Admitting: Internal Medicine

## 2022-04-01 ENCOUNTER — Other Ambulatory Visit: Payer: Self-pay | Admitting: Internal Medicine

## 2022-04-01 ENCOUNTER — Other Ambulatory Visit: Payer: Self-pay | Admitting: Podiatry

## 2022-04-01 DIAGNOSIS — K2270 Barrett's esophagus without dysplasia: Secondary | ICD-10-CM

## 2022-04-01 DIAGNOSIS — U071 COVID-19: Secondary | ICD-10-CM | POA: Diagnosis not present

## 2022-04-01 DIAGNOSIS — K219 Gastro-esophageal reflux disease without esophagitis: Secondary | ICD-10-CM

## 2022-04-01 MED ORDER — MOLNUPIRAVIR EUA 200MG CAPSULE: 4.0000 | ORAL_CAPSULE | Freq: Two times a day (BID) | ORAL | 0 refills | Status: AC

## 2022-04-01 NOTE — Assessment & Plan Note (Signed)
Acute Today is day 4 of symptoms Symptoms are mild-moderate and given his age he is high risk He is interested in an antiviral medication On a statin and has not had a GFR in several months, so will start molnupiravir 800 mg twice daily x5 days.  Discussed possible side effects Discussed that he can take over-the-counter medications for symptom relief Increase rest and fluids Discussed recommendations for quarantine Advised that if he has any questions about symptoms he should call

## 2022-04-02 NOTE — Telephone Encounter (Signed)
Patient needs an appointment for future refills. 

## 2022-04-22 DIAGNOSIS — F419 Anxiety disorder, unspecified: Secondary | ICD-10-CM | POA: Diagnosis not present

## 2022-04-22 DIAGNOSIS — G4700 Insomnia, unspecified: Secondary | ICD-10-CM | POA: Diagnosis not present

## 2022-05-06 ENCOUNTER — Ambulatory Visit: Payer: Medicare PPO | Admitting: Podiatry

## 2022-05-06 DIAGNOSIS — M79675 Pain in left toe(s): Secondary | ICD-10-CM

## 2022-05-06 DIAGNOSIS — M79674 Pain in right toe(s): Secondary | ICD-10-CM

## 2022-05-06 DIAGNOSIS — B351 Tinea unguium: Secondary | ICD-10-CM

## 2022-05-06 NOTE — Progress Notes (Signed)
  Subjective:  Patient ID: Gregg Smith, male    DOB: 10/21/1945,  MRN: [id number]  Chief Complaint  Patient presents with   Nail Problem    Thick painful toenails, 3 month follow up after nail fungus treatment    76 y.o. male presents with the above complaint. History confirmed with patient.    Objective:  Physical Exam: warm, good capillary refill, no trophic changes or ulcerative lesions, normal DP and PT pulses and normal sensory exam.  Nails with thickened elongated dystrophy subungual debris and yellow discoloration   [place] pathology results with onychomycosis       Assessment:   1. Pain due to onychomycosis of toenails of both feet      Plan:  Patient was evaluated and treated and all questions answered.  Discussed the etiology and treatment options for the condition in detail with the patient.  He will complete the Diflucan course.  Recommended debridement of the nails today. Sharp and mechanical debridement performed of all painful and mycotic nails today. Nails debrided in length and thickness using a nail nipper to level of comfort.  Will likely transition to routine debridement which has been helpful so far    Return in about 3 months (around 08/05/2022) for painful thick nails .

## 2022-05-07 DIAGNOSIS — H0100A Unspecified blepharitis right eye, upper and lower eyelids: Secondary | ICD-10-CM | POA: Diagnosis not present

## 2022-05-07 DIAGNOSIS — H0100B Unspecified blepharitis left eye, upper and lower eyelids: Secondary | ICD-10-CM | POA: Diagnosis not present

## 2022-05-07 DIAGNOSIS — H04123 Dry eye syndrome of bilateral lacrimal glands: Secondary | ICD-10-CM | POA: Diagnosis not present

## 2022-06-04 DIAGNOSIS — G4700 Insomnia, unspecified: Secondary | ICD-10-CM | POA: Diagnosis not present

## 2022-06-04 DIAGNOSIS — F411 Generalized anxiety disorder: Secondary | ICD-10-CM | POA: Diagnosis not present

## 2022-06-11 DIAGNOSIS — N401 Enlarged prostate with lower urinary tract symptoms: Secondary | ICD-10-CM | POA: Diagnosis not present

## 2022-06-11 DIAGNOSIS — R3915 Urgency of urination: Secondary | ICD-10-CM | POA: Diagnosis not present

## 2022-06-11 DIAGNOSIS — N5201 Erectile dysfunction due to arterial insufficiency: Secondary | ICD-10-CM | POA: Diagnosis not present

## 2022-07-16 DIAGNOSIS — F411 Generalized anxiety disorder: Secondary | ICD-10-CM | POA: Diagnosis not present

## 2022-07-16 DIAGNOSIS — G4700 Insomnia, unspecified: Secondary | ICD-10-CM | POA: Diagnosis not present

## 2022-07-21 DIAGNOSIS — F5102 Adjustment insomnia: Secondary | ICD-10-CM | POA: Diagnosis not present

## 2022-07-21 DIAGNOSIS — F411 Generalized anxiety disorder: Secondary | ICD-10-CM | POA: Diagnosis not present

## 2022-08-02 ENCOUNTER — Other Ambulatory Visit: Payer: Self-pay | Admitting: Emergency Medicine

## 2022-08-02 DIAGNOSIS — E785 Hyperlipidemia, unspecified: Secondary | ICD-10-CM

## 2022-08-07 ENCOUNTER — Ambulatory Visit: Payer: Medicare PPO | Admitting: Podiatry

## 2022-08-07 DIAGNOSIS — M79675 Pain in left toe(s): Secondary | ICD-10-CM | POA: Diagnosis not present

## 2022-08-07 DIAGNOSIS — B353 Tinea pedis: Secondary | ICD-10-CM | POA: Diagnosis not present

## 2022-08-07 DIAGNOSIS — M79674 Pain in right toe(s): Secondary | ICD-10-CM

## 2022-08-07 DIAGNOSIS — B351 Tinea unguium: Secondary | ICD-10-CM

## 2022-08-07 MED ORDER — FLUCONAZOLE 150 MG PO TABS: 150.0000 mg | ORAL_TABLET | ORAL | 0 refills | Status: AC

## 2022-08-07 NOTE — Progress Notes (Signed)
  Subjective:  Patient ID: Gregg Smith, male    DOB: [birth year],  MRN: [id number]  Chief Complaint  Patient presents with   Nail Problem    Thick painful toenails, 3 month follow up    76 y.o. male presents with the above complaint. History confirmed with patient.  He also notes dry itching scaling skin and maceration between the toes on the outside of the foot  Objective:  Physical Exam: warm, good capillary refill, no trophic changes or ulcerative lesions, normal DP and PT pulses and normal sensory exam.  Nails with thickened elongated dystrophy subungual debris and yellow discoloration x 10.  He has interdigital maceration tinea pedis fourth interspace bilateral, dry scaling moccasin rash distribution   [place]ako pathology results with onychomycosis       Assessment:   1. Tinea pedis of both feet   2. Pain due to onychomycosis of toenails of both feet      Plan:  Patient was evaluated and treated and all questions answered.  Discussed the etiology and treatment options for the condition in detail with the patient.Recommended debridement of the nails today. Sharp and mechanical debridement performed of all painful and mycotic nails today. Nails debrided in length and thickness using a nail nipper to level of comfort.    Discussed the etiology and treatment options for tinea pedis.  Discussed topical and oral treatment.  Recommended oral treatment with 4 weeks Diflucan weekly, he is currently using a topical medication as well.  This was sent to the patient's pharmacy.  Also discussed appropriate foot hygiene, use of antifungal spray such as Tinactin in shoes, as well as cleaning her foot surfaces such as showers and bathroom floors with bleach.   Return in about 3 months (around 11/06/2022) for [place].

## 2022-08-11 DIAGNOSIS — F419 Anxiety disorder, unspecified: Secondary | ICD-10-CM | POA: Diagnosis not present

## 2022-08-11 DIAGNOSIS — F411 Generalized anxiety disorder: Secondary | ICD-10-CM | POA: Diagnosis not present

## 2022-08-13 ENCOUNTER — Ambulatory Visit: Payer: Medicare PPO | Attending: Cardiology | Admitting: Cardiology

## 2022-08-13 ENCOUNTER — Encounter: Payer: Self-pay | Admitting: Cardiology

## 2022-08-13 VITALS — BP 126/74 | HR 77 | Ht 71.0 in | Wt 269.4 lb

## 2022-08-13 DIAGNOSIS — E669 Obesity, unspecified: Secondary | ICD-10-CM

## 2022-08-13 DIAGNOSIS — I10 Essential (primary) hypertension: Secondary | ICD-10-CM

## 2022-08-13 DIAGNOSIS — Z6836 Body mass index (BMI) 36.0-36.9, adult: Secondary | ICD-10-CM

## 2022-08-13 DIAGNOSIS — R0602 Shortness of breath: Secondary | ICD-10-CM

## 2022-08-13 DIAGNOSIS — E785 Hyperlipidemia, unspecified: Secondary | ICD-10-CM | POA: Diagnosis not present

## 2022-08-13 DIAGNOSIS — R600 Localized edema: Secondary | ICD-10-CM | POA: Diagnosis not present

## 2022-08-13 NOTE — Patient Instructions (Signed)
Medication Instructions:  Your physician recommends that you continue on your current medications as directed. Please refer to the Current Medication list given to you today.  *If you need a refill on your cardiac medications before your next appointment, please call your pharmacy*   Lab Work: CMET, Mag today  If you have labs (blood work) drawn today and your tests are completely normal, you will receive your results only by: [place] (if you have MyChart) OR A paper copy in the mail If you have any lab test that is abnormal or we need to change your treatment, we will call you to review the results.   Testing/Procedures: Your physician has requested that you have an echocardiogram. Echocardiography is a painless test that uses sound waves to create images of your heart. It provides your doctor with information about the size and shape of your heart and how well your heart's chambers and valves are working. This procedure takes approximately one hour. There are no restrictions for this procedure. Please do NOT wear cologne, perfume, aftershave, or lotions (deodorant is allowed). Please arrive 15 minutes prior to your appointment time.  Follow-Up: At [facility], you and your health needs are our priority.  As part of our continuing mission to provide you with exceptional heart care, we have created designated Provider Care Teams.  These Care Teams include your primary Cardiologist (physician) and Advanced Practice Providers (APPs -  Physician Assistants and Nurse Practitioners) who all work together to provide you with the care you need, when you need it.  We recommend signing up for the patient portal called "MyChart".  Sign up information is provided on this After Visit Summary.  MyChart is used to connect with patients for Virtual Visits (Telemedicine).  Patients are able to view lab/test results, encounter notes, upcoming appointments, etc.  Non-urgent messages can be  sent to your provider as well.   To learn more about what you can do with MyChart, go to [email or web address].    Your next appointment:   12 month(s)  The format for your next appointment:   In Person  Provider:   Dr. Gardiner Rhyme Other Instructions You have been referred to: PREP program at [facility] --they will contact you

## 2022-08-13 NOTE — Progress Notes (Signed)
Cardiology Office Note:    Date:  08/13/2022   ID:  Gregg Smith, DOB 11-06-1945, MRN [id number]  PCP:  Horald Pollen, MD  Cardiologist:  None  Electrophysiologist:  None   Referring MD: Horald Pollen, *   Chief Complaint  Patient presents with   Edema    History of Present Illness:    Gregg Smith is a 76 y.o. male with a hx of hypertension, hyperlipidemia, OSA, GERD, Barrett's esophagus who presents for follow-up.  He was referred by Dr. Mitchel Honour for evaluation of chest pain, initially seen 12/2019.  He reports that chest pain started 2 months ago.  States that it started more as upper abdominal pain but is since migrated into his chest.  Describes as dull aching pain in center of his chest.  Occurs intermittently.  Particularly notices it when he lies on his left side.  States is 2 out of 10 in intensity.  Typically would last for a couple of hours when it occurs.  He does not exercise regularly.  States the most exertion he does is gardening.  No clear relationship between chest pain and exertion.  Does report he gets dyspnea when walking upstairs or when he walks fast.  He denies any lightheadedness, syncope, palpitations, lower extremity edema.  He smoked up to 2 packs/day x 40 years, quit in 2008.  No history of heart disease in his immediate family.  Echocardiogram 02/15/2020 showed EF 60 to 65%, mild LVH, grade 1 diastolic dysfunction, normal RV function, mild aortic regurgitation.  Coronary CTA 02/20/2020 showed normal coronary arteries, calcium score 0, dilated pulmonary artery measuring up to 36 mm.  Since last clinic visit, he reports that he is doing well.  Denies any chest pain, dyspnea, lightheadedness, syncope, or palpitations.  Does report his been having some lower extremity edema.  He gardens and walks for exercise.  Does report some dyspnea when walking in the heat.  He is planning retirement in July 2024.   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   fluconazole (DIFLUCAN) 150 MG tablet Take 1 tablet (150 mg total) by mouth once a week for 4 doses.   lansoprazole (PREVACID) 30 MG capsule TAKE 1 CAPSULE (30 MG TOTAL) BY MOUTH 2 (TWO) TIMES DAILY BEFORE A MEAL.   Multiple Vitamin (MULTIVITAMIN) tablet Take 1 tablet by mouth daily.  rosuvastatin (CRESTOR) 10 MG tablet TAKE 1 TABLET BY MOUTH EVERY DAY   SYMBICORT 80-4.5 MCG/ACT inhaler TAKE 2 PUFFS BY MOUTH TWICE A DAY   tamsulosin (FLOMAX) 0.4 MG CAPS capsule TAKE 1 CAPSULE BY MOUTH DAILY AT NIGHT.   traZODone (DESYREL) 50 MG tablet Take 1 tablet (50 mg total) by mouth at bedtime and may repeat dose one time if needed.   valsartan-hydrochlorothiazide (DIOVAN-HCT) 80-12.5 MG tablet Take 1 tablet by mouth daily.     Allergies:   Penicillins   Social History   Socioeconomic History   Marital status: Widowed    Spouse name: [name]   Number of children: 1   Years of education: PHD   Highest education level: Not on file  Occupational History    Comment: Professor,[employer] Education administrator: [employer]   Tobacco Use   Smoking status: Former    Packs/day: 1.00    Years: 40.00    Total pack years: 40.00    Types: Cigarettes    Quit date: 01/19/2007    Years since quitting: 15.5   Smokeless tobacco: Never  Vaping Use   Vaping Use: Never used  Substance and Sexual Activity   Alcohol use: Yes    Alcohol/week: 2.0 standard drinks of alcohol    Types: 2 Cans of beer per week   Drug use: No   Sexual activity: Yes    Birth control/protection: None  Other Topics Concern   Not on file  Social History Narrative   Patient lives alone in his home   Patient is retired   Patient has a Ph.D   Patient has one adult child.   Patient is right-handed.   Patient drinks 1 cup of tea and 1 cup of coffee daily    Social Determinants of Health   Financial Resource Strain: Not on file  Food Insecurity: Not on file  Transportation Needs: Not on file  Physical Activity: Not on file  Stress: Not on file  Social Connections: Not on file     Family History: The patient's family history includes Alzheimer's disease in his mother; Aortic aneurysm in his son; Cancer in his father and maternal grandmother; Heart disease in his maternal grandfather and son; Stomach cancer in his father and paternal grandfather. There is no history of Colon cancer, Colon polyps, Esophageal cancer, Rectal cancer, Pancreatic cancer, or Liver cancer.  ROS:   Please see the history of present illness.     All other systems reviewed and are negative.  EKGs/Labs/Other Studies Reviewed:    The following studies were reviewed today:  EKG:   08/13/2022: Normal sinus rhythm, rate 77, no ST abnormality  Recent Labs: 09/05/2021: ALT 35; BUN 18; Creatinine, Ser 1.08; Hemoglobin 14.5; Platelets 185.0; Potassium 4.0; Sodium 139  Recent Lipid Panel    Component Value Date/Time   CHOL 123 09/05/2021 0950   CHOL 143 03/06/2020 1012   TRIG 108.0 09/05/2021 0950   HDL 35.90 (L) 09/05/2021 0950   HDL 37 (L) 03/06/2020 1012    CHOLHDL 3 09/05/2021 0950   VLDL 21.6 09/05/2021 0950   LDLCALC 66 09/05/2021 0950   LDLCALC 83 03/06/2020 1012   LDLDIRECT 91 02/17/2014 1153    Physical Exam:    VS:  BP 126/74 (BP Location: Left Arm, Patient Position: Sitting, Cuff Size: Large)   Pulse 77   Ht '5\' 11"'$  (1.803 m)   Wt 269 lb 6.4 oz (122.2 kg)   SpO2 95%  BMI 37.57 kg/m     Wt Readings from Last 3 Encounters:  08/13/22 269 lb 6.4 oz (122.2 kg)  03/17/22 256 lb 6 oz (116.3 kg)  12/16/21 258 lb (117 kg)     GEN:  Well nourished, well developed in no acute distress HEENT: Normal NECK: No JVD; No carotid bruits CARDIAC: RRR, no murmurs, rubs, gallops RESPIRATORY:  Clear to auscultation without rales, wheezing or rhonchi  ABDOMEN: Soft, non-tender, non-distended MUSCULOSKELETAL:  1+ BLE edema SKIN: Warm and dry NEUROLOGIC:  Alert and oriented x 3 PSYCHIATRIC:  Normal affect   ASSESSMENT:    1. Essential hypertension   2. Bilateral leg edema   3. Shortness of breath   4. Hyperlipidemia, unspecified hyperlipidemia type   5. Class 2 obesity without serious comorbidity with body mass index (BMI) of 36.0 to 36.9 in adult, unspecified obesity type     PLAN:    Chest pain/dyspnea on exertion: Echocardiogram 02/15/2020 showed EF 60 to 65%, mild LVH, grade 1 diastolic dysfunction, normal RV function, mild aortic regurgitation.  Coronary CTA 02/20/2020 showed normal coronary arteries, calcium score 0, dilated pulmonary artery measuring up to 36 mm. -Reports symptoms have improved.  Denies any chest pain, reports dyspnea only with exertion during hot weather  Lower extremity edema: Mild bilateral lower extremity edema.  No JVD and lungs CTAB.  Suspect venous insufficiency.  Will check echocardiogram.  Check CMET, BNP  Hypertension: On lisinopril-hydrochlorothiazide 10-12.5 mg daily.  Appears controlled  Hyperlipidemia: On rosuvastatin 10 mg daily.  LDL 66 on 09/05/21.  Calcium score 0 on 02/20/2020  OSA: uses oral  appliance, did not need CPAP  Obesity: Body mass index is 37.57 kg/m. Diet/exercise recommended.  Will refer to [place] program   RTC in 1 year   Medication Adjustments/Labs and Tests Ordered: Current medicines are reviewed at length with the patient today.  Concerns regarding medicines are outlined above.  Orders Placed This Encounter  Procedures   Comprehensive metabolic panel   Magnesium   Brain natriuretic peptide   Amb Referral To Provider Referral Exercise Program (P.R.E.P)   EKG 12-Lead   ECHOCARDIOGRAM COMPLETE   No orders of the defined types were placed in this encounter.   Patient Instructions  Medication Instructions:  Your physician recommends that you continue on your current medications as directed. Please refer to the Current Medication list given to you today.  *If you need a refill on your cardiac medications before your next appointment, please call your pharmacy*   Lab Work: CMET, Mag today  If you have labs (blood work) drawn today and your tests are completely normal, you will receive your results only by: [place] (if you have MyChart) OR A paper copy in the mail If you have any lab test that is abnormal or we need to change your treatment, we will call you to review the results.   Testing/Procedures: Your physician has requested that you have an echocardiogram. Echocardiography is a painless test that uses sound waves to create images of your heart. It provides your doctor with information about the size and shape of your heart and how well your heart's chambers and valves are working. This procedure takes approximately one hour. There are no restrictions for this procedure. Please do NOT wear cologne, perfume, aftershave, or lotions (deodorant is allowed). Please arrive 15 minutes prior to your appointment time.  Follow-Up: At [facility], you and your health needs are our priority.  As part of our continuing mission  to provide  you with exceptional heart care, we have created designated Provider Care Teams.  These Care Teams include your primary Cardiologist (physician) and Advanced Practice Providers (APPs -  Physician Assistants and Nurse Practitioners) who all work together to provide you with the care you need, when you need it.  We recommend signing up for the patient portal called "MyChart".  Sign up information is provided on this After Visit Summary.  MyChart is used to connect with patients for Virtual Visits (Telemedicine).  Patients are able to view lab/test results, encounter notes, upcoming appointments, etc.  Non-urgent messages can be sent to your provider as well.   To learn more about what you can do with MyChart, go to [email or web address].    Your next appointment:   12 month(s)  The format for your next appointment:   In Person  Provider:   Dr. Gardiner Rhyme Other Instructions You have been referred to: PREP program at [facility] --they will contact you         Signed, Donato Heinz, MD  08/13/2022 5:34 PM    [place]

## 2022-08-14 LAB — COMPREHENSIVE METABOLIC PANEL
ALT: 27 IU/L (ref 0–44)
AST: 44 IU/L — ABNORMAL HIGH (ref 0–40)
Albumin/Globulin Ratio: 1.3 (ref 1.2–2.2)
Albumin: 3.9 g/dL (ref 3.8–4.8)
Alkaline Phosphatase: 74 IU/L (ref 44–121)
BUN/Creatinine Ratio: 16 (ref 10–24)
BUN: 19 mg/dL (ref 8–27)
Bilirubin Total: 0.3 mg/dL (ref 0.0–1.2)
CO2: 23 mmol/L (ref 20–29)
Calcium: 8.9 mg/dL (ref 8.6–10.2)
Chloride: 104 mmol/L (ref 96–106)
Creatinine, Ser: 1.18 mg/dL (ref 0.76–1.27)
Globulin, Total: 2.9 g/dL (ref 1.5–4.5)
Glucose: 90 mg/dL (ref 70–99)
Potassium: 4 mmol/L (ref 3.5–5.2)
Sodium: 141 mmol/L (ref 134–144)
Total Protein: 6.8 g/dL (ref 6.0–8.5)
eGFR: 64 mL/min/{1.73_m2} (ref >59)

## 2022-08-14 LAB — BRAIN NATRIURETIC PEPTIDE: BNP: 23.1 pg/mL (ref 0.0–100.0)

## 2022-08-14 LAB — MAGNESIUM: Magnesium: 2 mg/dL (ref 1.6–2.3)

## 2022-08-15 ENCOUNTER — Telehealth: Payer: Self-pay

## 2022-08-15 NOTE — Telephone Encounter (Signed)
Call to pt reference referral to PREP He is currently driving and requests to call back later.  Will await call back

## 2022-08-18 ENCOUNTER — Telehealth: Payer: Self-pay

## 2022-08-18 NOTE — Telephone Encounter (Signed)
Pt returned call reference PREP referral. Explained program and locations available. Would like to participate and would prefera an am class if not too early. Advised coach will call him with start dates of class. Pt agreeable to plan

## 2022-08-20 ENCOUNTER — Other Ambulatory Visit: Payer: Self-pay | Admitting: Emergency Medicine

## 2022-08-20 ENCOUNTER — Telehealth: Payer: Self-pay

## 2022-08-20 DIAGNOSIS — I10 Essential (primary) hypertension: Secondary | ICD-10-CM

## 2022-08-20 NOTE — Telephone Encounter (Signed)
Called to discuss [place] program schedule; left voicemail

## 2022-08-29 ENCOUNTER — Telehealth: Payer: Self-pay

## 2022-08-29 NOTE — Telephone Encounter (Signed)
Called again to discuss PREP program referral and schedule at [facility], left voicemail

## 2022-09-02 ENCOUNTER — Telehealth: Payer: Self-pay

## 2022-09-02 NOTE — Telephone Encounter (Signed)
Returned my call, explained PREP; he would like class at [facility] on 09/29/22, every M/W 12:30-1:45; will contact later this month to set up assessment visit.

## 2022-09-08 ENCOUNTER — Ambulatory Visit (HOSPITAL_COMMUNITY): Payer: Medicare PPO | Attending: Cardiology

## 2022-09-08 DIAGNOSIS — R0602 Shortness of breath: Secondary | ICD-10-CM | POA: Insufficient documentation

## 2022-09-08 DIAGNOSIS — R600 Localized edema: Secondary | ICD-10-CM

## 2022-09-08 LAB — ECHOCARDIOGRAM COMPLETE
Area-P 1/2: 3.03 cm2
S' Lateral: 3.1 cm

## 2022-09-12 ENCOUNTER — Encounter: Payer: Self-pay | Admitting: *Deleted

## 2022-09-12 ENCOUNTER — Other Ambulatory Visit: Payer: Self-pay | Admitting: Internal Medicine

## 2022-09-12 DIAGNOSIS — K219 Gastro-esophageal reflux disease without esophagitis: Secondary | ICD-10-CM

## 2022-09-12 DIAGNOSIS — K2270 Barrett's esophagus without dysplasia: Secondary | ICD-10-CM

## 2022-09-15 DIAGNOSIS — F411 Generalized anxiety disorder: Secondary | ICD-10-CM | POA: Diagnosis not present

## 2022-09-15 DIAGNOSIS — F4329 Adjustment disorder with other symptoms: Secondary | ICD-10-CM | POA: Diagnosis not present

## 2022-09-17 ENCOUNTER — Ambulatory Visit: Payer: Medicare PPO | Admitting: Emergency Medicine

## 2022-09-17 ENCOUNTER — Telehealth: Payer: Self-pay

## 2022-09-17 ENCOUNTER — Encounter: Payer: Self-pay | Admitting: Emergency Medicine

## 2022-09-17 VITALS — BP 122/82 | HR 73 | Temp 98.0°F | Ht 71.0 in | Wt 267.0 lb

## 2022-09-17 DIAGNOSIS — K219 Gastro-esophageal reflux disease without esophagitis: Secondary | ICD-10-CM | POA: Diagnosis not present

## 2022-09-17 DIAGNOSIS — R399 Unspecified symptoms and signs involving the genitourinary system: Secondary | ICD-10-CM | POA: Diagnosis not present

## 2022-09-17 DIAGNOSIS — E785 Hyperlipidemia, unspecified: Secondary | ICD-10-CM | POA: Diagnosis not present

## 2022-09-17 DIAGNOSIS — F411 Generalized anxiety disorder: Secondary | ICD-10-CM

## 2022-09-17 DIAGNOSIS — N3281 Overactive bladder: Secondary | ICD-10-CM | POA: Diagnosis not present

## 2022-09-17 DIAGNOSIS — I10 Essential (primary) hypertension: Secondary | ICD-10-CM

## 2022-09-17 DIAGNOSIS — G4733 Obstructive sleep apnea (adult) (pediatric): Secondary | ICD-10-CM | POA: Diagnosis not present

## 2022-09-17 DIAGNOSIS — I700 Atherosclerosis of aorta: Secondary | ICD-10-CM | POA: Diagnosis not present

## 2022-09-17 LAB — CBC WITH DIFFERENTIAL/PLATELET
Basophils Absolute: 0 10*3/uL (ref 0.0–0.1)
Basophils Relative: 0.6 % (ref 0.0–3.0)
Eosinophils Absolute: 0.2 10*3/uL (ref 0.0–0.7)
Eosinophils Relative: 2.7 % (ref 0.0–5.0)
HCT: 42.6 % (ref 39.0–52.0)
Hemoglobin: 14.8 g/dL (ref 13.0–17.0)
Lymphocytes Relative: 12.2 % (ref 12.0–46.0)
Lymphs Abs: 0.9 10*3/uL (ref 0.7–4.0)
MCHC: 34.8 g/dL (ref 30.0–36.0)
MCV: 83.8 fl (ref 78.0–100.0)
Monocytes Absolute: 1 10*3/uL (ref 0.1–1.0)
Monocytes Relative: 13.3 % — ABNORMAL HIGH (ref 3.0–12.0)
Neutro Abs: 5.4 10*3/uL (ref 1.4–7.7)
Neutrophils Relative %: 71.2 % (ref 43.0–77.0)
Platelets: 191 10*3/uL (ref 150.0–400.0)
RBC: 5.08 Mil/uL (ref 4.22–5.81)
RDW: 15.6 % — ABNORMAL HIGH (ref 11.5–15.5)
WBC: 7.6 10*3/uL (ref 4.0–10.5)

## 2022-09-17 LAB — LIPID PANEL
Cholesterol: 128 mg/dL (ref 0–200)
HDL: 40.1 mg/dL (ref >39.00)
LDL Cholesterol: 54 mg/dL (ref 0–99)
NonHDL: 87.8
Total CHOL/HDL Ratio: 3
Triglycerides: 167 mg/dL — ABNORMAL HIGH (ref 0.0–149.0)
VLDL: 33.4 mg/dL (ref 0.0–40.0)

## 2022-09-17 LAB — HEMOGLOBIN A1C: Hgb A1c MFr Bld: 5.5 % (ref 4.6–6.5)

## 2022-09-17 MED ORDER — GEMTESA 75 MG PO TABS: 75.0000 mg | ORAL_TABLET | Freq: Every day | ORAL | 3 refills | Status: DC

## 2022-09-17 NOTE — Progress Notes (Signed)
Gregg Smith 77 y.o.   Chief Complaint  Patient presents with   Follow-up    41mth f/u appt, no concerns     HISTORY OF PRESENT ILLNESS: This is a 77y.o. male here for 668-monthollow-up of chronic medical conditions Complaining of lower urinary tract symptoms, mostly urgency and frequency Presently on Flomax No other complaints or medical concerns today. BP Readings from Last 3 Encounters:  09/17/22 122/82  08/13/22 126/74  03/17/22 138/82   Wt Readings from Last 3 Encounters:  09/17/22 267 lb (121.1 kg)  08/13/22 269 lb 6.4 oz (122.2 kg)  03/17/22 256 lb 6 oz (116.3 kg)     HPI   Prior to Admission medications   Medication Sig Start Date End Date Taking? Authorizing Provider  CLINPRO 5000 1.1 % PSTE Place onto teeth daily. 03/23/20  Yes [provider]  lansoprazole (PREVACID) 30 MG capsule TAKE 1 CAPSULE (30 MG TOTAL) BY MOUTH 2 (TWO) TIMES DAILY BEFORE A MEAL. 10/07/21  Yes PeIrene ShipperMD  Multiple Vitamin (MULTIVITAMIN) tablet Take 1 tablet by mouth daily.   Yes [provider]  rosuvastatin (CRESTOR) 10 MG tablet TAKE 1 TABLET BY MOUTH EVERY DAY 08/03/22  Yes Vadim Centola, MiInes BloomerMD  SYMBICORT 80-4.5 MCG/ACT inhaler TAKE 2 PUFFS BY MOUTH TWICE A DAY 12/05/21  Yes Kerisha Goughnour, MiInes BloomerMD  tamsulosin (FLOMAX) 0.4 MG CAPS capsule TAKE 1 CAPSULE BY MOUTH DAILY AT NIGHT. 03/05/21  Yes Eden Rho, MiInes BloomerMD  traZODone (DESYREL) 50 MG tablet Take 1 tablet (50 mg total) by mouth at bedtime and may repeat dose one time if needed. 04/16/19  Yes LeDerrill CenterNP  valsartan-hydrochlorothiazide (DIOVAN-HCT) 80-12.5 MG tablet TAKE 1 TABLET BY MOUTH EVERY DAY 08/20/22  Yes Jaleyah Longhi, MiInes BloomerMD    Allergies  Allergen Reactions   Penicillins Hives and Rash    Has patient had a PCN reaction causing immediate rash, facial/tongue/throat swelling, SOB or lightheadedness with hypotension:unsure Has patient had a PCN reaction causing severe rash  involving mucus membranes or skin necrosis:unsure Has patient had a PCN reaction that required hospitalization:No Has patient had a PCN reaction occurring within the last 10 years:No If all of the above answers are "NO", then may proceed with Cephalosporin use. Childhood reaction     Patient Active Problem List   Diagnosis Date Noted   COVID 04/01/2022   Lower urinary tract symptoms 03/17/2022   Aortic atherosclerosis ([place]/17/2023   Loud snoring 10/23/2021   Acute insomnia 10/23/2021   Chronic left-sided low back pain with left-sided sciatica 03/05/2021   Osteoarthritis of knee 01/31/2021   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   Scoliosis deformity of spine 04/22/2019   Generalized anxiety disorder    Panic disorder    Insomnia    Spinal stenosis of lumbar region 11/30/2018   Dental plaque 11/02/2018   Lumbar radiculopathy 10/06/2018   Sciatica 09/01/2018   GERD (gastroesophageal reflux disease) 08/14/2017   S/P left TKA 08/11/2016   S/P knee replacement 08/11/2016   OSA (obstructive sleep apnea) 03/22/2014   Snoring disorder 05/09/2013   Essential hypertension 02/11/2013   Dyslipidemia 02/11/2013   Arthritis 02/11/2013    Past Medical History:  Diagnosis Date   Allergy    seasonal    Anxiety    Arthritis    Barrett's esophagus    Cataract    Chicken pox    Colon polyps    hyperplastic   Diverticulosis    GERD (gastroesophageal  reflux disease)    Hemorrhoids    Hyperlipidemia    Hypertension    Leg cramps    Measles    Mumps    Obstructive sleep apnea    Pneumonia    Sleep apnea    dental device    Past Surgical History:  Procedure Laterality Date   APPENDECTOMY  1957   CATARACT EXTRACTION     COLONOSCOPY     JOINT REPLACEMENT     TONSILLECTOMY     TONSILLECTOMY AND ADENOIDECTOMY  1954   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   Procedure: LEFT TOTAL KNEE ARTHROPLASTY;  Surgeon: Paralee Cancel, MD;  Location: [facility];  Service:  Orthopedics;  Laterality: Left;   UPPER GASTROINTESTINAL ENDOSCOPY      Social History   Socioeconomic History   Marital status: Widowed    Spouse name: [name]   Number of children: 1   Years of education: PHD   Highest education level: Not on file  Occupational History    Comment: Professor,[employer] Education administrator: [employer]  Tobacco Use   Smoking status: Former    Packs/day: 1.00    Years: 40.00    Total pack years: 40.00    Types: Cigarettes    Quit date: 01/19/2007    Years since quitting: 15.6   Smokeless tobacco: Never  Vaping Use   Vaping Use: Never used  Substance and Sexual Activity   Alcohol use: Yes    Alcohol/week: 2.0 standard drinks of alcohol    Types: 2 Cans of beer per week   Drug use: No   Sexual activity: Yes    Birth control/protection: None  Other Topics Concern   Not on file  Social History Narrative   Patient lives alone in his home   Patient is retired   Patient has a Ph.D   Patient has one adult child.   Patient is right-handed.   Patient drinks 1 cup of tea and 1 cup of coffee daily    Social Determinants of Health   Financial Resource Strain: Not on file  Food Insecurity: Not on file  Transportation Needs: Not on file  Physical Activity: Not on file  Stress: Not on file  Social Connections: Not on file  Intimate Partner Violence: Not on file    Family History  Problem Relation Age of Onset   Alzheimer's disease Mother    Cancer Father    Stomach cancer Father    Aortic aneurysm Son    Heart disease Son    Cancer Maternal Grandmother    Heart disease Maternal Grandfather    Stomach cancer Paternal Grandfather    Colon cancer Neg Hx    Colon polyps Neg Hx    Esophageal cancer Neg Hx    Rectal cancer Neg Hx    Pancreatic cancer Neg Hx    Liver cancer Neg Hx      Review of Systems  Constitutional: Negative.  Negative for chills and fever.  HENT: Negative.  Negative for congestion and sore throat.    Respiratory: Negative.  Negative for cough and shortness of breath.   Cardiovascular: Negative.  Negative for chest pain and palpitations.  Gastrointestinal:  Negative for abdominal pain, diarrhea, nausea and vomiting.  Genitourinary:  Positive for frequency and urgency. Negative for hematuria.  Skin: Negative.  Negative for rash.  Neurological: Negative.  Negative for dizziness and headaches.  All other systems reviewed and are negative.  Today's Vitals  09/17/22 0918  BP: 122/82  Pulse: 73  Temp: 98 F (36.7 C)  TempSrc: Oral  SpO2: 94%  Weight: 267 lb (121.1 kg)  Height: '5\' 11"'$  (1.803 m)   Body mass index is 37.24 kg/m.   Physical Exam Vitals reviewed.  Constitutional:      Appearance: Normal appearance.  HENT:     Head: Normocephalic.     Mouth/Throat:     Mouth: Mucous membranes are moist.     Pharynx: Oropharynx is clear.  Eyes:     Extraocular Movements: Extraocular movements intact.     Pupils: Pupils are equal, round, and reactive to light.  Cardiovascular:     Rate and Rhythm: Normal rate and regular rhythm.     Pulses: Normal pulses.     Heart sounds: Normal heart sounds.  Pulmonary:     Effort: Pulmonary effort is normal.     Breath sounds: Normal breath sounds.  Musculoskeletal:     Cervical back: No tenderness.  Lymphadenopathy:     Cervical: No cervical adenopathy.  Skin:    General: Skin is warm and dry.  Neurological:     General: No focal deficit present.     Mental Status: He is alert and oriented to person, place, and time.  Psychiatric:        Mood and Affect: Mood normal.        Behavior: Behavior normal.      ASSESSMENT & PLAN: A total of 47 minutes was spent with the patient and counseling/coordination of care regarding preparing for this visit, review of most recent office visit notes, review of multiple chronic medical conditions and their management, review of all medications, review of most recent blood work results, education  on nutrition, cardiovascular risks associated with hypertension, prognosis, documentation, and need for follow-up.  Problem List Items Addressed This Visit       Cardiovascular and Mediastinum   Essential hypertension - Primary    Well-controlled hypertension. Continue Diovan HCT 80-12.5 mg daily. Cardiovascular risk associated with hypertension discussed Dietary approaches to stop hypertension discussed.      Relevant Orders   Lipid panel   Hemoglobin A1c   CBC with Differential/Platelet   Aortic atherosclerosis (HCC)    Stable.  Diet and nutrition discussed. Continue rosuvastatin 10 mg daily.         Respiratory   OSA (obstructive sleep apnea)    Stable.  Using dental appliance.  No CPAP.        Digestive   GERD (gastroesophageal reflux disease)    Stable.  Well-controlled.  Continue Prevacid 30 mg daily.        Genitourinary   Overactive bladder    Active and affecting quality of life. Continue Flomax and start Gemtesa 75 mg daily.      Relevant Medications   Vibegron (GEMTESA) 75 MG TABS     Other   Dyslipidemia    Stable.  Diet and nutrition discussed. Continue rosuvastatin 10 mg daily.      Relevant Orders   Lipid panel   Hemoglobin A1c   CBC with Differential/Platelet   Generalized anxiety disorder    Well-controlled.  Continue trazodone 50 mg at bedtime      Lower urinary tract symptoms    Active and affecting quality of life.  Presently taking Flomax 0.4 mg twice a day Recommend to start Gemtesa 75 mg daily.      Relevant Medications   Vibegron (GEMTESA) 75 MG TABS   Patient Instructions  Health Maintenance After Age 63 After age 105, you are at a higher risk for certain long-term diseases and infections as well as injuries from falls. Falls are a major cause of broken bones and head injuries in people who are older than age 73. Getting regular preventive care can help to keep you healthy and well. Preventive care includes getting regular  testing and making lifestyle changes as recommended by your health care provider. Talk with your health care provider about: Which screenings and tests you should have. A screening is a test that checks for a disease when you have no symptoms. A diet and exercise plan that is right for you. What should I know about screenings and tests to prevent falls? Screening and testing are the best ways to find a health problem early. Early diagnosis and treatment give you the best chance of managing medical conditions that are common after age 49. Certain conditions and lifestyle choices may make you more likely to have a fall. Your health care provider may recommend: Regular vision checks. Poor vision and conditions such as cataracts can make you more likely to have a fall. If you wear glasses, make sure to get your prescription updated if your vision changes. Medicine review. Work with your health care provider to regularly review all of the medicines you are taking, including over-the-counter medicines. Ask your health care provider about any side effects that may make you more likely to have a fall. Tell your health care provider if any medicines that you take make you feel dizzy or sleepy. Strength and balance checks. Your health care provider may recommend certain tests to check your strength and balance while standing, walking, or changing positions. Foot health exam. Foot pain and numbness, as well as not wearing proper footwear, can make you more likely to have a fall. Screenings, including: Osteoporosis screening. Osteoporosis is a condition that causes the bones to get weaker and break more easily. Blood pressure screening. Blood pressure changes and medicines to control blood pressure can make you feel dizzy. Depression screening. You may be more likely to have a fall if you have a fear of falling, feel depressed, or feel unable to do activities that you used to do. Alcohol use screening. Using too  much alcohol can affect your balance and may make you more likely to have a fall. Follow these instructions at home: Lifestyle Do not drink alcohol if: Your health care provider tells you not to drink. If you drink alcohol: Limit how much you have to: 0-1 drink a day for women. 0-2 drinks a day for men. Know how much alcohol is in your drink. In the [place] one drink equals one 12 oz bottle of beer (355 mL), one 5 oz glass of wine (148 mL), or one 1 oz glass of hard liquor (44 mL). Do not use any products that contain nicotine or tobacco. These products include cigarettes, chewing tobacco, and vaping devices, such as e-cigarettes. If you need help quitting, ask your health care provider. Activity  Follow a regular exercise program to stay fit. This will help you maintain your balance. Ask your health care provider what types of exercise are appropriate for you. If you need a cane or walker, use it as recommended by your health care provider. Wear supportive shoes that have nonskid soles. Safety  Remove any tripping hazards, such as rugs, cords, and clutter. Install safety equipment such as grab bars in bathrooms and safety rails on stairs. Keep rooms and  walkways well-lit. General instructions Talk with your health care provider about your risks for falling. Tell your health care provider if: You fall. Be sure to tell your health care provider about all falls, even ones that seem minor. You feel dizzy, tiredness (fatigue), or off-balance. Take over-the-counter and prescription medicines only as told by your health care provider. These include supplements. Eat a healthy diet and maintain a healthy weight. A healthy diet includes low-fat dairy products, low-fat (lean) meats, and fiber from whole grains, beans, and lots of fruits and vegetables. Stay current with your vaccines. Schedule regular health, dental, and eye exams. Summary Having a healthy lifestyle and getting preventive care can  help to protect your health and wellness after age 55. Screening and testing are the best way to find a health problem early and help you avoid having a fall. Early diagnosis and treatment give you the best chance for managing medical conditions that are more common for people who are older than age 16. Falls are a major cause of broken bones and head injuries in people who are older than age 57. Take precautions to prevent a fall at home. Work with your health care provider to learn what changes you can make to improve your health and wellness and to prevent falls. This information is not intended to replace advice given to you by your health care provider. Make sure you discuss any questions you have with your health care provider. Document Revised: 01/07/2021 Document Reviewed: 01/07/2021 Elsevier Patient Education  [place], MD [facility]

## 2022-09-17 NOTE — Patient Instructions (Signed)
Health Maintenance After Age 77 After age 77, you are at a higher risk for certain long-term diseases and infections as well as injuries from falls. Falls are a major cause of broken bones and head injuries in people who are older than age 77. Getting regular preventive care can help to keep you healthy and well. Preventive care includes getting regular testing and making lifestyle changes as recommended by your health care provider. Talk with your health care provider about: Which screenings and tests you should have. A screening is a test that checks for a disease when you have no symptoms. A diet and exercise plan that is right for you. What should I know about screenings and tests to prevent falls? Screening and testing are the best ways to find a health problem early. Early diagnosis and treatment give you the best chance of managing medical conditions that are common after age 77. Certain conditions and lifestyle choices may make you more likely to have a fall. Your health care provider may recommend: Regular vision checks. Poor vision and conditions such as cataracts can make you more likely to have a fall. If you wear glasses, make sure to get your prescription updated if your vision changes. Medicine review. Work with your health care provider to regularly review all of the medicines you are taking, including over-the-counter medicines. Ask your health care provider about any side effects that may make you more likely to have a fall. Tell your health care provider if any medicines that you take make you feel dizzy or sleepy. Strength and balance checks. Your health care provider may recommend certain tests to check your strength and balance while standing, walking, or changing positions. Foot health exam. Foot pain and numbness, as well as not wearing proper footwear, can make you more likely to have a fall. Screenings, including: Osteoporosis screening. Osteoporosis is a condition that causes  the bones to get weaker and break more easily. Blood pressure screening. Blood pressure changes and medicines to control blood pressure can make you feel dizzy. Depression screening. You may be more likely to have a fall if you have a fear of falling, feel depressed, or feel unable to do activities that you used to do. Alcohol use screening. Using too much alcohol can affect your balance and may make you more likely to have a fall. Follow these instructions at home: Lifestyle Do not drink alcohol if: Your health care provider tells you not to drink. If you drink alcohol: Limit how much you have to: 0-1 drink a day for women. 0-2 drinks a day for men. Know how much alcohol is in your drink. In the [place] one drink equals one 12 oz bottle of beer (355 mL), one 5 oz glass of wine (148 mL), or one 1 oz glass of hard liquor (44 mL). Do not use any products that contain nicotine or tobacco. These products include cigarettes, chewing tobacco, and vaping devices, such as e-cigarettes. If you need help quitting, ask your health care provider. Activity  Follow a regular exercise program to stay fit. This will help you maintain your balance. Ask your health care provider what types of exercise are appropriate for you. If you need a cane or walker, use it as recommended by your health care provider. Wear supportive shoes that have nonskid soles. Safety  Remove any tripping hazards, such as rugs, cords, and clutter. Install safety equipment such as grab bars in bathrooms and safety rails on stairs. Keep rooms and walkways   well-lit. General instructions Talk with your health care provider about your risks for falling. Tell your health care provider if: You fall. Be sure to tell your health care provider about all falls, even ones that seem minor. You feel dizzy, tiredness (fatigue), or off-balance. Take over-the-counter and prescription medicines only as told by your health care provider. These include  supplements. Eat a healthy diet and maintain a healthy weight. A healthy diet includes low-fat dairy products, low-fat (lean) meats, and fiber from whole grains, beans, and lots of fruits and vegetables. Stay current with your vaccines. Schedule regular health, dental, and eye exams. Summary Having a healthy lifestyle and getting preventive care can help to protect your health and wellness after age 77. Screening and testing are the best way to find a health problem early and help you avoid having a fall. Early diagnosis and treatment give you the best chance for managing medical conditions that are more common for people who are older than age 77. Falls are a major cause of broken bones and head injuries in people who are older than age 77. Take precautions to prevent a fall at home. Work with your health care provider to learn what changes you can make to improve your health and wellness and to prevent falls. This information is not intended to replace advice given to you by your health care provider. Make sure you discuss any questions you have with your health care provider. Document Revised: 01/07/2021 Document Reviewed: 01/07/2021 Elsevier Patient Education  2023 Elsevier Inc.  

## 2022-09-17 NOTE — Assessment & Plan Note (Signed)
Stable.  Diet and nutrition discussed.  Continue rosuvastatin 10 mg daily.  

## 2022-09-17 NOTE — Assessment & Plan Note (Signed)
Stable.  Using dental appliance.  No CPAP.

## 2022-09-17 NOTE — Assessment & Plan Note (Signed)
Active and affecting quality of life.  Presently taking Flomax 0.4 mg twice a day Recommend to start Gemtesa 75 mg daily.

## 2022-09-17 NOTE — Telephone Encounter (Signed)
Called to confirm participation in [facility] class at [facility] on 09/29/22, assessment visit scheduled for 1/24 at 11am

## 2022-09-17 NOTE — Assessment & Plan Note (Signed)
Stable.  Well-controlled.  Continue Prevacid 30 mg daily.

## 2022-09-17 NOTE — Assessment & Plan Note (Signed)
Active and affecting quality of life. Continue Flomax and start Gemtesa 75 mg daily.

## 2022-09-17 NOTE — Assessment & Plan Note (Signed)
Well-controlled.  Continue trazodone 50 mg at bedtime

## 2022-09-17 NOTE — Assessment & Plan Note (Signed)
Well-controlled hypertension. Continue Diovan HCT 80-12.5 mg daily. Cardiovascular risk associated with hypertension discussed Dietary approaches to stop hypertension discussed.

## 2022-09-18 ENCOUNTER — Other Ambulatory Visit: Payer: Self-pay | Admitting: Podiatry

## 2022-09-18 ENCOUNTER — Telehealth: Payer: Self-pay | Admitting: Internal Medicine

## 2022-09-18 ENCOUNTER — Ambulatory Visit: Payer: Medicare PPO | Admitting: Cardiology

## 2022-09-18 ENCOUNTER — Other Ambulatory Visit: Payer: Self-pay | Admitting: Emergency Medicine

## 2022-09-18 DIAGNOSIS — K2270 Barrett's esophagus without dysplasia: Secondary | ICD-10-CM

## 2022-09-18 DIAGNOSIS — K219 Gastro-esophageal reflux disease without esophagitis: Secondary | ICD-10-CM

## 2022-09-18 DIAGNOSIS — J42 Unspecified chronic bronchitis: Secondary | ICD-10-CM

## 2022-09-18 NOTE — Telephone Encounter (Signed)
Patient is calling wishing for a refill on his Lansoprazole. [facility] on [address]. Please advise.

## 2022-09-19 MED ORDER — LANSOPRAZOLE 30 MG PO CPDR: 30.0000 mg | DELAYED_RELEASE_CAPSULE | Freq: Two times a day (BID) | ORAL | 1 refills | Status: DC

## 2022-09-19 NOTE — Telephone Encounter (Signed)
Prevacid refilled 

## 2022-09-24 NOTE — Progress Notes (Signed)
[facility] PREP Evaluation  Patient Details  Name: Gregg Smith MRN: [id number] Date of Birth: 12-21-1945 Age: 77 y.o. PCP: Horald Pollen, MD  Vitals:   09/24/22 1137  BP: 128/76  Pulse: 72  SpO2: 96%  Weight: 266 lb 12.8 oz (121 kg)     [facility] Eval - 09/24/22 1100       [facility] "PREP" Location   [facility] "PRE[facility]" Location [facility]      Referral    Referring Provider Gardiner Rhyme    Reason for referral High Cholesterol;Hypertension;Inactivity;Obesitity/Overweight    Program Start Date 09/29/22      Measurement   Neck measurement 18 Inches    Waist Circumference 52.5 inches    Body fat 44 percent      Information for Trainer   Goals --   Lost 10 pounds by end of program; establish exercise routine   Current Exercise --   working on 6 acre property   Orthopedic Concerns --   back pain, sciatica   Pertinent Medical History --   HTN, OSA, s/p L TKA, DJD spine, sciatica     Timed Up and Go (TUGS)   Timed Up and Go Low risk <9 seconds      Mobility and Daily Activities   I find it easy to walk up or down two or more flights of stairs. 3    I have no trouble taking out the trash. 4    I do housework such as vacuuming and dusting on my own without difficulty. 4    I can easily lift a gallon of milk (8lbs). 4    I can easily walk a mile. 2    I have no trouble reaching into high cupboards or reaching down to pick up something from the floor. 4    I do not have trouble doing out-door work such as Armed forces logistics/support/administrative officer, raking leaves, or gardening. 4      Mobility and Daily Activities   I feel younger than my age. 4    I feel independent. 2    I feel energetic. 2    I live an active life.  2    I feel strong. 2    I feel healthy. 2    I feel active as other people my age. 4      How fit and strong are you.   Fit and Strong Total Score 43            Past Medical History:  Diagnosis Date   Allergy    seasonal    Anxiety    Arthritis    Barrett's esophagus     Cataract    Chicken pox    Colon polyps    hyperplastic   Diverticulosis    GERD (gastroesophageal reflux disease)    Hemorrhoids    Hyperlipidemia    Hypertension    Leg cramps    Measles    Mumps    Obstructive sleep apnea    Pneumonia    Sleep apnea    dental device   Past Surgical History:  Procedure Laterality Date   APPENDECTOMY  1957   CATARACT EXTRACTION     COLONOSCOPY     JOINT REPLACEMENT     TONSILLECTOMY     TONSILLECTOMY AND ADENOIDECTOMY  1954   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   Procedure: LEFT TOTAL KNEE ARTHROPLASTY;  Surgeon: Paralee Cancel, MD;  Location: [facility];  Service: Orthopedics;  Laterality: Left;  UPPER GASTROINTESTINAL ENDOSCOPY     Social History   Tobacco Use  Smoking Status Former   Packs/day: 1.00   Years: 40.00   Total pack years: 40.00   Types: Cigarettes   Quit date: 01/19/2007   Years since quitting: 15.6  Smokeless Tobacco Never    Demontrae Gilbert B Deke Tilghman 09/24/2022, 11:40 AM

## 2022-09-29 NOTE — Progress Notes (Signed)
[facility] PREP Weekly Session  Patient Details  Name: Gregg Smith MRN: [id number] Date of Birth: Dec 12, 1945 Age: 77 y.o. PCP: Horald Pollen, MD  There were no vitals filed for this visit.   [facility] Weekly seesion - 09/29/22 1400       [facility] "PREP" Location   [facility] "PREP" Location [facility]      Weekly Session   Topic Discussed Goal setting and welcome to the program   Introductions, review of notebook, tour of facility; offered option for short cardio workout   Classes attended to date [place] 09/29/2022, 2:56 PM

## 2022-10-06 NOTE — Progress Notes (Signed)
[facility] PREP Weekly Session  Patient Details  Name: YOAN SALLADE MRN: [id number] Date of Birth: 12/10/1945 Age: 77 y.o. PCP: Horald Pollen, MD  Vitals:   10/06/22 1358  Weight: 264 lb (119.7 kg)     [facility] Weekly seesion - 10/06/22 1300       [facility] "PREP" Location   [facility] "PREP[facility] Location [facility]      Weekly Session   Topic Discussed Importance of resistance training;Other ways to be active   Sitting no more than 30 minutes; work up to 150 cardio/week, strength training, 2-3 times/wk for 20-40 minutes   Minutes exercised this week 45 minutes    Classes attended to date [place] 10/06/2022, 1:59 PM

## 2022-10-08 DIAGNOSIS — F411 Generalized anxiety disorder: Secondary | ICD-10-CM | POA: Diagnosis not present

## 2022-10-08 DIAGNOSIS — G4700 Insomnia, unspecified: Secondary | ICD-10-CM | POA: Diagnosis not present

## 2022-10-13 NOTE — Progress Notes (Signed)
[facility] PREP Weekly Session  Patient Details  Name: Gregg Smith MRN: [id number] Date of Birth: Mar 03, 1946 Age: 77 y.o. PCP: Horald Pollen, MD  Vitals:   10/13/22 1350  Weight: 259 lb (117.5 kg)     [facility] Weekly seesion - 10/13/22 1300       [facility] "PREP" Location   [facility]PREP[facility] Location [facility]      Weekly Session   Topic Discussed Healthy eating tips   introduced YUKA app; foods to reduce, foods to increase; eat the rainbow of colors   Classes attended to date [place] 10/13/2022, 1:51 PM

## 2022-10-20 NOTE — Progress Notes (Signed)
[facility] PRE[facility] Weekly Session  Patient Details  Name: Gregg Smith MRN: [id number] Date of Birth: [birth year] Age: 77 y.o. PCP: Horald Pollen, MD  Vitals:   10/20/22 1459  Weight: 260 lb (117.9 kg)     [facility] Weekly seesion - 10/20/22 1500       [facility] "PREP" Location   [facility]PREP[facility] Location [facility]      Weekly Session   Topic Discussed Health habits;Water   Added sugars: Men: 36 gm, women: 24gm; sugar demo   Minutes exercised this week 105 minutes    Classes attended to date [place] 10/20/2022, 3:01 PM

## 2022-10-27 NOTE — Progress Notes (Signed)
[facility] PREP Weekly Session  Patient Details  Name: Gregg Smith MRN: [id number] Date of Birth: 1946-04-03 Age: 77 y.o. PCP: Horald Pollen, MD  Vitals:   10/27/22 1350  Weight: 258 lb (117 kg)     [facility] Weekly seesion - 10/27/22 1300       [facility] "PREP" Location   [facility]PREP[facility] Location [facility]      Weekly Session   Topic Discussed Restaurant Eating   Salt intake: 1500-'2300mg'$ /day; salt demo; low sodium flavoring tips   Minutes exercised this week 115 minutes    Classes attended to date [place] 10/27/2022, 1:51 PM

## 2022-10-29 ENCOUNTER — Encounter: Payer: Self-pay | Admitting: Podiatry

## 2022-11-03 DIAGNOSIS — F411 Generalized anxiety disorder: Secondary | ICD-10-CM | POA: Diagnosis not present

## 2022-11-03 DIAGNOSIS — F4329 Adjustment disorder with other symptoms: Secondary | ICD-10-CM | POA: Diagnosis not present

## 2022-11-03 NOTE — Progress Notes (Signed)
[facility] PREP Weekly Session  Patient Details  Name: Gregg Smith MRN: [id number] Date of Birth: 08/19/1946 Age: 77 y.o. PCP: Horald Pollen, MD  Vitals:   11/03/22 1346  Weight: 259 lb (117.5 kg)     [facility] Weekly seesion - 11/03/22 1300       [facility] "PREP" Location   [facility] "PREP[facility] Location [facility]      Weekly Session   Topic Discussed Stress management and problem solving   importance of sleep; shared finger tip mudra breathing practice   Minutes exercised this week 120 minutes    Classes attended to date [place] 11/03/2022, 1:47 PM

## 2022-11-04 DIAGNOSIS — G4700 Insomnia, unspecified: Secondary | ICD-10-CM | POA: Diagnosis not present

## 2022-11-04 DIAGNOSIS — F411 Generalized anxiety disorder: Secondary | ICD-10-CM | POA: Diagnosis not present

## 2022-11-06 ENCOUNTER — Ambulatory Visit: Payer: Medicare PPO | Admitting: Podiatry

## 2022-11-06 DIAGNOSIS — B351 Tinea unguium: Secondary | ICD-10-CM

## 2022-11-06 DIAGNOSIS — M79675 Pain in left toe(s): Secondary | ICD-10-CM | POA: Diagnosis not present

## 2022-11-06 DIAGNOSIS — M79674 Pain in right toe(s): Secondary | ICD-10-CM | POA: Diagnosis not present

## 2022-11-10 ENCOUNTER — Encounter: Payer: Self-pay | Admitting: Podiatry

## 2022-11-10 NOTE — Progress Notes (Signed)
  Subjective:  Patient ID: Gregg Smith, male    DOB: 06/20/1946,  MRN: [id number]  Chief Complaint  Patient presents with   Nail Problem    Thick painful toenails, 3 month follow up    77 y.o. male presents with the above complaint. History confirmed with patient.  The skin itching is doing better.  Nails are thickened elongated causing discomfort again  Objective:  Physical Exam: warm, good capillary refill, no trophic changes or ulcerative lesions, normal DP and PT pulses and normal sensory exam.  Nails with thickened elongated dystrophy subungual debris and yellow discoloration x 10.  Interdigital tinea has improved   [place] pathology results with onychomycosis       Assessment:   1. Pain due to onychomycosis of toenails of both feet      Plan:  Patient was evaluated and treated and all questions answered.  Discussed the etiology and treatment options for the condition in detail with the patient.Recommended debridement of the nails today. Sharp and mechanical debridement performed of all painful and mycotic nails today. Nails debrided in length and thickness using a nail nipper to level of comfort.        Return in about 3 months (around 02/06/2023) for painful thick fungal nails.

## 2022-11-17 NOTE — Progress Notes (Signed)
[facility] PREP Weekly Session  Patient Details  Name: Gregg Smith MRN: [id number] Date of Birth: 10-04-1945 Age: 77 y.o. PCP: Horald Pollen, MD  Vitals:   11/17/22 1407  Weight: 255 lb (115.7 kg)     [facility] Weekly seesion - 11/17/22 1400       [facility] "PREP" Location   [facility] "PREP" Location [facility]      Weekly Session   Topic Discussed Other   Portion control, visualize your portion size demo; review of [employer]isins 50% Red Sugar food label   Classes attended to date [place] 11/17/2022, 2:07 PM

## 2022-11-24 NOTE — Progress Notes (Signed)
[facility] PREP Weekly Session  Patient Details  Name: Gregg Smith MRN: [id number] Date of Birth: 1946-07-04 Age: 77 y.o. PCP: Horald Pollen, MD  Vitals:   11/24/22 1406  Weight: 255 lb (115.7 kg)     [facility] Weekly seesion - 11/24/22 1400       [facility] "PREP" Location   [facility] "PREP[facility] Location [facility]      Weekly Session   Topic Discussed Finding support   Review of food labels from home   Minutes exercised this week 40 minutes    Classes attended to date [place] 11/24/2022, 2:08 PM

## 2022-11-27 ENCOUNTER — Encounter (HOSPITAL_COMMUNITY): Payer: Self-pay

## 2022-11-27 ENCOUNTER — Ambulatory Visit (HOSPITAL_COMMUNITY)
Admission: EM | Admit: 2022-11-27 | Discharge: 2022-11-27 | Disposition: A | Discharge status: Home / Self Care | Payer: Medicare PPO | Attending: Family Medicine | Admitting: Family Medicine

## 2022-11-27 ENCOUNTER — Ambulatory Visit (INDEPENDENT_AMBULATORY_CARE_PROVIDER_SITE_OTHER): Payer: Medicare PPO

## 2022-11-27 DIAGNOSIS — R062 Wheezing: Secondary | ICD-10-CM | POA: Diagnosis not present

## 2022-11-27 DIAGNOSIS — R059 Cough, unspecified: Secondary | ICD-10-CM

## 2022-11-27 DIAGNOSIS — R053 Chronic cough: Secondary | ICD-10-CM

## 2022-11-27 MED ORDER — BENZONATATE 100 MG PO CAPS: ORAL_CAPSULE | ORAL | 0 refills | Status: DC

## 2022-11-27 MED ORDER — AZITHROMYCIN 250 MG PO TABS: 250.0000 mg | ORAL_TABLET | Freq: Every day | ORAL | 0 refills | Status: DC

## 2022-11-27 MED ORDER — PREDNISONE 20 MG PO TABS: 40.0000 mg | ORAL_TABLET | Freq: Every day | ORAL | 0 refills | Status: DC

## 2022-11-27 NOTE — ED Provider Notes (Signed)
[place]   [id number] 11/27/22 Arrival Time: [id number]  ASSESSMENT & PLAN:  1. Persistent cough for 3 weeks or longer   2. Wheezing    No respiratory distress.  I have personally viewed and independently interpreted the imaging studies ordered this visit. No acute changes on CXR. No infiltrates appreciated.  Given duration of coughing with wheezing on exam, will treat as below. Begin: Meds ordered this encounter  Medications   predniSONE (DELTASONE) 20 MG tablet    Sig: Take 2 tablets (40 mg total) by mouth daily.    Dispense:  10 tablet    Refill:  0   benzonatate (TESSALON) 100 MG capsule    Sig: Take 1 capsule by mouth every 8 (eight) hours for cough.    Dispense:  21 capsule    Refill:  0   azithromycin (ZITHROMAX) 250 MG tablet    Sig: Take 1 tablet (250 mg total) by mouth daily. Take first 2 tablets together, then 1 every day until finished.    Dispense:  6 tablet    Refill:  0   OTC symptom care as needed.  Recommend:  Follow-up Information     Schedule an appointment as soon as possible for a visit  with Horald Pollen, MD.   Specialty: Internal Medicine Why: For follow up. Contact information: [place] [address] [phone]                 Reviewed expectations re: course of current medical issues. Questions answered. Outlined signs and symptoms indicating need for more acute intervention. Patient verbalized understanding. After Visit Summary given.  SUBJECTIVE: History from: patient.  Gregg Smith is a 77 y.o. male who presents with complaint of persistent non-prod cough; x 3 weeks, maybe longer. Reports h/o PNA. "Feel very tired." Denies fever/CP/SOB. Does feel he is wheezing at times. Normal PO intake without n/v/d.  Social History   Tobacco Use  Smoking Status Former   Packs/day: 1.00   Years: 40.00   Additional pack years: 0.00   Total pack years: 40.00   Types: Cigarettes   Quit date:  01/19/2007   Years since quitting: 15.8  Smokeless Tobacco Never     OBJECTIVE:  Vitals:   11/27/22 1456  BP: 129/71  Pulse: 75  Resp: 19  Temp: 98.4 F (36.9 C)  SpO2: 95%    General appearance: alert; NAD HEENT: [place]; AT; without nasal congestion Neck: supple without LAD Cv: RRR without murmer Lungs: unlabored respirations,  mild  bilateral expiratory wheezing; cough: dry; no significant respiratory distress Skin: warm and dry Psychological: alert and cooperative; normal mood and affect  Imaging: DG Chest 2 View  Result Date: 11/27/2022 CLINICAL DATA:  Cough for 3 weeks, history pneumonia EXAM: CHEST - 2 VIEW COMPARISON:  01/11/2020 FINDINGS: Normal heart size, mediastinal contours, and pulmonary vascularity. Mild bronchitic changes without pulmonary infiltrate, pleural effusion, or pneumothorax. No osseous abnormalities. IMPRESSION: Bronchitic changes without infiltrate. Electronically Signed   By: Lavonia Dana M.D.   On: 11/27/2022 15:12    Allergies  Allergen Reactions   Penicillins Hives and Rash    Has patient had a PCN reaction causing immediate rash, facial/tongue/throat swelling, SOB or lightheadedness with hypotension:unsure Has patient had a PCN reaction causing severe rash involving mucus membranes or skin necrosis:unsure Has patient had a PCN reaction that required hospitalization:No Has patient had a PCN reaction occurring within the last 10 years:No If all of the above answers are "NO", then  may proceed with Cephalosporin use. Childhood reaction     Past Medical History:  Diagnosis Date   Allergy    seasonal    Anxiety    Arthritis    Barrett's esophagus    Cataract    Chicken pox    Colon polyps    hyperplastic   Diverticulosis    GERD (gastroesophageal reflux disease)    Hemorrhoids    Hyperlipidemia    Hypertension    Leg cramps    Measles    Mumps    Obstructive sleep apnea    Pneumonia    Sleep apnea    dental device   Family History   Problem Relation Age of Onset   Alzheimer's disease Mother    Cancer Father    Stomach cancer Father    Aortic aneurysm Son    Heart disease Son    Cancer Maternal Grandmother    Heart disease Maternal Grandfather    Stomach cancer Paternal Grandfather    Colon cancer Neg Hx    Colon polyps Neg Hx    Esophageal cancer Neg Hx    Rectal cancer Neg Hx    Pancreatic cancer Neg Hx    Liver cancer Neg Hx    Social History   Socioeconomic History   Marital status: Widowed    Spouse name: [name]   Number of children: 1   Years of education: PHD   Highest education level: Not on file  Occupational History    Comment: Professor,[employer] Education administrator: [employer]  Tobacco Use   Smoking status: Former    Packs/day: 1.00    Years: 40.00    Additional pack years: 0.00    Total pack years: 40.00    Types: Cigarettes    Quit date: 01/19/2007    Years since quitting: 15.8   Smokeless tobacco: Never  Vaping Use   Vaping Use: Never used  Substance and Sexual Activity   Alcohol use: Yes    Alcohol/week: 2.0 standard drinks of alcohol    Types: 2 Cans of beer per week   Drug use: No   Sexual activity: Yes    Birth control/protection: None  Other Topics Concern   Not on file  Social History Narrative   Patient lives alone in his home   Patient is retired   Patient has a Ph.D   Patient has one adult child.   Patient is right-handed.   Patient drinks 1 cup of tea and 1 cup of coffee daily    Social Determinants of Health   Financial Resource Strain: Not on file  Food Insecurity: Not on file  Transportation Needs: Not on file  Physical Activity: Not on file  Stress: Not on file  Social Connections: Not on file  Intimate Partner Violence: Not on file             Vanessa Kick, MD 11/27/22 1640

## 2022-11-27 NOTE — ED Triage Notes (Signed)
Pt presents with concern for pneumonia. Pt has been coughing for 3 weeks. Hx of pneumonia and this feels the same.

## 2022-12-01 ENCOUNTER — Telehealth: Payer: Medicare PPO | Admitting: Internal Medicine

## 2022-12-01 DIAGNOSIS — G4700 Insomnia, unspecified: Secondary | ICD-10-CM | POA: Diagnosis not present

## 2022-12-01 DIAGNOSIS — F411 Generalized anxiety disorder: Secondary | ICD-10-CM | POA: Diagnosis not present

## 2022-12-01 NOTE — Progress Notes (Signed)
[facility] PREP Weekly Session  Patient Details  Name: Gregg Smith MRN: [id number] Date of Birth: [birth year] Age: 77 y.o. PCP: Horald Pollen, MD  Vitals:   12/01/22 1345  Weight: 254 lb (115.2 kg)     [facility] Weekly seesion - 12/01/22 1300       [facility] "PREP" Location   [facility] "PREP" Location [facility]      Weekly Session   Topic Discussed Calorie breakdown   Carbohydrates, fats, proteins, simple vs. complex carbs; [facility] membership talk w/Alyssa   Minutes exercised this week 60 minutes    Classes attended to date [place] 12/01/2022, 1:45 PM

## 2022-12-08 NOTE — Progress Notes (Signed)
[facility] PREP Weekly Session  Patient Details  Name: Gregg Smith MRN: [id number] Date of Birth: September 17, 1945 Age: 77 y.o. PCP: Georgina Quint, MD  Vitals:   12/08/22 1341  Weight: 252 lb (114.3 kg)     [facility] Weekly seesion - 12/08/22 1300       [facility] "PREP" Location   [facility] "PREP" Location [facility]      Weekly Session   Topic Discussed Hitting roadblocks   Review of goals and activity plan and program survey for final assessment visit   Minutes exercised this week 120 minutes    Classes attended to date 49             Madex Seals B Kaytlynne Neace 12/08/2022, 1:42 PM

## 2022-12-15 NOTE — Progress Notes (Signed)
[facility] PREP Weekly Session  Patient Details  Name: Gregg Smith MRN: [id number] Date of Birth: Sep 01, 1946 Age: 77 y.o. PCP: Georgina Quint, MD  Vitals:   12/15/22 1439  Weight: 250 lb (113.4 kg)     [facility] Weekly seesion - 12/15/22 1400       [facility] "PREP" Location   [facility] "PREP" Location [facility]      Weekly Session   Topic Discussed Other   How fit and strong survey and Fit testing completed; final assessment visit appts scheduled for Wednesday/Thursday   Minutes exercised this week 120 minutes    Classes attended to date 40             [name] 12/15/2022, 2:40 PM

## 2022-12-17 ENCOUNTER — Other Ambulatory Visit: Payer: Self-pay | Admitting: Emergency Medicine

## 2022-12-17 ENCOUNTER — Other Ambulatory Visit: Payer: Self-pay | Admitting: Internal Medicine

## 2022-12-17 DIAGNOSIS — K219 Gastro-esophageal reflux disease without esophagitis: Secondary | ICD-10-CM

## 2022-12-17 DIAGNOSIS — R399 Unspecified symptoms and signs involving the genitourinary system: Secondary | ICD-10-CM

## 2022-12-17 DIAGNOSIS — K2270 Barrett's esophagus without dysplasia: Secondary | ICD-10-CM

## 2022-12-17 DIAGNOSIS — N3281 Overactive bladder: Secondary | ICD-10-CM

## 2022-12-17 DIAGNOSIS — E785 Hyperlipidemia, unspecified: Secondary | ICD-10-CM

## 2022-12-17 NOTE — Progress Notes (Signed)
[facility] PREP Evaluation  Patient Details  Name: Gregg Smith MRN: [id number] Date of Birth: 1946/04/28 Age: 77 y.o. PCP: Georgina Quint, MD  Vitals:   12/17/22 1216  BP: 118/70  Pulse: 82  SpO2: 96%  Weight: 250 lb (113.4 kg)     [facility] Eval - 12/17/22 1200       [facility] "PREP" Location   [facility] "PREP" Location [facility]      Referral    Program End Date 12/17/22      Measurement   Neck measurement 18 Inches    Hip Circumference End Program 50 inches    Body fat 35.2 percent      Mobility and Daily Activities   I find it easy to walk up or down two or more flights of stairs. 4    I have no trouble taking out the trash. 4    I do housework such as vacuuming and dusting on my own without difficulty. 4    I can easily lift a gallon of milk (8lbs). 4    I can easily walk a mile. 4    I have no trouble reaching into high cupboards or reaching down to pick up something from the floor. 4    I do not have trouble doing out-door work such as Loss adjuster, chartered, raking leaves, or gardening. 4      Mobility and Daily Activities   I feel younger than my age. 3    I feel independent. 3    I feel energetic. 3    I live an active life.  4    I feel strong. 4    I feel healthy. 4    I feel active as other people my age. 4      How fit and strong are you.   Fit and Strong Total Score 53            Past Medical History:  Diagnosis Date   Allergy    seasonal    Anxiety    Arthritis    Barrett's esophagus    Cataract    Chicken pox    Colon polyps    hyperplastic   Diverticulosis    GERD (gastroesophageal reflux disease)    Hemorrhoids    Hyperlipidemia    Hypertension    Leg cramps    Measles    Mumps    Obstructive sleep apnea    Pneumonia    Sleep apnea    dental device   Past Surgical History:  Procedure Laterality Date   APPENDECTOMY  1957   CATARACT EXTRACTION     COLONOSCOPY     JOINT REPLACEMENT     TONSILLECTOMY     TONSILLECTOMY  AND ADENOIDECTOMY  1954   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   Procedure: LEFT TOTAL KNEE ARTHROPLASTY;  Surgeon: Durene Romans, MD;  Location: [facility];  Service: Orthopedics;  Laterality: Left;   UPPER GASTROINTESTINAL ENDOSCOPY     Social History   Tobacco Use  Smoking Status Former   Packs/day: 1.00   Years: 40.00   Additional pack years: 0.00   Total pack years: 40.00   Types: Cigarettes   Quit date: 01/19/2007   Years since quitting: 15.9  Smokeless Tobacco Never  Wt loss: 16.8 Inches lost: 2.5 How fit and strong survey:  09/24/22: 43 12/15/22:53 BP:  09/24/22: 128/76 12/17/22: 118/70 Education sessions attended: 11 Workout sessions completed: 12  Gregg Smith Gregg Smith 12/17/2022, 12:18 PM

## 2022-12-22 DIAGNOSIS — F411 Generalized anxiety disorder: Secondary | ICD-10-CM | POA: Diagnosis not present

## 2022-12-29 DIAGNOSIS — F411 Generalized anxiety disorder: Secondary | ICD-10-CM | POA: Diagnosis not present

## 2022-12-29 DIAGNOSIS — G4700 Insomnia, unspecified: Secondary | ICD-10-CM | POA: Diagnosis not present

## 2023-01-21 ENCOUNTER — Ambulatory Visit (INDEPENDENT_AMBULATORY_CARE_PROVIDER_SITE_OTHER): Payer: Medicare PPO

## 2023-01-21 ENCOUNTER — Ambulatory Visit (HOSPITAL_COMMUNITY)
Admission: EM | Admit: 2023-01-21 | Discharge: 2023-01-21 | Disposition: A | Discharge status: Home / Self Care | Payer: Medicare PPO

## 2023-01-21 ENCOUNTER — Encounter (HOSPITAL_COMMUNITY): Payer: Self-pay | Admitting: *Deleted

## 2023-01-21 ENCOUNTER — Other Ambulatory Visit: Payer: Self-pay

## 2023-01-21 DIAGNOSIS — R051 Acute cough: Secondary | ICD-10-CM

## 2023-01-21 DIAGNOSIS — R0602 Shortness of breath: Secondary | ICD-10-CM | POA: Diagnosis not present

## 2023-01-21 DIAGNOSIS — J029 Acute pharyngitis, unspecified: Secondary | ICD-10-CM | POA: Diagnosis not present

## 2023-01-21 DIAGNOSIS — J069 Acute upper respiratory infection, unspecified: Secondary | ICD-10-CM

## 2023-01-21 DIAGNOSIS — R059 Cough, unspecified: Secondary | ICD-10-CM | POA: Diagnosis not present

## 2023-01-21 HISTORY — DX: Insomnia, unspecified: G47.00

## 2023-01-21 MED ORDER — PREDNISONE 20 MG PO TABS: 40.0000 mg | ORAL_TABLET | Freq: Every day | ORAL | 0 refills | Status: AC

## 2023-01-21 MED ORDER — BENZONATATE 200 MG PO CAPS: 200.0000 mg | ORAL_CAPSULE | Freq: Three times a day (TID) | ORAL | 0 refills | Status: DC | PRN

## 2023-01-21 NOTE — ED Provider Notes (Signed)
[facility]    CSN: [id number] Arrival date & time: 01/21/23  1042      History   Chief Complaint Chief Complaint  Patient presents with   Cough    HPI Gregg Smith is a 77 y.o. male.   Patient presents to clinic for complaint of a cough that started yesterday.  Initially it was dry, and has progressed to being productive with clear phlegm today.  He reports shortness of breath with exertion like walking upstairs. Unsure if fever, had cold chills last night. Nasal drainage and sore throat. Denies wheezing or chest pain.    The history is provided by the patient and medical records.  Cough Associated symptoms: chills, shortness of breath and sore throat   Associated symptoms: no chest pain and no wheezing     Past Medical History:  Diagnosis Date   Allergy    seasonal    Anxiety    Arthritis    Barrett's esophagus    Cataract    Chicken pox    Colon polyps    hyperplastic   Diverticulosis    GERD (gastroesophageal reflux disease)    Hemorrhoids    Hyperlipidemia    Hypertension    Insomnia    Leg cramps    Measles    Mumps    Obstructive sleep apnea    Pneumonia    Sleep apnea    dental device    Patient Active Problem List   Diagnosis Date Noted   Overactive bladder 09/17/2022   Lower urinary tract symptoms 03/17/2022   Aortic atherosclerosis (HCC) 03/17/2022   Loud snoring 10/23/2021   Acute insomnia 10/23/2021   Chronic left-sided low back pain with left-sided sciatica 03/05/2021   Osteoarthritis of knee 01/31/2021   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   Scoliosis deformity of spine 04/22/2019   Generalized anxiety disorder    Panic disorder    Insomnia    Spinal stenosis of lumbar region 11/30/2018   Dental plaque 11/02/2018   GERD (gastroesophageal reflux disease) 08/14/2017   S/P left TKA 08/11/2016   S/P knee replacement 08/11/2016   OSA (obstructive sleep apnea) 03/22/2014   Snoring disorder  05/09/2013   Essential hypertension 02/11/2013   Dyslipidemia 02/11/2013   Arthritis 02/11/2013    Past Surgical History:  Procedure Laterality Date   APPENDECTOMY  1957   CATARACT EXTRACTION     COLONOSCOPY     JOINT REPLACEMENT     TONSILLECTOMY     TONSILLECTOMY AND ADENOIDECTOMY  1954   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   Procedure: LEFT TOTAL KNEE ARTHROPLASTY;  Surgeon: Durene Romans, MD;  Location: [facility];  Service: Orthopedics;  Laterality: Left;   UPPER GASTROINTESTINAL ENDOSCOPY         Home Medications    Prior to Admission medications   Medication Sig Start Date End Date Taking? Authorizing Provider  benzonatate (TESSALON) 200 MG capsule Take 1 capsule (200 mg total) by mouth 3 (three) times daily as needed for cough. 01/21/23  Yes Rinaldo Ratel, [place] N, FNP  GEMTESA 75 MG TABS TAKE 75 MG BY MOUTH DAILY. 12/17/22  Yes Sagardia, Eilleen Kempf, MD  lansoprazole (PREVACID) 30 MG capsule TAKE 1 CAPSULE (30 MG TOTAL) BY MOUTH 2 (TWO) TIMES DAILY BEFORE A MEAL. 12/17/22  Yes Hilarie Fredrickson, MD  Multiple Vitamin (MULTIVITAMIN) tablet Take 1 tablet by mouth daily.   Yes [provider]  predniSONE (DELTASONE) 20 MG tablet Take 2 tablets (40 mg total)  by mouth daily for 5 days. 01/21/23 01/26/23 Yes Rinaldo Ratel, [place] N, FNP  rosuvastatin (CRESTOR) 10 MG tablet TAKE 1 TABLET BY MOUTH EVERY DAY 12/17/22  Yes Sagardia, Eilleen Kempf, MD  SYMBICORT 80-4.5 MCG/ACT inhaler TAKE 2 PUFFS BY MOUTH TWICE A DAY 09/18/22  Yes Sagardia, Eilleen Kempf, MD  tamsulosin (FLOMAX) 0.4 MG CAPS capsule TAKE 1 CAPSULE BY MOUTH DAILY AT NIGHT. 03/05/21  Yes Sagardia, Eilleen Kempf, MD  UNKNOWN TO PATIENT "A med to help me sleep"   Yes [provider]  valsartan-hydrochlorothiazide (DIOVAN-HCT) 80-12.5 MG tablet TAKE 1 TABLET BY MOUTH EVERY DAY 08/20/22  Yes Sagardia, Eilleen Kempf, MD  azithromycin (ZITHROMAX) 250 MG tablet Take 1 tablet (250 mg total) by mouth daily. Take first 2 tablets together,  then 1 every day until finished. 11/27/22   Mardella Layman, MD  CLINPRO 5000 1.1 % PSTE Place onto teeth daily. 03/23/20   [provider]  traZODone (DESYREL) 50 MG tablet Take 1 tablet (50 mg total) by mouth at bedtime and may repeat dose one time if needed. 04/16/19   Oneta Rack, NP    Family History Family History  Problem Relation Age of Onset   Alzheimer's disease Mother    Cancer Father    Stomach cancer Father    Aortic aneurysm Son    Heart disease Son    Cancer Maternal Grandmother    Heart disease Maternal Grandfather    Stomach cancer Paternal Grandfather    Colon cancer Neg Hx    Colon polyps Neg Hx    Esophageal cancer Neg Hx    Rectal cancer Neg Hx    Pancreatic cancer Neg Hx    Liver cancer Neg Hx     Social History Social History   Tobacco Use   Smoking status: Former    Packs/day: 1.00    Years: 40.00    Additional pack years: 0.00    Total pack years: 40.00    Types: Cigarettes    Quit date: 01/19/2007    Years since quitting: 16.0   Smokeless tobacco: Never  Vaping Use   Vaping Use: Never used  Substance Use Topics   Alcohol use: Yes    Alcohol/week: 1.0 standard drink of alcohol    Types: 1 Cans of beer per week   Drug use: No     Allergies   Penicillins   Review of Systems Review of Systems  Constitutional:  Positive for chills.  HENT:  Positive for sore throat.   Respiratory:  Positive for cough and shortness of breath. Negative for wheezing.   Cardiovascular:  Negative for chest pain.  Gastrointestinal:  Negative for abdominal pain.     Physical Exam Triage Vital Signs ED Triage Vitals  Enc Vitals Group     BP 01/21/23 1157 128/71     Pulse Rate 01/21/23 1157 71     Resp 01/21/23 1157 (!) 28     Temp 01/21/23 1157 98.7 F (37.1 C)     Temp Source 01/21/23 1157 Oral     SpO2 01/21/23 1157 94 %     Weight --      Height --      Head Circumference --      Peak Flow --      Pain Score 01/21/23 1158 0     Pain  Loc --      Pain Edu? --      Excl. in GC? --    No data found.  Updated Vital Signs BP 128/71   Pulse 71   Temp 98.7 F (37.1 C) (Oral)   Resp (!) 28   SpO2 94%   Visual Acuity Right Eye Distance:   Left Eye Distance:   Bilateral Distance:    Right Eye Near:   Left Eye Near:    Bilateral Near:     Physical Exam Vitals and nursing note reviewed.  Constitutional:      Appearance: Normal appearance.  HENT:     Head: Normocephalic and atraumatic.     Right Ear: External ear normal.     Left Ear: External ear normal.     Nose: Nose normal.     Mouth/Throat:     Mouth: Mucous membranes are moist.  Eyes:     Conjunctiva/sclera: Conjunctivae normal.  Cardiovascular:     Rate and Rhythm: Normal rate and regular rhythm.     Heart sounds: Normal heart sounds. No murmur heard. Pulmonary:     Effort: Pulmonary effort is normal. No respiratory distress.     Breath sounds: Normal breath sounds.  Musculoskeletal:        General: No swelling. Normal range of motion.  Skin:    General: Skin is warm and dry.  Neurological:     General: No focal deficit present.     Mental Status: He is alert and oriented to person, place, and time.  Psychiatric:        Mood and Affect: Mood normal.        Behavior: Behavior normal.      UC Treatments / Results  Labs (all labs ordered are listed, but only abnormal results are displayed) Labs Reviewed - No data to display  EKG   Radiology DG Chest 2 View  Result Date: 01/21/2023 CLINICAL DATA:  Cough, sore throat, and shortness of breath. EXAM: CHEST - 2 VIEW COMPARISON:  Chest x-ray dated November 27, 2022. FINDINGS: The heart size and mediastinal contours are within normal limits. Normal pulmonary vascularity. Chronic mildly coarsened interstitial markings are similar to prior studies. No focal consolidation, pleural effusion, or pneumothorax. No acute osseous abnormality. IMPRESSION: No active cardiopulmonary disease. Electronically  Signed   By: Obie Dredge M.D.   On: 01/21/2023 12:44    Procedures Procedures (including critical care time)  Medications Ordered in UC Medications - No data to display  Initial Impression / Assessment and Plan / UC Course  I have reviewed the triage vital signs and the nursing notes.  Pertinent labs & imaging results that were available during my care of the patient were reviewed by me and considered in my medical decision making (see chart for details).  Vitals and triage reviewed, patient is hemodynamically stable.  Initially tachypneic on presentation.  Oxygenation stable.  Chest x-ray negative for pneumonia.  Discussed this is likely viral, will treat symptomatically and with steroids due to shortness of breath.  Lungs vesicular posteriorly.  Strict return and emergency precautions given, patient verbalized understanding, no questions at this time.     Final Clinical Impressions(s) / UC Diagnoses   Final diagnoses:  Upper respiratory tract infection, unspecified type  Acute cough     Discharge Instructions      Your x-rays were negative for any signs of pneumonia or infection.  At this point, it is likely that your upper respiratory infection is viral.  Please take Tylenol as needed for fevers.  You can take Tessalon Perles to help suppress your cough.  For sore throat I suggest warm  saline gargles, tea with honey and sleeping with a humidifier.  You can use Mucinex to help loosen up your nasal secretions, please ensure you are drinking at least 64 ounces of water daily.  Please do not hesitate to return to the clinic if you develop any new or concerning symptoms.     ED Prescriptions     Medication Sig Dispense Auth. Provider   benzonatate (TESSALON) 200 MG capsule Take 1 capsule (200 mg total) by mouth 3 (three) times daily as needed for cough. 20 capsule Rinaldo Ratel, [place] N, [place]   predniSONE (DELTASONE) 20 MG tablet Take 2 tablets (40 mg total) by mouth daily for 5  days. 10 tablet Jacee Enerson, [place] N, FNP      PDMP not reviewed this encounter.   Channing Yeager, [place] N, [place] 01/21/23 1256

## 2023-01-21 NOTE — Discharge Instructions (Addendum)
Your x-rays were negative for any signs of pneumonia or infection.  At this point, it is likely that your upper respiratory infection is viral.  Please take Tylenol as needed for fevers.  You can take Tessalon Perles to help suppress your cough.  For sore throat I suggest warm saline gargles, tea with honey and sleeping with a humidifier.  You can use Mucinex to help loosen up your nasal secretions, please ensure you are drinking at least 64 ounces of water daily.  Please do not hesitate to return to the clinic if you develop any new or concerning symptoms.

## 2023-01-21 NOTE — ED Triage Notes (Addendum)
C/O starting with dry cough yesterday morning, which has progressed into being occasionally productive. C/O general malaise and fatigue and "brain fog". Also c/o pain in the back of throat, extending down into chest. Unknown if fevers.  Has been taking Zinc, guaifenesin.

## 2023-01-25 ENCOUNTER — Encounter (HOSPITAL_COMMUNITY): Payer: Self-pay | Admitting: Emergency Medicine

## 2023-01-25 ENCOUNTER — Ambulatory Visit (HOSPITAL_COMMUNITY)
Admission: EM | Admit: 2023-01-25 | Discharge: 2023-01-25 | Disposition: A | Discharge status: Home / Self Care | Payer: Medicare PPO | Attending: Emergency Medicine | Admitting: Emergency Medicine

## 2023-01-25 ENCOUNTER — Other Ambulatory Visit: Payer: Self-pay

## 2023-01-25 DIAGNOSIS — J069 Acute upper respiratory infection, unspecified: Secondary | ICD-10-CM

## 2023-01-25 MED ORDER — AZITHROMYCIN 250 MG PO TABS: 250.0000 mg | ORAL_TABLET | Freq: Every day | ORAL | 0 refills | Status: DC

## 2023-01-25 MED ORDER — PROMETHAZINE-DM 6.25-15 MG/5ML PO SYRP: 5.0000 mL | ORAL_SOLUTION | Freq: Every evening | ORAL | 0 refills | Status: DC | PRN

## 2023-01-25 NOTE — ED Provider Notes (Signed)
[facility]    CSN: [id number] Arrival date & time: 01/25/23  1259      History   Chief Complaint Chief Complaint  Patient presents with   Cough    Pt states he was seen in here last Tuesday for cold like symptoms and sent home with medication. Pt states he is taking his medications as prescribed with no relief on symptoms. Pt c/o persistent cough bothering him to sleep at night.    HPI Gregg Smith is a 77 y.o. male.   Patient presents for evaluation of persisting chest congestion, productive cough and intermittent wheezing present for 5 to 6 days.  Evaluated in urgent care initially 4 days ago, started on prednisone and Tessalon, endorses minimal improvement seen.  Initial fatigue and sore throat have resolved.  Having difficulty sleeping throughout the night due to persistent coughing, endorses that he has had to prop himself up on pillows to make it easier to breathe.  Denies respiratory history.  Non-smoker.   Past Medical History:  Diagnosis Date   Allergy    seasonal    Anxiety    Arthritis    Barrett's esophagus    Cataract    Chicken pox    Colon polyps    hyperplastic   Diverticulosis    GERD (gastroesophageal reflux disease)    Hemorrhoids    Hyperlipidemia    Hypertension    Insomnia    Leg cramps    Measles    Mumps    Obstructive sleep apnea    Pneumonia    Sleep apnea    dental device    Patient Active Problem List   Diagnosis Date Noted   Overactive bladder 09/17/2022   Lower urinary tract symptoms 03/17/2022   Aortic atherosclerosis (HCC) 03/17/2022   Loud snoring 10/23/2021   Acute insomnia 10/23/2021   Chronic left-sided low back pain with left-sided sciatica 03/05/2021   Osteoarthritis of knee 01/31/2021   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   Scoliosis deformity of spine 04/22/2019   Generalized anxiety disorder    Panic disorder    Insomnia    Spinal stenosis of lumbar region 11/30/2018   Dental  plaque 11/02/2018   GERD (gastroesophageal reflux disease) 08/14/2017   S/P left TKA 08/11/2016   S/P knee replacement 08/11/2016   OSA (obstructive sleep apnea) 03/22/2014   Snoring disorder 05/09/2013   Essential hypertension 02/11/2013   Dyslipidemia 02/11/2013   Arthritis 02/11/2013    Past Surgical History:  Procedure Laterality Date   APPENDECTOMY  1957   CATARACT EXTRACTION     COLONOSCOPY     JOINT REPLACEMENT     TONSILLECTOMY     TONSILLECTOMY AND ADENOIDECTOMY  1954   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   Procedure: LEFT TOTAL KNEE ARTHROPLASTY;  Surgeon: Durene Romans, MD;  Location: [facility];  Service: Orthopedics;  Laterality: Left;   UPPER GASTROINTESTINAL ENDOSCOPY         Home Medications    Prior to Admission medications   Medication Sig Start Date End Date Taking? Authorizing Provider  azithromycin (ZITHROMAX) 250 MG tablet Take 1 tablet (250 mg total) by mouth daily. Take first 2 tablets together, then 1 every day until finished. 11/27/22   Mardella Layman, MD  benzonatate (TESSALON) 200 MG capsule Take 1 capsule (200 mg total) by mouth 3 (three) times daily as needed for cough. 01/21/23   Garrison, [place] N, FNP  CLINPRO 5000 1.1 % PSTE Place onto teeth daily. 03/23/20  [provider]  GEMTESA 75 MG TABS TAKE 75 MG BY MOUTH DAILY. 12/17/22   Georgina Quint, MD  lansoprazole (PREVACID) 30 MG capsule TAKE 1 CAPSULE (30 MG TOTAL) BY MOUTH 2 (TWO) TIMES DAILY BEFORE A MEAL. 12/17/22   Hilarie Fredrickson, MD  Multiple Vitamin (MULTIVITAMIN) tablet Take 1 tablet by mouth daily.    [provider]  predniSONE (DELTASONE) 20 MG tablet Take 2 tablets (40 mg total) by mouth daily for 5 days. 01/21/23 01/26/23  Garrison, [place] N, FNP  rosuvastatin (CRESTOR) 10 MG tablet TAKE 1 TABLET BY MOUTH EVERY DAY 12/17/22   Georgina Quint, MD  SYMBICORT 80-4.5 MCG/ACT inhaler TAKE 2 PUFFS BY MOUTH TWICE A DAY 09/18/22   Georgina Quint, MD   tamsulosin (FLOMAX) 0.4 MG CAPS capsule TAKE 1 CAPSULE BY MOUTH DAILY AT NIGHT. 03/05/21   Georgina Quint, MD  traZODone (DESYREL) 50 MG tablet Take 1 tablet (50 mg total) by mouth at bedtime and may repeat dose one time if needed. 04/16/19   Oneta Rack, NP  UNKNOWN TO PATIENT "A med to help me sleep"    [provider]  valsartan-hydrochlorothiazide (DIOVAN-HCT) 80-12.5 MG tablet TAKE 1 TABLET BY MOUTH EVERY DAY 08/20/22   Georgina Quint, MD    Family History Family History  Problem Relation Age of Onset   Alzheimer's disease Mother    Cancer Father    Stomach cancer Father    Aortic aneurysm Son    Heart disease Son    Cancer Maternal Grandmother    Heart disease Maternal Grandfather    Stomach cancer Paternal Grandfather    Colon cancer Neg Hx    Colon polyps Neg Hx    Esophageal cancer Neg Hx    Rectal cancer Neg Hx    Pancreatic cancer Neg Hx    Liver cancer Neg Hx     Social History Social History   Tobacco Use   Smoking status: Former    Packs/day: 1.00    Years: 40.00    Additional pack years: 0.00    Total pack years: 40.00    Types: Cigarettes    Quit date: 01/19/2007    Years since quitting: 16.0   Smokeless tobacco: Never  Vaping Use   Vaping Use: Never used  Substance Use Topics   Alcohol use: Yes    Alcohol/week: 1.0 standard drink of alcohol    Types: 1 Cans of beer per week   Drug use: No     Allergies   Penicillins   Review of Systems Review of Systems  Respiratory:  Positive for cough.      Physical Exam Triage Vital Signs ED Triage Vitals [01/25/23 1322]  Enc Vitals Group     BP (!) 145/92     Pulse Rate 98     Resp (!) 21     Temp 98.2 F (36.8 C)     Temp Source Oral     SpO2 92 %     Weight 251 lb 5.2 oz (114 kg)     Height 5\' 11"  (1.803 m)     Head Circumference      Peak Flow      Pain Score 0     Pain Loc      Pain Edu?      Excl. in GC?    No data found.  Updated Vital Signs BP (!)  145/92 (BP Location: Right Arm)   Pulse 98  Temp 98.2 F (36.8 C) (Oral)   Resp (!) 21   Ht 5\' 11"  (1.803 m)   Wt 251 lb 5.2 oz (114 kg)   SpO2 92%   BMI 35.05 kg/m   Visual Acuity Right Eye Distance:   Left Eye Distance:   Bilateral Distance:    Right Eye Near:   Left Eye Near:    Bilateral Near:     Physical Exam Constitutional:      Appearance: Normal appearance.  HENT:     Right Ear: Tympanic membrane, ear canal and external ear normal.     Left Ear: Tympanic membrane, ear canal and external ear normal.     Nose: Congestion and rhinorrhea present.     Mouth/Throat:     Mouth: Mucous membranes are moist.     Pharynx: Posterior oropharyngeal erythema present.  Eyes:     Extraocular Movements: Extraocular movements intact.  Cardiovascular:     Rate and Rhythm: Normal rate and regular rhythm.     Pulses: Normal pulses.     Heart sounds: Normal heart sounds.  Pulmonary:     Effort: Pulmonary effort is normal.     Comments: Congested lungs present to the upper lobes, lower lobes diminished Skin:    General: Skin is warm and dry.  Neurological:     Mental Status: He is alert and oriented to person, place, and time. Mental status is at baseline.      UC Treatments / Results  Labs (all labs ordered are listed, but only abnormal results are displayed) Labs Reviewed - No data to display  EKG   Radiology No results found.  Procedures Procedures (including critical care time)  Medications Ordered in UC Medications - No data to display  Initial Impression / Assessment and Plan / UC Course  I have reviewed the triage vital signs and the nursing notes.  Pertinent labs & imaging results that were available during my care of the patient were reviewed by me and considered in my medical decision making (see chart for details).  Acute upper respiratory infection  Vital signs stable patient is in no signs of distress nor toxic appearing, congestion noted to the  lungs, chest x-ray completed 4 days ago negative, as symptoms have persisted we will provide bacterial coverage based on examination, azithromycin prescribed as well as Promethazine DM for management of cough at nighttime, given strict precautions that if no improvement seen with use of medication he is to return for reevaluation and repeat chest x-ray completed at that time Final Clinical Impressions(s) / UC Diagnoses   Final diagnoses:  None   Discharge Instructions   None    ED Prescriptions   None    PDMP not reviewed this encounter.   Valinda Hoar, NP 01/25/23 1420

## 2023-01-25 NOTE — ED Triage Notes (Signed)
Pt states he was seen in here last Tuesday for cold like symptoms and sent home with medication. Pt states he is taking his medications as prescribed with no relief on symptoms. Pt c/o persistent cough bothering him to sleep at night.

## 2023-01-25 NOTE — Discharge Instructions (Signed)
On exam lungs do sound congested as compared to your initial exam a few days ago therefore you will be started on antibiotic  Begin azithromycin as directed  You may use Promethazine DM cough syrup at bedtime for additional comfort and to allow you to rest  If you continue to have symptoms past use of the medicine please follow-up for reevaluation  You can take Tylenol and/or Ibuprofen as needed for fever reduction and pain relief.   For cough: honey 1/2 to 1 teaspoon (you can dilute the honey in water or another fluid).  You can also use guaifenesin and dextromethorphan for cough. You can use a humidifier for chest congestion and cough.  If you don't have a humidifier, you can sit in the bathroom with the hot shower running.      For sore throat: try warm salt water gargles, cepacol lozenges, throat spray, warm tea or water with lemon/honey, popsicles or ice, or OTC cold relief medicine for throat discomfort.   For congestion: take a daily anti-histamine like Zyrtec, Claritin, and a oral decongestant, such as pseudoephedrine.  You can also use Flonase 1-2 sprays in each nostril daily.   It is important to stay hydrated: drink plenty of fluids (water, gatorade/powerade/pedialyte, juices, or teas) to keep your throat moisturized and help further relieve irritation/discomfort.

## 2023-02-02 DIAGNOSIS — G4700 Insomnia, unspecified: Secondary | ICD-10-CM | POA: Diagnosis not present

## 2023-02-02 DIAGNOSIS — F411 Generalized anxiety disorder: Secondary | ICD-10-CM | POA: Diagnosis not present

## 2023-02-03 ENCOUNTER — Ambulatory Visit: Payer: Medicare PPO | Admitting: Emergency Medicine

## 2023-02-03 ENCOUNTER — Encounter: Payer: Self-pay | Admitting: Emergency Medicine

## 2023-02-03 VITALS — BP 120/64 | HR 80 | Temp 98.5°F | Ht 71.0 in | Wt 244.2 lb

## 2023-02-03 DIAGNOSIS — S61319A Laceration without foreign body of unspecified finger with damage to nail, initial encounter: Secondary | ICD-10-CM | POA: Insufficient documentation

## 2023-02-03 DIAGNOSIS — J988 Other specified respiratory disorders: Secondary | ICD-10-CM | POA: Diagnosis not present

## 2023-02-03 DIAGNOSIS — B9789 Other viral agents as the cause of diseases classified elsewhere: Secondary | ICD-10-CM | POA: Diagnosis not present

## 2023-02-03 DIAGNOSIS — R053 Chronic cough: Secondary | ICD-10-CM | POA: Diagnosis not present

## 2023-02-03 DIAGNOSIS — R0989 Other specified symptoms and signs involving the circulatory and respiratory systems: Secondary | ICD-10-CM | POA: Diagnosis not present

## 2023-02-03 MED ORDER — PSEUDOEPHEDRINE-GUAIFENESIN ER 60-600 MG PO TB12
1.0000 | ORAL_TABLET | Freq: Two times a day (BID) | ORAL | 1 refills | Status: DC
Start: 2023-02-03 — End: 2023-04-07

## 2023-02-03 NOTE — Assessment & Plan Note (Signed)
Stable without complications.  Healing well without infection.

## 2023-02-03 NOTE — Assessment & Plan Note (Signed)
Recommend to start Mucinex-D every 12 hours as needed. Advised to rest and stay well-hydrated

## 2023-02-03 NOTE — Assessment & Plan Note (Signed)
Cough management discussed. Recommend over-the-counter Delsym and cough drops Advised to rest and stay well-hydrated

## 2023-02-03 NOTE — Assessment & Plan Note (Signed)
Clinically stable and running its course.  No complications No red flag signs or symptoms.  No signs of pneumonia.

## 2023-02-03 NOTE — Patient Instructions (Signed)

## 2023-02-03 NOTE — Progress Notes (Signed)
Gregg Smith 77 y.o.   Chief Complaint  Patient presents with   Medical Management of Chronic Issues    Patient states he has been to urgent care twice and still have a bad cold, not feeling 100% better   Finger Injury    Patient cut his pinky finger nail on a chainsaw left hand     HISTORY OF PRESENT ILLNESS: Acute problem visit today. This is a 77 y.o. male complaining of flu like symptoms that started 2 weeks ago.  Still having some cough and congestion Went to urgent care center twice.  Was given 1 round of azithromycin which she finished last Thursday.  Overall feels better Also sustained small laceration to left fifth finger nailbed with chainsaw 4 days ago.  Healing well.  HPI   Prior to Admission medications   Medication Sig Start Date End Date Taking? Authorizing Provider  benzonatate (TESSALON) 200 MG capsule Take 1 capsule (200 mg total) by mouth 3 (three) times daily as needed for cough. 01/21/23  Yes Rinaldo Ratel, [place] N, FNP  CLINPRO 5000 1.1 % PSTE Place onto teeth daily. 03/23/20  Yes [provider]  GEMTESA 75 MG TABS TAKE 75 MG BY MOUTH DAILY. 12/17/22  Yes Zyier Dykema, Eilleen Kempf, MD  lansoprazole (PREVACID) 30 MG capsule TAKE 1 CAPSULE (30 MG TOTAL) BY MOUTH 2 (TWO) TIMES DAILY BEFORE A MEAL. 12/17/22  Yes Hilarie Fredrickson, MD  Multiple Vitamin (MULTIVITAMIN) tablet Take 1 tablet by mouth daily.   Yes [provider]  promethazine-dextromethorphan (PROMETHAZINE-DM) 6.25-15 MG/5ML syrup Take 5 mLs by mouth at bedtime as needed for cough. 01/25/23  Yes White, Adrienne R, NP  rosuvastatin (CRESTOR) 10 MG tablet TAKE 1 TABLET BY MOUTH EVERY DAY 12/17/22  Yes Danielle Lento, Eilleen Kempf, MD  SYMBICORT 80-4.5 MCG/ACT inhaler TAKE 2 PUFFS BY MOUTH TWICE A DAY 09/18/22  Yes Bruce Mayers, Eilleen Kempf, MD  tamsulosin (FLOMAX) 0.4 MG CAPS capsule TAKE 1 CAPSULE BY MOUTH DAILY AT NIGHT. 03/05/21  Yes Trevaun Rendleman, Eilleen Kempf, MD  UNKNOWN TO PATIENT "A med to help me sleep"   Yes  [provider]  valsartan-hydrochlorothiazide (DIOVAN-HCT) 80-12.5 MG tablet TAKE 1 TABLET BY MOUTH EVERY DAY 08/20/22  Yes Lariza Cothron, Eilleen Kempf, MD  traZODone (DESYREL) 50 MG tablet Take 1 tablet (50 mg total) by mouth at bedtime and may repeat dose one time if needed. Patient not taking: Reported on 02/03/2023 04/16/19   Oneta Rack, NP    Allergies  Allergen Reactions   Penicillins Hives and Rash    Has patient had a PCN reaction causing immediate rash, facial/tongue/throat swelling, SOB or lightheadedness with hypotension:unsure Has patient had a PCN reaction causing severe rash involving mucus membranes or skin necrosis:unsure Has patient had a PCN reaction that required hospitalization:No Has patient had a PCN reaction occurring within the last 10 years:No If all of the above answers are "NO", then may proceed with Cephalosporin use. 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   Scoliosis deformity of spine 04/22/2019   Generalized anxiety disorder    Panic disorder    Insomnia    Spinal stenosis of lumbar region  11/30/2018   Dental plaque 11/02/2018   GERD (gastroesophageal reflux disease) 08/14/2017   S/P left TKA 08/11/2016   S/P knee replacement 08/11/2016   OSA (obstructive sleep apnea) 03/22/2014   Snoring disorder 05/09/2013   Essential hypertension 02/11/2013   Dyslipidemia 02/11/2013   Arthritis 02/11/2013    Past Medical History:  Diagnosis Date   Allergy    seasonal    Anxiety    Arthritis    Barrett's esophagus    Cataract    Chicken pox    Colon polyps    hyperplastic    Diverticulosis    GERD (gastroesophageal reflux disease)    Hemorrhoids    Hyperlipidemia    Hypertension    Insomnia    Leg cramps    Measles    Mumps    Obstructive sleep apnea    Pneumonia    Sleep apnea    dental device    Past Surgical History:  Procedure Laterality Date   APPENDECTOMY  1957   CATARACT EXTRACTION     COLONOSCOPY     JOINT REPLACEMENT     TONSILLECTOMY     TONSILLECTOMY AND ADENOIDECTOMY  1954   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   Procedure: LEFT TOTAL KNEE ARTHROPLASTY;  Surgeon: Durene Romans, MD;  Location: [facility];  Service: Orthopedics;  Laterality: Left;   UPPER GASTROINTESTINAL ENDOSCOPY      Social History   Socioeconomic History   Marital status: Widowed    Spouse name: [name]   Number of children: 1   Years of education: PHD   Highest education level: Doctorate  Occupational History    Comment: Loss adjuster, chartered: [employer]  Tobacco Use   Smoking status: Former    Packs/day: 1.00    Years: 40.00    Additional pack years: 0.00    Total pack years: 40.00    Types: Cigarettes    Quit date: 01/19/2007    Years since quitting: 16.0   Smokeless tobacco: Never  Vaping Use   Vaping Use: Never used  Substance and Sexual Activity   Alcohol use: Yes    Alcohol/week: 1.0 standard drink of alcohol    Types: 1 Cans of beer per week   Drug use: No   Sexual activity: Not on file  Other Topics Concern   Not on file  Social History Narrative   Patient lives alone in his home   Patient is retired   Patient has a Ph.D   Patient has one adult child.   Patient is right-handed.   Patient drinks 1 cup of tea and 1 cup of coffee daily    Social Determinants of Health   Financial Resource Strain: Low Risk  (02/02/2023)   Overall Financial Resource Strain (CARDIA)    Difficulty of Paying Living Expenses: Not hard at all  Food Insecurity: No Food Insecurity (02/02/2023)   Hunger Vital Sign    Worried About  Running Out of Food in the Last Year: Never true    Ran Out of Food in the Last Year: Never true  Transportation Needs: No Transportation Needs (02/02/2023)   PRAPARE - Administrator, Civil Service (Medical): No    Lack of Transportation (Non-Medical): No  Physical Activity: Sufficiently Active (02/02/2023)   Exercise Vital Sign    Days of Exercise per Week: 5 days    Minutes of Exercise per Session: 90 min  Stress: Stress Concern Present (02/02/2023)  [employer] of Occupational Health - Occupational Stress Questionnaire    Feeling of Stress : To some extent  Social Connections: Moderately Isolated (02/02/2023)   Social Connection and Isolation Panel [NHANES]    Frequency of Communication with Friends and Family: More than three times a week    Frequency of Social Gatherings with Friends and Family: More than three times a week    Attends Religious Services: More than 4 times per year    Active Member of [employer] or Organizations: No    Attends Banker Meetings: Not on file    Marital Status: Widowed  Catering manager Violence: Not on file    Family History  Problem Relation Age of Onset   Alzheimer's disease Mother    Cancer Father    Stomach cancer Father    Aortic aneurysm Son    Heart disease Son    Cancer Maternal Grandmother    Heart disease Maternal Grandfather    Stomach cancer Paternal Grandfather    Colon cancer Neg Hx    Colon polyps Neg Hx    Esophageal cancer Neg Hx    Rectal cancer Neg Hx    Pancreatic cancer Neg Hx    Liver cancer Neg Hx      Review of Systems  Constitutional: Negative.  Negative for chills and fever.  HENT:  Positive for congestion.   Respiratory:  Positive for cough.   Cardiovascular:  Negative for chest pain and palpitations.  Gastrointestinal:  Negative for abdominal pain, nausea and vomiting.  Genitourinary: Negative.  Negative for dysuria and hematuria.  Skin: Negative.  Negative for rash.  Neurological:  Negative.  Negative for dizziness and headaches.  All other systems reviewed and are negative.   Vitals:   02/03/23 1520  BP: 120/64  Pulse: 80  Temp: 98.5 F (36.9 C)  SpO2: 97%    Physical Exam Vitals reviewed.  Constitutional:      Appearance: Normal appearance.  HENT:     Head: Normocephalic.     Mouth/Throat:     Mouth: Mucous membranes are moist.     Pharynx: Oropharynx is clear.  Eyes:     Extraocular Movements: Extraocular movements intact.     Conjunctiva/sclera: Conjunctivae normal.     Pupils: Pupils are equal, round, and reactive to light.  Cardiovascular:     Rate and Rhythm: Normal rate and regular rhythm.     Pulses: Normal pulses.     Heart sounds: Normal heart sounds.  Pulmonary:     Effort: Pulmonary effort is normal.     Breath sounds: Normal breath sounds.  Abdominal:     Palpations: Abdomen is soft.     Tenderness: There is no abdominal tenderness.  Musculoskeletal:     Cervical back: No tenderness.  Lymphadenopathy:     Cervical: No cervical adenopathy.  Skin:    General: Skin is warm and dry.     Capillary Refill: Capillary refill takes less than 2 seconds.     Comments: Left fifth finger: Lacerated nail and nailbed about 15 days old.  Healing well.  No signs of infection.  Neurovascularly intact.  Full range of motion.  Neurological:     Mental Status: He is alert and oriented to person, place, and time.  Psychiatric:        Mood and Affect: Mood normal.        Behavior: Behavior normal.      ASSESSMENT & PLAN: A total of 32 minutes was spent  with the patient and counseling/coordination of care regarding preparing for this visit, review of most recent office visit notes, review of chronic medical conditions under management, review of all medications, diagnosis of viral upper respiratory infection and chest congestion, cough and congestion management, prognosis, documentation and need for follow-up.  Problem List Items Addressed This  Visit       Respiratory   Chest congestion - Primary    Recommend to start Mucinex-D every 12 hours as needed. Advised to rest and stay well-hydrated      Relevant Medications   pseudoephedrine-guaifenesin (MUCINEX D) 60-600 MG 12 hr tablet   Viral respiratory infection    Clinically stable and running its course.  No complications No red flag signs or symptoms.  No signs of pneumonia.        Musculoskeletal and Integument   Laceration of nail bed of finger    Stable without complications.  Healing well without infection.        Other   Persistent cough    Cough management discussed. Recommend over-the-counter Delsym and cough drops Advised to rest and stay well-hydrated      Patient Instructions  Viral Respiratory Infection A viral respiratory infection is an illness that affects parts of the body that are used for breathing. These include the lungs, nose, and throat. It is caused by a germ called a virus. Some examples of this kind of infection are: A cold. The flu (influenza). A respiratory syncytial virus (RSV) infection. What are the causes? This condition is caused by a virus. It spreads from person to person. You can get the virus if: You breathe in droplets from someone who is sick. You come in contact with people who are sick. You touch mucus or other fluid from a person who is sick. What are the signs or symptoms? Symptoms of this condition include: A stuffy or runny nose. A sore throat. A cough. Shortness of breath. Trouble breathing. Yellow or green fluid in the nose. Other symptoms may include: A fever. Sweating or chills. Tiredness (fatigue). Achy muscles. A headache. How is this treated? This condition may be treated with: Medicines that treat viruses. Medicines that make it easy to breathe. Medicines that are sprayed into the nose. Acetaminophen or NSAIDs, such as ibuprofen, to treat fever. Follow these instructions at home: Managing  pain and congestion Take over-the-counter and prescription medicines only as told by your doctor. If you have a sore throat, gargle with salt water. Do this 3-4 times a day or as needed. To make salt water, dissolve -1 tsp (3-6 g) of salt in 1 cup (237 mL) of warm water. Make sure that all the salt dissolves. Use nose drops made from salt water. This helps with stuffiness (congestion). It also helps soften the skin around your nose. Take 2 tsp (10 mL) of honey at bedtime to lessen coughing at night. Do not give honey to children who are younger than 55 year old. Drink enough fluid to keep your pee (urine) pale yellow. General instructions  Rest as much as possible. Do not drink alcohol. Do not smoke or use any products that contain nicotine or tobacco. If you need help quitting, ask your doctor. Keep all follow-up visits. How is this prevented?     Get a flu shot every year. Ask your doctor when you should get your flu shot. Do not let other people get your germs. If you are sick: Wash your hands with soap and water often. Wash your  hands after you cough or sneeze. Wash hands for at least 20 seconds. If you cannot use soap and water, use hand sanitizer. Cover your mouth when you cough. Cover your nose and mouth when you sneeze. Do not share cups or eating utensils. Clean commonly used objects often. Clean commonly touched surfaces. Stay home from work or school. Avoid contact with people who are sick during cold and flu season. This is in fall and winter. Get help if: Your symptoms last for 10 days or longer. Your symptoms get worse over time. You have very bad pain in your face or forehead. Parts of your jaw or neck get very swollen. You have shortness of breath. Get help right away if: You feel pain or pressure in your chest. You have trouble breathing. You faint or feel like you will faint. You keep vomiting and it gets worse. You feel confused. These symptoms may be an  emergency. Get help right away. Call your local emergency services (911 in the [place] Do not wait to see if the symptoms will go away. Do not drive yourself to the hospital. Summary A viral respiratory infection is an illness that affects parts of the body that are used for breathing. Examples of this illness include a cold, the flu, and a respiratory syncytial virus (RSV) infection. The infection can cause a runny nose, cough, sore throat, and fever. Follow what your doctor tells you about taking medicines, drinking lots of fluid, washing your hands, resting at home, and avoiding people who are sick. This information is not intended to replace advice given to you by your health care provider. Make sure you discuss any questions you have with your health care provider. Document Revised: 11/22/2020 Document Reviewed: 11/22/2020 Elsevier Patient Education  2024 Elsevier Inc.    Edwina Barth, MD [facility]

## 2023-02-10 ENCOUNTER — Ambulatory Visit: Payer: Medicare PPO | Admitting: Podiatry

## 2023-02-10 ENCOUNTER — Encounter: Payer: Self-pay | Admitting: Podiatry

## 2023-02-10 VITALS — BP 138/75 | HR 82

## 2023-02-10 DIAGNOSIS — M79674 Pain in right toe(s): Secondary | ICD-10-CM | POA: Diagnosis not present

## 2023-02-10 DIAGNOSIS — B351 Tinea unguium: Secondary | ICD-10-CM | POA: Diagnosis not present

## 2023-02-10 DIAGNOSIS — M79675 Pain in left toe(s): Secondary | ICD-10-CM

## 2023-02-10 NOTE — Progress Notes (Signed)
  Subjective:  Patient ID: Gregg Smith, male    DOB: Jul 16, 1946,  MRN: [id number]  Chief Complaint  Patient presents with   Nail Problem    "It's doing pretty well.  The nails did not fall off."    77 y.o. male presents with the above complaint. History confirmed with patient.  Nails are thickened elongated causing discomfort again.  The left hallux nail is still well attached  Objective:  Physical Exam: warm, good capillary refill, no trophic changes or ulcerative lesions, normal DP and PT pulses and normal sensory exam.  Nails with thickened elongated dystrophy subungual debris and yellow discoloration x 10.     [facility] pathology results with onychomycosis       Assessment:   1. Pain due to onychomycosis of toenails of both feet      Plan:  Patient was evaluated and treated and all questions answered.  Discussed the etiology and treatment options for the condition in detail with the patient.Recommended debridement of the nails today. Sharp and mechanical debridement performed of all painful and mycotic nails today. Nails debrided in length and thickness using a nail nipper to level of comfort.  Left hallux nail is well attached does not appear to be loosening or require removal at this point.      Return in about 3 months (around 05/13/2023) for painful thick fungal nails.

## 2023-02-26 ENCOUNTER — Encounter: Payer: Self-pay | Admitting: Internal Medicine

## 2023-03-10 ENCOUNTER — Encounter: Payer: Self-pay | Admitting: Internal Medicine

## 2023-03-18 ENCOUNTER — Ambulatory Visit: Payer: Medicare PPO | Admitting: Emergency Medicine

## 2023-03-18 ENCOUNTER — Encounter: Payer: Self-pay | Admitting: Emergency Medicine

## 2023-03-18 VITALS — BP 132/76 | HR 65 | Temp 97.9°F | Ht 71.0 in | Wt 245.1 lb

## 2023-03-18 DIAGNOSIS — I700 Atherosclerosis of aorta: Secondary | ICD-10-CM

## 2023-03-18 DIAGNOSIS — K219 Gastro-esophageal reflux disease without esophagitis: Secondary | ICD-10-CM | POA: Diagnosis not present

## 2023-03-18 DIAGNOSIS — F411 Generalized anxiety disorder: Secondary | ICD-10-CM | POA: Diagnosis not present

## 2023-03-18 DIAGNOSIS — E785 Hyperlipidemia, unspecified: Secondary | ICD-10-CM

## 2023-03-18 DIAGNOSIS — I10 Essential (primary) hypertension: Secondary | ICD-10-CM

## 2023-03-18 DIAGNOSIS — N3281 Overactive bladder: Secondary | ICD-10-CM

## 2023-03-18 DIAGNOSIS — G4700 Insomnia, unspecified: Secondary | ICD-10-CM | POA: Diagnosis not present

## 2023-03-18 LAB — CBC WITH DIFFERENTIAL/PLATELET
Basophils Absolute: 0.1 10*3/uL (ref 0.0–0.1)
Basophils Relative: 1 % (ref 0.0–3.0)
Eosinophils Absolute: 0.2 10*3/uL (ref 0.0–0.7)
Eosinophils Relative: 3.3 % (ref 0.0–5.0)
HCT: 41.7 % (ref 39.0–52.0)
Hemoglobin: 13.9 g/dL (ref 13.0–17.0)
Lymphocytes Relative: 16 % (ref 12.0–46.0)
Lymphs Abs: 0.9 10*3/uL (ref 0.7–4.0)
MCHC: 33.3 g/dL (ref 30.0–36.0)
MCV: 85.5 fl (ref 78.0–100.0)
Monocytes Absolute: 0.8 10*3/uL (ref 0.1–1.0)
Monocytes Relative: 14 % — ABNORMAL HIGH (ref 3.0–12.0)
Neutro Abs: 3.6 10*3/uL (ref 1.4–7.7)
Neutrophils Relative %: 65.7 % (ref 43.0–77.0)
Platelets: 191 10*3/uL (ref 150.0–400.0)
RBC: 4.88 Mil/uL (ref 4.22–5.81)
RDW: 15.5 % (ref 11.5–15.5)
WBC: 5.4 10*3/uL (ref 4.0–10.5)

## 2023-03-18 LAB — HEMOGLOBIN A1C: Hgb A1c MFr Bld: 5.5 % (ref 4.6–6.5)

## 2023-03-18 LAB — COMPREHENSIVE METABOLIC PANEL
ALT: 27 U/L (ref 0–53)
AST: 40 U/L — ABNORMAL HIGH (ref 0–37)
Albumin: 3.8 g/dL (ref 3.5–5.2)
Alkaline Phosphatase: 85 U/L (ref 39–117)
BUN: 23 mg/dL (ref 6–23)
CO2: 29 mEq/L (ref 19–32)
Calcium: 9.5 mg/dL (ref 8.4–10.5)
Chloride: 106 mEq/L (ref 96–112)
Creatinine, Ser: 0.99 mg/dL (ref 0.40–1.50)
GFR: 73.93 mL/min (ref >60.00)
Glucose, Bld: 102 mg/dL — ABNORMAL HIGH (ref 70–99)
Potassium: 4.1 mEq/L (ref 3.5–5.1)
Sodium: 142 mEq/L (ref 135–145)
Total Bilirubin: 0.6 mg/dL (ref 0.2–1.2)
Total Protein: 7.4 g/dL (ref 6.0–8.3)

## 2023-03-18 LAB — LIPID PANEL
Cholesterol: 144 mg/dL (ref 0–200)
HDL: 39 mg/dL — ABNORMAL LOW (ref >39.00)
LDL Cholesterol: 87 mg/dL (ref 0–99)
NonHDL: 105.16
Total CHOL/HDL Ratio: 4
Triglycerides: 89 mg/dL (ref 0.0–149.0)
VLDL: 17.8 mg/dL (ref 0.0–40.0)

## 2023-03-18 NOTE — Progress Notes (Signed)
Gregg Smith 77 y.o.   Chief Complaint  Patient presents with   Medical Management of Chronic Issues    f/u appt patient states he is concern a little about his weight. He states he lost about 30lbs     HISTORY OF PRESENT ILLNESS: This is a 77 y.o. male here for 36-month follow-up of chronic medical conditions Doing much better.  Eating well.  Ingesting less amounts of sugar Exercising more.  Weight loss as a result. No other complaints or medical concerns today. Wt Readings from Last 3 Encounters:  03/18/23 245 lb 2 oz (111.2 kg)  02/03/23 244 lb 4 oz (110.8 kg)  01/25/23 251 lb 5.2 oz (114 kg)     HPI   Prior to Admission medications   Medication Sig Start Date End Date Taking? Authorizing Provider  CLINPRO 5000 1.1 % PSTE Place onto teeth daily. 03/23/20  Yes [provider]  GEMTESA 75 MG TABS TAKE 75 MG BY MOUTH DAILY. 12/17/22  Yes Balraj Brayfield, Eilleen Kempf, MD  lansoprazole (PREVACID) 30 MG capsule TAKE 1 CAPSULE (30 MG TOTAL) BY MOUTH 2 (TWO) TIMES DAILY BEFORE A MEAL. 12/17/22  Yes Hilarie Fredrickson, MD  Multiple Vitamin (MULTIVITAMIN) tablet Take 1 tablet by mouth daily.   Yes [provider]  rosuvastatin (CRESTOR) 10 MG tablet TAKE 1 TABLET BY MOUTH EVERY DAY 12/17/22  Yes Aja Bolander, Eilleen Kempf, MD  SYMBICORT 80-4.5 MCG/ACT inhaler TAKE 2 PUFFS BY MOUTH TWICE A DAY 09/18/22  Yes Ceylon Arenson, Eilleen Kempf, MD  tamsulosin (FLOMAX) 0.4 MG CAPS capsule TAKE 1 CAPSULE BY MOUTH DAILY AT NIGHT. 03/05/21  Yes Kealohilani Maiorino, Eilleen Kempf, MD  valsartan-hydrochlorothiazide (DIOVAN-HCT) 80-12.5 MG tablet TAKE 1 TABLET BY MOUTH EVERY DAY 08/20/22  Yes Aero Drummonds, Eilleen Kempf, MD  pseudoephedrine-guaifenesin (MUCINEX D) 60-600 MG 12 hr tablet Take 1 tablet by mouth every 12 (twelve) hours. Patient not taking: Reported on 03/18/2023 02/03/23   Georgina Quint, MD  traZODone (DESYREL) 50 MG tablet Take 1 tablet (50 mg total) by mouth at bedtime and may repeat dose one time if  needed. Patient not taking: Reported on 02/10/2023 04/16/19   Oneta Rack, NP  UNKNOWN TO PATIENT "A med to help me sleep"    [provider]    Allergies  Allergen Reactions   Penicillins Hives and Rash    Has patient had a PCN reaction causing immediate rash, facial/tongue/throat swelling, SOB or lightheadedness with hypotension:unsure Has patient had a PCN reaction causing severe rash involving mucus membranes or skin necrosis:unsure Has patient had a PCN reaction that required hospitalization:No Has patient had a PCN reaction occurring within the last 10 years:No If all of the above answers are "NO", then may proceed with Cephalosporin use. 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   Scoliosis deformity of spine 04/22/2019   Generalized anxiety disorder    Panic disorder    Insomnia    Spinal stenosis of lumbar region 11/30/2018   Dental plaque 11/02/2018  GERD (gastroesophageal reflux disease) 08/14/2017   S/P left TKA 08/11/2016   S/P knee replacement 08/11/2016   OSA (obstructive sleep apnea) 03/22/2014   Snoring disorder 05/09/2013   Essential hypertension 02/11/2013   Dyslipidemia 02/11/2013   Arthritis 02/11/2013    Past Medical History:  Diagnosis Date   Allergy    seasonal    Anxiety    Arthritis    Barrett's esophagus    Cataract    Chicken pox    Colon polyps    hyperplastic   Diverticulosis    GERD (gastroesophageal reflux  disease)    Hemorrhoids    Hyperlipidemia    Hypertension    Insomnia    Leg cramps    Measles    Mumps    Obstructive sleep apnea    Pneumonia    Sleep apnea    dental device    Past Surgical History:  Procedure Laterality Date   APPENDECTOMY  1957   CATARACT EXTRACTION     COLONOSCOPY     JOINT REPLACEMENT     TONSILLECTOMY     TONSILLECTOMY AND ADENOIDECTOMY  1954   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   Procedure: LEFT TOTAL KNEE ARTHROPLASTY;  Surgeon: Durene Romans, MD;  Location: [facility];  Service: Orthopedics;  Laterality: Left;   UPPER GASTROINTESTINAL ENDOSCOPY      Social History   Socioeconomic History   Marital status: Widowed    Spouse name: Gregg Smith   Number of children: 1   Years of education: PHD   Highest education level: Doctorate  Occupational History    Comment: Loss adjuster, chartered: [employer]  Tobacco Use   Smoking status: Former    Current packs/day: 0.00    Average packs/day: 1 pack/day for 40.0 years (40.0 ttl pk-yrs)    Types: Cigarettes    Start date: 01/19/1967    Quit date: 01/19/2007    Years since quitting: 16.1   Smokeless tobacco: Never  Vaping Use   Vaping status: Never Used  Substance and Sexual Activity   Alcohol use: Yes    Alcohol/week: 1.0 standard drink of alcohol    Types: 1 Cans of beer per week   Drug use: No   Sexual activity: Not on file  Other Topics Concern   Not on file  Social History Narrative   Patient lives alone in his home   Patient is retired   Patient has a Ph.D   Patient has one adult child.   Patient is right-handed.   Patient drinks 1 cup of tea and 1 cup of coffee daily    Social Determinants of Health   Financial Resource Strain: Low Risk  (02/02/2023)   Overall Financial Resource Strain (CARDIA)    Difficulty of Paying Living Expenses: Not hard at all  Food Insecurity: No Food Insecurity (02/02/2023)   Hunger Vital Sign    Worried About Running Out of Food in the  Last Year: Never true    Ran Out of Food in the Last Year: Never true  Transportation Needs: No Transportation Needs (02/02/2023)   PRAPARE - Administrator, Civil Service (Medical): No    Lack of Transportation (Non-Medical): No  Physical Activity: Sufficiently Active (02/02/2023)   Exercise Vital Sign    Days of Exercise per Week: 5 days    Minutes of Exercise per Session: 90 min  Stress: Stress Concern Present (02/02/2023)   [employer] of Occupational Health -  Occupational Stress Questionnaire    Feeling of Stress : To some extent  Social Connections: Moderately Isolated (02/02/2023)   Social Connection and Isolation Panel [NHANES]    Frequency of Communication with Friends and Family: More than three times a week    Frequency of Social Gatherings with Friends and Family: More than three times a week    Attends Religious Services: More than 4 times per year    Active Member of [employer] or Organizations: No    Attends Banker Meetings: Not on file    Marital Status: Widowed  Catering manager Violence: Not on file    Family History  Problem Relation Age of Onset   Alzheimer's disease Mother    Cancer Father    Stomach cancer Father    Aortic aneurysm Son    Heart disease Son    Cancer Maternal Grandmother    Heart disease Maternal Grandfather    Stomach cancer Paternal Grandfather    Colon cancer Neg Hx    Colon polyps Neg Hx    Esophageal cancer Neg Hx    Rectal cancer Neg Hx    Pancreatic cancer Neg Hx    Liver cancer Neg Hx      Review of Systems  Constitutional: Negative.  Negative for chills and fever.  HENT: Negative.  Negative for congestion and sore throat.   Respiratory: Negative.  Negative for cough and shortness of breath.   Cardiovascular: Negative.  Negative for chest pain and palpitations.  Gastrointestinal:  Negative for abdominal pain, diarrhea, nausea and vomiting.  Genitourinary: Negative.  Negative for dysuria and hematuria.   Skin: Negative.  Negative for rash.  Neurological:  Negative for dizziness and headaches.  All other systems reviewed and are negative.   Vitals:   03/18/23 0925  BP: 132/76  Pulse: 65  Temp: 97.9 F (36.6 C)  SpO2: 98%    Physical Exam Vitals reviewed.  Constitutional:      Appearance: Normal appearance.  HENT:     Head: Normocephalic.     Mouth/Throat:     Mouth: Mucous membranes are moist.     Pharynx: Oropharynx is clear.  Eyes:     Extraocular Movements: Extraocular movements intact.     Conjunctiva/sclera: Conjunctivae normal.     Pupils: Pupils are equal, round, and reactive to light.  Cardiovascular:     Rate and Rhythm: Normal rate and regular rhythm.     Pulses: Normal pulses.     Heart sounds: Normal heart sounds.  Pulmonary:     Effort: Pulmonary effort is normal.     Breath sounds: Normal breath sounds.  Musculoskeletal:     Cervical back: No tenderness.  Lymphadenopathy:     Cervical: No cervical adenopathy.  Skin:    General: Skin is warm and dry.     Capillary Refill: Capillary refill takes less than 2 seconds.  Neurological:     General: No focal deficit present.     Mental Status: He is alert and oriented to person, place, and time.  Psychiatric:        Mood and Affect: Mood normal.        Behavior: Behavior normal.      ASSESSMENT & PLAN: A total of 46 minutes was spent with the patient and counseling/coordination of care regarding preparing for this visit, review of most recent office visit notes, review of multiple chronic medical conditions under management, review of all medications, review of most recent blood work results, prognosis,  documentation, and need for follow-up.  Problem List Items Addressed This Visit       Cardiovascular and Mediastinum   Essential hypertension - Primary    BP Readings from Last 3 Encounters:  03/18/23 132/76  02/10/23 138/75  02/03/23 120/64  Well-controlled hypertension Continue valsartan HCT  80-12.5 mg daily Eating better and losing weight Staying physically active       Relevant Orders   Comprehensive metabolic panel   CBC with Differential/Platelet   Hemoglobin A1c   Aortic atherosclerosis (HCC)    Chronic stable condition Lipid profile done today Diet and nutrition discussed Continue rosuvastatin 10 mg daily      Relevant Orders   Lipid panel     Digestive   GERD (gastroesophageal reflux disease)    Stable and asymptomatic Continues Prevacid 30 mg daily        Genitourinary   Overactive bladder    Stable Continue Gemtesa 75 mg daily and tamsulosin 0.4 mg daily        Other   Dyslipidemia    Stable chronic condition Continue rosuvastatin 10 mg daily Lipid profile done today Diet and nutrition discussed Eating better and losing weight      Relevant Orders   Hemoglobin A1c   Lipid panel   Generalized anxiety disorder    Still over worries about everything. [facility]      Insomnia    Getting better.  No longer taking trazodone      Patient Instructions  Health Maintenance After Age 78 After age 71, you are at a higher risk for certain long-term diseases and infections as well as injuries from falls. Falls are a major cause of broken bones and head injuries in people who are older than age 73. Getting regular preventive care can help to keep you healthy and well. Preventive care includes getting regular testing and making lifestyle changes as recommended by your health care provider. Talk with your health care provider about: Which screenings and tests you should have. A screening is a test that checks for a disease when you have no symptoms. A diet and exercise plan that is right for you. What should I know about screenings and tests to prevent falls? Screening and testing are the best ways to find a health problem early. Early diagnosis and treatment give you the best chance of  managing medical conditions that are common after age 51. Certain conditions and lifestyle choices may make you more likely to have a fall. Your health care provider may recommend: Regular vision checks. Poor vision and conditions such as cataracts can make you more likely to have a fall. If you wear glasses, make sure to get your prescription updated if your vision changes. Medicine review. Work with your health care provider to regularly review all of the medicines you are taking, including over-the-counter medicines. Ask your health care provider about any side effects that may make you more likely to have a fall. Tell your health care provider if any medicines that you take make you feel dizzy or sleepy. Strength and balance checks. Your health care provider may recommend certain tests to check your strength and balance while standing, walking, or changing positions. Foot health exam. Foot pain and numbness, as well as not wearing proper footwear, can make you more likely to have a fall. Screenings, including: Osteoporosis screening. Osteoporosis is a condition that causes the bones to get weaker and break more  easily. Blood pressure screening. Blood pressure changes and medicines to control blood pressure can make you feel dizzy. Depression screening. You may be more likely to have a fall if you have a fear of falling, feel depressed, or feel unable to do activities that you used to do. Alcohol use screening. Using too much alcohol can affect your balance and may make you more likely to have a fall. Follow these instructions at home: Lifestyle Do not drink alcohol if: Your health care provider tells you not to drink. If you drink alcohol: Limit how much you have to: 0-1 drink a day for women. 0-2 drinks a day for men. Know how much alcohol is in your drink. In the [place] one drink equals one 12 oz bottle of beer (355 mL), one 5 oz glass of wine (148 mL), or one 1 oz glass of hard liquor (44  mL). Do not use any products that contain nicotine or tobacco. These products include cigarettes, chewing tobacco, and vaping devices, such as e-cigarettes. If you need help quitting, ask your health care provider. Activity  Follow a regular exercise program to stay fit. This will help you maintain your balance. Ask your health care provider what types of exercise are appropriate for you. If you need a cane or walker, use it as recommended by your health care provider. Wear supportive shoes that have nonskid soles. Safety  Remove any tripping hazards, such as rugs, cords, and clutter. Install safety equipment such as grab bars in bathrooms and safety rails on stairs. Keep rooms and walkways well-lit. General instructions Talk with your health care provider about your risks for falling. Tell your health care provider if: You fall. Be sure to tell your health care provider about all falls, even ones that seem minor. You feel dizzy, tiredness (fatigue), or off-balance. Take over-the-counter and prescription medicines only as told by your health care provider. These include supplements. Eat a healthy diet and maintain a healthy weight. A healthy diet includes low-fat dairy products, low-fat (lean) meats, and fiber from whole grains, beans, and lots of fruits and vegetables. Stay current with your vaccines. Schedule regular health, dental, and eye exams. Summary Having a healthy lifestyle and getting preventive care can help to protect your health and wellness after age 58. Screening and testing are the best way to find a health problem early and help you avoid having a fall. Early diagnosis and treatment give you the best chance for managing medical conditions that are more common for people who are older than age 30. Falls are a major cause of broken bones and head injuries in people who are older than age 65. Take precautions to prevent a fall at home. Work with your health care provider to  learn what changes you can make to improve your health and wellness and to prevent falls. This information is not intended to replace advice given to you by your health care provider. Make sure you discuss any questions you have with your health care provider. Document Revised: 01/07/2021 Document Reviewed: 01/07/2021 Elsevier Patient Education  2024 Elsevier Inc.    Edwina Barth, MD  Primary Care at [facility]

## 2023-03-18 NOTE — Assessment & Plan Note (Addendum)
Stable chronic condition Continue rosuvastatin 10 mg daily Lipid profile done today Diet and nutrition discussed Eating better and losing weight

## 2023-03-18 NOTE — Assessment & Plan Note (Signed)
Still over worries about everything. [facility]

## 2023-03-18 NOTE — Assessment & Plan Note (Signed)
Getting better.  No longer taking trazodone

## 2023-03-18 NOTE — Addendum Note (Signed)
Addended by: Evie Lacks on: 03/18/2023 11:55 AM   Modules accepted: Level of Service

## 2023-03-18 NOTE — Assessment & Plan Note (Signed)
Stable and asymptomatic Continues Prevacid 30 mg daily

## 2023-03-18 NOTE — Assessment & Plan Note (Signed)
BP Readings from Last 3 Encounters:  03/18/23 132/76  02/10/23 138/75  02/03/23 120/64  Well-controlled hypertension Continue valsartan HCT 80-12.5 mg daily Eating better and losing weight Staying physically active

## 2023-03-18 NOTE — Assessment & Plan Note (Signed)
Stable Continue Gemtesa 75 mg daily and tamsulosin 0.4 mg daily

## 2023-03-18 NOTE — Patient Instructions (Signed)
Health Maintenance After Age 77 After age 77, you are at a higher risk for certain long-term diseases and infections as well as injuries from falls. Falls are a major cause of broken bones and head injuries in people who are older than age 77. Getting regular preventive care can help to keep you healthy and well. Preventive care includes getting regular testing and making lifestyle changes as recommended by your health care provider. Talk with your health care provider about: Which screenings and tests you should have. A screening is a test that checks for a disease when you have no symptoms. A diet and exercise plan that is right for you. What should I know about screenings and tests to prevent falls? Screening and testing are the best ways to find a health problem early. Early diagnosis and treatment give you the best chance of managing medical conditions that are common after age 77. Certain conditions and lifestyle choices may make you more likely to have a fall. Your health care provider may recommend: Regular vision checks. Poor vision and conditions such as cataracts can make you more likely to have a fall. If you wear glasses, make sure to get your prescription updated if your vision changes. Medicine review. Work with your health care provider to regularly review all of the medicines you are taking, including over-the-counter medicines. Ask your health care provider about any side effects that may make you more likely to have a fall. Tell your health care provider if any medicines that you take make you feel dizzy or sleepy. Strength and balance checks. Your health care provider may recommend certain tests to check your strength and balance while standing, walking, or changing positions. Foot health exam. Foot pain and numbness, as well as not wearing proper footwear, can make you more likely to have a fall. Screenings, including: Osteoporosis screening. Osteoporosis is a condition that causes  the bones to get weaker and break more easily. Blood pressure screening. Blood pressure changes and medicines to control blood pressure can make you feel dizzy. Depression screening. You may be more likely to have a fall if you have a fear of falling, feel depressed, or feel unable to do activities that you used to do. Alcohol use screening. Using too much alcohol can affect your balance and may make you more likely to have a fall. Follow these instructions at home: Lifestyle Do not drink alcohol if: Your health care provider tells you not to drink. If you drink alcohol: Limit how much you have to: 0-1 drink a day for women. 0-2 drinks a day for men. Know how much alcohol is in your drink. In the [place] one drink equals one 12 oz bottle of beer (355 mL), one 5 oz glass of wine (148 mL), or one 1 oz glass of hard liquor (44 mL). Do not use any products that contain nicotine or tobacco. These products include cigarettes, chewing tobacco, and vaping devices, such as e-cigarettes. If you need help quitting, ask your health care provider. Activity  Follow a regular exercise program to stay fit. This will help you maintain your balance. Ask your health care provider what types of exercise are appropriate for you. If you need a cane or walker, use it as recommended by your health care provider. Wear supportive shoes that have nonskid soles. Safety  Remove any tripping hazards, such as rugs, cords, and clutter. Install safety equipment such as grab bars in bathrooms and safety rails on stairs. Keep rooms and walkways   well-lit. General instructions Talk with your health care provider about your risks for falling. Tell your health care provider if: You fall. Be sure to tell your health care provider about all falls, even ones that seem minor. You feel dizzy, tiredness (fatigue), or off-balance. Take over-the-counter and prescription medicines only as told by your health care provider. These include  supplements. Eat a healthy diet and maintain a healthy weight. A healthy diet includes low-fat dairy products, low-fat (lean) meats, and fiber from whole grains, beans, and lots of fruits and vegetables. Stay current with your vaccines. Schedule regular health, dental, and eye exams. Summary Having a healthy lifestyle and getting preventive care can help to protect your health and wellness after age 77. Screening and testing are the best way to find a health problem early and help you avoid having a fall. Early diagnosis and treatment give you the best chance for managing medical conditions that are more common for people who are older than age 77. Falls are a major cause of broken bones and head injuries in people who are older than age 77. Take precautions to prevent a fall at home. Work with your health care provider to learn what changes you can make to improve your health and wellness and to prevent falls. This information is not intended to replace advice given to you by your health care provider. Make sure you discuss any questions you have with your health care provider. Document Revised: 01/07/2021 Document Reviewed: 01/07/2021 Elsevier Patient Education  2024 Elsevier Inc.  

## 2023-03-18 NOTE — Assessment & Plan Note (Signed)
 Chronic stable condition Lipid profile done today Diet and nutrition discussed Continue rosuvastatin 10 mg daily

## 2023-03-23 DIAGNOSIS — F411 Generalized anxiety disorder: Secondary | ICD-10-CM | POA: Diagnosis not present

## 2023-03-23 DIAGNOSIS — G4700 Insomnia, unspecified: Secondary | ICD-10-CM | POA: Diagnosis not present

## 2023-03-24 ENCOUNTER — Ambulatory Visit (AMBULATORY_SURGERY_CENTER): Payer: Medicare PPO

## 2023-03-24 ENCOUNTER — Telehealth: Payer: Self-pay

## 2023-03-24 ENCOUNTER — Encounter: Payer: Self-pay | Admitting: Internal Medicine

## 2023-03-24 VITALS — Ht 71.0 in | Wt 245.0 lb

## 2023-03-24 DIAGNOSIS — K22710 Barrett's esophagus with low grade dysplasia: Secondary | ICD-10-CM

## 2023-03-24 NOTE — Progress Notes (Signed)
No egg or soy allergy known to patient  No issues known to pt with past sedation with any surgeries or procedures Patient denies ever being told they had issues or difficulty with intubation  No FH of Malignant Hyperthermia Pt is not on diet pills Pt is not on  home 02  Pt is not on blood thinners  Pt denies issues with constipation  No A fib or A flutter Have any cardiac testing pending--no LOA: independent    Patient's chart reviewed by Cathlyn Parsons CNRA prior to previsit and patient appropriate for the [facility].  Previsit completed and red dot placed by patient's name on their procedure day (on provider's schedule).     PV competed with patient. Prep instructions sent via mychart and home address.

## 2023-03-24 NOTE — Telephone Encounter (Signed)
PV in progress  

## 2023-04-07 ENCOUNTER — Ambulatory Visit (AMBULATORY_SURGERY_CENTER): Payer: Medicare PPO | Admitting: Internal Medicine

## 2023-04-07 ENCOUNTER — Encounter: Payer: Self-pay | Admitting: Internal Medicine

## 2023-04-07 VITALS — BP 119/79 | HR 59 | Temp 98.9°F | Resp 26 | Ht 71.0 in | Wt 245.0 lb

## 2023-04-07 DIAGNOSIS — G4733 Obstructive sleep apnea (adult) (pediatric): Secondary | ICD-10-CM | POA: Diagnosis not present

## 2023-04-07 DIAGNOSIS — F419 Anxiety disorder, unspecified: Secondary | ICD-10-CM | POA: Diagnosis not present

## 2023-04-07 DIAGNOSIS — K219 Gastro-esophageal reflux disease without esophagitis: Secondary | ICD-10-CM

## 2023-04-07 DIAGNOSIS — K2270 Barrett's esophagus without dysplasia: Secondary | ICD-10-CM | POA: Diagnosis not present

## 2023-04-07 DIAGNOSIS — I10 Essential (primary) hypertension: Secondary | ICD-10-CM | POA: Diagnosis not present

## 2023-04-07 DIAGNOSIS — K22710 Barrett's esophagus with low grade dysplasia: Secondary | ICD-10-CM

## 2023-04-07 DIAGNOSIS — E785 Hyperlipidemia, unspecified: Secondary | ICD-10-CM | POA: Diagnosis not present

## 2023-04-07 MED ORDER — SODIUM CHLORIDE 0.9 % IV SOLN
500.0000 mL | Freq: Once | INTRAVENOUS | Status: DC
Start: 2023-04-07 — End: 2024-05-23

## 2023-04-07 NOTE — Progress Notes (Signed)
Sedate, gd SR, tolerated procedure well, VSS, report to RN 

## 2023-04-07 NOTE — Progress Notes (Signed)
Pt's states no medical or surgical changes since previsit or office visit. 

## 2023-04-07 NOTE — Op Note (Signed)
[facility] Patient Name: Gregg Smith Procedure Date: 04/07/2023 9:52 AM MRN: [id number] Endoscopist: Wilhemina Bonito. Marina Goodell , MD, [id number] Age: 77 Referring MD:  Date of Birth: 27-Jan-1946 Gender: Male Account #: [id number] Procedure:                Upper GI endoscopy with biopsies Indications:              Surveillance for malignancy due to personal history                            of Barrett's esophagus. 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 Has been under close                            surveillance. Last exam 2 years ago Medicines:                Monitored Anesthesia Care Procedure:                Pre-Anesthesia Assessment:                           - Prior to the procedure, a History and Physical                            was performed, and patient medications and                            allergies were reviewed. The patient's tolerance of                            previous anesthesia was also reviewed. The risks                            and benefits of the procedure and the sedation                            options and risks were discussed with the patient.                            All questions were answered, and informed consent                            was obtained. Prior Anticoagulants: The patient has                            taken no anticoagulant or antiplatelet agents. ASA                            Grade Assessment: II - A patient with mild systemic  disease. After reviewing the risks and benefits,                            the patient was deemed in satisfactory condition to                            undergo the procedure.                           After obtaining informed consent, the endoscope was                            passed under direct vision. Throughout the                            procedure, the  patient's blood pressure, pulse, and                            oxygen saturations were monitored continuously. The                            Olympus scope [id number] was introduced through the                            mouth, and advanced to the second part of duodenum.                            The upper GI endoscopy was accomplished without                            difficulty. The patient tolerated the procedure                            well. Scope In: Scope Out: Findings:                 The esophagus was grossly normal. No obvious                            Barrett's by endoscopic criteria. The area of                            previous nodule ablation was identified and                            biopsied. Biopsies were taken with a cold forceps                            for histology.                           The stomach was normal, save hiatal hernia.                           The examined duodenum was normal.  The cardia and gastric fundus were normal on                            retroflexion. Complications:            No immediate complications. Estimated Blood Loss:     Estimated blood loss: none. Impression:               - Normal esophagus. Biopsied.                           - Normal stomach.                           - Normal examined duodenum. Recommendation:           - Patient has a contact number available for                            emergencies. The signs and symptoms of potential                            delayed complications were discussed with the                            patient. Return to normal activities tomorrow.                            Written discharge instructions were provided to the                            patient.                           - Resume previous diet.                           - Continue present medications.                           - Await pathology results. Wilhemina Bonito. Marina Goodell, MD 04/07/2023  10:21:25 AM This report has been signed electronically.

## 2023-04-07 NOTE — Patient Instructions (Signed)
Resume previous diet and medications. Awaiting pathology results.  YOU HAD AN ENDOSCOPIC PROCEDURE TODAY AT [facility]:   Refer to the procedure report that was given to you for any specific questions about what was found during the examination.  If the procedure report does not answer your questions, please call your gastroenterologist to clarify.  If you requested that your care partner not be given the details of your procedure findings, then the procedure report has been included in a sealed envelope for you to review at your convenience later.  YOU SHOULD EXPECT: Some feelings of bloating in the abdomen. Passage of more gas than usual.  Walking can help get rid of the air that was put into your GI tract during the procedure and reduce the bloating. If you had a lower endoscopy (such as a colonoscopy or flexible sigmoidoscopy) you may notice spotting of blood in your stool or on the toilet paper. If you underwent a bowel prep for your procedure, you may not have a normal bowel movement for a few days.  Please Note:  You might notice some irritation and congestion in your nose or some drainage.  This is from the oxygen used during your procedure.  There is no need for concern and it should clear up in a day or so.  SYMPTOMS TO REPORT IMMEDIATELY:  Following upper endoscopy (EGD)  Vomiting of blood or coffee ground material  New chest pain or pain under the shoulder blades  Painful or persistently difficult swallowing  New shortness of breath  Fever of 100F or higher  Black, tarry-looking stools  For urgent or emergent issues, a gastroenterologist can be reached at any hour by calling [phone]. Do not use MyChart messaging for urgent concerns.    DIET:  We do recommend a small meal at first, but then you may proceed to your regular diet.  Drink plenty of fluids but you should avoid alcoholic beverages for 24 hours.  ACTIVITY:  You should plan to take it easy for the  rest of today and you should NOT DRIVE or use heavy machinery until tomorrow (because of the sedation medicines used during the test).    FOLLOW UP: Our staff will call the number listed on your records the next business day following your procedure.  We will call around 7:15- 8:00 am to check on you and address any questions or concerns that you may have regarding the information given to you following your procedure. If we do not reach you, we will leave a message.     If any biopsies were taken you will be contacted by phone or by letter within the next 1-3 weeks.  Please call us at [phone] if you have not heard about the biopsies in 3 weeks.    SIGNATURES/CONFIDENTIALITY: You and/or your care partner have signed paperwork which will be entered into your electronic medical record.  These signatures attest to the fact that that the information above on your After Visit Summary has been reviewed and is understood.  Full responsibility of the confidentiality of this discharge information lies with you and/or your care-partner.

## 2023-04-07 NOTE — Progress Notes (Signed)
Called to room to assist during endoscopic procedure.  Patient ID and intended procedure confirmed with present staff. Received instructions for my participation in the procedure from the performing physician.  

## 2023-04-07 NOTE — Progress Notes (Signed)
HISTORY OF PRESENT ILLNESS:  Gregg Smith is a 77 y.o. male with history of short segment Barrett's with dysplastic nodule ablated endoscopically.  Has been followed closely.  Now for relook endoscopy.  Last exam 2 years ago.  No complaints.  Continues on PPI  REVIEW OF SYSTEMS:  All non-GI ROS negative except for  Past Medical History:  Diagnosis Date   Allergy    seasonal    Anxiety    Arthritis    Barrett's esophagus    Cataract    Chicken pox    Colon polyps    hyperplastic   Diverticulosis    GERD (gastroesophageal reflux disease)    Hemorrhoids    Hyperlipidemia    Hypertension    Insomnia    Leg cramps    Measles    Mumps    Obstructive sleep apnea    Pneumonia    Sleep apnea    dental device    Past Surgical History:  Procedure Laterality Date   APPENDECTOMY  1957   CATARACT EXTRACTION     COLONOSCOPY     JOINT REPLACEMENT     TONSILLECTOMY     TONSILLECTOMY AND ADENOIDECTOMY  1954   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 His smoking use included cigarettes. He started smoking about 56 years ago. He has a 40 pack-year smoking history. He has never used smokeless tobacco. He reports current alcohol use of about 1.0 standard drink of alcohol per week. He reports that he does not use drugs.  family history includes Alzheimer's disease in his mother; Aortic aneurysm in his son; Cancer in his father and maternal grandmother; Heart disease in his maternal grandfather and son; Stomach cancer in his father and paternal grandfather.  Allergies  Allergen Reactions   Penicillins Hives and Rash    Has patient had a PCN reaction causing immediate rash, facial/tongue/throat swelling, SOB or lightheadedness  with hypotension:unsure Has patient had a PCN reaction causing severe rash involving mucus membranes or skin necrosis:unsure Has patient had a PCN reaction that required hospitalization:No Has patient had a PCN reaction occurring within the last 10 years:No If all of the above answers are "NO", then may proceed with Cephalosporin use. Childhood reaction        PHYSICAL EXAMINATION: Vital signs: BP (!) 138/91   Pulse 64   Temp 98.9 F (37.2 C)   Resp (!) 24   Ht 5\' 11"  (1.803 m)   Wt 245 lb (111.1 kg)   SpO2 96%   BMI 34.17 kg/m  General: Well-developed, well-nourished, no acute distress HEENT: Sclerae are anicteric, conjunctiva pink. Oral mucosa intact Lungs: Clear Heart: Regular Abdomen: soft, nontender, nondistended, no obvious ascites, no peritoneal signs, normal bowel sounds. No organomegaly. Extremities: No edema Psychiatric: alert and oriented x3. Cooperative     ASSESSMENT:  Short segment Barrett's with dysplastic nodule ablated endoscopically   PLAN: Surveillance EGD

## 2023-04-08 ENCOUNTER — Telehealth: Payer: Self-pay | Admitting: *Deleted

## 2023-04-08 NOTE — Telephone Encounter (Signed)
Post procedure follow up call placed, no answer and left VM.  

## 2023-04-09 ENCOUNTER — Ambulatory Visit (INDEPENDENT_AMBULATORY_CARE_PROVIDER_SITE_OTHER): Payer: Medicare PPO

## 2023-04-09 VITALS — Ht 71.0 in | Wt 245.0 lb

## 2023-04-09 DIAGNOSIS — Z0000 Encounter for general adult medical examination without abnormal findings: Secondary | ICD-10-CM

## 2023-04-09 DIAGNOSIS — Z87891 Personal history of nicotine dependence: Secondary | ICD-10-CM

## 2023-04-09 NOTE — Progress Notes (Signed)
Subjective:   Gregg Smith is a 77 y.o. male who presents for an Initial Medicare Annual Wellness Visit.  Visit Complete: Virtual  I connected with  [name] on 04/09/23 by a audio enabled telemedicine application and verified that I am speaking with the correct person using two identifiers.  Patient Location: Home  Provider Location: Office/Clinic  I discussed the limitations of evaluation and management by telemedicine. The patient expressed understanding and agreed to proceed.  Patient Medicare AWV questionnaire was completed by the patient on 04/05/2023; I have confirmed that all information answered by patient is correct and no changes since this date.  Vital Signs: Vital signs are patient reported.   Review of Systems    Cardiac Risk Factors include: advanced age (>60men, >14 women);hypertension;male gender;Other (see comment);dyslipidemia, Risk factor comments: Aortic Atherosclerosis, OSA     Objective:    Today's Vitals   04/09/23 0819  Weight: 245 lb (111.1 kg)  Height: 5\' 11"  (1.803 m)   Body mass index is 34.17 kg/m.     04/09/2023    8:24 AM 04/11/2021    2:55 PM 04/11/2019    5:55 PM 03/18/2019    8:12 AM 03/12/2019    5:26 PM 02/03/2019    3:44 PM 08/12/2016   12:31 AM  Advanced Directives  Does Patient Have a Medical Advance Directive? No No  No No No   Type of Advance Directive       Living will  Would patient like information on creating a medical advance directive? No - Patient declined No - Patient declined    Yes (MAU/Ambulatory/Procedural Areas - Information given)      Information is confidential and restricted. Go to Review Flowsheets to unlock data.    Current Medications (verified) Outpatient Encounter Medications as of 04/09/2023  Medication Sig   CLINPRO 5000 1.1 % PSTE Place onto teeth daily.   eszopiclone (LUNESTA) 1 MG TABS tablet Take 1 mg by mouth every evening.   GEMTESA 75 MG TABS TAKE 75 MG BY MOUTH DAILY.   L-Theanine  200 MG CAPS Take 200 mg by mouth daily.   lansoprazole (PREVACID) 30 MG capsule TAKE 1 CAPSULE (30 MG TOTAL) BY MOUTH 2 (TWO) TIMES DAILY BEFORE A MEAL.   Milk Thistle 1000 MG CAPS Take 1 capsule by mouth daily.   Multiple Vitamin (MULTIVITAMIN) tablet Take 1 tablet by mouth daily.   rosuvastatin (CRESTOR) 10 MG tablet TAKE 1 TABLET BY MOUTH EVERY DAY   SYMBICORT 80-4.5 MCG/ACT inhaler TAKE 2 PUFFS BY MOUTH TWICE A DAY   tamsulosin (FLOMAX) 0.4 MG CAPS capsule TAKE 1 CAPSULE BY MOUTH DAILY AT NIGHT.   valsartan-hydrochlorothiazide (DIOVAN-HCT) 80-12.5 MG tablet TAKE 1 TABLET BY MOUTH EVERY DAY   Facility-Administered Encounter Medications as of 04/09/2023  Medication   0.9 %  sodium chloride infusion    Allergies (verified) Penicillins   History: Past Medical History:  Diagnosis Date   Allergy    seasonal    Anxiety    Arthritis    Barrett's esophagus    Cataract    Chicken pox    Colon polyps    hyperplastic   Diverticulosis    GERD (gastroesophageal reflux disease)    Hemorrhoids    Hyperlipidemia    Hypertension    Insomnia    Leg cramps    Measles    Mumps    Obstructive sleep apnea    Pneumonia    Sleep apnea    dental device  Past Surgical History:  Procedure Laterality Date   APPENDECTOMY  1957   CATARACT EXTRACTION     COLONOSCOPY     JOINT REPLACEMENT     TONSILLECTOMY     TONSILLECTOMY AND ADENOIDECTOMY  1954   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   Procedure: LEFT TOTAL KNEE ARTHROPLASTY;  Surgeon: Durene Romans, MD;  Location: [facility];  Service: Orthopedics;  Laterality: Left;   UPPER GASTROINTESTINAL ENDOSCOPY     Family History  Problem Relation Age of Onset   Alzheimer's disease Mother    Cancer Father    Stomach cancer Father    Aortic aneurysm Son    Heart disease Son    Cancer Maternal Grandmother    Heart disease Maternal Grandfather    Stomach cancer Paternal Grandfather    Colon cancer Neg Hx    Colon polyps Neg Hx    Esophageal  cancer Neg Hx    Rectal cancer Neg Hx    Pancreatic cancer Neg Hx    Liver cancer Neg Hx    Social History   Socioeconomic History   Marital status: Widowed    Spouse name: [name]   Number of children: 1   Years of education: PHD   Highest education level: Doctorate  Occupational History   Occupation: Retired    Comment: Loss adjuster, chartered: [employer]  Tobacco Use   Smoking status: Former    Current packs/day: 0.00    Average packs/day: 1 pack/day for 40.0 years (40.0 ttl pk-yrs)    Types: Cigarettes    Start date: 01/19/1967    Quit date: 01/19/2007    Years since quitting: 16.2   Smokeless tobacco: Never  Vaping Use   Vaping status: Never Used  Substance and Sexual Activity   Alcohol use: Yes    Alcohol/week: 1.0 standard drink of alcohol    Types: 1 Cans of beer per week   Drug use: No   Sexual activity: Not on file  Other Topics Concern   Not on file  Social History Narrative   Patient lives alone in his home   Patient is retired   Patient has a Ph.D   Patient has one adult child.   Patient is right-handed.   Patient drinks 1 cup of tea and 1 cup of coffee daily    Social Determinants of Health   Financial Resource Strain: Low Risk  (04/05/2023)   Overall Financial Resource Strain (CARDIA)    Difficulty of Paying Living Expenses: Not hard at all  Food Insecurity: No Food Insecurity (04/05/2023)   Hunger Vital Sign    Worried About Running Out of Food in the Last Year: Never true    Ran Out of Food in the Last Year: Never true  Transportation Needs: No Transportation Needs (04/09/2023)   PRAPARE - Administrator, Civil Service (Medical): No    Lack of Transportation (Non-Medical): No  Physical Activity: Sufficiently Active (04/05/2023)   Exercise Vital Sign    Days of Exercise per Week: 5 days    Minutes of Exercise per Session: 60 min  Stress: No Stress Concern Present (04/05/2023)   [employer] of Occupational  Health - Occupational Stress Questionnaire    Feeling of Stress : Only a little  Recent Concern: Stress - Stress Concern Present (02/02/2023)   [employer] of Occupational Health - Occupational Stress Questionnaire    Feeling of Stress : To some extent  Social Connections:  Moderately Integrated (04/05/2023)   Social Connection and Isolation Panel [NHANES]    Frequency of Communication with Friends and Family: More than three times a week    Frequency of Social Gatherings with Friends and Family: More than three times a week    Attends Religious Services: More than 4 times per year    Active Member of [employer] or Organizations: Yes    Attends Banker Meetings: More than 4 times per year    Marital Status: Widowed  Recent Concern: Social Connections - Moderately Isolated (02/02/2023)   Social Connection and Isolation Panel [NHANES]    Frequency of Communication with Friends and Family: More than three times a week    Frequency of Social Gatherings with Friends and Family: More than three times a week    Attends Religious Services: More than 4 times per year    Active Member of [employer] or Organizations: No    Attends Banker Meetings: Not on file    Marital Status: Widowed    Tobacco Counseling Counseling given: Not Answered   Clinical Intake:  Pre-visit preparation completed: Yes  Pain : No/denies pain     BMI - recorded: 34.17 Nutritional Risks: None Diabetes: No  How often do you need to have someone help you when you read instructions, pamphlets, or other written materials from your doctor or pharmacy?: 1 - Never  Interpreter Needed?: No  Information entered by ::  , RMA   Activities of Daily Living    04/05/2023    8:23 PM  In your present state of health, do you have any difficulty performing the following activities:  Hearing? 0  Vision? 0  Difficulty concentrating or making decisions? 0  Walking or climbing stairs? 0  Dressing  or bathing? 0  Doing errands, shopping? 0  Preparing Food and eating ? N  Using the Toilet? N  In the past six months, have you accidently leaked urine? Y  Do you have problems with loss of bowel control? N  Managing your Medications? N  Managing your Finances? N  Housekeeping or managing your Housekeeping? N    Patient Care Team: Georgina Quint, MD as PCP - General (Internal Medicine)  Indicate any recent Medical Services you may have received from other than [facility] providers in the past year (date may be approximate).     Assessment:   This is a routine wellness examination for Justun.  Hearing/Vision screen Hearing Screening - Comments:: Denies hearing difficulties   Vision Screening - Comments:: Wears eyeglasses   Dietary issues and exercise activities discussed:     Goals Addressed   None   Depression Screen    04/09/2023    8:26 AM 03/18/2023    9:26 AM 02/03/2023    3:20 PM 09/17/2022    9:18 AM 03/17/2022    9:12 AM 12/16/2021    9:01 AM 09/05/2021    9:49 AM  PHQ 2/9 Scores  PHQ - 2 Score 0 0 0 0 0 0 0  PHQ- 9 Score 4          Fall Risk    04/05/2023    8:23 PM 03/18/2023    9:26 AM 02/03/2023    3:20 PM 09/17/2022    9:18 AM 03/17/2022    9:12 AM  Fall Risk   Falls in the past year? 0 0 0 0 0  Number falls in past yr: 0 0 0 0   Injury with Fall? 0 0 0  0   Risk for fall due to : No Fall Risks No Fall Risks No Fall Risks No Fall Risks   Follow up  Falls evaluation completed Falls evaluation completed Falls evaluation completed     MEDICARE RISK AT HOME:   TIMED UP AND GO:  Was the test performed? No    Cognitive Function:        04/09/2023    8:24 AM  6CIT Screen  What Year? 0 points  What month? 0 points  What time? 0 points  Count back from 20 0 points  Months in reverse 0 points  Repeat phrase 0 points  Total Score 0 points    Immunizations Immunization History  Administered Date(s) Administered   Fluad Quad(high Dose 65+)  05/31/2020   Influenza, High Dose Seasonal PF 05/15/2017, 05/13/2018, 04/19/2019   Influenza-Unspecified 06/01/2014, 07/07/2015, 06/02/2016, 04/19/2019, 05/18/2021   PFIZER(Purple Top)SARS-COV-2 Vaccination 10/14/2019, 11/08/2019, 05/26/2020, 12/09/2020, 05/19/2021   Pneumococcal Conjugate-13 10/06/2014   Pneumococcal Polysaccharide-23 06/18/2007, 08/03/2018   Tdap 04/14/2012, 08/14/2012   Zoster Recombinant(Shingrix) 03/05/2021, 05/08/2021   Zoster, Live 08/17/2008    TDAP status: Due, Education has been provided regarding the importance of this vaccine. Advised may receive this vaccine at local pharmacy or Health Dept. Aware to provide a copy of the vaccination record if obtained from local pharmacy or Health Dept. Verbalized acceptance and understanding.  Flu Vaccine status: Up to date  Pneumococcal vaccine status: Up to date  Covid-19 vaccine status: Completed vaccines  Qualifies for Shingles Vaccine? Yes   Zostavax completed Yes   Shingrix Completed?: Yes  Screening Tests Health Maintenance  Topic Date Due   Lung Cancer Screening  02/19/2021   COVID-19 Vaccine (6 - 2023-24 season) 05/02/2022   INFLUENZA VACCINE  04/02/2023   Medicare Annual Wellness (AWV)  04/08/2024   Pneumonia Vaccine 61+ Years old  Completed   Hepatitis C Screening  Completed   Zoster Vaccines- Shingrix  Completed   HPV VACCINES  Aged Out   DTaP/Tdap/Td  Discontinued   Colonoscopy  Discontinued    Health Maintenance  Health Maintenance Due  Topic Date Due   Lung Cancer Screening  02/19/2021   COVID-19 Vaccine (6 - 2023-24 season) 05/02/2022   INFLUENZA VACCINE  04/02/2023    Colorectal cancer screening: No longer required.   Lung Cancer Screening: (Low Dose CT Chest recommended if Age 66-80 years, 20 pack-year currently smoking OR have quit w/in 15years.) does qualify.   Lung Cancer Screening Referral: 04/09/2023  Additional Screening:  Hepatitis C Screening: does qualify; Completed  06/11/2017  Vision Screening: Recommended annual ophthalmology exams for early detection of glaucoma and other disorders of the eye. Is the patient up to date with their annual eye exam?  Yes  Who is the provider or what is the name of the office in which the patient attends annual eye exams? Dr. Burgess Estelle [facility]). If pt is not established with a provider, would they like to be referred to a provider to establish care? No .   Dental Screening: Recommended annual dental exams for proper oral hygiene   Community Resource Referral / Chronic Care Management: CRR required this visit?  No   CCM required this visit?  No    Plan:     I have personally reviewed and noted the following in the patient's chart:   Medical and social history Use of alcohol, tobacco or illicit drugs  Current medications and supplements including opioid prescriptions. Patient is not currently taking opioid prescriptions. Functional  ability and status Nutritional status Physical activity Advanced directives List of other physicians Hospitalizations, surgeries, and ER visits in previous 12 months Vitals Screenings to include cognitive, depression, and falls Referrals and appointments  In addition, I have reviewed and discussed with patient certain preventive protocols, quality metrics, and best practice recommendations. A written personalized care plan for preventive services as well as general preventive health recommendations were provided to patient.      L , CMA   04/09/2023   After Visit Summary: (MyChart) Due to this being a telephonic visit, the after visit summary with patients personalized plan was offered to patient via MyChart   Nurse Notes: Patient is due for Lung CT and referral has been placed today.  He has an up coming eye exam on May 29, 2023.  Patient has no other concerns today.

## 2023-04-09 NOTE — Patient Instructions (Addendum)
Mr. Gregg Smith , Thank you for taking time to come for your Medicare Wellness Visit. I appreciate your ongoing commitment to your health goals. Please review the following plan we discussed and let me know if I can assist you in the future.   Referrals/Orders/Follow-Ups/Clinician Recommendations: Remember to call and schedule your Lung CT screening at [facility] at [facility], [phone].  Also remember to get verification of vaccines give at pharmacy.  Keep up the good work and it was nice talking with you.  This is a list of the screening recommended for you and due dates:  Health Maintenance  Topic Date Due   Screening for Lung Cancer  02/19/2021   COVID-19 Vaccine (6 - 2023-24 season) 05/02/2022   Flu Shot  04/02/2023   Medicare Annual Wellness Visit  04/08/2024   Pneumonia Vaccine  Completed   Hepatitis C Screening  Completed   Zoster (Shingles) Vaccine  Completed   HPV Vaccine  Aged Out   DTaP/Tdap/Td vaccine  Discontinued   Colon Cancer Screening  Discontinued    Advanced directives: (Declined) Advance directive discussed with you today. Even though you declined this today, please call our office should you change your mind, and we can give you the proper paperwork for you to fill out.  Next Medicare Annual Wellness Visit scheduled for next year: Yes  Preventive Care 38 Years and Older, Male  Preventive care refers to lifestyle choices and visits with your health care provider that can promote health and wellness. What does preventive care include? A yearly physical exam. This is also called an annual well check. Dental exams once or twice a year. Routine eye exams. Ask your health care provider how often you should have your eyes checked. Personal lifestyle choices, including: Daily care of your teeth and gums. Regular physical activity. Eating a healthy diet. Avoiding tobacco and drug use. Limiting alcohol use. Practicing safe sex. Taking low  doses of aspirin every day. Taking vitamin and mineral supplements as recommended by your health care provider. What happens during an annual well check? The services and screenings done by your health care provider during your annual well check will depend on your age, overall health, lifestyle risk factors, and family history of disease. Counseling  Your health care provider may ask you questions about your: Alcohol use. Tobacco use. Drug use. Emotional well-being. Home and relationship well-being. Sexual activity. Eating habits. History of falls. Memory and ability to understand (cognition). Work and work Astronomer. Screening  You may have the following tests or measurements: Height, weight, and BMI. Blood pressure. Lipid and cholesterol levels. These may be checked every 5 years, or more frequently if you are over 67 years old. Skin check. Lung cancer screening. You may have this screening every year starting at age 38 if you have a 30-pack-year history of smoking and currently smoke or have quit within the past 15 years. Fecal occult blood test (FOBT) of the stool. You may have this test every year starting at age 16. Flexible sigmoidoscopy or colonoscopy. You may have a sigmoidoscopy every 5 years or a colonoscopy every 10 years starting at age 17. Prostate cancer screening. Recommendations will vary depending on your family history and other risks. Hepatitis C blood test. Hepatitis B blood test. Sexually transmitted disease (STD) testing. Diabetes screening. This is done by checking your blood sugar (glucose) after you have not eaten for a while (fasting). You may have this done every 1-3 years. Abdominal aortic aneurysm (AAA)  screening. You may need this if you are a current or former smoker. Osteoporosis. You may be screened starting at age 37 if you are at high risk. Talk with your health care provider about your test results, treatment options, and if necessary, the need  for more tests. Vaccines  Your health care provider may recommend certain vaccines, such as: Influenza vaccine. This is recommended every year. Tetanus, diphtheria, and acellular pertussis (Tdap, Td) vaccine. You may need a Td booster every 10 years. Zoster vaccine. You may need this after age 72. Pneumococcal 13-valent conjugate (PCV13) vaccine. One dose is recommended after age 9. Pneumococcal polysaccharide (PPSV23) vaccine. One dose is recommended after age 50. Talk to your health care provider about which screenings and vaccines you need and how often you need them. This information is not intended to replace advice given to you by your health care provider. Make sure you discuss any questions you have with your health care provider. Document Released: 09/14/2015 Document Revised: 05/07/2016 Document Reviewed: 06/19/2015 Elsevier Interactive Patient Education  2017 [employer].  Fall Prevention in the Home Falls can cause injuries. They can happen to people of all ages. There are many things you can do to make your home safe and to help prevent falls. What can I do on the outside of my home? Regularly fix the edges of walkways and driveways and fix any cracks. Remove anything that might make you trip as you walk through a door, such as a raised step or threshold. Trim any bushes or trees on the path to your home. Use bright outdoor lighting. Clear any walking paths of anything that might make someone trip, such as rocks or tools. Regularly check to see if handrails are loose or broken. Make sure that both sides of any steps have handrails. Any raised decks and porches should have guardrails on the edges. Have any leaves, snow, or ice cleared regularly. Use sand or salt on walking paths during winter. Clean up any spills in your garage right away. This includes oil or grease spills. What can I do in the bathroom? Use night lights. Install grab bars by the toilet and in the tub and  shower. Do not use towel bars as grab bars. Use non-skid mats or decals in the tub or shower. If you need to sit down in the shower, use a plastic, non-slip stool. Keep the floor dry. Clean up any water that spills on the floor as soon as it happens. Remove soap buildup in the tub or shower regularly. Attach bath mats securely with double-sided non-slip rug tape. Do not have throw rugs and other things on the floor that can make you trip. What can I do in the bedroom? Use night lights. Make sure that you have a light by your bed that is easy to reach. Do not use any sheets or blankets that are too big for your bed. They should not hang down onto the floor. Have a firm chair that has side arms. You can use this for support while you get dressed. Do not have throw rugs and other things on the floor that can make you trip. What can I do in the kitchen? Clean up any spills right away. Avoid walking on wet floors. Keep items that you use a lot in easy-to-reach places. If you need to reach something above you, use a strong step stool that has a grab bar. Keep electrical cords out of the way. Do not use floor polish or  wax that makes floors slippery. If you must use wax, use non-skid floor wax. Do not have throw rugs and other things on the floor that can make you trip. What can I do with my stairs? Do not leave any items on the stairs. Make sure that there are handrails on both sides of the stairs and use them. Fix handrails that are broken or loose. Make sure that handrails are as long as the stairways. Check any carpeting to make sure that it is firmly attached to the stairs. Fix any carpet that is loose or worn. Avoid having throw rugs at the top or bottom of the stairs. If you do have throw rugs, attach them to the floor with carpet tape. Make sure that you have a light switch at the top of the stairs and the bottom of the stairs. If you do not have them, ask someone to add them for  you. What else can I do to help prevent falls? Wear shoes that: Do not have high heels. Have rubber bottoms. Are comfortable and fit you well. Are closed at the toe. Do not wear sandals. If you use a stepladder: Make sure that it is fully opened. Do not climb a closed stepladder. Make sure that both sides of the stepladder are locked into place. Ask someone to hold it for you, if possible. Clearly mark and make sure that you can see: Any grab bars or handrails. First and last steps. Where the edge of each step is. Use tools that help you move around (mobility aids) if they are needed. These include: Canes. Walkers. Scooters. Crutches. Turn on the lights when you go into a dark area. Replace any light bulbs as soon as they burn out. Set up your furniture so you have a clear path. Avoid moving your furniture around. If any of your floors are uneven, fix them. If there are any pets around you, be aware of where they are. Review your medicines with your doctor. Some medicines can make you feel dizzy. This can increase your chance of falling. Ask your doctor what other things that you can do to help prevent falls. This information is not intended to replace advice given to you by your health care provider. Make sure you discuss any questions you have with your health care provider. Document Released: 06/14/2009 Document Revised: 01/24/2016 Document Reviewed: 09/22/2014 Elsevier Interactive Patient Education  2017 [employer].

## 2023-04-13 ENCOUNTER — Encounter: Payer: Self-pay | Admitting: Internal Medicine

## 2023-04-29 ENCOUNTER — Ambulatory Visit (HOSPITAL_BASED_OUTPATIENT_CLINIC_OR_DEPARTMENT_OTHER)
Admission: RE | Admit: 2023-04-29 | Discharge: 2023-04-29 | Disposition: A | Discharge status: Home / Self Care | Payer: Medicare PPO | Source: Ambulatory Visit | Attending: Emergency Medicine | Admitting: Emergency Medicine

## 2023-04-29 DIAGNOSIS — Z0000 Encounter for general adult medical examination without abnormal findings: Secondary | ICD-10-CM | POA: Diagnosis not present

## 2023-04-29 DIAGNOSIS — Z87891 Personal history of nicotine dependence: Secondary | ICD-10-CM | POA: Insufficient documentation

## 2023-05-11 DIAGNOSIS — G4700 Insomnia, unspecified: Secondary | ICD-10-CM | POA: Diagnosis not present

## 2023-05-11 DIAGNOSIS — F411 Generalized anxiety disorder: Secondary | ICD-10-CM | POA: Diagnosis not present

## 2023-05-19 ENCOUNTER — Ambulatory Visit: Payer: Medicare PPO | Admitting: Podiatry

## 2023-05-29 DIAGNOSIS — Z961 Presence of intraocular lens: Secondary | ICD-10-CM | POA: Diagnosis not present

## 2023-05-29 DIAGNOSIS — H52203 Unspecified astigmatism, bilateral: Secondary | ICD-10-CM | POA: Diagnosis not present

## 2023-05-29 DIAGNOSIS — H43813 Vitreous degeneration, bilateral: Secondary | ICD-10-CM | POA: Diagnosis not present

## 2023-05-29 DIAGNOSIS — H524 Presbyopia: Secondary | ICD-10-CM | POA: Diagnosis not present

## 2023-05-29 DIAGNOSIS — H5213 Myopia, bilateral: Secondary | ICD-10-CM | POA: Diagnosis not present

## 2023-05-29 DIAGNOSIS — H04123 Dry eye syndrome of bilateral lacrimal glands: Secondary | ICD-10-CM | POA: Diagnosis not present

## 2023-06-04 ENCOUNTER — Encounter: Payer: Self-pay | Admitting: Emergency Medicine

## 2023-06-04 ENCOUNTER — Ambulatory Visit: Payer: Medicare PPO | Admitting: Emergency Medicine

## 2023-06-04 VITALS — BP 120/76 | HR 80 | Temp 98.6°F | Ht 71.0 in | Wt 245.5 lb

## 2023-06-04 DIAGNOSIS — F411 Generalized anxiety disorder: Secondary | ICD-10-CM

## 2023-06-04 DIAGNOSIS — G8929 Other chronic pain: Secondary | ICD-10-CM

## 2023-06-04 DIAGNOSIS — E785 Hyperlipidemia, unspecified: Secondary | ICD-10-CM

## 2023-06-04 DIAGNOSIS — G4733 Obstructive sleep apnea (adult) (pediatric): Secondary | ICD-10-CM | POA: Diagnosis not present

## 2023-06-04 DIAGNOSIS — R053 Chronic cough: Secondary | ICD-10-CM

## 2023-06-04 DIAGNOSIS — I10 Essential (primary) hypertension: Secondary | ICD-10-CM

## 2023-06-04 DIAGNOSIS — I700 Atherosclerosis of aorta: Secondary | ICD-10-CM

## 2023-06-04 DIAGNOSIS — K219 Gastro-esophageal reflux disease without esophagitis: Secondary | ICD-10-CM | POA: Diagnosis not present

## 2023-06-04 DIAGNOSIS — J849 Interstitial pulmonary disease, unspecified: Secondary | ICD-10-CM

## 2023-06-04 DIAGNOSIS — J432 Centrilobular emphysema: Secondary | ICD-10-CM

## 2023-06-04 DIAGNOSIS — M5442 Lumbago with sciatica, left side: Secondary | ICD-10-CM

## 2023-06-04 MED ORDER — TRELEGY ELLIPTA 100-62.5-25 MCG/ACT IN AEPB
1.0000 | INHALATION_SPRAY | Freq: Every day | RESPIRATORY_TRACT | 11 refills | Status: DC
Start: 2023-06-04 — End: 2024-05-07

## 2023-06-04 NOTE — Assessment & Plan Note (Signed)
Incidental finding on recent chest CT Recommend pulmonary evaluation Referral placed today

## 2023-06-04 NOTE — Progress Notes (Signed)
Gregg Smith 77 y.o.   Chief Complaint  Patient presents with  . Cough  . Sore Throat    Patient states he has a lingering cough, sore throat and neck pain   . Back Pain    Sciatica pain     HISTORY OF PRESENT ILLNESS: This is a 77 y.o. male complaining of lingering intermittent cough and recent sore throat with neck pain Also occasional left-sided sciatica pain Recent CT chest for lung cancer screening shows COPD/emphysema and interstitial lung disease.  Also hepatic steatosis and aortic atherosclerosis. History of GERD on Prevacid twice a day No other complaints or medical concerns today  HPI   Prior to Admission medications   Medication Sig Start Date End Date Taking? Authorizing Provider  CLINPRO 5000 1.1 % PSTE Place onto teeth daily. 03/23/20  Yes [provider]  eszopiclone (LUNESTA) 1 MG TABS tablet Take 1 mg by mouth every evening. 11/12/22  Yes [provider]  GEMTESA 75 MG TABS TAKE 75 MG BY MOUTH DAILY. 12/17/22  Yes Jalaiya Oyster, Eilleen Kempf, MD  L-Theanine 200 MG CAPS Take 200 mg by mouth daily.   Yes [provider]  lansoprazole (PREVACID) 30 MG capsule TAKE 1 CAPSULE (30 MG TOTAL) BY MOUTH 2 (TWO) TIMES DAILY BEFORE A MEAL. 12/17/22  Yes Hilarie Fredrickson, MD  Milk Thistle 1000 MG CAPS Take 1 capsule by mouth daily.   Yes [provider]  Multiple Vitamin (MULTIVITAMIN) tablet Take 1 tablet by mouth daily.   Yes [provider]  rosuvastatin (CRESTOR) 10 MG tablet TAKE 1 TABLET BY MOUTH EVERY DAY 12/17/22  Yes Rosezetta Balderston, Eilleen Kempf, MD  SYMBICORT 80-4.5 MCG/ACT inhaler TAKE 2 PUFFS BY MOUTH TWICE A DAY 09/18/22  Yes Georgina Quint, MD  tamsulosin (FLOMAX) 0.4 MG CAPS capsule TAKE 1 CAPSULE BY MOUTH DAILY AT NIGHT. 03/05/21  Yes Georgina Quint, MD  valsartan-hydrochlorothiazide (DIOVAN-HCT) 80-12.5 MG tablet TAKE 1 TABLET BY MOUTH EVERY DAY 08/20/22  Yes Georgina Quint, MD    Allergies  Allergen Reactions   . Penicillins Hives and Rash    Has patient had a PCN reaction causing immediate rash, facial/tongue/throat swelling, SOB or lightheadedness with hypotension:unsure Has patient had a PCN reaction causing severe rash involving mucus membranes or skin necrosis:unsure Has patient had a PCN reaction that required hospitalization:No Has patient had a PCN reaction occurring within the last 10 years:No If all of the above answers are "NO", then may proceed with Cephalosporin use. Childhood reaction     Patient Active Problem List   Diagnosis Date Noted  . Centrilobular emphysema (HCC) 06/04/2023  . Overactive bladder 09/17/2022  . Aortic atherosclerosis (HCC) 03/17/2022  . Acute insomnia 10/23/2021  . Chronic left-sided low back pain with left-sided sciatica 03/05/2021  . Osteoarthritis of knee 01/31/2021  . 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  . Scoliosis deformity of spine 04/22/2019  . Generalized anxiety disorder   . Panic disorder   . Insomnia   . Spinal stenosis of lumbar region 11/30/2018  . GERD (gastroesophageal reflux disease) 08/14/2017  . 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 OSA (obstructive sleep apnea) 03/22/2014  . Snoring 05/09/2013  . Essential hypertension 02/11/2013  . Dyslipidemia 02/11/2013  . Arthritis 02/11/2013    Past Medical History:  Diagnosis Date  . Allergy    seasonal   . Anxiety   . Arthritis   . Barrett's esophagus   . Cataract   .  Chicken pox   . Colon polyps    hyperplastic  . Diverticulosis   . GERD (gastroesophageal reflux disease)   . Hemorrhoids   . Hyperlipidemia   . Hypertension   . Insomnia   . Leg cramps   . Measles   . Mumps   . Obstructive sleep apnea   . Pneumonia   . Sleep apnea    dental device    Past Surgical History:  Procedure Laterality Date  . APPENDECTOMY  1957  . CATARACT EXTRACTION    . COLONOSCOPY    . JOINT REPLACEMENT    . TONSILLECTOMY    . TONSILLECTOMY  AND ADENOIDECTOMY  1954  . 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   Procedure: LEFT TOTAL KNEE ARTHROPLASTY;  Surgeon: Durene Romans, MD;  Location: [facility];  Service: Orthopedics;  Laterality: Left;  . UPPER GASTROINTESTINAL ENDOSCOPY      Social History   Socioeconomic History  . Marital status: Widowed    Spouse name: [name]  . Number of children: 1  . Years of education: PHD  . Highest education level: Doctorate  Occupational History  . Occupation: Retired    Comment: Loss adjuster, chartered: [employer]  Tobacco Use  . Smoking status: Former    Current packs/day: 0.00    Average packs/day: 1 pack/day for 40.0 years (40.0 ttl pk-yrs)    Types: Cigarettes    Start date: 01/19/1967    Quit date: 01/19/2007    Years since quitting: 16.3  . Smokeless tobacco: Never  Vaping Use  . Vaping status: Never Used  Substance and Sexual Activity  . Alcohol use: Yes    Alcohol/week: 1.0 standard drink of alcohol    Types: 1 Cans of beer per week  . Drug use: No  . Sexual activity: Not on file  Other Topics Concern  . Not on file  Social History Narrative   Patient lives alone in his home   Patient is retired   Patient has a Ph.D   Patient has one adult child.   Patient is right-handed.   Patient drinks 1 cup of tea and 1 cup of coffee daily    Social Determinants of Health   Financial Resource Strain: Low Risk  (04/05/2023)   Overall Financial Resource Strain (CARDIA)   . Difficulty of Paying Living Expenses: Not hard at all  Food Insecurity: No Food Insecurity (04/05/2023)   Hunger Vital Sign   . Worried About Programme researcher, broadcasting/film/video in the Last Year: Never true   . Ran Out of Food in the Last Year: Never true  Transportation Needs: No Transportation Needs (04/09/2023)   PRAPARE - Transportation   . Lack of Transportation (Medical): No   . Lack of Transportation (Non-Medical): No  Physical Activity: Sufficiently Active (04/05/2023)   Exercise Vital Sign   .  Days of Exercise per Week: 5 days   . Minutes of Exercise per Session: 60 min  Stress: No Stress Concern Present (04/05/2023)   [employer] of Occupational Health - Occupational Stress Questionnaire   . Feeling of Stress : Only a little  Recent Concern: Stress - Stress Concern Present (02/02/2023)   [employer] of Occupational Health - Occupational Stress Questionnaire   . Feeling of Stress : To some extent  Social Connections: Moderately Integrated (04/05/2023)   Social Connection and Isolation Panel [NHANES]   . Frequency of Communication with Friends and Family: More than three times a week   .  Frequency of Social Gatherings with Friends and Family: More than three times a week   . Attends Religious Services: More than 4 times per year   . Active Member of Clubs or Organizations: Yes   . Attends Banker Meetings: More than 4 times per year   . Marital Status: Widowed  Recent Concern: Social Connections - Moderately Isolated (02/02/2023)   Social Connection and Isolation Panel [NHANES]   . Frequency of Communication with Friends and Family: More than three times a week   . Frequency of Social Gatherings with Friends and Family: More than three times a week   . Attends Religious Services: More than 4 times per year   . Active Member of Clubs or Organizations: No   . Attends Banker Meetings: Not on file   . Marital Status: Widowed  Intimate Partner Violence: Not At Risk (04/09/2023)   Humiliation, Afraid, Rape, and Kick questionnaire   . Fear of Current or Ex-Partner: No   . Emotionally Abused: No   . Physically Abused: No   . Sexually Abused: No    Family History  Problem Relation Age of Onset  . Alzheimer's disease Mother   . Cancer Father   . Stomach cancer Father   . Aortic aneurysm Son   . Heart disease Son   . Cancer Maternal Grandmother   . Heart disease Maternal Grandfather   . Stomach cancer Paternal Grandfather   . Colon cancer Neg Hx    . Colon polyps Neg Hx   . Esophageal cancer Neg Hx   . Rectal cancer Neg Hx   . Pancreatic cancer Neg Hx   . Liver cancer Neg Hx      Review of Systems  Constitutional: Negative.  Negative for chills and fever.  HENT:  Positive for sore throat. Negative for congestion.   Respiratory:  Positive for cough. Negative for shortness of breath.   Cardiovascular: Negative.  Negative for chest pain and palpitations.  Gastrointestinal:  Negative for abdominal pain, diarrhea, nausea and vomiting.  Genitourinary: Negative.  Negative for dysuria and hematuria.  Skin: Negative.  Negative for rash.  Neurological: Negative.  Negative for dizziness and headaches.  All other systems reviewed and are negative.  Today's Vitals   06/04/23 1341  BP: 120/76  Pulse: 80  Temp: 98.6 F (37 C)  TempSrc: Oral  SpO2: 96%  Weight: 245 lb 8 oz (111.4 kg)  Height: 5\' 11"  (1.803 m)   Body mass index is 34.24 kg/m.   Physical Exam Vitals reviewed.  Constitutional:      Appearance: He is well-developed.  HENT:     Head: Normocephalic.     Mouth/Throat:     Mouth: Mucous membranes are moist.     Pharynx: Oropharynx is clear. Posterior oropharyngeal erythema present. No oropharyngeal exudate.     Comments: Mild erythema of the uvula Eyes:     Extraocular Movements: Extraocular movements intact.     Conjunctiva/sclera: Conjunctivae normal.     Pupils: Pupils are equal, round, and reactive to light.  Cardiovascular:     Rate and Rhythm: Normal rate and regular rhythm.     Pulses: Normal pulses.     Heart sounds: Normal heart sounds.  Pulmonary:     Effort: Pulmonary effort is normal.     Breath sounds: Normal breath sounds.  Abdominal:     Palpations: Abdomen is soft.     Tenderness: There is no abdominal tenderness.  Musculoskeletal:  Cervical back: No tenderness.  Lymphadenopathy:     Cervical: No cervical adenopathy.  Skin:    General: Skin is warm and dry.     Capillary Refill:  Capillary refill takes less than 2 seconds.  Neurological:     General: No focal deficit present.     Mental Status: He is alert and oriented to person, place, and time.  Psychiatric:        Mood and Affect: Mood normal.        Behavior: Behavior normal.    ASSESSMENT & PLAN: A total of 47 minutes was spent with the patient and counseling/coordination of care regarding preparing for this visit, review of most recent office visit notes, review of multiple chronic medical conditions under management, review of all medications, review of most recent blood work results, cardiovascular risks associated with hypertension and dyslipidemia, education on nutrition, need for pulmonary evaluation, prognosis, documentation, and need for follow-up..  Problem List Items Addressed This Visit       Cardiovascular and Mediastinum   Essential hypertension    Well-controlled hypertension Continue valsartan hydrochlorothiazide 80-12.5 mg daily Cardiovascular risks associated with hypertension discussed      Aortic atherosclerosis (HCC)    Chronic stable condition Diet and nutrition discussed Continue rosuvastatin 10 mg daily        Respiratory   OSA (obstructive sleep apnea)    Stable on CPAP treatment      Centrilobular emphysema (HCC) - Primary    Contributing to chronic cough along with GERD Recommend switching from Symbicort to Trelegy      Relevant Medications   Fluticasone-Umeclidin-Vilant (TRELEGY ELLIPTA) 100-62.5-25 MCG/ACT AEPB   Other Relevant Orders   Ambulatory referral to Pulmonology   Interstitial lung disease (HCC)    Incidental finding on recent chest CT Recommend pulmonary evaluation Referral placed today      Relevant Medications   Fluticasone-Umeclidin-Vilant (TRELEGY ELLIPTA) 100-62.5-25 MCG/ACT AEPB   Other Relevant Orders   Ambulatory referral to Pulmonology     Digestive   GERD (gastroesophageal reflux disease)    Contributing to chronic cough Continue  Prevacid 30 mg twice a day Has history of Barrett's esophagus.  Follows up with GI doctor on a regular basis        Nervous and Auditory   Chronic left-sided low back pain with left-sided sciatica    Intermittent symptoms.  Well-controlled at present time.        Other   Dyslipidemia    Diet and nutrition discussed Continues rosuvastatin 10 mg daily      Generalized anxiety disorder    Much improved.  Off medication at present time      Chronic cough    Secondary to COPD and GERD. Continue medications as outlined above.      Patient Instructions  Chronic Obstructive Pulmonary Disease  Chronic obstructive pulmonary disease (COPD) is a long-term (chronic) lung problem. When you have COPD, it is hard for air to get in and out of your lungs. Usually the condition gets worse over time, and your lungs will never return to normal. There are things you can do to keep yourself as healthy as possible. What are the causes? Smoking. This is the most common cause. Certain genes passed from parent to child (inherited). What increases the risk? Being exposed to secondhand smoke from cigarettes, pipes, or cigars. Being exposed to chemicals and other irritants, such as fumes and dust in the work environment. Having chronic lung conditions or infections. What are  the signs or symptoms? Shortness of breath, especially during physical activity. A long-term cough with a large amount of thick mucus. Sometimes, the cough may not have any mucus (dry cough). Wheezing. Breathing quickly. Skin that looks gray or blue, especially in the fingers, toes, or lips. Feeling tired (fatigue). Weight loss. Chest tightness. Having infections often. Episodes when breathing symptoms become much worse (exacerbations). At the later stages of this disease, you may have swelling in the ankles, feet, or legs. How is this treated? Taking medicines. Quitting smoking, if you smoke. Rehabilitation. This  includes steps to make your body work better. It may involve a team of specialists. Doing exercises. Making changes to your diet. Using oxygen. Lung surgery. Lung transplant. Comfort measures (palliative care). Follow these instructions at home: Medicines Take over-the-counter and prescription medicines only as told by your doctor. Talk to your doctor before taking any cough or allergy medicines. You may need to avoid medicines that cause your lungs to be dry. Lifestyle If you smoke, stop smoking. Smoking makes the problem worse. Do not smoke or use any products that contain nicotine or tobacco. If you need help quitting, ask your doctor. Avoid being around things that make your breathing worse. This may include smoke, chemicals, and fumes. Stay active, but remember to rest as well. Learn and use tips on how to manage stress and control your breathing. Make sure you get enough sleep. Most adults need at least 7 hours of sleep every night. Eat healthy foods. Eat smaller meals more often. Rest before meals. Controlled breathing Learn and use tips on how to control your breathing as told by your doctor. Try: Breathing in (inhaling) through your nose for 1 second. Then, pucker your lips and breath out (exhale) through your lips for 2 seconds. Putting one hand on your belly (abdomen). Breathe in slowly through your nose for 1 second. Your hand on your belly should move out. Pucker your lips and breathe out slowly through your lips. Your hand on your belly should move in as you breathe out.  Controlled coughing Learn and use controlled coughing to clear mucus from your lungs. Follow these steps: Lean your head a little forward. Breathe in deeply. Try to hold your breath for 3 seconds. Keep your mouth slightly open while coughing 2 times. Spit any mucus out into a tissue. Rest and do the steps again 1 or 2 times as needed. General instructions Make sure you get all the shots (vaccines) that  your doctor recommends. Ask your doctor about a flu shot and a pneumonia shot. Use oxygen therapy and pulmonary rehabilitation if told by your doctor. If you need home oxygen therapy, ask your doctor if you should buy a tool to measure your oxygen level (oximeter). Make a COPD action plan with your doctor. This helps you to know what to do if you feel worse than usual. Manage any other conditions you have as told by your doctor. Avoid going outside when it is very hot, cold, or humid. Avoid people who have a sickness you can catch (contagious). Keep all follow-up visits. Contact a doctor if: You cough up more mucus than usual. There is a change in the color or thickness of the mucus. It is harder to breathe than usual. Your breathing is faster than usual. You have trouble sleeping. You need to use your medicines more often than usual. You have trouble doing your normal activities such as getting dressed or walking around the house. Get help right away  if: You have shortness of breath while resting. You have shortness of breath that stops you from: Being able to talk. Doing normal activities. Your chest hurts for longer than 5 minutes. Your skin color is more blue than usual. Your pulse oximeter shows that you have low oxygen for longer than 5 minutes. You have a fever. You feel too tired to breathe normally. These symptoms may represent a serious problem that is an emergency. Do not wait to see if the symptoms will go away. Get medical help right away. Call your local emergency services (911 in the [place] Do not drive yourself to the hospital. Summary Chronic obstructive pulmonary disease (COPD) is a long-term lung problem. The way your lungs work will never return to normal. Usually the condition gets worse over time. There are things you can do to keep yourself as healthy as possible. Take over-the-counter and prescription medicines only as told by your doctor. If you smoke, stop.  Smoking makes the problem worse. This information is not intended to replace advice given to you by your health care provider. Make sure you discuss any questions you have with your health care provider. Document Revised: 06/25/2020 Document Reviewed: 06/26/2020 Elsevier Patient Education  2024 Elsevier Inc.     Edwina Barth, MD [facility] at [facility]

## 2023-06-04 NOTE — Assessment & Plan Note (Signed)
Chronic stable condition Diet and nutrition discussed Continue rosuvastatin 10 mg daily

## 2023-06-04 NOTE — Assessment & Plan Note (Signed)
Secondary to COPD and GERD. Continue medications as outlined above.

## 2023-06-04 NOTE — Assessment & Plan Note (Signed)
Stable on CPAP treatment.

## 2023-06-04 NOTE — Assessment & Plan Note (Signed)
Intermittent symptoms.  Well-controlled at present time.

## 2023-06-04 NOTE — Assessment & Plan Note (Signed)
Contributing to chronic cough along with GERD Recommend switching from Symbicort to Trelegy

## 2023-06-04 NOTE — Assessment & Plan Note (Signed)
Much improved.  Off medication at present time

## 2023-06-04 NOTE — Assessment & Plan Note (Signed)
Well-controlled hypertension Continue valsartan hydrochlorothiazide 80-12.5 mg daily Cardiovascular risks associated with hypertension discussed

## 2023-06-04 NOTE — Patient Instructions (Signed)
Chronic Obstructive Pulmonary Disease  Chronic obstructive pulmonary disease (COPD) is a long-term (chronic) lung problem. When you have COPD, it is hard for air to get in and out of your lungs. Usually the condition gets worse over time, and your lungs will never return to normal. There are things you can do to keep yourself as healthy as possible. What are the causes? Smoking. This is the most common cause. Certain genes passed from parent to child (inherited). What increases the risk? Being exposed to secondhand smoke from cigarettes, pipes, or cigars. Being exposed to chemicals and other irritants, such as fumes and dust in the work environment. Having chronic lung conditions or infections. What are the signs or symptoms? Shortness of breath, especially during physical activity. A long-term cough with a large amount of thick mucus. Sometimes, the cough may not have any mucus (dry cough). Wheezing. Breathing quickly. Skin that looks gray or blue, especially in the fingers, toes, or lips. Feeling tired (fatigue). Weight loss. Chest tightness. Having infections often. Episodes when breathing symptoms become much worse (exacerbations). At the later stages of this disease, you may have swelling in the ankles, feet, or legs. How is this treated? Taking medicines. Quitting smoking, if you smoke. Rehabilitation. This includes steps to make your body work better. It may involve a team of specialists. Doing exercises. Making changes to your diet. Using oxygen. Lung surgery. Lung transplant. Comfort measures (palliative care). Follow these instructions at home: Medicines Take over-the-counter and prescription medicines only as told by your doctor. Talk to your doctor before taking any cough or allergy medicines. You may need to avoid medicines that cause your lungs to be dry. Lifestyle If you smoke, stop smoking. Smoking makes the problem worse. Do not smoke or use any products that  contain nicotine or tobacco. If you need help quitting, ask your doctor. Avoid being around things that make your breathing worse. This may include smoke, chemicals, and fumes. Stay active, but remember to rest as well. Learn and use tips on how to manage stress and control your breathing. Make sure you get enough sleep. Most adults need at least 7 hours of sleep every night. Eat healthy foods. Eat smaller meals more often. Rest before meals. Controlled breathing Learn and use tips on how to control your breathing as told by your doctor. Try: Breathing in (inhaling) through your nose for 1 second. Then, pucker your lips and breath out (exhale) through your lips for 2 seconds. Putting one hand on your belly (abdomen). Breathe in slowly through your nose for 1 second. Your hand on your belly should move out. Pucker your lips and breathe out slowly through your lips. Your hand on your belly should move in as you breathe out.  Controlled coughing Learn and use controlled coughing to clear mucus from your lungs. Follow these steps: Lean your head a little forward. Breathe in deeply. Try to hold your breath for 3 seconds. Keep your mouth slightly open while coughing 2 times. Spit any mucus out into a tissue. Rest and do the steps again 1 or 2 times as needed. General instructions Make sure you get all the shots (vaccines) that your doctor recommends. Ask your doctor about a flu shot and a pneumonia shot. Use oxygen therapy and pulmonary rehabilitation if told by your doctor. If you need home oxygen therapy, ask your doctor if you should buy a tool to measure your oxygen level (oximeter). Make a COPD action plan with your doctor. This helps you   to know what to do if you feel worse than usual. Manage any other conditions you have as told by your doctor. Avoid going outside when it is very hot, cold, or humid. Avoid people who have a sickness you can catch (contagious). Keep all follow-up  visits. Contact a doctor if: You cough up more mucus than usual. There is a change in the color or thickness of the mucus. It is harder to breathe than usual. Your breathing is faster than usual. You have trouble sleeping. You need to use your medicines more often than usual. You have trouble doing your normal activities such as getting dressed or walking around the house. Get help right away if: You have shortness of breath while resting. You have shortness of breath that stops you from: Being able to talk. Doing normal activities. Your chest hurts for longer than 5 minutes. Your skin color is more blue than usual. Your pulse oximeter shows that you have low oxygen for longer than 5 minutes. You have a fever. You feel too tired to breathe normally. These symptoms may represent a serious problem that is an emergency. Do not wait to see if the symptoms will go away. Get medical help right away. Call your local emergency services (911 in the [place] Do not drive yourself to the hospital. Summary Chronic obstructive pulmonary disease (COPD) is a long-term lung problem. The way your lungs work will never return to normal. Usually the condition gets worse over time. There are things you can do to keep yourself as healthy as possible. Take over-the-counter and prescription medicines only as told by your doctor. If you smoke, stop. Smoking makes the problem worse. This information is not intended to replace advice given to you by your health care provider. Make sure you discuss any questions you have with your health care provider. Document Revised: 06/25/2020 Document Reviewed: 06/26/2020 Elsevier Patient Education  2024 Elsevier Inc.  

## 2023-06-04 NOTE — Assessment & Plan Note (Signed)
Contributing to chronic cough Continue Prevacid 30 mg twice a day Has history of Barrett's esophagus.  Follows up with GI doctor on a regular basis

## 2023-06-04 NOTE — Assessment & Plan Note (Signed)
Diet and nutrition discussed Continues rosuvastatin 10 mg daily

## 2023-06-09 ENCOUNTER — Ambulatory Visit: Payer: Medicare PPO | Admitting: Podiatry

## 2023-06-09 ENCOUNTER — Encounter: Payer: Self-pay | Admitting: Podiatry

## 2023-06-09 VITALS — BP 156/86 | HR 71 | Temp 98.1°F | Resp 18 | Ht 70.0 in | Wt 245.0 lb

## 2023-06-09 DIAGNOSIS — M79674 Pain in right toe(s): Secondary | ICD-10-CM | POA: Diagnosis not present

## 2023-06-09 DIAGNOSIS — M109 Gout, unspecified: Secondary | ICD-10-CM | POA: Diagnosis not present

## 2023-06-09 DIAGNOSIS — M79675 Pain in left toe(s): Secondary | ICD-10-CM

## 2023-06-09 DIAGNOSIS — M7672 Peroneal tendinitis, left leg: Secondary | ICD-10-CM | POA: Diagnosis not present

## 2023-06-09 DIAGNOSIS — B351 Tinea unguium: Secondary | ICD-10-CM

## 2023-06-09 NOTE — Progress Notes (Signed)
Subjective:  Patient ID: Gregg Smith, male    DOB: 06-03-1946,  MRN: [id number]  Chief Complaint  Patient presents with   Nail Problem    PATIENT IS HERE FOR NAIL FUNGUS     77 y.o. male presents with the above complaint. History confirmed with patient.  Nails are thickened elongated causing discomfort again.  He also has a new issue of pain and swelling on the outside of the foot no known injury has been uncomfortable walking throughout the day, he had sciatica back in January that caused pins-and-needles numbness in the leg but this feels different  Objective:  Physical Exam: warm, good capillary refill, no trophic changes or ulcerative lesions, normal DP and PT pulses and normal sensory exam.  Nails with thickened elongated dystrophy subungual debris and yellow discoloration x 10.  Pain and swelling at the fifth metatarsal base along the peroneal tendon insertion no ecchymosis or bruising, none on the bone itself there is diffuse edema throughout the left foot   Bako pathology results with onychomycosis       Assessment:   1. Acute gout of left foot, unspecified cause   2. Peroneal tendinitis of left lower leg   3. Pain due to onychomycosis of toenails of both feet      Plan:  Patient was evaluated and treated and all questions answered.  Discussed the etiology and treatment options for the condition in detail with the patient.Recommended debridement of the nails today. Sharp and mechanical debridement performed of all painful and mycotic nails today. Nails debrided in length and thickness using a nail nipper to level of comfort.    We discussed pain and swelling he is having in his left foot we discussed the possibility of this being a gouty attack and I recommended checking a uric acid level and basic metabolic panel we also discussed that this may be peroneal insertional tendinitis and I recommended home physical therapy plan, he has been taking ibuprofen which has  helped and he will continue utilize this as needed.  I will see him back on a regular visit I discussed with him this is not better in about 6 weeks by Thanksgiving that he should call let me know for a sooner appointment or if it gets worse.   Return in about 3 months (around 09/09/2023) for painful thick fungal nails.

## 2023-06-09 NOTE — Patient Instructions (Signed)

## 2023-06-12 ENCOUNTER — Other Ambulatory Visit: Payer: Self-pay | Admitting: Internal Medicine

## 2023-06-12 ENCOUNTER — Other Ambulatory Visit: Payer: Self-pay | Admitting: Emergency Medicine

## 2023-06-12 DIAGNOSIS — K219 Gastro-esophageal reflux disease without esophagitis: Secondary | ICD-10-CM

## 2023-06-12 DIAGNOSIS — M109 Gout, unspecified: Secondary | ICD-10-CM | POA: Diagnosis not present

## 2023-06-12 DIAGNOSIS — R399 Unspecified symptoms and signs involving the genitourinary system: Secondary | ICD-10-CM

## 2023-06-12 DIAGNOSIS — N3281 Overactive bladder: Secondary | ICD-10-CM

## 2023-06-12 DIAGNOSIS — K2270 Barrett's esophagus without dysplasia: Secondary | ICD-10-CM

## 2023-06-13 LAB — URIC ACID: Uric Acid: 6.6 mg/dL (ref 3.8–8.4)

## 2023-06-13 LAB — BASIC METABOLIC PANEL
BUN/Creatinine Ratio: 20 (ref 10–24)
BUN: 24 mg/dL (ref 8–27)
CO2: 22 mmol/L (ref 20–29)
Calcium: 9.3 mg/dL (ref 8.6–10.2)
Chloride: 106 mmol/L (ref 96–106)
Creatinine, Ser: 1.21 mg/dL (ref 0.76–1.27)
Glucose: 92 mg/dL (ref 70–99)
Potassium: 4 mmol/L (ref 3.5–5.2)
Sodium: 141 mmol/L (ref 134–144)
eGFR: 62 mL/min/{1.73_m2} (ref >59)

## 2023-06-15 DIAGNOSIS — G4700 Insomnia, unspecified: Secondary | ICD-10-CM | POA: Diagnosis not present

## 2023-06-15 DIAGNOSIS — F411 Generalized anxiety disorder: Secondary | ICD-10-CM | POA: Diagnosis not present

## 2023-06-19 DIAGNOSIS — R350 Frequency of micturition: Secondary | ICD-10-CM | POA: Diagnosis not present

## 2023-06-19 DIAGNOSIS — R3912 Poor urinary stream: Secondary | ICD-10-CM | POA: Diagnosis not present

## 2023-06-19 DIAGNOSIS — N401 Enlarged prostate with lower urinary tract symptoms: Secondary | ICD-10-CM | POA: Diagnosis not present

## 2023-06-19 DIAGNOSIS — R351 Nocturia: Secondary | ICD-10-CM | POA: Diagnosis not present

## 2023-06-19 DIAGNOSIS — R3915 Urgency of urination: Secondary | ICD-10-CM | POA: Diagnosis not present

## 2023-06-30 DIAGNOSIS — R3915 Urgency of urination: Secondary | ICD-10-CM | POA: Diagnosis not present

## 2023-06-30 DIAGNOSIS — R350 Frequency of micturition: Secondary | ICD-10-CM | POA: Diagnosis not present

## 2023-06-30 DIAGNOSIS — R351 Nocturia: Secondary | ICD-10-CM | POA: Diagnosis not present

## 2023-06-30 DIAGNOSIS — R3912 Poor urinary stream: Secondary | ICD-10-CM | POA: Diagnosis not present

## 2023-06-30 DIAGNOSIS — N401 Enlarged prostate with lower urinary tract symptoms: Secondary | ICD-10-CM | POA: Diagnosis not present

## 2023-07-22 ENCOUNTER — Other Ambulatory Visit: Payer: Self-pay | Admitting: Emergency Medicine

## 2023-07-22 DIAGNOSIS — E785 Hyperlipidemia, unspecified: Secondary | ICD-10-CM

## 2023-08-04 DIAGNOSIS — R351 Nocturia: Secondary | ICD-10-CM | POA: Diagnosis not present

## 2023-08-04 DIAGNOSIS — R3915 Urgency of urination: Secondary | ICD-10-CM | POA: Diagnosis not present

## 2023-08-04 DIAGNOSIS — N401 Enlarged prostate with lower urinary tract symptoms: Secondary | ICD-10-CM | POA: Diagnosis not present

## 2023-08-04 DIAGNOSIS — R350 Frequency of micturition: Secondary | ICD-10-CM | POA: Diagnosis not present

## 2023-08-04 DIAGNOSIS — R3912 Poor urinary stream: Secondary | ICD-10-CM | POA: Diagnosis not present

## 2023-08-06 ENCOUNTER — Other Ambulatory Visit: Payer: Self-pay | Admitting: Emergency Medicine

## 2023-08-06 DIAGNOSIS — I10 Essential (primary) hypertension: Secondary | ICD-10-CM

## 2023-08-10 DIAGNOSIS — F411 Generalized anxiety disorder: Secondary | ICD-10-CM | POA: Diagnosis not present

## 2023-08-10 DIAGNOSIS — G4700 Insomnia, unspecified: Secondary | ICD-10-CM | POA: Diagnosis not present

## 2023-08-16 NOTE — Progress Notes (Unsigned)
Cardiology Office Note:    Date:  08/17/2023   ID:  Gregg Smith, DOB 05-24-1946, MRN [id number]  PCP:  Georgina Quint, MD  Cardiologist:  None  Electrophysiologist:  None   Referring MD: Georgina Quint, *   Chief Complaint  Patient presents with   Edema    History of Present Illness:    Gregg Smith is a 77 y.o. male with a hx of hypertension, hyperlipidemia, OSA, GERD, Barrett's esophagus who presents for follow-up.  He was referred by Dr. Alvy Bimler for evaluation of chest pain, initially seen 12/2019.  He reports that chest pain started 2 months ago.  States that it started more as upper abdominal pain but is since migrated into his chest.  Describes as dull aching pain in center of his chest.  Occurs intermittently.  Particularly notices it when he lies on his left side.  States is 2 out of 10 in intensity.  Typically would last for a couple of hours when it occurs.  He does not exercise regularly.  States the most exertion he does is gardening.  No clear relationship between chest pain and exertion.  Does report he gets dyspnea when walking upstairs or when he walks fast.  He denies any lightheadedness, syncope, palpitations, lower extremity edema.  He smoked up to 2 packs/day x 40 years, quit in 2008.  No history of heart disease in his immediate family.  Echocardiogram 02/15/2020 showed EF 60 to 65%, mild LVH, grade 1 diastolic dysfunction, normal RV function, mild aortic regurgitation.  Coronary CTA 02/20/2020 showed normal coronary arteries, calcium score 0, dilated pulmonary artery measuring up to 36 mm.  Echocardiogram 09/08/2022 shows normal biventricular function, no significant valvular disease.  Since last clinic visit, he reports he has been doing well.  He has lost 35 to 40 pounds.  He is going to [facility] 3 days/week, workout for about 25 minutes, including treadmill and weight machines.  Has also made dietary changes.  Continues to have lower extremity edema  but feels has improved.  Denies any chest pain, dyspnea, lightheadedness, syncope, or palpitations.    BP Readings from Last 3 Encounters:  08/17/23 134/78  06/09/23 (!) 156/86  06/04/23 120/76   Wt Readings from Last 3 Encounters:  08/17/23 246 lb (111.6 kg)  06/09/23 245 lb (111.1 kg)  06/04/23 245 lb 8 oz (111.4 kg)      Past Medical History:  Diagnosis Date   Allergy    seasonal    Anxiety    Arthritis    Barrett's esophagus    Cataract    Chicken pox    Colon polyps    hyperplastic   Diverticulosis    GERD (gastroesophageal reflux disease)    Hemorrhoids    Hyperlipidemia    Hypertension    Insomnia    Leg cramps    Measles    Mumps    Obstructive sleep apnea    Pneumonia    Sleep apnea    dental device    Past Surgical History:  Procedure Laterality Date   APPENDECTOMY  1957   CATARACT EXTRACTION     COLONOSCOPY     JOINT REPLACEMENT     TONSILLECTOMY     TONSILLECTOMY AND ADENOIDECTOMY  1954   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   eszopiclone (LUNESTA) 1 MG TABS tablet Take 1 mg by mouth every evening.   Fluticasone-Umeclidin-Vilant (TRELEGY ELLIPTA) 100-62.5-25 MCG/ACT AEPB Inhale 1 puff into the lungs daily.   GEMTESA 75 MG TABS TAKE 75 MG BY MOUTH DAILY.   L-Theanine 200 MG CAPS Take 200 mg by mouth daily.   lansoprazole (PREVACID) 30 MG capsule TAKE 1 CAPSULE (30 MG TOTAL) BY MOUTH 2 (TWO) TIMES DAILY BEFORE A MEAL.   Milk Thistle 1000 MG CAPS Take 1 capsule by mouth daily.   Multiple Vitamin (MULTIVITAMIN) tablet Take 1 tablet by mouth daily.   rosuvastatin (CRESTOR) 10 MG tablet TAKE 1 TABLET BY MOUTH EVERY DAY   tamsulosin (FLOMAX) 0.4 MG CAPS capsule TAKE 1 CAPSULE  BY MOUTH DAILY AT NIGHT.   valsartan-hydrochlorothiazide (DIOVAN-HCT) 80-12.5 MG tablet TAKE 1 TABLET BY MOUTH EVERY DAY   Current Facility-Administered Medications for the 08/17/23 encounter (Office Visit) with Little Ishikawa, MD  Medication   0.9 %  sodium chloride infusion     Allergies:   Penicillins   Social History   Socioeconomic History   Marital status: Widowed    Spouse name: [name]   Number of children: 1   Years of education: PHD   Highest education level: Doctorate  Occupational History   Occupation: Retired    Comment: Loss adjuster, chartered: [employer]  Tobacco Use   Smoking status: Former    Current packs/day: 0.00    Average packs/day: 1 pack/day for 40.0 years (40.0 ttl pk-yrs)    Types: Cigarettes    Start date: 01/19/1967    Quit date: 01/19/2007    Years since quitting: 16.5   Smokeless tobacco: Never  Vaping Use   Vaping status: Never Used  Substance and Sexual Activity   Alcohol use: Yes    Alcohol/week: 1.0 standard drink of alcohol    Types: 1 Cans of beer per week   Drug use: No   Sexual activity: Not on file  Other Topics Concern   Not on file  Social History Narrative   Patient lives alone in his home   Patient is retired   Patient has a Ph.D   Patient has one adult child.   Patient is right-handed.   Patient drinks 1 cup of tea and 1 cup of coffee daily    Social Drivers of Health   Financial Resource Strain: Low Risk  (04/05/2023)   Overall Financial Resource Strain (CARDIA)    Difficulty of Paying Living Expenses: Not hard at all  Food Insecurity: No Food Insecurity (04/05/2023)   Hunger Vital Sign    Worried About Running Out of Food in the Last Year: Never true    Ran Out of Food in the Last Year: Never true  Transportation Needs: No Transportation Needs (04/09/2023)   PRAPARE - Administrator, Civil Service (Medical): No    Lack of Transportation (Non-Medical): No  Physical Activity:  Sufficiently Active (04/05/2023)   Exercise Vital Sign    Days of Exercise per Week: 5 days    Minutes of Exercise per Session: 60 min  Stress: No Stress Concern Present (04/05/2023)   [employer] of Occupational Health - Occupational Stress Questionnaire    Feeling of Stress : Only a little  Recent Concern: Stress - Stress Concern Present (02/02/2023)   [employer] of Occupational  Health - Occupational Stress Questionnaire    Feeling of Stress : To some extent  Social Connections: Moderately Integrated (04/05/2023)   Social Connection and Isolation Panel [NHANES]    Frequency of Communication with Friends and Family: More than three times a week    Frequency of Social Gatherings with Friends and Family: More than three times a week    Attends Religious Services: More than 4 times per year    Active Member of [employer] or Organizations: Yes    Attends Banker Meetings: More than 4 times per year    Marital Status: Widowed  Recent Concern: Social Connections - Moderately Isolated (02/02/2023)   Social Connection and Isolation Panel [NHANES]    Frequency of Communication with Friends and Family: More than three times a week    Frequency of Social Gatherings with Friends and Family: More than three times a week    Attends Religious Services: More than 4 times per year    Active Member of [employer] or Organizations: No    Attends Banker Meetings: Not on file    Marital Status: Widowed     Family History: The patient's family history includes Alzheimer's disease in his mother; Aortic aneurysm in his son; Cancer in his father and maternal grandmother; Heart disease in his maternal grandfather and son; Stomach cancer in his father and paternal grandfather. There is no history of Colon cancer, Colon polyps, Esophageal cancer, Rectal cancer, Pancreatic cancer, or Liver cancer.  ROS:   Please see the history of present illness.     All other systems reviewed and are  negative.  EKGs/Labs/Other Studies Reviewed:    The following studies were reviewed today:  EKG:   08/13/2022: Normal sinus rhythm, rate 77, no ST abnormality 08/17/2023: Normal sinus rhythm, rate 65, no ST abnormality  Recent Labs: 03/18/2023: ALT 27; Hemoglobin 13.9; Platelets 191.0 06/12/2023: BUN 24; Creatinine, Ser 1.21; Potassium 4.0; Sodium 141  Recent Lipid Panel    Component Value Date/Time   CHOL 144 03/18/2023 1004   CHOL 143 03/06/2020 1012   TRIG 89.0 03/18/2023 1004   HDL 39.00 (L) 03/18/2023 1004   HDL 37 (L) 03/06/2020 1012   CHOLHDL 4 03/18/2023 1004   VLDL 17.8 03/18/2023 1004   LDLCALC 87 03/18/2023 1004   LDLCALC 83 03/06/2020 1012   LDLDIRECT 91 02/17/2014 1153    Physical Exam:    VS:  BP 134/78   Pulse 65   Ht 5\' 11"  (1.803 m)   Wt 246 lb (111.6 kg)   SpO2 95%   BMI 34.31 kg/m     Wt Readings from Last 3 Encounters:  08/17/23 246 lb (111.6 kg)  06/09/23 245 lb (111.1 kg)  06/04/23 245 lb 8 oz (111.4 kg)     GEN:  Well nourished, well developed in no acute distress HEENT: Normal NECK: No JVD; No carotid bruits CARDIAC: RRR, no murmurs, rubs, gallops RESPIRATORY:  Clear to auscultation without rales, wheezing or rhonchi  ABDOMEN: Soft, non-tender, non-distended MUSCULOSKELETAL: Trace BLE edema SKIN: Warm and dry NEUROLOGIC:  Alert and oriented x 3 PSYCHIATRIC:  Normal affect   ASSESSMENT:    1. Bilateral leg edema   2. Essential hypertension   3. Hyperlipidemia, unspecified hyperlipidemia type   4. OSA (obstructive sleep apnea)      PLAN:    Chest pain/dyspnea on exertion: Echocardiogram 02/15/2020 showed EF 60 to 65%, mild LVH, grade 1 diastolic dysfunction, normal RV function, mild aortic regurgitation.  Coronary CTA  02/20/2020 showed normal coronary arteries, calcium score 0, dilated pulmonary artery measuring up to 36 mm. -Reports symptoms have improved.  Denies any chest pain or dyspnea  Lower extremity edema: Mild bilateral  lower extremity edema.  No JVD and lungs CTAB.  Suspect venous insufficiency.  Echocardiogram 09/08/2022 shows normal biventricular function, no significant valvular disease.normal albumin, BNP 08/2022 -Edema has improved with weight loss.  Recommend compression stockings  Hypertension: On valsartan-HCTZ 80-12.5 mg daily.  Appears controlled  Hyperlipidemia: On rosuvastatin 10 mg daily.  LDL 87 on 03/18/2023.  Calcium score 0 on 02/20/2020  OSA: uses oral appliance, did not need CPAP.  Reports has not been using oral appliance recently given has had some dental work.  Recommend discussing repeat sleep study with his sleep doctor as he may have had improvement in his OSA with his weight loss  Obesity: Body mass index is 34.31 kg/m.  Has lost 35-40 pounds.  Congratulated patient on weight loss and encouraged continued diet/exercise   RTC in 1 year   Medication Adjustments/Labs and Tests Ordered: Current medicines are reviewed at length with the patient today.  Concerns regarding medicines are outlined above.  Orders Placed This Encounter  Procedures   EKG 12-Lead   No orders of the defined types were placed in this encounter.   Patient Instructions  Medication Instructions:  Continue current medicaitons *If you need a refill on your cardiac medications before your next appointment, please call your pharmacy*   Lab Work: none If you have labs (blood work) drawn today and your tests are completely normal, you will receive your results only by: MyChart Message (if you have MyChart) OR A paper copy in the mail If you have any lab test that is abnormal or we need to change your treatment, we will call you to review the results.   Testing/Procedures: none   Follow-Up: At [facility], you and your health needs are our priority.  As part of our continuing mission to provide you with exceptional heart care, we have created designated Provider Care Teams.  These Care Teams  include your primary Cardiologist (physician) and Advanced Practice Providers (APPs -  Physician Assistants and Nurse Practitioners) who all work together to provide you with the care you need, when you need it.  We recommend signing up for the patient portal called "MyChart".  Sign up information is provided on this After Visit Summary.  MyChart is used to connect with patients for Virtual Visits (Telemedicine).  Patients are able to view lab/test results, encounter notes, upcoming appointments, etc.  Non-urgent messages can be sent to your provider as well.   To learn more about what you can do with MyChart, go to [email or web address].    Your next appointment:   1 year(s)  Provider:   Dr. Bjorn Pippin  Other Instructions Your provider recommendations that you purchase some compression Stockings Please put on in morning ( awake) and take off at bedtime    Signed, Little Ishikawa, MD  08/17/2023 9:46 AM    [facility]

## 2023-08-17 ENCOUNTER — Ambulatory Visit: Payer: Medicare PPO | Attending: Cardiology | Admitting: Cardiology

## 2023-08-17 ENCOUNTER — Encounter: Payer: Self-pay | Admitting: Cardiology

## 2023-08-17 VITALS — BP 134/78 | HR 65 | Ht 71.0 in | Wt 246.0 lb

## 2023-08-17 DIAGNOSIS — G4733 Obstructive sleep apnea (adult) (pediatric): Secondary | ICD-10-CM

## 2023-08-17 DIAGNOSIS — E785 Hyperlipidemia, unspecified: Secondary | ICD-10-CM | POA: Diagnosis not present

## 2023-08-17 DIAGNOSIS — I10 Essential (primary) hypertension: Secondary | ICD-10-CM

## 2023-08-17 DIAGNOSIS — R600 Localized edema: Secondary | ICD-10-CM

## 2023-08-17 NOTE — Patient Instructions (Signed)
Medication Instructions:  Continue current medicaitons *If you need a refill on your cardiac medications before your next appointment, please call your pharmacy*   Lab Work: none If you have labs (blood work) drawn today and your tests are completely normal, you will receive your results only by: MyChart Message (if you have MyChart) OR A paper copy in the mail If you have any lab test that is abnormal or we need to change your treatment, we will call you to review the results.   Testing/Procedures: none   Follow-Up: At [facility], you and your health needs are our priority.  As part of our continuing mission to provide you with exceptional heart care, we have created designated Provider Care Teams.  These Care Teams include your primary Cardiologist (physician) and Advanced Practice Providers (APPs -  Physician Assistants and Nurse Practitioners) who all work together to provide you with the care you need, when you need it.  We recommend signing up for the patient portal called "MyChart".  Sign up information is provided on this After Visit Summary.  MyChart is used to connect with patients for Virtual Visits (Telemedicine).  Patients are able to view lab/test results, encounter notes, upcoming appointments, etc.  Non-urgent messages can be sent to your provider as well.   To learn more about what you can do with MyChart, go to [email or web address].    Your next appointment:   1 year(s)  Provider:   Dr. Bjorn Pippin  Other Instructions Your provider recommendations that you purchase some compression Stockings Please put on in morning ( awake) and take off at bedtime

## 2023-09-21 ENCOUNTER — Ambulatory Visit: Payer: Medicare PPO | Admitting: Emergency Medicine

## 2023-09-21 ENCOUNTER — Encounter: Payer: Self-pay | Admitting: Emergency Medicine

## 2023-09-21 VITALS — BP 128/74 | HR 72 | Temp 98.4°F | Ht 71.0 in | Wt 250.0 lb

## 2023-09-21 DIAGNOSIS — G4733 Obstructive sleep apnea (adult) (pediatric): Secondary | ICD-10-CM | POA: Diagnosis not present

## 2023-09-21 DIAGNOSIS — M5442 Lumbago with sciatica, left side: Secondary | ICD-10-CM | POA: Diagnosis not present

## 2023-09-21 DIAGNOSIS — J849 Interstitial pulmonary disease, unspecified: Secondary | ICD-10-CM

## 2023-09-21 DIAGNOSIS — E785 Hyperlipidemia, unspecified: Secondary | ICD-10-CM

## 2023-09-21 DIAGNOSIS — M25561 Pain in right knee: Secondary | ICD-10-CM

## 2023-09-21 DIAGNOSIS — K219 Gastro-esophageal reflux disease without esophagitis: Secondary | ICD-10-CM

## 2023-09-21 DIAGNOSIS — I10 Essential (primary) hypertension: Secondary | ICD-10-CM | POA: Diagnosis not present

## 2023-09-21 DIAGNOSIS — N3281 Overactive bladder: Secondary | ICD-10-CM

## 2023-09-21 DIAGNOSIS — G8929 Other chronic pain: Secondary | ICD-10-CM | POA: Insufficient documentation

## 2023-09-21 DIAGNOSIS — J432 Centrilobular emphysema: Secondary | ICD-10-CM | POA: Diagnosis not present

## 2023-09-21 DIAGNOSIS — F411 Generalized anxiety disorder: Secondary | ICD-10-CM

## 2023-09-21 DIAGNOSIS — I700 Atherosclerosis of aorta: Secondary | ICD-10-CM | POA: Diagnosis not present

## 2023-09-21 NOTE — Assessment & Plan Note (Signed)
Chronic stable condition.  No concerns.

## 2023-09-21 NOTE — Assessment & Plan Note (Signed)
 Stable on CPAP treatment.

## 2023-09-21 NOTE — Assessment & Plan Note (Signed)
Chronic stable condition Recently seen by GI and had upper endoscopy done Continues Prevacid 30 mg daily

## 2023-09-21 NOTE — Patient Instructions (Signed)
Health Maintenance After Age 78 After age 78, you are at a higher risk for certain long-term diseases and infections as well as injuries from falls. Falls are a major cause of broken bones and head injuries in people who are older than age 78. Getting regular preventive care can help to keep you healthy and well. Preventive care includes getting regular testing and making lifestyle changes as recommended by your health care provider. Talk with your health care provider about: Which screenings and tests you should have. A screening is a test that checks for a disease when you have no symptoms. A diet and exercise plan that is right for you. What should I know about screenings and tests to prevent falls? Screening and testing are the best ways to find a health problem early. Early diagnosis and treatment give you the best chance of managing medical conditions that are common after age 78. Certain conditions and lifestyle choices may make you more likely to have a fall. Your health care provider may recommend: Regular vision checks. Poor vision and conditions such as cataracts can make you more likely to have a fall. If you wear glasses, make sure to get your prescription updated if your vision changes. Medicine review. Work with your health care provider to regularly review all of the medicines you are taking, including over-the-counter medicines. Ask your health care provider about any side effects that may make you more likely to have a fall. Tell your health care provider if any medicines that you take make you feel dizzy or sleepy. Strength and balance checks. Your health care provider may recommend certain tests to check your strength and balance while standing, walking, or changing positions. Foot health exam. Foot pain and numbness, as well as not wearing proper footwear, can make you more likely to have a fall. Screenings, including: Osteoporosis screening. Osteoporosis is a condition that causes  the bones to get weaker and break more easily. Blood pressure screening. Blood pressure changes and medicines to control blood pressure can make you feel dizzy. Depression screening. You may be more likely to have a fall if you have a fear of falling, feel depressed, or feel unable to do activities that you used to do. Alcohol use screening. Using too much alcohol can affect your balance and may make you more likely to have a fall. Follow these instructions at home: Lifestyle Do not drink alcohol if: Your health care provider tells you not to drink. If you drink alcohol: Limit how much you have to: 0-1 drink a day for women. 0-2 drinks a day for men. Know how much alcohol is in your drink. In the [place] one drink equals one 12 oz bottle of beer (355 mL), one 5 oz glass of wine (148 mL), or one 1 oz glass of hard liquor (44 mL). Do not use any products that contain nicotine or tobacco. These products include cigarettes, chewing tobacco, and vaping devices, such as e-cigarettes. If you need help quitting, ask your health care provider. Activity  Follow a regular exercise program to stay fit. This will help you maintain your balance. Ask your health care provider what types of exercise are appropriate for you. If you need a cane or walker, use it as recommended by your health care provider. Wear supportive shoes that have nonskid soles. Safety  Remove any tripping hazards, such as rugs, cords, and clutter. Install safety equipment such as grab bars in bathrooms and safety rails on stairs. Keep rooms and walkways   well-lit. General instructions Talk with your health care provider about your risks for falling. Tell your health care provider if: You fall. Be sure to tell your health care provider about all falls, even ones that seem minor. You feel dizzy, tiredness (fatigue), or off-balance. Take over-the-counter and prescription medicines only as told by your health care provider. These include  supplements. Eat a healthy diet and maintain a healthy weight. A healthy diet includes low-fat dairy products, low-fat (lean) meats, and fiber from whole grains, beans, and lots of fruits and vegetables. Stay current with your vaccines. Schedule regular health, dental, and eye exams. Summary Having a healthy lifestyle and getting preventive care can help to protect your health and wellness after age 78. Screening and testing are the best way to find a health problem early and help you avoid having a fall. Early diagnosis and treatment give you the best chance for managing medical conditions that are more common for people who are older than age 78. Falls are a major cause of broken bones and head injuries in people who are older than age 78. Take precautions to prevent a fall at home. Work with your health care provider to learn what changes you can make to improve your health and wellness and to prevent falls. This information is not intended to replace advice given to you by your health care provider. Make sure you discuss any questions you have with your health care provider. Document Revised: 01/07/2021 Document Reviewed: 01/07/2021 Elsevier Patient Education  2024 [employer]  

## 2023-09-21 NOTE — Assessment & Plan Note (Signed)
Diet and nutrition discussed Continues rosuvastatin 10 mg daily

## 2023-09-21 NOTE — Assessment & Plan Note (Signed)
Chronic stable condition Was evaluated by pulmonary

## 2023-09-21 NOTE — Assessment & Plan Note (Signed)
Stable Continue Gemtesa 75 mg daily and tamsulosin 0.4 mg daily

## 2023-09-21 NOTE — Assessment & Plan Note (Signed)
Chronic stable condition Diet and nutrition discussed Continue rosuvastatin 10 mg daily

## 2023-09-21 NOTE — Assessment & Plan Note (Signed)
 Stable chronic condition.   Continues daily Trelegy.

## 2023-09-21 NOTE — Assessment & Plan Note (Signed)
Stable and off medication.  Sees therapist on a regular basis. However situational anxiety triggered by son's situation contributing

## 2023-09-21 NOTE — Progress Notes (Signed)
Gregg Smith 78 y.o.   Chief Complaint  Patient presents with   Follow-up    6 month f/u. Patient c/o of right knee pain and wanted to talk about. Has a swollen prostate test on feb 24 just wants to discuss this and also his weight, he wants to lose more     HISTORY OF PRESENT ILLNESS: This is a 78 y.o. male here for 51-month follow-up of multiple chronic medical conditions Still complaining of chronic pain to right knee Has been exercising more, going to the [facility], eating better. Stressful situation dealing with son.  Sees therapist on a regular basis. No other complaints or medical concerns today.  HPI   Prior to Admission medications   Medication Sig Start Date End Date Taking? Authorizing Provider  BOOSTRIX 5-2.5-18.5 LF-MCG/0.5 injection Inject 0.5 mLs into the muscle once. 04/10/23  Yes [provider]  CLINPRO 5000 1.1 % PSTE Place onto teeth daily. 03/23/20  Yes [provider]  eszopiclone (LUNESTA) 1 MG TABS tablet Take 1 mg by mouth every evening. 11/12/22  Yes [provider]  Fluticasone-Umeclidin-Vilant (TRELEGY ELLIPTA) 100-62.5-25 MCG/ACT AEPB Inhale 1 puff into the lungs daily. 06/04/23  Yes Kamaal Cast, Eilleen Kempf, MD  GEMTESA 75 MG TABS TAKE 75 MG BY MOUTH DAILY. 06/12/23  Yes Cori Justus, Eilleen Kempf, MD  L-Theanine 200 MG CAPS Take 200 mg by mouth daily.   Yes [provider]  lansoprazole (PREVACID) 30 MG capsule TAKE 1 CAPSULE (30 MG TOTAL) BY MOUTH 2 (TWO) TIMES DAILY BEFORE A MEAL. 06/12/23  Yes Hilarie Fredrickson, MD  Milk Thistle 1000 MG CAPS Take 1 capsule by mouth daily.   Yes [provider]  Multiple Vitamin (MULTIVITAMIN) tablet Take 1 tablet by mouth daily.   Yes [provider]  PREVNAR 20 0.5 ML injection Inject 0.5 mLs into the muscle once. 04/29/23  Yes [provider]  rosuvastatin (CRESTOR) 10 MG tablet TAKE 1 TABLET BY MOUTH EVERY DAY 07/22/23  Yes Flora Ratz, Eilleen Kempf, MD  tamsulosin (FLOMAX)  0.4 MG CAPS capsule TAKE 1 CAPSULE BY MOUTH DAILY AT NIGHT. 03/05/21  Yes Amanie Mcculley, Eilleen Kempf, MD  valsartan-hydrochlorothiazide (DIOVAN-HCT) 80-12.5 MG tablet TAKE 1 TABLET BY MOUTH EVERY DAY 08/06/23  Yes Anniemae Haberkorn, Eilleen Kempf, MD    Allergies  Allergen Reactions   Penicillins Hives and Rash    Has patient had a PCN reaction causing immediate rash, facial/tongue/throat swelling, SOB or lightheadedness with hypotension:unsure Has patient had a PCN reaction causing severe rash involving mucus membranes or skin necrosis:unsure Has patient had a PCN reaction that required hospitalization:No Has patient had a PCN reaction occurring within the last 10 years:No If all of the above answers are "NO", then may proceed with Cephalosporin use. Childhood reaction     Patient Active Problem List   Diagnosis Date Noted   Centrilobular emphysema (HCC) 06/04/2023   Interstitial lung disease (HCC) 06/04/2023   Chronic cough 02/03/2023   Overactive bladder 09/17/2022   Aortic atherosclerosis (HCC) 03/17/2022   Acute insomnia 10/23/2021   Chronic left-sided low back pain with left-sided sciatica 03/05/2021   Osteoarthritis of knee 01/31/2021   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   Scoliosis deformity of spine 04/22/2019   Generalized anxiety disorder    Panic disorder    Insomnia    Spinal stenosis of lumbar region 11/30/2018   GERD (gastroesophageal reflux disease) 08/14/2017   S/P left TKA 08/11/2016   S/P knee replacement 08/11/2016   OSA (obstructive sleep apnea)  03/22/2014   Snoring 05/09/2013   Essential hypertension 02/11/2013   Dyslipidemia 02/11/2013   Arthritis 02/11/2013    Past Medical History:  Diagnosis Date   Allergy    seasonal    Anxiety    Arthritis    Barrett's esophagus    Cataract    Chicken pox    Colon polyps    hyperplastic   Diverticulosis    GERD (gastroesophageal reflux disease)    Hemorrhoids    Hyperlipidemia    Hypertension     Insomnia    Leg cramps    Measles    Mumps    Obstructive sleep apnea    Pneumonia    Sleep apnea    dental device    Past Surgical History:  Procedure Laterality Date   APPENDECTOMY  1957   CATARACT EXTRACTION     COLONOSCOPY     JOINT REPLACEMENT     TONSILLECTOMY     TONSILLECTOMY AND ADENOIDECTOMY  1954   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   Procedure: LEFT TOTAL KNEE ARTHROPLASTY;  Surgeon: Durene Romans, MD;  Location: [facility];  Service: Orthopedics;  Laterality: Left;   UPPER GASTROINTESTINAL ENDOSCOPY      Social History   Socioeconomic History   Marital status: Widowed    Spouse name: [name]   Number of children: 1   Years of education: PHD   Highest education level: Doctorate  Occupational History   Occupation: Retired    Comment: Loss adjuster, chartered: [employer]  Tobacco Use   Smoking status: Former    Current packs/day: 0.00    Average packs/day: 1 pack/day for 40.0 years (40.0 ttl pk-yrs)    Types: Cigarettes    Start date: 01/19/1967    Quit date: 01/19/2007    Years since quitting: 16.6   Smokeless tobacco: Never  Vaping Use   Vaping status: Never Used  Substance and Sexual Activity   Alcohol use: Yes    Alcohol/week: 1.0 standard drink of alcohol    Types: 1 Cans of beer per week   Drug use: No   Sexual activity: Not on file  Other Topics Concern   Not on file  Social History Narrative   Patient lives alone in his home   Patient is retired   Patient has a Ph.D   Patient has one adult child.   Patient is right-handed.   Patient drinks 1 cup of tea and 1 cup of coffee daily    Social Drivers of Health   Financial Resource Strain: Low Risk  (09/20/2023)   Overall Financial Resource Strain (CARDIA)    Difficulty of Paying Living Expenses: Not hard at all  Food Insecurity: No Food Insecurity (09/20/2023)   Hunger Vital Sign    Worried About Running Out of Food in the Last Year: Never true    Ran Out of Food in  the Last Year: Never true  Transportation Needs: No Transportation Needs (09/20/2023)   PRAPARE - Administrator, Civil Service (Medical): No    Lack of Transportation (Non-Medical): No  Physical Activity: Sufficiently Active (09/20/2023)   Exercise Vital Sign    Days of Exercise per Week: 4 days    Minutes of Exercise per Session: 50 min  Stress: No Stress Concern Present (09/20/2023)   [employer] of Occupational Health - Occupational Stress Questionnaire    Feeling of Stress : Only a little  Social Connections: Moderately Integrated (09/20/2023)  Social Advertising account executive [NHANES]    Frequency of Communication with Friends and Family: More than three times a week    Frequency of Social Gatherings with Friends and Family: More than three times a week    Attends Religious Services: More than 4 times per year    Active Member of [employer] or Organizations: Yes    Attends Banker Meetings: More than 4 times per year    Marital Status: Widowed  Intimate Partner Violence: Not At Risk (04/09/2023)   Humiliation, Afraid, Rape, and Kick questionnaire    Fear of Current or Ex-Partner: No    Emotionally Abused: No    Physically Abused: No    Sexually Abused: No    Family History  Problem Relation Age of Onset   Alzheimer's disease Mother    Cancer Father    Stomach cancer Father    Aortic aneurysm Son    Heart disease Son    Cancer Maternal Grandmother    Heart disease Maternal Grandfather    Stomach cancer Paternal Grandfather    Colon cancer Neg Hx    Colon polyps Neg Hx    Esophageal cancer Neg Hx    Rectal cancer Neg Hx    Pancreatic cancer Neg Hx    Liver cancer Neg Hx      Review of Systems  Constitutional: Negative.  Negative for chills and fever.  HENT: Negative.  Negative for congestion and sore throat.   Respiratory: Negative.  Negative for cough and shortness of breath.   Cardiovascular: Negative.  Negative for chest pain and  palpitations.  Gastrointestinal:  Negative for abdominal pain, diarrhea, nausea and vomiting.  Genitourinary: Negative.  Negative for dysuria and hematuria.  Skin: Negative.  Negative for rash.  Neurological:  Negative for dizziness and headaches.  All other systems reviewed and are negative.   Vitals:   09/21/23 0948  BP: 128/74  Pulse: 72  Temp: 98.4 F (36.9 C)  SpO2: 95%    Physical Exam Vitals reviewed.  Constitutional:      Appearance: Normal appearance.  HENT:     Head: Normocephalic.     Mouth/Throat:     Mouth: Mucous membranes are moist.     Pharynx: Oropharynx is clear.  Eyes:     Extraocular Movements: Extraocular movements intact.     Pupils: Pupils are equal, round, and reactive to light.  Cardiovascular:     Rate and Rhythm: Normal rate and regular rhythm.     Pulses: Normal pulses.     Heart sounds: Normal heart sounds.  Pulmonary:     Effort: Pulmonary effort is normal.     Breath sounds: Normal breath sounds.  Musculoskeletal:     Cervical back: No tenderness.     Comments: Right knee: No tenderness or significant swelling.  Full range of motion.  No crepitation.  Lymphadenopathy:     Cervical: No cervical adenopathy.  Skin:    General: Skin is warm and dry.  Neurological:     Mental Status: He is alert and oriented to person, place, and time.  Psychiatric:        Mood and Affect: Mood normal.        Behavior: Behavior normal.      ASSESSMENT & PLAN: A total of 44 minutes was spent with the patient and counseling/coordination of care regarding preparing for this visit, review of most recent office visit notes, review of multiple chronic medical conditions and their management, review of  all medications, review of most recent bloodwork results, review of health maintenance items, education on nutrition, stress management, need for orthopedic evaluation of chronic right knee pain, prognosis, documentation, and need for follow up.   Problem List  Items Addressed This Visit       Cardiovascular and Mediastinum   Essential hypertension - Primary   Well-controlled hypertension Continue valsartan hydrochlorothiazide 80-12.5 mg daily Cardiovascular risks associated with hypertension discussed      Aortic atherosclerosis (HCC)   Chronic stable condition Diet and nutrition discussed Continue rosuvastatin 10 mg daily        Respiratory   OSA (obstructive sleep apnea)   Stable on CPAP treatment      Centrilobular emphysema (HCC)   Stable chronic condition Continues daily Trelegy      Interstitial lung disease (HCC)   Chronic stable condition Was evaluated by pulmonary        Digestive   GERD (gastroesophageal reflux disease)   Chronic stable condition Recently seen by GI and had upper endoscopy done Continues Prevacid 30 mg daily        Nervous and Auditory   Chronic left-sided low back pain with left-sided sciatica   Chronic stable condition.  No concerns.        Genitourinary   Overactive bladder   Stable Continue Gemtesa 75 mg daily and tamsulosin 0.4 mg daily        Other   Dyslipidemia   Diet and nutrition discussed Continues rosuvastatin 10 mg daily      Generalized anxiety disorder   Stable and off medication.  Sees therapist on a regular basis. However situational anxiety triggered by son's situation contributing      Chronic pain of right knee   Active and affecting quality of life Pain management discussed Recommend orthopedic evaluation Referral placed today.      Relevant Orders   Ambulatory referral to Orthopedic Surgery   Patient Instructions  Health Maintenance After Age 74 After age 81, you are at a higher risk for certain long-term diseases and infections as well as injuries from falls. Falls are a major cause of broken bones and head injuries in people who are older than age 56. Getting regular preventive care can help to keep you healthy and well. Preventive care includes  getting regular testing and making lifestyle changes as recommended by your health care provider. Talk with your health care provider about: Which screenings and tests you should have. A screening is a test that checks for a disease when you have no symptoms. A diet and exercise plan that is right for you. What should I know about screenings and tests to prevent falls? Screening and testing are the best ways to find a health problem early. Early diagnosis and treatment give you the best chance of managing medical conditions that are common after age 30. Certain conditions and lifestyle choices may make you more likely to have a fall. Your health care provider may recommend: Regular vision checks. Poor vision and conditions such as cataracts can make you more likely to have a fall. If you wear glasses, make sure to get your prescription updated if your vision changes. Medicine review. Work with your health care provider to regularly review all of the medicines you are taking, including over-the-counter medicines. Ask your health care provider about any side effects that may make you more likely to have a fall. Tell your health care provider if any medicines that you take make you feel dizzy or  sleepy. Strength and balance checks. Your health care provider may recommend certain tests to check your strength and balance while standing, walking, or changing positions. Foot health exam. Foot pain and numbness, as well as not wearing proper footwear, can make you more likely to have a fall. Screenings, including: Osteoporosis screening. Osteoporosis is a condition that causes the bones to get weaker and break more easily. Blood pressure screening. Blood pressure changes and medicines to control blood pressure can make you feel dizzy. Depression screening. You may be more likely to have a fall if you have a fear of falling, feel depressed, or feel unable to do activities that you used to do. Alcohol use  screening. Using too much alcohol can affect your balance and may make you more likely to have a fall. Follow these instructions at home: Lifestyle Do not drink alcohol if: Your health care provider tells you not to drink. If you drink alcohol: Limit how much you have to: 0-1 drink a day for women. 0-2 drinks a day for men. Know how much alcohol is in your drink. In the [place] one drink equals one 12 oz bottle of beer (355 mL), one 5 oz glass of wine (148 mL), or one 1 oz glass of hard liquor (44 mL). Do not use any products that contain nicotine or tobacco. These products include cigarettes, chewing tobacco, and vaping devices, such as e-cigarettes. If you need help quitting, ask your health care provider. Activity  Follow a regular exercise program to stay fit. This will help you maintain your balance. Ask your health care provider what types of exercise are appropriate for you. If you need a cane or walker, use it as recommended by your health care provider. Wear supportive shoes that have nonskid soles. Safety  Remove any tripping hazards, such as rugs, cords, and clutter. Install safety equipment such as grab bars in bathrooms and safety rails on stairs. Keep rooms and walkways well-lit. General instructions Talk with your health care provider about your risks for falling. Tell your health care provider if: You fall. Be sure to tell your health care provider about all falls, even ones that seem minor. You feel dizzy, tiredness (fatigue), or off-balance. Take over-the-counter and prescription medicines only as told by your health care provider. These include supplements. Eat a healthy diet and maintain a healthy weight. A healthy diet includes low-fat dairy products, low-fat (lean) meats, and fiber from whole grains, beans, and lots of fruits and vegetables. Stay current with your vaccines. Schedule regular health, dental, and eye exams. Summary Having a healthy lifestyle and  getting preventive care can help to protect your health and wellness after age 20. Screening and testing are the best way to find a health problem early and help you avoid having a fall. Early diagnosis and treatment give you the best chance for managing medical conditions that are more common for people who are older than age 40. Falls are a major cause of broken bones and head injuries in people who are older than age 9. Take precautions to prevent a fall at home. Work with your health care provider to learn what changes you can make to improve your health and wellness and to prevent falls. This information is not intended to replace advice given to you by your health care provider. Make sure you discuss any questions you have with your health care provider. Document Revised: 01/07/2021 Document Reviewed: 01/07/2021 Elsevier Patient Education  2024 [employer].  Edwina Barth, MD [facility]

## 2023-09-21 NOTE — Assessment & Plan Note (Signed)
Active and affecting quality of life Pain management discussed Recommend orthopedic evaluation Referral placed today.

## 2023-09-21 NOTE — Assessment & Plan Note (Signed)
Well-controlled hypertension Continue valsartan hydrochlorothiazide 80-12.5 mg daily Cardiovascular risks associated with hypertension discussed

## 2023-09-25 ENCOUNTER — Ambulatory Visit: Payer: Medicare PPO | Admitting: Family

## 2023-09-25 DIAGNOSIS — M1711 Unilateral primary osteoarthritis, right knee: Secondary | ICD-10-CM | POA: Diagnosis not present

## 2023-09-29 ENCOUNTER — Encounter: Payer: Self-pay | Admitting: Family

## 2023-09-29 DIAGNOSIS — F411 Generalized anxiety disorder: Secondary | ICD-10-CM | POA: Diagnosis not present

## 2023-09-29 DIAGNOSIS — G4700 Insomnia, unspecified: Secondary | ICD-10-CM | POA: Diagnosis not present

## 2023-09-29 NOTE — Progress Notes (Signed)
Office Visit Note   Patient: Gregg Smith           Date of Birth: Feb 16, 1946           MRN: [id number] Visit Date: 09/25/2023              Requested by: Georgina Quint, MD [address] [place],  [place] [address] PCP: Georgina Quint, MD  Chief Complaint  Patient presents with   Right Knee - Pain      HPI: The patient is a 78 year old gentleman who presents today for initial evaluation of chronic right knee pain.  He did have a Depo-Medrol injection of the right knee in 2022 which provided him with good relief.  He states that his current pain has been gradually increasing over recent years he finds that his knee gives way and locks up on him.  He has not had any recent injuries  Reports that he did have an ACL injury decades ago  Today he would is not interested in cortisone injection but would like to discuss long-term options  He has had total knee arthroplasty on the left in the past  Assessment & Plan: Visit Diagnoses: No diagnosis found.  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  Have discussed risks and benefits.  Follow-Up Instructions: No follow-ups on file.   Right Knee Exam   Muscle Strength  The patient has normal right knee strength.  Tenderness  The patient is experiencing tenderness in the medial joint line and lateral joint line.  Range of Motion  The patient has normal right knee ROM.  Tests  Varus: negative Valgus: negative  Other  Swelling: mild Effusion: no effusion present      Patient is alert, oriented, no adenopathy, well-dressed, normal affect, normal respiratory effort.  Radiographs of the right knee from 2022 were reviewed today.  He has significant degenerative changes with bone-on-bone contact and osteophytic spurring. Imaging: No results found. No images are attached to  the encounter.  Labs: Lab Results  Component Value Date   HGBA1C 5.5 03/18/2023   HGBA1C 5.5 09/17/2022   HGBA1C 5.3 04/11/2019   LABURIC 6.6 06/12/2023   GRAMSTAIN Rare 08/14/2012   GRAMSTAIN WBC present-both PMN and Mononuclear 08/14/2012   GRAMSTAIN No Squamous Epithelial Cells Seen 08/14/2012   GRAMSTAIN Few Gram Negative Rods 08/14/2012   GRAMSTAIN Few Gram Positive Cocci In Pairs In Chains 08/14/2012   LABORGA Multiple Organisms Present,None Predominant 08/14/2012     Lab Results  Component Value Date   ALBUMIN 3.8 03/18/2023   ALBUMIN 3.9 08/13/2022   ALBUMIN 3.8 09/05/2021    Lab Results  Component Value Date   MG 2.0 08/13/2022   No results found for: "VD25OH"  No results found for: "PREALBUMIN"    Latest Ref Rng & Units 03/18/2023   10:04 AM 09/17/2022   10:16 AM 09/05/2021    9:50 AM  CBC EXTENDED  WBC 4.0 - 10.5 K/uL 5.4  7.6  6.7   RBC 4.22 - 5.81 Mil/uL 4.88  5.08  5.09   Hemoglobin 13.0 - 17.0 g/dL 32.4  40.1  02.7   HCT 39.0 - 52.0 % 41.7  42.6  43.4   Platelets 150.0 - 400.0 K/uL 191.0  191.0  185.0   NEUT# 1.4 - 7.7 K/uL 3.6  5.4  4.9   Lymph# 0.7 - 4.0 K/uL 0.9  0.9  0.8      There is no height or weight on file to calculate BMI.  Orders:  No orders of the defined types were placed in this encounter.  No orders of the defined types were placed in this encounter.    Procedures: No procedures performed  Clinical Data: No additional findings.  ROS:  All other systems negative, except as noted in the HPI. Review of Systems  Objective: Vital Signs: There were no vitals taken for this visit.  Specialty Comments:  No specialty comments available.  PMFS History: Patient Active Problem List   Diagnosis Date Noted   Chronic pain of right knee 09/21/2023   Centrilobular emphysema (HCC) 06/04/2023   Interstitial lung disease (HCC) 06/04/2023   Chronic cough 02/03/2023   Overactive bladder 09/17/2022   Aortic atherosclerosis (HCC)  03/17/2022   Acute insomnia 10/23/2021   Chronic left-sided low back pain with left-sided sciatica 03/05/2021   Osteoarthritis of knee 01/31/2021   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   Scoliosis deformity of spine 04/22/2019   Generalized anxiety disorder    Panic disorder    Insomnia    Spinal stenosis of lumbar region 11/30/2018   GERD (gastroesophageal reflux disease) 08/14/2017   S/P left TKA 08/11/2016   S/P knee replacement 08/11/2016   OSA (obstructive sleep apnea) 03/22/2014   Snoring 05/09/2013   Essential hypertension 02/11/2013   Dyslipidemia 02/11/2013   Arthritis 02/11/2013   Past Medical History:  Diagnosis Date   Allergy    seasonal    Anxiety    Arthritis    Barrett's esophagus    Cataract    Chicken pox    Colon polyps    hyperplastic   Diverticulosis    GERD (gastroesophageal reflux disease)    Hemorrhoids    Hyperlipidemia    Hypertension    Insomnia    Leg cramps    Measles    Mumps    Obstructive sleep apnea    Pneumonia    Sleep apnea    dental device    Family History  Problem Relation Age of Onset   Alzheimer's disease Mother    Cancer Father    Stomach cancer Father    Aortic aneurysm Son    Heart disease Son    Cancer Maternal Grandmother    Heart disease Maternal Grandfather    Stomach cancer Paternal Grandfather    Colon cancer Neg Hx    Colon polyps Neg Hx    Esophageal cancer Neg Hx    Rectal cancer Neg Hx    Pancreatic cancer Neg Hx    Liver cancer Neg Hx     Past Surgical History:  Procedure Laterality Date   APPENDECTOMY  1957   CATARACT EXTRACTION     COLONOSCOPY     JOINT REPLACEMENT     TONSILLECTOMY     TONSILLECTOMY AND ADENOIDECTOMY  1954   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   Procedure: LEFT TOTAL KNEE ARTHROPLASTY;  Surgeon: Durene Romans, MD;  Location: [facility];  Service: Orthopedics;  Laterality: Left;   UPPER GASTROINTESTINAL ENDOSCOPY     Social History   Occupational History    Occupation: Retired    Comment: Loss adjuster, 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

## 2023-10-26 DIAGNOSIS — N401 Enlarged prostate with lower urinary tract symptoms: Secondary | ICD-10-CM | POA: Diagnosis not present

## 2023-10-26 DIAGNOSIS — R351 Nocturia: Secondary | ICD-10-CM | POA: Diagnosis not present

## 2023-11-03 ENCOUNTER — Ambulatory Visit: Payer: Medicare PPO | Admitting: Emergency Medicine

## 2023-11-03 ENCOUNTER — Encounter: Payer: Self-pay | Admitting: Emergency Medicine

## 2023-11-03 VITALS — BP 136/84 | HR 68 | Temp 98.6°F | Ht 71.0 in | Wt 253.0 lb

## 2023-11-03 DIAGNOSIS — I10 Essential (primary) hypertension: Secondary | ICD-10-CM

## 2023-11-03 DIAGNOSIS — R29898 Other symptoms and signs involving the musculoskeletal system: Secondary | ICD-10-CM | POA: Insufficient documentation

## 2023-11-03 DIAGNOSIS — M4716 Other spondylosis with myelopathy, lumbar region: Secondary | ICD-10-CM | POA: Diagnosis not present

## 2023-11-03 DIAGNOSIS — M51362 Other intervertebral disc degeneration, lumbar region with discogenic back pain and lower extremity pain: Secondary | ICD-10-CM | POA: Diagnosis not present

## 2023-11-03 NOTE — Assessment & Plan Note (Signed)
 BP Readings from Last 3 Encounters:  11/03/23 136/84  09/21/23 128/74  08/17/23 134/78  Well-controlled hypertension Continue valsartan hydrochlorothiazide 80-12.5 mg daily Cardiovascular risks associated with hypertension discussed

## 2023-11-03 NOTE — Patient Instructions (Signed)
 Degenerative Disk Disease  Degenerative disk disease is a condition caused by changes that occur in the spinal disks as a person ages. Spinal disks are soft and compressible disks located between the bones of your spine (vertebrae). These disks act like shock absorbers. Degenerative disk disease can affect the whole spine. However, the neck and lower back are most often affected. Many changes can occur in the spinal disks with aging, such as: The spinal disks may dry and shrink. Small tears may occur in the tough, outer covering of the disk (annulus). The disk space may become smaller due to loss of water. Abnormal growths in the bone (spurs) may occur. This can put pressure on the nerve roots exiting the spinal canal, causing pain. The spinal canal may become narrowed. What are the causes? This condition may be caused by: Normal degeneration with age. Injuries. Certain activities and sports that cause damage. What increases the risk? The following factors may make you more likely to develop this condition: Being overweight. Having a family history of degenerative disk disease. Smoking and use of products that contain nicotine and tobacco. Sudden injury. Doing work that requires heavy lifting. What are the signs or symptoms? Symptoms of this condition include: Pain that varies in intensity. Some people have no pain, while others have severe pain. The location of the pain depends on the part of your backbone that is affected. You may have: Pain in your neck or arm if a disk in your neck area is affected. Pain in your back, buttocks, or legs if a disk in your lower back is affected. Pain that becomes worse while bending or reaching up, or with twisting movements. Pain that may start gradually and worsen as time passes. It may also start after a major or minor injury. Numbness or tingling in the arms or legs. How is this diagnosed? This condition may be diagnosed based on: Your symptoms  and medical history. A physical exam. Imaging tests, including: X-ray of the spine. CT scan. MRI. How is this treated? This condition may be treated with: Medicines. Injection of steroids into the back. Rehabilitation exercises. These activities aim to strengthen muscles in your back and abdomen to better support your spine. If treatments do not help to relieve your symptoms or you have severe pain, you may need surgery. Follow these instructions at home: Medicines Take over-the-counter and prescription medicines only as told by your health care provider. Ask your health care provider if the medicine prescribed to you: Requires you to avoid driving or using machinery. Can cause constipation. You may need to take these actions to prevent or treat constipation: Drink enough fluid to keep your urine pale yellow. Take over-the-counter or prescription medicines. Eat foods that are high in fiber, such as beans, whole grains, and fresh fruits and vegetables. Limit foods that are high in fat and processed sugars, such as fried or sweet foods. Activity Rest as told by your health care provider. Avoid sitting for a long time without moving. Get up to take short walks every 1-2 hours. This is important to improve blood flow and breathing. Ask for help if you feel weak or unsteady. Return to your normal activities as told by your health care provider. Ask your health care provider what activities are safe for you. Perform relaxation exercises as told by your health care provider. Maintain good posture. Do not lift anything that is heavier than 10 lb (4.5 kg), or the limit that you are told, until your health  care provider says that it is safe. Follow proper lifting and walking techniques as told by your health care provider. Managing pain, stiffness, and swelling     If directed, put ice on the painful area. Icing can help to relieve pain. To do this: Put ice in a plastic bag. Place a towel  between your skin and the bag. Leave the ice on for 20 minutes, 2-3 times a day. Remove the ice if your skin turns bright red. This is very important. If you cannot feel pain, heat, or cold, you have a greater risk of damage to the area. If directed, apply heat to the painful area as often as told by your health care provider. Heat can reduce the stiffness of your muscles. Use the heat source that your health care provider recommends, such as a moist heat pack or a heating pad. Place a towel between your skin and the heat source. Leave the heat on for 20-30 minutes. Remove the heat if your skin turns bright red. This is especially important if you are unable to feel pain, heat, or cold. You may have a greater risk of getting burned. General instructions Change your sitting, standing, and sleeping habits as told by your health care provider. Avoid sitting in the same position for long periods of time. Change positions frequently. Lose weight or maintain a healthy weight as told by your health care provider. Do not use any products that contain nicotine or tobacco, such as cigarettes, e-cigarettes, and chewing tobacco. If you need help quitting, ask your health care provider. Wear supportive footwear. Keep all follow-up visits. This is important. This may include visits for physical therapy. Contact a health care provider if you: Have pain that does not go away within 1-4 weeks. Lose your appetite. Lose weight without trying. Get help right away if you: Have severe pain. Notice weakness in your arms, hands, or legs. Begin to lose control of your bladder or bowel movements. Have fevers or night sweats. Summary Degenerative disk disease is a condition caused by changes that occur in the spinal disks as a person ages. This condition can affect the whole spine. However, the neck and lower back are most often affected. Take over-the-counter and prescription medicines only as told by your health  care provider. This information is not intended to replace advice given to you by your health care provider. Make sure you discuss any questions you have with your health care provider. Document Revised: 12/01/2019 Document Reviewed: 12/01/2019 Elsevier Patient Education  2024 [employer].

## 2023-11-03 NOTE — Assessment & Plan Note (Signed)
 As noted on lumbar spine MRI from 2020 Creating significant symptoms today Needs orthopedic evaluation and repeat lumbar spine MRI

## 2023-11-03 NOTE — Progress Notes (Signed)
 Gregg Smith 78 y.o.   Chief Complaint  Patient presents with   Extremity Weakness    Patient states he has been having left leg weakness that started back up about 2 weeks ago. Patient did see podiatrist in the fall and told him he had plantar fascitis in the same leg and was given some foot exercises. What was concerning him is the strength in his left leg notice it more when going up stairs. He is having numbness and tightness he states it feels like he has a tight sock on     HISTORY OF PRESENT ILLNESS: This is a 78 y.o. 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  Report reviewed with patient Weakness more noticeable when he goes up the stairs. Denies bladder or bowel symptoms.  Extremity Weakness  Pertinent negatives include no fever.     Prior to Admission medications   Medication Sig Start Date End Date Taking? Authorizing Provider  BOOSTRIX 5-2.5-18.5 LF-MCG/0.5 injection Inject 0.5 mLs into the muscle once. 04/10/23  Yes [provider]  CLINPRO 5000 1.1 % PSTE Place onto teeth daily. 03/23/20  Yes [provider]  eszopiclone (LUNESTA) 1 MG TABS tablet Take 1 mg by mouth every evening. 11/12/22  Yes [provider]  Fluticasone-Umeclidin-Vilant (TRELEGY ELLIPTA) 100-62.5-25 MCG/ACT AEPB Inhale 1 puff into the lungs daily. 06/04/23  Yes Makaleigh Reinard, Eilleen Kempf, MD  GEMTESA 75 MG TABS TAKE 75 MG BY MOUTH DAILY. 06/12/23  Yes Ashelyn Mccravy, Eilleen Kempf, MD  L-Theanine 200 MG CAPS Take 200 mg by mouth daily.   Yes [provider]  lansoprazole (PREVACID) 30 MG capsule TAKE 1 CAPSULE (30 MG TOTAL) BY MOUTH 2 (TWO) TIMES DAILY BEFORE A MEAL. 06/12/23  Yes Hilarie Fredrickson, MD  Milk Thistle 1000 MG CAPS Take 1 capsule by mouth daily.   Yes [provider]  Multiple Vitamin (MULTIVITAMIN) tablet Take 1 tablet by  mouth daily.   Yes [provider]  PREVNAR 20 0.5 ML injection Inject 0.5 mLs into the muscle once. 04/29/23  Yes [provider]  rosuvastatin (CRESTOR) 10 MG tablet TAKE 1 TABLET BY MOUTH EVERY DAY 07/22/23  Yes Zawadi Aplin, Eilleen Kempf, MD  tamsulosin (FLOMAX) 0.4 MG CAPS capsule TAKE 1 CAPSULE BY MOUTH DAILY AT NIGHT. 03/05/21  Yes Desirae Mancusi, Eilleen Kempf, MD  valsartan-hydrochlorothiazide (DIOVAN-HCT) 80-12.5 MG tablet TAKE 1 TABLET BY MOUTH EVERY DAY 08/06/23  Yes Jaeven Wanzer, Eilleen Kempf, MD    Allergies  Allergen Reactions   Penicillins Hives and Rash    Has patient had a PCN reaction causing immediate rash, facial/tongue/throat swelling, SOB or lightheadedness with hypotension:unsure Has patient had a PCN reaction causing severe rash involving mucus membranes or skin necrosis:unsure Has patient had a PCN reaction that required hospitalization:No Has patient had a PCN reaction occurring within the last 10 years:No If all of the above answers are "NO", then may proceed with Cephalosporin use. Childhood reaction     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   Chronic pain of right knee 09/21/2023   Centrilobular emphysema (HCC) 06/04/2023   Interstitial lung disease (HCC) 06/04/2023   Chronic cough 02/03/2023   Overactive bladder 09/17/2022   Aortic atherosclerosis (HCC) 03/17/2022   Acute insomnia 10/23/2021   Chronic left-sided low  back pain with left-sided sciatica 03/05/2021   Osteoarthritis of knee 01/31/2021   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   Scoliosis deformity of spine 04/22/2019   Generalized anxiety disorder    Panic disorder    Insomnia    Spinal stenosis of lumbar region 11/30/2018   GERD (gastroesophageal reflux disease) 08/14/2017   S/P left TKA 08/11/2016   S/P knee replacement 08/11/2016   OSA (obstructive sleep apnea)  03/22/2014   Snoring 05/09/2013   Essential hypertension 02/11/2013   Dyslipidemia 02/11/2013   Arthritis 02/11/2013    Past Medical History:  Diagnosis Date   Allergy    seasonal    Anxiety    Arthritis    Barrett's esophagus    Cataract    Chicken pox    Colon polyps    hyperplastic   Diverticulosis    GERD (gastroesophageal reflux disease)    Hemorrhoids    Hyperlipidemia    Hypertension    Insomnia    Leg cramps    Measles    Mumps    Obstructive sleep apnea    Pneumonia    Sleep apnea    dental device    Past Surgical History:  Procedure Laterality Date   APPENDECTOMY  1957   CATARACT EXTRACTION     COLONOSCOPY     JOINT REPLACEMENT     TONSILLECTOMY     TONSILLECTOMY AND ADENOIDECTOMY  1954   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   Procedure: LEFT TOTAL KNEE ARTHROPLASTY;  Surgeon: Durene Romans, MD;  Location: [facility];  Service: Orthopedics;  Laterality: Left;   UPPER GASTROINTESTINAL ENDOSCOPY      Social History   Socioeconomic History   Marital status: Widowed    Spouse name: [name]   Number of children: 1   Years of education: PHD   Highest education level: Doctorate  Occupational History   Occupation: Retired    Comment: Loss adjuster, chartered: [employer]  Tobacco Use   Smoking status: Former    Current packs/day: 0.00    Average packs/day: 1 pack/day for 40.0 years (40.0 ttl pk-yrs)    Types: Cigarettes    Start date: 01/19/1967    Quit date: 01/19/2007    Years since quitting: 16.8   Smokeless tobacco: Never  Vaping Use   Vaping status: Never Used  Substance and Sexual Activity   Alcohol use: Yes    Alcohol/week: 1.0 standard drink of alcohol    Types: 1 Cans of beer per week   Drug use: No   Sexual activity: Not on file  Other Topics Concern   Not on file  Social History Narrative   Patient lives alone in his home   Patient is retired   Patient has a Ph.D   Patient has one adult child.   Patient  is right-handed.   Patient drinks 1 cup of tea and 1 cup of coffee daily    Social Drivers of Health   Financial Resource Strain: Low Risk  (09/20/2023)   Overall Financial Resource Strain (CARDIA)    Difficulty of Paying Living Expenses: Not hard at all  Food Insecurity: No Food Insecurity (09/20/2023)   Hunger Vital Sign    Worried About Running Out of Food in the Last Year: Never true    Ran Out of Food in the Last Year: Never true  Transportation Needs: No Transportation Needs (09/20/2023)   PRAPARE - Administrator, Civil Service (  Medical): No    Lack of Transportation (Non-Medical): No  Physical Activity: Sufficiently Active (09/20/2023)   Exercise Vital Sign    Days of Exercise per Week: 4 days    Minutes of Exercise per Session: 50 min  Stress: No Stress Concern Present (09/20/2023)   [employer] of Occupational Health - Occupational Stress Questionnaire    Feeling of Stress : Only a little  Social Connections: Moderately Integrated (09/20/2023)   Social Connection and Isolation Panel [NHANES]    Frequency of Communication with Friends and Family: More than three times a week    Frequency of Social Gatherings with Friends and Family: More than three times a week    Attends Religious Services: More than 4 times per year    Active Member of [employer] or Organizations: Yes    Attends Banker Meetings: More than 4 times per year    Marital Status: Widowed  Intimate Partner Violence: Not At Risk (04/09/2023)   Humiliation, Afraid, Rape, and Kick questionnaire    Fear of Current or Ex-Partner: No    Emotionally Abused: No    Physically Abused: No    Sexually Abused: No    Family History  Problem Relation Age of Onset   Alzheimer's disease Mother    Cancer Father    Stomach cancer Father    Aortic aneurysm Son    Heart disease Son    Cancer Maternal Grandmother    Heart disease Maternal Grandfather    Stomach cancer Paternal Grandfather    Colon  cancer Neg Hx    Colon polyps Neg Hx    Esophageal cancer Neg Hx    Rectal cancer Neg Hx    Pancreatic cancer Neg Hx    Liver cancer Neg Hx      Review of Systems  Constitutional: Negative.  Negative for chills and fever.  HENT: Negative.  Negative for congestion and sore throat.   Respiratory: Negative.  Negative for cough and shortness of breath.   Cardiovascular: Negative.  Negative for chest pain and palpitations.  Gastrointestinal:  Negative for abdominal pain, blood in stool, constipation, diarrhea, nausea and vomiting.  Genitourinary: Negative.  Negative for dysuria and hematuria.  Musculoskeletal:  Positive for extremity weakness.  Skin: Negative.  Negative for rash.  Neurological:  Positive for sensory change and focal weakness. Negative for dizziness and headaches.  All other systems reviewed and are negative.   Vitals:   11/03/23 1432  BP: 136/84  Pulse: 68  Temp: 98.6 F (37 C)  SpO2: 94%    Physical Exam Vitals reviewed.  Constitutional:      Appearance: Normal appearance.  HENT:     Head: Normocephalic.  Eyes:     Extraocular Movements: Extraocular movements intact.  Cardiovascular:     Rate and Rhythm: Normal rate.  Pulmonary:     Effort: Pulmonary effort is normal.  Abdominal:     Palpations: Abdomen is soft.     Tenderness: There is no abdominal tenderness.  Skin:    General: Skin is warm and dry.  Neurological:     Mental Status: He is alert and oriented to person, place, and time.     Sensory: No sensory deficit.     Motor: Weakness (Mild weakness to left leg 3-4/5) present.     Deep Tendon Reflexes: Reflexes normal.  Psychiatric:        Mood and Affect: Mood normal.        Behavior: Behavior normal.  ASSESSMENT & PLAN: A total of 42 minutes was spent with the patient and counseling/coordination of care regarding preparing for this visit, review of most recent office visit notes, review of multiple chronic medical conditions and  their management, finding of left lower extremity weakness and differential diagnosis, need for orthopedic evaluation, need for lumbar spine MRI, review of all medications, review of most recent bloodwork results, review of health maintenance items, education on nutrition, prognosis, documentation, and need for follow up.   Problem List Items Addressed This Visit       Cardiovascular and Mediastinum   Essential hypertension   BP Readings from Last 3 Encounters:  11/03/23 136/84  09/21/23 128/74  08/17/23 134/78  Well-controlled hypertension Continue valsartan hydrochlorothiazide 80-12.5 mg daily Cardiovascular risks associated with hypertension discussed         Nervous and Auditory   Left leg weakness - Primary   Mild weakness to left lower extremity.  No recent falls. Likely secondary to spinal stenosis and degenerative disc disease of lumbar spine Needs orthopedic evaluation Need to repeat lumbar spine MRI      Relevant Orders   Ambulatory referral to Orthopedic Surgery   MR Lumbar Spine Wo Contrast   Osteoarthritis of lumbar spine with myelopathy   Active, affecting quality of life, and contributing to left leg weakness putting him at risk for falls. Recommend to repeat lumbar spine MRI Needs orthopedic evaluation Referral placed today      Relevant Orders   Ambulatory referral to Orthopedic Surgery   MR Lumbar Spine Wo Contrast     Musculoskeletal and Integument   Degeneration of lumbar intervertebral disc   As noted on lumbar spine MRI from 2020 Creating significant symptoms today Needs orthopedic evaluation and repeat lumbar spine MRI      Relevant Orders   Ambulatory referral to Orthopedic Surgery   MR Lumbar Spine Wo Contrast   Patient Instructions  Degenerative Disk Disease  Degenerative disk disease is a condition caused by changes that occur in the spinal disks as a person ages. Spinal disks are soft and compressible disks located between the bones  of your spine (vertebrae). These disks act like shock absorbers. Degenerative disk disease can affect the whole spine. However, the neck and lower back are most often affected. Many changes can occur in the spinal disks with aging, such as: The spinal disks may dry and shrink. Small tears may occur in the tough, outer covering of the disk (annulus). The disk space may become smaller due to loss of water. Abnormal growths in the bone (spurs) may occur. This can put pressure on the nerve roots exiting the spinal canal, causing pain. The spinal canal may become narrowed. What are the causes? This condition may be caused by: Normal degeneration with age. Injuries. Certain activities and sports that cause damage. What increases the risk? The following factors may make you more likely to develop this condition: Being overweight. Having a family history of degenerative disk disease. Smoking and use of products that contain nicotine and tobacco. Sudden injury. Doing work that requires heavy lifting. What are the signs or symptoms? Symptoms of this condition include: Pain that varies in intensity. Some people have no pain, while others have severe pain. The location of the pain depends on the part of your backbone that is affected. You may have: Pain in your neck or arm if a disk in your neck area is affected. Pain in your back, buttocks, or legs if a disk in your  lower back is affected. Pain that becomes worse while bending or reaching up, or with twisting movements. Pain that may start gradually and worsen as time passes. It may also start after a major or minor injury. Numbness or tingling in the arms or legs. How is this diagnosed? This condition may be diagnosed based on: Your symptoms and medical history. A physical exam. Imaging tests, including: X-ray of the spine. CT scan. MRI. How is this treated? This condition may be treated with: Medicines. Injection of steroids into the  back. Rehabilitation exercises. These activities aim to strengthen muscles in your back and abdomen to better support your spine. If treatments do not help to relieve your symptoms or you have severe pain, you may need surgery. Follow these instructions at home: Medicines Take over-the-counter and prescription medicines only as told by your health care provider. Ask your health care provider if the medicine prescribed to you: Requires you to avoid driving or using machinery. Can cause constipation. You may need to take these actions to prevent or treat constipation: Drink enough fluid to keep your urine pale yellow. Take over-the-counter or prescription medicines. Eat foods that are high in fiber, such as beans, whole grains, and fresh fruits and vegetables. Limit foods that are high in fat and processed sugars, such as fried or sweet foods. Activity Rest as told by your health care provider. Avoid sitting for a long time without moving. Get up to take short walks every 1-2 hours. This is important to improve blood flow and breathing. Ask for help if you feel weak or unsteady. Return to your normal activities as told by your health care provider. Ask your health care provider what activities are safe for you. Perform relaxation exercises as told by your health care provider. Maintain good posture. Do not lift anything that is heavier than 10 lb (4.5 kg), or the limit that you are told, until your health care provider says that it is safe. Follow proper lifting and walking techniques as told by your health care provider. Managing pain, stiffness, and swelling     If directed, put ice on the painful area. Icing can help to relieve pain. To do this: Put ice in a plastic bag. Place a towel between your skin and the bag. Leave the ice on for 20 minutes, 2-3 times a day. Remove the ice if your skin turns bright red. This is very important. If you cannot feel pain, heat, or cold, you have a  greater risk of damage to the area. If directed, apply heat to the painful area as often as told by your health care provider. Heat can reduce the stiffness of your muscles. Use the heat source that your health care provider recommends, such as a moist heat pack or a heating pad. Place a towel between your skin and the heat source. Leave the heat on for 20-30 minutes. Remove the heat if your skin turns bright red. This is especially important if you are unable to feel pain, heat, or cold. You may have a greater risk of getting burned. General instructions Change your sitting, standing, and sleeping habits as told by your health care provider. Avoid sitting in the same position for long periods of time. Change positions frequently. Lose weight or maintain a healthy weight as told by your health care provider. Do not use any products that contain nicotine or tobacco, such as cigarettes, e-cigarettes, and chewing tobacco. If you need help quitting, ask your health care provider. Wear  supportive footwear. Keep all follow-up visits. This is important. This may include visits for physical therapy. Contact a health care provider if you: Have pain that does not go away within 1-4 weeks. Lose your appetite. Lose weight without trying. Get help right away if you: Have severe pain. Notice weakness in your arms, hands, or legs. Begin to lose control of your bladder or bowel movements. Have fevers or night sweats. Summary Degenerative disk disease is a condition caused by changes that occur in the spinal disks as a person ages. This condition can affect the whole spine. However, the neck and lower back are most often affected. Take over-the-counter and prescription medicines only as told by your health care provider. This information is not intended to replace advice given to you by your health care provider. Make sure you discuss any questions you have with your health care provider. Document Revised:  12/01/2019 Document Reviewed: 12/01/2019 Elsevier Patient Education  2024 [employer]     Edwina Barth, MD [facility]

## 2023-11-03 NOTE — Assessment & Plan Note (Signed)
 Mild weakness to left lower extremity.  No recent falls. Likely secondary to spinal stenosis and degenerative disc disease of lumbar spine Needs orthopedic evaluation Need to repeat lumbar spine MRI

## 2023-11-03 NOTE — Assessment & Plan Note (Signed)
 Active, affecting quality of life, and contributing to left leg weakness putting him at risk for falls. Recommend to repeat lumbar spine MRI Needs orthopedic evaluation Referral placed today

## 2023-11-11 ENCOUNTER — Ambulatory Visit: Admitting: Physical Medicine and Rehabilitation

## 2023-11-11 ENCOUNTER — Encounter: Payer: Self-pay | Admitting: Physical Medicine and Rehabilitation

## 2023-11-11 DIAGNOSIS — M48061 Spinal stenosis, lumbar region without neurogenic claudication: Secondary | ICD-10-CM | POA: Diagnosis not present

## 2023-11-11 DIAGNOSIS — R202 Paresthesia of skin: Secondary | ICD-10-CM

## 2023-11-11 DIAGNOSIS — G8929 Other chronic pain: Secondary | ICD-10-CM | POA: Diagnosis not present

## 2023-11-11 DIAGNOSIS — M5442 Lumbago with sciatica, left side: Secondary | ICD-10-CM | POA: Diagnosis not present

## 2023-11-11 DIAGNOSIS — M5416 Radiculopathy, lumbar region: Secondary | ICD-10-CM

## 2023-11-11 NOTE — Progress Notes (Signed)
 Gregg Smith - 78 y.o. male MRN [id number]  Date of birth: 03-21-1946  Office Visit Note: Visit Date: 11/11/2023 PCP: Georgina Quint, MD Referred by: Georgina Quint, *  Subjective: Chief Complaint  Patient presents with   Lower Back - Weakness   HPI: Gregg Smith is a 78 y.o. male who comes in today per the request of Dr. Edwina Barth for evaluation of chronic, worsening and severe bilateral lower back pain, also reports pain to left knee radiating down the leg to foot. Numbness/tingling to left lower leg. Feeling of having tight stocking on his leg. His pain worsens with prolonged sitting. States he feels stiff after waking up in the morning. Prolonged standing and walking does not seem to increase his discomfort. He describes pain as sore, aching and pins/needles sensation, currently rates as 2 out of 10. Some relief of pain with home exercise regimen, rest and use of medications. He has tried Gabapentin in the past with good relief of pain. History of chiropractic/physical therapy treatments with minimal relief of pain. Lumbar MRI imaging from 2020 shows left posterolateral extrusion and smaller central extrusion contributing to moderate severe central stenosis at L3-L4. He did undergo multiple lumbar epidural steroid injections with Dr. Sheran Luz at [facility] several years ago, no relief of pain with these injections.      Review of Systems  Musculoskeletal:  Positive for back pain.  Neurological:  Positive for tingling. Negative for focal weakness and weakness.  All other systems reviewed and are negative.  Otherwise per HPI.  Assessment & Plan: Visit Diagnoses:    ICD-10-CM   1. Chronic bilateral low back pain with left-sided sciatica  M54.42    G89.29     2. Lumbar radiculopathy  M54.16     3. Spinal stenosis of lumbar region without neurogenic claudication  M48.061     4. Paresthesia of skin  R20.2        Plan: Findings:  Chronic,  worsening and severe bilateral lower back pain, also reports pain to left knee radiating down the leg to foot. Patient continues to have severe pain/paresthesias despite good conservaitive therapies such as chiropractic/physical therapy treatments, home exercise regimen, rest and use of medications. Patients clinical presentation and exam are complex. His symptoms do not fit with classic neurogenic claudication, prior disc herniation at L3-L4 is likely re-absorbed. 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 He has good strength to bilateral lower extremities today, no myelopathic symptoms noted. Dr. Alvy Bimler placed order for lumbar MRI imaging, patient plans on calling to schedule imaging today. I asked him to please let me know when he is having imaging so I can pull his report. I will see him back for lumbar MRI review and to discuss options. Depending on results of MRI imaging we discussed possibility of performing lumbar epidural steroid injection. Would also consider referral to neurology for management of left leg paresthesias if MRI imaging looks fairly good. I did briefly discuss medication management with him today, he prefers not to take Gabapentin or Lyrica at this time. No red flag symptoms noted upon exam today.     Meds & Orders: No orders of the defined types were placed in this encounter.  No orders of the defined types were placed in this encounter.   Follow-up: Return for Lumbar MRI review.   Procedures: No procedures performed      Clinical History: No specialty comments available.   He reports that he  quit smoking about 16 years ago. His smoking use included cigarettes. He started smoking about 56 years ago. He has a 40 pack-year smoking history. He has never used smokeless tobacco.  Recent Labs    03/18/23 1004 06/12/23 1513  HGBA1C 5.5  --   LABURIC  --  6.6    Objective:  VS:  HT:    WT:   BMI:     BP:   HR: bpm  TEMP: ( )  RESP:   Physical Exam Vitals and nursing note reviewed.  HENT:     Head: Normocephalic and atraumatic.     Right Ear: External ear normal.     Left Ear: External ear normal.     Nose: Nose normal.     Mouth/Throat:     Mouth: Mucous membranes are moist.  Eyes:     Extraocular Movements: Extraocular movements intact.  Cardiovascular:     Rate and Rhythm: Normal rate.     Pulses: Normal pulses.  Pulmonary:     Effort: Pulmonary effort is normal.  Abdominal:     General: Abdomen is flat. There is no distension.  Musculoskeletal:        General: Tenderness present.     Cervical back: Normal range of motion.     Comments: Patient rises from seated position to standing without difficulty. Good lumbar range of motion. No pain noted with facet loading. 5/5 strength noted with bilateral hip flexion, knee flexion/extension, ankle dorsiflexion/plantarflexion and EHL. No clonus noted bilaterally. No pain upon palpation of greater trochanters. No pain with internal/external rotation of bilateral hips. Sensation intact bilaterally. Negative slump test bilaterally. Ambulates without aid, gait steady.     Skin:    General: Skin is warm and dry.     Capillary Refill: Capillary refill takes less than 2 seconds.  Neurological:     General: No focal deficit present.     Mental Status: He is alert and oriented to person, place, and time.  Psychiatric:        Mood and Affect: Mood normal.        Behavior: Behavior normal.     Ortho Exam  Imaging: No results found.  Past Medical/Family/Surgical/Social History: Medications & Allergies reviewed per EMR, new medications updated. 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   Chronic pain of right knee 09/21/2023   Centrilobular emphysema (HCC) 06/04/2023   Interstitial lung disease (HCC) 06/04/2023   Chronic cough 02/03/2023   Overactive bladder  09/17/2022   Aortic atherosclerosis (HCC) 03/17/2022   Acute insomnia 10/23/2021   Chronic left-sided low back pain with left-sided sciatica 03/05/2021   Osteoarthritis of knee 01/31/2021   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   Scoliosis deformity of spine 04/22/2019   Generalized anxiety disorder    Panic disorder    Insomnia    Spinal stenosis of lumbar region 11/30/2018   GERD (gastroesophageal reflux disease) 08/14/2017   S/P left TKA 08/11/2016   S/P knee replacement 08/11/2016   OSA (obstructive sleep apnea) 03/22/2014   Snoring 05/09/2013   Essential hypertension 02/11/2013   Dyslipidemia 02/11/2013   Arthritis 02/11/2013   Past Medical History:  Diagnosis Date   Allergy    seasonal    Anxiety    Arthritis    Barrett's esophagus    Cataract    Chicken pox    Colon polyps    hyperplastic  Diverticulosis    GERD (gastroesophageal reflux disease)    Hemorrhoids    Hyperlipidemia    Hypertension    Insomnia    Leg cramps    Measles    Mumps    Obstructive sleep apnea    Pneumonia    Sleep apnea    dental device   Family History  Problem Relation Age of Onset   Alzheimer's disease Mother    Cancer Father    Stomach cancer Father    Aortic aneurysm Son    Heart disease Son    Cancer Maternal Grandmother    Heart disease Maternal Grandfather    Stomach cancer Paternal Grandfather    Colon cancer Neg Hx    Colon polyps Neg Hx    Esophageal cancer Neg Hx    Rectal cancer Neg Hx    Pancreatic cancer Neg Hx    Liver cancer Neg Hx    Past Surgical History:  Procedure Laterality Date   APPENDECTOMY  1957   CATARACT EXTRACTION     COLONOSCOPY     JOINT REPLACEMENT     TONSILLECTOMY     TONSILLECTOMY AND ADENOIDECTOMY  1954   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   Procedure: LEFT TOTAL KNEE ARTHROPLASTY;  Surgeon: Durene Romans, MD;  Location: [facility];  Service: Orthopedics;  Laterality: Left;   UPPER GASTROINTESTINAL ENDOSCOPY      Social History   Occupational History   Occupation: Retired    Comment: Loss adjuster, chartered: [employer]  Tobacco Use   Smoking status: Former    Current packs/day: 0.00    Average packs/day: 1 pack/day for 40.0 years (40.0 ttl pk-yrs)    Types: Cigarettes    Start date: 01/19/1967    Quit date: 01/19/2007    Years since quitting: 16.8   Smokeless tobacco: Never  Vaping Use   Vaping status: Never Used  Substance and Sexual Activity   Alcohol use: Yes    Alcohol/week: 1.0 standard drink of alcohol    Types: 1 Cans of beer per week   Drug use: No   Sexual activity: Not on file

## 2023-11-11 NOTE — Progress Notes (Signed)
 Core Outcome Measures Index (COMI) Back Score  Average Pain 3  COMI Score 50%

## 2023-11-11 NOTE — Progress Notes (Signed)
 Pain Scale   Average Pain 0 Patient stats he has no pain just numbness and tingling in lower back to left leg        +Driver, -BT, -Dye Allergies.

## 2023-11-12 ENCOUNTER — Encounter: Payer: Self-pay | Admitting: Emergency Medicine

## 2023-11-22 ENCOUNTER — Ambulatory Visit
Admission: RE | Admit: 2023-11-22 | Discharge: 2023-11-22 | Disposition: A | Discharge status: Home / Self Care | Source: Ambulatory Visit | Attending: Emergency Medicine | Admitting: Emergency Medicine

## 2023-11-22 DIAGNOSIS — M48061 Spinal stenosis, lumbar region without neurogenic claudication: Secondary | ICD-10-CM | POA: Diagnosis not present

## 2023-11-22 DIAGNOSIS — M47816 Spondylosis without myelopathy or radiculopathy, lumbar region: Secondary | ICD-10-CM | POA: Diagnosis not present

## 2023-11-22 DIAGNOSIS — M5126 Other intervertebral disc displacement, lumbar region: Secondary | ICD-10-CM | POA: Diagnosis not present

## 2023-11-22 DIAGNOSIS — M4716 Other spondylosis with myelopathy, lumbar region: Secondary | ICD-10-CM

## 2023-11-22 DIAGNOSIS — M51362 Other intervertebral disc degeneration, lumbar region with discogenic back pain and lower extremity pain: Secondary | ICD-10-CM

## 2023-11-22 DIAGNOSIS — R29898 Other symptoms and signs involving the musculoskeletal system: Secondary | ICD-10-CM

## 2023-11-24 DIAGNOSIS — F411 Generalized anxiety disorder: Secondary | ICD-10-CM | POA: Diagnosis not present

## 2023-11-24 DIAGNOSIS — G4700 Insomnia, unspecified: Secondary | ICD-10-CM | POA: Diagnosis not present

## 2023-11-26 DIAGNOSIS — R3915 Urgency of urination: Secondary | ICD-10-CM | POA: Diagnosis not present

## 2023-12-07 ENCOUNTER — Encounter: Payer: Self-pay | Admitting: Pulmonary Disease

## 2023-12-07 ENCOUNTER — Ambulatory Visit: Payer: Medicare PPO | Admitting: Pulmonary Disease

## 2023-12-07 VITALS — BP 159/76 | HR 88 | Ht 71.0 in | Wt 250.0 lb

## 2023-12-07 DIAGNOSIS — G4733 Obstructive sleep apnea (adult) (pediatric): Secondary | ICD-10-CM

## 2023-12-07 DIAGNOSIS — J849 Interstitial pulmonary disease, unspecified: Secondary | ICD-10-CM | POA: Diagnosis not present

## 2023-12-07 DIAGNOSIS — J432 Centrilobular emphysema: Secondary | ICD-10-CM

## 2023-12-07 MED ORDER — ALBUTEROL SULFATE HFA 108 (90 BASE) MCG/ACT IN AERS: 2.0000 | INHALATION_SPRAY | Freq: Four times a day (QID) | RESPIRATORY_TRACT | 6 refills | Status: DC | PRN

## 2023-12-07 NOTE — Progress Notes (Signed)
 Synopsis: Referred in April 2024 for Emphysema  Subjective:   PATIENT ID: Gregg Smith GENDER: male DOB: 02/26/1946, MRN: [id number]  HPI  Chief Complaint  Patient presents with   Consult    Pt states he following up on his xray    Ho Parisi is a 78 year old male, former smoker with history of GERD, hypertension, seasonal allergies and OSA who is referred to pulmonary clinic for emphysema.  He reports some exertional dyspnea and wheezing with activities such as running and sometimes while gardening. He notes difficulty with running and jumping jacks, attributing it to joint discomfort rather than endurance issues. This has been ongoing for a long time, but he has only recently engaged in more regimented exercise.  He has a productive cough approximately twice a day, with phlegm production, and frequently needs to clear his throat, especially before singing or speaking after a period of silence.  He quit smoking in 2000 after smoking two packs a day since the age of 66. 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  He has a history of sleep apnea, for which he was initially given a mouthpiece instead of a CPAP machine. After losing 35 pounds through a lifestyle program, his son noted a reduction in snoring, suggesting an improvement in his sleep apnea. He no longer uses the mouthpiece due to dental work and has not noticed significant issues since the weight loss.  He has a history of insomnia, which began in early 2020, coinciding with an episode of sciatica that resolved after several months. During this period, he required hospitalization for insomnia management and was placed on a medication regimen that has since been reduced. He currently uses Lunesta to aid sleep, as he finds it difficult to sleep without medication.  Past Medical  History:  Diagnosis Date   Allergy    seasonal    Anxiety    Arthritis    Barrett's esophagus    Cataract    Chicken pox    Colon polyps    hyperplastic   Diverticulosis    GERD (gastroesophageal reflux disease)    Hemorrhoids    Hyperlipidemia    Hypertension    Insomnia    Leg cramps    Measles    Mumps    Obstructive sleep apnea    Pneumonia    Sleep apnea    dental device     Family History  Problem Relation Age of Onset   Alzheimer's disease Mother    Cancer Father    Stomach cancer Father    Aortic aneurysm Son    Heart disease Son    Cancer Maternal Grandmother    Heart disease Maternal Grandfather    Stomach cancer Paternal Grandfather    Colon cancer Neg Hx    Colon polyps Neg Hx    Esophageal cancer Neg Hx    Rectal cancer Neg Hx    Pancreatic cancer Neg Hx    Liver cancer Neg Hx      Social History   Socioeconomic History   Marital status: Widowed    Spouse name: [name]   Number of children: 1   Years of education: PHD   Highest education level: Doctorate  Occupational History   Occupation: Retired    Comment: Loss adjuster, chartered: [employer]  Tobacco Use   Smoking status: Former    Current packs/day: 0.00    Average packs/day: 1 pack/day for 40.0 years (40.0 ttl pk-yrs)    Types: Cigarettes    Start date: 01/19/1967    Quit date: 01/19/2007    Years since quitting: 16.8   Smokeless tobacco: Never  Vaping Use   Vaping status: Never Used  Substance and Sexual Activity   Alcohol use: Yes    Alcohol/week: 1.0 standard drink of alcohol    Types: 1 Cans of beer per week   Drug use: No   Sexual activity: Not on file  Other Topics Concern   Not on file  Social History Narrative   Patient lives alone in his home   Patient is retired   Patient has a Ph.D   Patient has one adult child.   Patient is right-handed.   Patient drinks 1 cup of tea and 1 cup of coffee daily    Social Drivers of Health   Financial  Resource Strain: Low Risk  (09/20/2023)   Overall Financial Resource Strain (CARDIA)    Difficulty of Paying Living Expenses: Not hard at all  Food Insecurity: No Food Insecurity (09/20/2023)   Hunger Vital Sign    Worried About Running Out of Food in the Last Year: Never true    Ran Out of Food in the Last Year: Never true  Transportation Needs: No Transportation Needs (09/20/2023)   PRAPARE - Administrator, Civil Service (Medical): No    Lack of Transportation (Non-Medical): No  Physical Activity: Sufficiently Active (09/20/2023)   Exercise Vital Sign    Days of Exercise per Week: 4 days    Minutes of Exercise per Session: 50 min  Stress: No Stress Concern Present (09/20/2023)   [employer] of Occupational Health - Occupational Stress Questionnaire    Feeling of Stress : Only a little  Social Connections: Moderately Integrated (09/20/2023)   Social Connection and Isolation Panel [NHANES]    Frequency of Communication with Friends and Family: More than three times a week    Frequency of Social Gatherings with Friends and Family: More than three times a week    Attends Religious Services: More than 4 times per year    Active Member of [employer] or Organizations: Yes    Attends Banker Meetings: More than 4 times per year    Marital Status: Widowed  Intimate Partner Violence: Not At Risk (04/09/2023)   Humiliation, Afraid, Rape, and Kick questionnaire    Fear of Current or Ex-Partner: No    Emotionally Abused: No    Physically Abused: No    Sexually Abused: No     Allergies  Allergen Reactions   Penicillins Hives and Rash    Has patient had a PCN reaction causing immediate rash, facial/tongue/throat swelling, SOB or lightheadedness with hypotension:unsure Has patient had a PCN reaction causing severe rash involving mucus membranes or skin necrosis:unsure Has patient had a PCN reaction that required hospitalization:No Has patient had a PCN reaction occurring  within the last 10 years:No If all of the above answers are "NO", then may proceed with Cephalosporin use. Childhood reaction      Outpatient Medications Prior to Visit  Medication Sig Dispense Refill   BOOSTRIX 5-2.5-18.5 LF-MCG/0.5 injection Inject 0.5 mLs into the muscle once.     CLINPRO 5000 1.1 % PSTE Place onto teeth daily.     eszopiclone (LUNESTA) 1 MG TABS tablet Take 1 mg by mouth  every evening.     Fluticasone-Umeclidin-Vilant (TRELEGY ELLIPTA) 100-62.5-25 MCG/ACT AEPB Inhale 1 puff into the lungs daily. 60 each 11   GEMTESA 75 MG TABS TAKE 75 MG BY MOUTH DAILY. 90 tablet 1   L-Theanine 200 MG CAPS Take 200 mg by mouth daily.     lansoprazole (PREVACID) 30 MG capsule TAKE 1 CAPSULE (30 MG TOTAL) BY MOUTH 2 (TWO) TIMES DAILY BEFORE A MEAL. 180 capsule 1   Milk Thistle 1000 MG CAPS Take 1 capsule by mouth daily.     Multiple Vitamin (MULTIVITAMIN) tablet Take 1 tablet by mouth daily.     PREVNAR 20 0.5 ML injection Inject 0.5 mLs into the muscle once.     rosuvastatin (CRESTOR) 10 MG tablet TAKE 1 TABLET BY MOUTH EVERY DAY 90 tablet 3   tamsulosin (FLOMAX) 0.4 MG CAPS capsule TAKE 1 CAPSULE BY MOUTH DAILY AT NIGHT. 90 capsule 1   valsartan-hydrochlorothiazide (DIOVAN-HCT) 80-12.5 MG tablet TAKE 1 TABLET BY MOUTH EVERY DAY 90 tablet 3   Facility-Administered Medications Prior to Visit  Medication Dose Route Frequency Provider Last Rate Last Admin   0.9 %  sodium chloride infusion  500 mL Intravenous Once Hilarie Fredrickson, MD       Review of Systems  Constitutional:  Negative for chills, fever, malaise/fatigue and weight loss.  HENT:  Negative for congestion, sinus pain and sore throat.   Eyes: Negative.   Respiratory:  Positive for cough, sputum production, shortness of breath and wheezing. Negative for hemoptysis.   Cardiovascular:  Negative for chest pain, palpitations, orthopnea, claudication and leg swelling.  Gastrointestinal:  Negative for abdominal pain, heartburn, nausea  and vomiting.  Genitourinary: Negative.   Musculoskeletal:  Negative for joint pain and myalgias.  Skin:  Negative for rash.  Neurological:  Negative for weakness.  Endo/Heme/Allergies: Negative.   Psychiatric/Behavioral: Negative.      Objective:   Vitals:   12/07/23 0925  BP: (!) 159/76  Pulse: 88  SpO2: 94%  Weight: 250 lb (113.4 kg)  Height: 5\' 11"  (1.803 m)   Physical Exam Constitutional:      General: He is not in acute distress.    Appearance: Normal appearance. He is obese.  Eyes:     General: No scleral icterus.    Conjunctiva/sclera: Conjunctivae normal.  Cardiovascular:     Rate and Rhythm: Normal rate and regular rhythm.  Pulmonary:     Breath sounds: No wheezing, rhonchi or rales.  Musculoskeletal:     Right lower leg: No edema.     Left lower leg: No edema.  Skin:    General: Skin is warm and dry.  Neurological:     General: No focal deficit present.    CBC    Component Value Date/Time   WBC 5.4 03/18/2023 1004   RBC 4.88 03/18/2023 1004   HGB 13.9 03/18/2023 1004   HGB 14.3 01/11/2020 1005   HCT 41.7 03/18/2023 1004   HCT 42.4 01/11/2020 1005   PLT 191.0 03/18/2023 1004   PLT 168 01/11/2020 1005   MCV 85.5 03/18/2023 1004   MCV 86 01/11/2020 1005   MCH 28.9 01/11/2020 1005   MCH 28.5 04/11/2019 1824   MCHC 33.3 03/18/2023 1004   RDW 15.5 03/18/2023 1004   RDW 14.0 01/11/2020 1005   LYMPHSABS 0.9 03/18/2023 1004   LYMPHSABS 1.1 01/11/2020 1005   MONOABS 0.8 03/18/2023 1004   EOSABS 0.2 03/18/2023 1004   EOSABS 0.2 01/11/2020 1005   BASOSABS 0.1 03/18/2023 1004  BASOSABS 0.1 01/11/2020 1005      Latest Ref Rng & Units 06/12/2023    3:13 PM 03/18/2023   10:04 AM 08/13/2022    3:39 PM  BMP  Glucose 70 - 99 mg/dL 92  161  90   BUN 8 - 27 mg/dL 24  23  19    Creatinine 0.76 - 1.27 mg/dL 0.96  0.45  4.09   BUN/Creat Ratio 10 - 24 20   16    Sodium 134 - 144 mmol/L 141  142  141   Potassium 3.5 - 5.2 mmol/L 4.0  4.1  4.0   Chloride 96  - 106 mmol/L 106  106  104   CO2 20 - 29 mmol/L 22  29  23    Calcium 8.6 - 10.2 mg/dL 9.3  9.5  8.9    Chest imaging: CT Chest 04/29/23 1. Lung-RADS 1S, negative. Continue annual screening with low-dose chest CT without contrast in 12 months. 2. The "S" modifier above refers to potentially clinically significant non lung cancer related findings. Specifically, there is evidence of probable interstitial lung disease. Outpatient referral to Pulmonology for further clinical evaluation is recommended. Follow-up nonemergent high-resolution chest CT should also be considered for further characterization. 3. Mild diffuse bronchial wall thickening with mild centrilobular and paraseptal emphysema; imaging findings suggestive of underlying COPD. 4. Hepatic steatosis  PFT:     No data to display          Labs:  Path:  Echo:  Heart Catheterization:    Assessment & Plan:   Centrilobular emphysema (HCC) - Plan: Pulmonary Function Test, albuterol (VENTOLIN HFA) 108 (90 Base) MCG/ACT inhaler  OSA (obstructive sleep apnea) - Plan: Home sleep test  Interstitial lung disease (HCC) - Plan: CT CHEST HIGH RESOLUTION, Pulmonary Function Test  Discussion: Tamas Suen is a 78 year old male, former smoker with history of GERD, hypertension, seasonal allergies and OSA who is referred to pulmonary clinic for emphysema.  Emphysema Chronic emphysema with significant smoking history. Persistent symptoms despite Trelegy inhaler use. Previous CT showed lung tissue destruction. - Continue Trelegy inhaler, one puff daily. - Order high-resolution CT chest scan. - Perform pulmonary function tests. - Prescribe albuterol inhaler for as-needed use.  Possible Interstitial Lung Disease CT suggests possible interstitial lung disease with peripheral changes. Differential includes inflammation or scarring. Smoking history is a risk factor. - Order high-resolution CT chest scan. - Perform pulmonary  function tests. - Consider inflammatory workup for autoimmune conditions if indicated.  Sleep Apnea Previous use of mandibular advancement device. Weight loss may have improved condition. Current symptoms include nocturia. - Order home sleep study.  Insomnia Chronic insomnia managed with Lunesta, effective in aiding sleep. - Continue Lunesta.  Follow-up - Schedule follow-up in three months.   Melody Comas, MD [facility] Office: [phone]   Current Outpatient Medications:    albuterol (VENTOLIN HFA) 108 (90 Base) MCG/ACT inhaler, Inhale 2 puffs into the lungs every 6 (six) hours as needed for wheezing or shortness of breath., Disp: 8 g, Rfl: 6   BOOSTRIX 5-2.5-18.5 LF-MCG/0.5 injection, Inject 0.5 mLs into the muscle once., Disp: , Rfl:    CLINPRO 5000 1.1 % PSTE, Place onto teeth daily., Disp: , Rfl:    eszopiclone (LUNESTA) 1 MG TABS tablet, Take 1 mg by mouth every evening., Disp: , Rfl:    Fluticasone-Umeclidin-Vilant (TRELEGY ELLIPTA) 100-62.5-25 MCG/ACT AEPB, Inhale 1 puff into the lungs daily., Disp: 60 each, Rfl: 11   GEMTESA 75 MG TABS,  TAKE 75 MG BY MOUTH DAILY., Disp: 90 tablet, Rfl: 1   L-Theanine 200 MG CAPS, Take 200 mg by mouth daily., Disp: , Rfl:    lansoprazole (PREVACID) 30 MG capsule, TAKE 1 CAPSULE (30 MG TOTAL) BY MOUTH 2 (TWO) TIMES DAILY BEFORE A MEAL., Disp: 180 capsule, Rfl: 1   Milk Thistle 1000 MG CAPS, Take 1 capsule by mouth daily., Disp: , Rfl:    Multiple Vitamin (MULTIVITAMIN) tablet, Take 1 tablet by mouth daily., Disp: , Rfl:    PREVNAR 20 0.5 ML injection, Inject 0.5 mLs into the muscle once., Disp: , Rfl:    rosuvastatin (CRESTOR) 10 MG tablet, TAKE 1 TABLET BY MOUTH EVERY DAY, Disp: 90 tablet, Rfl: 3   tamsulosin (FLOMAX) 0.4 MG CAPS capsule, TAKE 1 CAPSULE BY MOUTH DAILY AT NIGHT., Disp: 90 capsule, Rfl: 1   valsartan-hydrochlorothiazide (DIOVAN-HCT) 80-12.5 MG tablet, TAKE 1 TABLET BY MOUTH EVERY DAY, Disp: 90  tablet, Rfl: 3  Current Facility-Administered Medications:    0.9 %  sodium chloride infusion, 500 mL, Intravenous, Once, Hilarie Fredrickson, MD

## 2023-12-07 NOTE — Patient Instructions (Addendum)
 We will check pulmonary function tests and a high resolution CT Chest to evaluate the potential for interstitial lung disease  We will schedule you for home sleep study  Continue trelegy ellipta 1 puff daily - rinse mouth out after each use  Use albuterol inhaler 1-2 puffs every 4-6 hours as needed  Follow up in 3 months

## 2023-12-09 ENCOUNTER — Other Ambulatory Visit: Payer: Self-pay | Admitting: Internal Medicine

## 2023-12-09 DIAGNOSIS — K2270 Barrett's esophagus without dysplasia: Secondary | ICD-10-CM

## 2023-12-09 DIAGNOSIS — K219 Gastro-esophageal reflux disease without esophagitis: Secondary | ICD-10-CM

## 2023-12-10 ENCOUNTER — Other Ambulatory Visit: Payer: Self-pay | Admitting: Emergency Medicine

## 2023-12-10 DIAGNOSIS — R399 Unspecified symptoms and signs involving the genitourinary system: Secondary | ICD-10-CM

## 2023-12-10 DIAGNOSIS — N3281 Overactive bladder: Secondary | ICD-10-CM

## 2023-12-16 ENCOUNTER — Encounter: Payer: Self-pay | Admitting: Emergency Medicine

## 2023-12-16 ENCOUNTER — Encounter

## 2023-12-16 DIAGNOSIS — G4730 Sleep apnea, unspecified: Secondary | ICD-10-CM | POA: Diagnosis not present

## 2023-12-16 DIAGNOSIS — G4733 Obstructive sleep apnea (adult) (pediatric): Secondary | ICD-10-CM

## 2023-12-18 ENCOUNTER — Ambulatory Visit (HOSPITAL_COMMUNITY)
Admission: RE | Admit: 2023-12-18 | Discharge: 2023-12-18 | Disposition: A | Discharge status: Home / Self Care | Source: Ambulatory Visit | Attending: Pulmonary Disease | Admitting: Pulmonary Disease

## 2023-12-18 DIAGNOSIS — I700 Atherosclerosis of aorta: Secondary | ICD-10-CM | POA: Diagnosis not present

## 2023-12-18 DIAGNOSIS — J849 Interstitial pulmonary disease, unspecified: Secondary | ICD-10-CM | POA: Diagnosis not present

## 2023-12-18 DIAGNOSIS — E041 Nontoxic single thyroid nodule: Secondary | ICD-10-CM | POA: Diagnosis not present

## 2023-12-18 DIAGNOSIS — R918 Other nonspecific abnormal finding of lung field: Secondary | ICD-10-CM | POA: Diagnosis not present

## 2023-12-26 ENCOUNTER — Telehealth: Payer: Self-pay | Admitting: Pulmonary Disease

## 2023-12-26 DIAGNOSIS — G4733 Obstructive sleep apnea (adult) (pediatric): Secondary | ICD-10-CM | POA: Diagnosis not present

## 2023-12-26 NOTE — Telephone Encounter (Signed)
 Call patient  Sleep study result  Date of study: 12/16/2023  Impression: Moderate obstructive sleep apnea with moderate oxygen desaturations.  AHI of 20.9 with oxygen nadir of 77% . Recommendation: Recommend treatment of sleep disordered breathing Options of treatment may include previously used mandibular advancement device, CPAP therapy may be considered as an option of treatment, auto CPAP 5-15 with patient's mask of choice with heated humidification.  Clinical follow-up for optimization of treatment

## 2023-12-28 NOTE — Telephone Encounter (Signed)
 Spoke with patient regarding sleep study result's  Sleep study result   Date of study: 12/16/2023   Impression: Moderate obstructive sleep apnea with moderate oxygen desaturations.  AHI of 20.9 with oxygen nadir of 77% . Recommendation: Recommend treatment of sleep disordered breathing Options of treatment may include previously used mandibular advancement device, CPAP therapy may be considered as an option of treatment, auto CPAP 5-15 with patient's mask of choice with heated humidification.   Clinical follow-up for optimization of treatment  Patient stated he is going to wait until his next office visit with Dr.Dewald to discuss option's .   Patient's voice was understanding . Nothing else further needed.

## 2024-01-14 DIAGNOSIS — F411 Generalized anxiety disorder: Secondary | ICD-10-CM | POA: Diagnosis not present

## 2024-01-14 DIAGNOSIS — G4700 Insomnia, unspecified: Secondary | ICD-10-CM | POA: Diagnosis not present

## 2024-01-18 ENCOUNTER — Ambulatory Visit: Admitting: Orthopedic Surgery

## 2024-01-18 ENCOUNTER — Other Ambulatory Visit (INDEPENDENT_AMBULATORY_CARE_PROVIDER_SITE_OTHER): Payer: Self-pay

## 2024-01-18 VITALS — BP 123/72 | HR 76 | Ht 71.0 in | Wt 250.0 lb

## 2024-01-18 DIAGNOSIS — M48061 Spinal stenosis, lumbar region without neurogenic claudication: Secondary | ICD-10-CM

## 2024-01-18 DIAGNOSIS — M5416 Radiculopathy, lumbar region: Secondary | ICD-10-CM

## 2024-01-18 DIAGNOSIS — M5442 Lumbago with sciatica, left side: Secondary | ICD-10-CM

## 2024-01-18 DIAGNOSIS — G8929 Other chronic pain: Secondary | ICD-10-CM

## 2024-01-18 NOTE — Progress Notes (Signed)
 Orthopedic Spine Surgery Office Note  Assessment: Patient is a 78 y.o. male with left leg numbness, paresthesias and pain consistent with radiculopathy    Plan: -Explained that initially conservative treatment is tried as a significant number of patients may experience relief with these treatment modalities. Discussed that the conservative treatments include:  -activity modification  -physical therapy  -over the counter pain medications  -medrol  dosepak  -lyrica/gabapentin   -lumbar steroid injections -Patient has tried PT, Tylenol , gabapentin , chiropractor, lumbar steroid injections - Patient has tried the typical nonoperative treatments that I would recommend for this issue but none have provided him with significant relief so discussed surgery as an option.  Patient was interested in getting a second opinion.  Provided him with several other spine surgeons names in town -Patient should return to office on an as-needed basis   Patient expressed understanding of the plan and all questions were answered to the patient's satisfaction.   ___________________________________________________________________________   History:  Patient is a 78 y.o. male who presents today for lumbar spine.  Patient has had chronic back pain throughout his life.  He has times where it will hurt and then he does some treatment and goes away.  He said he is had these symptoms since he was in his 30s.  More recently, he has had pain, numbness, and paresthesias in his left leg.  He feels that mostly from the distal thigh into the leg.  His worst portion is along the lateral aspect.  He rarely gets some symptoms in the right foot as well.  He has tried multiple conservative treatments but has not noticed any relief with these treatments.   Weakness: Denies Symptoms of imbalance: Denies Paresthesias and numbness: Yes, has numbness and paresthesias in the left leg distal to the distal aspect of the thigh.  Sometimes  gets numbness and paresthesias in the right foot as well.  No other numbness or paresthesias Bowel or bladder incontinence: Has urinary urgency since a recent prostate surgery.  No other changes in his bowel or bladder habits.  No incontinence. Saddle anesthesia: Denies  Treatments tried: PT, Tylenol , gabapentin , chiropractor, lumbar steroid injections  Review of systems: Denies fevers and chills, night sweats, unexplained weight loss, history of cancer, pain that wakes him at night  Past medical history: HLD HTN GERD OSA Hemorrhoids  Allergies: penicillin  Past surgical history:  Left TKA Appendectomy Tonsillectomy Cataract surgery  Social history: Denies use of nicotine product (smoking, vaping, patches, smokeless) Alcohol use: Yes, approximately 3 drinks per week Denies recreational drug use   Physical Exam:  BMI of 34.9  General: no acute distress, appears stated age Neurologic: alert, answering questions appropriately, following commands Respiratory: unlabored breathing on room air, symmetric chest rise Psychiatric: appropriate affect, normal cadence to speech   MSK (spine):  -Strength exam      Left  Right EHL    5/5  5/5 TA    5/5  5/5 GSC    5/5  5/5 Knee extension  5/5  5/5 Hip flexion   5/5  5/5  -Sensory exam    Sensation intact to light touch in L3-S1 nerve distributions of bilateral lower extremities  -Achilles DTR: 1/4 on the left, 2/4 on the right -Patellar tendon DTR: 1/4 on the left, 1/4 on the right  -Straight leg raise: negative bilaterally  -Femoral nerve stretch test: negative bilaterally -Clonus: no beats bilaterally  -Left hip exam: no pain through range of motion -Right hip exam: no pain through range of  motion  Imaging: XRs of the lumbar spine from 01/18/2024 were independently reviewed and interpreted, showing disc height loss with anterior osteophyte formation at all levels in the lumbar spine.  No evidence of instability on  flexion/extension views.  No fracture or dislocation seen.  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  Left-sided foraminal stenosis at L3/4.  Right-sided foraminal stenosis at L4/5.  Bilateral foraminal stenosis at L5/S1.  DDD at all of the lumbar levels.   Patient name: Gregg Smith Patient MRN: [id number] Date of visit: 01/18/24

## 2024-01-20 NOTE — Progress Notes (Signed)
 Surgical Instructions   Your procedure is scheduled on Wednesday, June 4th, 2025. Report to [facility] Main Entrance "A" at 6:30 A.M., then check in with the Admitting office. Any questions or running late day of surgery: call [phone]  Questions prior to your surgery date: call [phone], Monday-Friday, 8am-4pm. If you experience any cold or flu symptoms such as cough, fever, chills, shortness of breath, etc. between now and your scheduled surgery, please notify us  at the above number.     Remember:  Do not eat after midnight the night before your surgery  You may drink clear liquids until 5:30 the morning of your surgery.   Clear liquids allowed are: Water , Non-Citrus Juices (without pulp), Carbonated Beverages, Clear Tea (no milk, honey, etc.), Black Coffee Only (NO MILK, CREAM OR POWDERED CREAMER of any kind), and Gatorade.    Take these medicines the morning of surgery with A SIP OF WATER : Gemtesa  Lansoprazole  (Prevacid ) Rosuvastatin  (Crestor ) Fluticasone -Umeclidin-Vilant (Trelegy Ellipta )   May take these medicines IF NEEDED: Albuterol  (Ventolin ) inhaler - bring with you on the day of surgery    One week prior to surgery, STOP taking any Aspirin  (unless otherwise instructed by your surgeon) Aleve, Naproxen, Ibuprofen, Motrin, Advil, Goody's, BC's, all herbal medications, fish oil, and non-prescription vitamins.                     Do NOT Smoke (Tobacco/Vaping) for 24 hours prior to your procedure.  If you use a CPAP at night, you may bring your mask/headgear for your overnight stay.   You will be asked to remove any contacts, glasses, piercing's, hearing aid's, dentures/partials prior to surgery. Please bring cases for these items if needed.    Patients discharged the day of surgery will not be allowed to drive home, and someone needs to stay with them for 24 hours.  SURGICAL WAITING ROOM VISITATION Patients may have no more than 2 support people in the waiting  area - these visitors may rotate.   Pre-op nurse will coordinate an appropriate time for 1 ADULT support person, who may not rotate, to accompany patient in pre-op.  Children under the age of 42 must have an adult with them who is not the patient and must remain in the main waiting area with an adult.  If the patient needs to stay at the hospital during part of their recovery, the visitor guidelines for inpatient rooms apply.  Please refer to the [facility] website for the visitor guidelines for any additional information.   If you received a COVID test during your pre-op visit  it is requested that you wear a mask when out in public, stay away from anyone that may not be feeling well and notify your surgeon if you develop symptoms. If you have been in contact with anyone that has tested positive in the last 10 days please notify you surgeon.      Pre-operative 5 CHG Bathing Instructions   You can play a key role in reducing the risk of infection after surgery. Your skin needs to be as free of germs as possible. You can reduce the number of germs on your skin by washing with CHG (chlorhexidine  gluconate) soap before surgery. CHG is an antiseptic soap that kills germs and continues to kill germs even after washing.   DO NOT use if you have an allergy to chlorhexidine /CHG or antibacterial soaps. If your skin becomes reddened or irritated, stop using the CHG and notify one of our  RNs at [phone].   Please shower with the CHG soap starting 4 days before surgery using the following schedule:     Please keep in mind the following:  DO NOT shave, including legs and underarms, starting the day of your first shower.   You may shave your face at any point before/day of surgery.  Place clean sheets on your bed the day you start using CHG soap. Use a clean washcloth (not used since being washed) for each shower. DO NOT sleep with pets once you start using the CHG.   CHG Shower Instructions:   Wash your face and private area with normal soap. If you choose to wash your hair, wash first with your normal shampoo.  After you use shampoo/soap, rinse your hair and body thoroughly to remove shampoo/soap residue.  Turn the water  OFF and apply about 3 tablespoons (45 ml) of CHG soap to a CLEAN washcloth.  Apply CHG soap ONLY FROM YOUR NECK DOWN TO YOUR TOES (washing for 3-5 minutes)  DO NOT use CHG soap on face, private areas, open wounds, or sores.  Pay special attention to the area where your surgery is being performed.  If you are having back surgery, having someone wash your back for you may be helpful. Wait 2 minutes after CHG soap is applied, then you may rinse off the CHG soap.  Pat dry with a clean towel  Put on clean clothes/pajamas   If you choose to wear lotion, please use ONLY the CHG-compatible lotions that are listed below.  Additional instructions for the day of surgery: DO NOT APPLY any lotions, deodorants, cologne, or perfumes.   Do not bring valuables to the hospital. [facility] is not responsible for any belongings/valuables. Do not wear nail polish, gel polish, artificial nails, or any other type of covering on natural nails (fingers and toes) Do not wear jewelry or makeup Put on clean/comfortable clothes.  Please brush your teeth.  Ask your nurse before applying any prescription medications to the skin.     CHG Compatible Lotions   Aveeno Moisturizing lotion  Cetaphil Moisturizing Cream  Cetaphil Moisturizing Lotion  Clairol Herbal Essence Moisturizing Lotion, Dry Skin  Clairol Herbal Essence Moisturizing Lotion, Extra Dry Skin  Clairol Herbal Essence Moisturizing Lotion, Normal Skin  Curel Age Defying Therapeutic Moisturizing Lotion with Alpha Hydroxy  Curel Extreme Care Body Lotion  Curel Soothing Hands Moisturizing Hand Lotion  Curel Therapeutic Moisturizing Cream, Fragrance-Free  Curel Therapeutic Moisturizing Lotion, Fragrance-Free  Curel  Therapeutic Moisturizing Lotion, Original Formula  Eucerin Daily Replenishing Lotion  Eucerin Dry Skin Therapy Plus Alpha Hydroxy Crme  Eucerin Dry Skin Therapy Plus Alpha Hydroxy Lotion  Eucerin Original Crme  Eucerin Original Lotion  Eucerin Plus Crme Eucerin Plus Lotion  Eucerin TriLipid Replenishing Lotion  Keri Anti-Bacterial Hand Lotion  Keri Deep Conditioning Original Lotion Dry Skin Formula Softly Scented  Keri Deep Conditioning Original Lotion, Fragrance Free Sensitive Skin Formula  Keri Lotion Fast Absorbing Fragrance Free Sensitive Skin Formula  Keri Lotion Fast Absorbing Softly Scented Dry Skin Formula  Keri Original Lotion  Keri Skin Renewal Lotion Keri Silky Smooth Lotion  Keri Silky Smooth Sensitive Skin Lotion  Nivea Body Creamy Conditioning Oil  Nivea Body Extra Enriched Teacher, adult education Moisturizing Lotion Nivea Crme  Nivea Skin Firming Lotion  NutraDerm 30 Skin Lotion  NutraDerm Skin Lotion  NutraDerm Therapeutic Skin Cream  NutraDerm Therapeutic Skin Lotion  ProShield Protective Hand Cream  Provon  moisturizing lotion  Please read over the following fact sheets that you were given.

## 2024-01-21 ENCOUNTER — Other Ambulatory Visit: Payer: Self-pay

## 2024-01-21 ENCOUNTER — Encounter (HOSPITAL_COMMUNITY): Payer: Self-pay

## 2024-01-21 ENCOUNTER — Encounter (HOSPITAL_COMMUNITY)
Admission: RE | Admit: 2024-01-21 | Discharge: 2024-01-21 | Disposition: A | Discharge status: Home / Self Care | Source: Ambulatory Visit | Attending: Orthopedic Surgery | Admitting: Orthopedic Surgery

## 2024-01-21 VITALS — BP 127/74 | HR 66 | Temp 97.8°F | Resp 19 | Ht 71.0 in | Wt 246.6 lb

## 2024-01-21 DIAGNOSIS — J849 Interstitial pulmonary disease, unspecified: Secondary | ICD-10-CM | POA: Insufficient documentation

## 2024-01-21 DIAGNOSIS — K219 Gastro-esophageal reflux disease without esophagitis: Secondary | ICD-10-CM | POA: Insufficient documentation

## 2024-01-21 DIAGNOSIS — E785 Hyperlipidemia, unspecified: Secondary | ICD-10-CM | POA: Insufficient documentation

## 2024-01-21 DIAGNOSIS — J439 Emphysema, unspecified: Secondary | ICD-10-CM | POA: Insufficient documentation

## 2024-01-21 DIAGNOSIS — E041 Nontoxic single thyroid nodule: Secondary | ICD-10-CM | POA: Insufficient documentation

## 2024-01-21 DIAGNOSIS — Z87891 Personal history of nicotine dependence: Secondary | ICD-10-CM | POA: Insufficient documentation

## 2024-01-21 DIAGNOSIS — I10 Essential (primary) hypertension: Secondary | ICD-10-CM | POA: Insufficient documentation

## 2024-01-21 DIAGNOSIS — Z01818 Encounter for other preprocedural examination: Secondary | ICD-10-CM

## 2024-01-21 DIAGNOSIS — G4733 Obstructive sleep apnea (adult) (pediatric): Secondary | ICD-10-CM | POA: Insufficient documentation

## 2024-01-21 DIAGNOSIS — Z01812 Encounter for preprocedural laboratory examination: Secondary | ICD-10-CM | POA: Insufficient documentation

## 2024-01-21 HISTORY — DX: Interstitial pulmonary disease, unspecified: J84.9

## 2024-01-21 HISTORY — DX: Emphysema, unspecified: J43.9

## 2024-01-21 LAB — BASIC METABOLIC PANEL WITH GFR
Anion gap: 8 (ref 5–15)
BUN: 23 mg/dL (ref 8–23)
CO2: 25 mmol/L (ref 22–32)
Calcium: 9.1 mg/dL (ref 8.9–10.3)
Chloride: 107 mmol/L (ref 98–111)
Creatinine, Ser: 1.06 mg/dL (ref 0.61–1.24)
GFR, Estimated: >60 mL/min (ref >60)
Glucose, Bld: 95 mg/dL (ref 70–99)
Potassium: 3.9 mmol/L (ref 3.5–5.1)
Sodium: 140 mmol/L (ref 135–145)

## 2024-01-21 LAB — CBC
HCT: 43.8 % (ref 39.0–52.0)
Hemoglobin: 14.5 g/dL (ref 13.0–17.0)
MCH: 29.1 pg (ref 26.0–34.0)
MCHC: 33.1 g/dL (ref 30.0–36.0)
MCV: 87.8 fL (ref 80.0–100.0)
Platelets: 174 10*3/uL (ref 150–400)
RBC: 4.99 MIL/uL (ref 4.22–5.81)
RDW: 15.1 % (ref 11.5–15.5)
WBC: 6.1 10*3/uL (ref 4.0–10.5)
nRBC: 0 % (ref 0.0–0.2)

## 2024-01-21 LAB — SURGICAL PCR SCREEN
MRSA, PCR: NEGATIVE
Staphylococcus aureus: NEGATIVE

## 2024-01-21 NOTE — Progress Notes (Signed)
 PCP - Dr. Maryagnes Small Cardiologist - Dr. Kevon Pellegrini, LOV 08/17/2023 Pulmonologist: Dr. Duaine German  PPM/ICD - denies Device Orders - na Rep Notified - na  Chest x-ray - 01/21/2023 EKG - 08/17/2023 Stress Test -  ECHO - 09/08/2022 Cardiac Cath -   Sleep Study - Diagnosed with sleep apnea. Was using a mouth appliance, had dental work and the appliance no longer fits CPAP - does not use  Non-diabetic  Blood Thinner Instructions: denies Aspirin  Instructions:denies  ERAS Protcol - Ensure until 0530  Anesthesia review: HTN, HLD, OSA, saw cardiology for edema  Patient denies shortness of breath, fever, cough and chest pain at PAT appointment   All instructions explained to the patient, with a verbal understanding of the material. Patient agrees to go over the instructions while at home for a better understanding. Patient also instructed to self quarantine after being tested for COVID-19. The opportunity to ask questions was provided.

## 2024-01-21 NOTE — Progress Notes (Signed)
 Surgical Instructions   Your procedure is scheduled on Wednesday, June 4th, 2025. Report to [facility] Main Entrance "A" at 6:30 A.M., then check in with the Admitting office. Any questions or running late day of surgery: call [phone]  Questions prior to your surgery date: call [phone], Monday-Friday, 8am-4pm. If you experience any cold or flu symptoms such as cough, fever, chills, shortness of breath, etc. between now and your scheduled surgery, please notify us  at the above number.     Remember:  Do not eat after midnight the night before your surgery  You may drink clear liquids until 5:30 the morning of your surgery.   Clear liquids allowed are: Water , Non-Citrus Juices (without pulp), Carbonated Beverages, Clear Tea (no milk, honey, etc.), Black Coffee Only (NO MILK, CREAM OR POWDERED CREAMER of any kind), and Gatorade.  Patient Instructions  The night before surgery:  No food after midnight. ONLY clear liquids after midnight  The day of surgery (if you do NOT have diabetes):  Drink ONE (1) Pre-Surgery Clear Ensure by 5:30 the morning of surgery. Drink in one sitting. Do not sip.  This drink was given to you during your hospital  pre-op appointment visit.  Nothing else to drink after completing the  Pre-Surgery Clear Ensure.         If you have questions, please contact your surgeon's office.   Take these medicines the morning of surgery with A SIP OF WATER : Gemtesa  Lansoprazole  (Prevacid ) Rosuvastatin  (Crestor ) Fluticasone -Umeclidin-Vilant (Trelegy Ellipta )   May take these medicines IF NEEDED: Albuterol  (Ventolin ) inhaler - bring with you on the day of surgery  One week prior to surgery, STOP taking any Aspirin  (unless otherwise instructed by your surgeon) Aleve, Naproxen, Ibuprofen, Motrin, Advil, Goody's, BC's, all herbal medications, fish oil, and non-prescription vitamins.                     Do NOT Smoke (Tobacco/Vaping) for 24 hours prior to your  procedure.  If you use a CPAP at night, you may bring your mask/headgear for your overnight stay.   You will be asked to remove any contacts, glasses, piercing's, hearing aid's, dentures/partials prior to surgery. Please bring cases for these items if needed.    Patients discharged the day of surgery will not be allowed to drive home, and someone needs to stay with them for 24 hours.  SURGICAL WAITING ROOM VISITATION Patients may have no more than 2 support people in the waiting area - these visitors may rotate.   Pre-op nurse will coordinate an appropriate time for 1 ADULT support person, who may not rotate, to accompany patient in pre-op.  Children under the age of 50 must have an adult with them who is not the patient and must remain in the main waiting area with an adult.  If the patient needs to stay at the hospital during part of their recovery, the visitor guidelines for inpatient rooms apply.  Please refer to the [facility] website for the visitor guidelines for any additional information.   If you received a COVID test during your pre-op visit  it is requested that you wear a mask when out in public, stay away from anyone that may not be feeling well and notify your surgeon if you develop symptoms. If you have been in contact with anyone that has tested positive in the last 10 days please notify you surgeon.      Pre-operative 5 CHG Bathing Instructions   You can  play a key role in reducing the risk of infection after surgery. Your skin needs to be as free of germs as possible. You can reduce the number of germs on your skin by washing with CHG (chlorhexidine  gluconate) soap before surgery. CHG is an antiseptic soap that kills germs and continues to kill germs even after washing.   DO NOT use if you have an allergy to chlorhexidine /CHG or antibacterial soaps. If your skin becomes reddened or irritated, stop using the CHG and notify one of our RNs at [phone].   Please  shower with the CHG soap starting 4 days before surgery using the following schedule:     Please keep in mind the following:  DO NOT shave, including legs and underarms, starting the day of your first shower.   You may shave your face at any point before/day of surgery.  Place clean sheets on your bed the day you start using CHG soap. Use a clean washcloth (not used since being washed) for each shower. DO NOT sleep with pets once you start using the CHG.   CHG Shower Instructions:  Wash your face and private area with normal soap. If you choose to wash your hair, wash first with your normal shampoo.  After you use shampoo/soap, rinse your hair and body thoroughly to remove shampoo/soap residue.  Turn the water  OFF and apply about 3 tablespoons (45 ml) of CHG soap to a CLEAN washcloth.  Apply CHG soap ONLY FROM YOUR NECK DOWN TO YOUR TOES (washing for 3-5 minutes)  DO NOT use CHG soap on face, private areas, open wounds, or sores.  Pay special attention to the area where your surgery is being performed.  If you are having back surgery, having someone wash your back for you may be helpful. Wait 2 minutes after CHG soap is applied, then you may rinse off the CHG soap.  Pat dry with a clean towel  Put on clean clothes/pajamas   If you choose to wear lotion, please use ONLY the CHG-compatible lotions that are listed below.  Additional instructions for the day of surgery: DO NOT APPLY any lotions, deodorants, cologne, or perfumes.   Do not bring valuables to the hospital. [facility] is not responsible for any belongings/valuables. Do not wear nail polish, gel polish, artificial nails, or any other type of covering on natural nails (fingers and toes) Do not wear jewelry or makeup Put on clean/comfortable clothes.  Please brush your teeth.  Ask your nurse before applying any prescription medications to the skin.     CHG Compatible Lotions   Aveeno Moisturizing lotion  Cetaphil  Moisturizing Cream  Cetaphil Moisturizing Lotion  Clairol Herbal Essence Moisturizing Lotion, Dry Skin  Clairol Herbal Essence Moisturizing Lotion, Extra Dry Skin  Clairol Herbal Essence Moisturizing Lotion, Normal Skin  Curel Age Defying Therapeutic Moisturizing Lotion with Alpha Hydroxy  Curel Extreme Care Body Lotion  Curel Soothing Hands Moisturizing Hand Lotion  Curel Therapeutic Moisturizing Cream, Fragrance-Free  Curel Therapeutic Moisturizing Lotion, Fragrance-Free  Curel Therapeutic Moisturizing Lotion, Original Formula  Eucerin Daily Replenishing Lotion  Eucerin Dry Skin Therapy Plus Alpha Hydroxy Crme  Eucerin Dry Skin Therapy Plus Alpha Hydroxy Lotion  Eucerin Original Crme  Eucerin Original Lotion  Eucerin Plus Crme Eucerin Plus Lotion  Eucerin TriLipid Replenishing Lotion  Keri Anti-Bacterial Hand Lotion  Keri Deep Conditioning Original Lotion Dry Skin Formula Softly Scented  Keri Deep Conditioning Original Lotion, Fragrance Free Sensitive Skin Formula  Keri Lotion Fast Absorbing Fragrance Free  Sensitive Skin Formula  Keri Lotion Fast Absorbing Softly Scented Dry Skin Formula  Keri Original Lotion  Keri Skin Renewal Lotion Keri Silky Smooth Lotion  Keri Silky Smooth Sensitive Skin Lotion  Nivea Body Creamy Conditioning Oil  Nivea Body Extra Enriched Lotion  Nivea Body Original Lotion  Nivea Body Sheer Moisturizing Lotion Nivea Crme  Nivea Skin Firming Lotion  NutraDerm 30 Skin Lotion  NutraDerm Skin Lotion  NutraDerm Therapeutic Skin Cream  NutraDerm Therapeutic Skin Lotion  ProShield Protective Hand Cream  Provon moisturizing lotion  Please read over the following fact sheets that you were given.

## 2024-01-22 ENCOUNTER — Encounter (HOSPITAL_COMMUNITY): Payer: Self-pay

## 2024-01-22 NOTE — Progress Notes (Signed)
 Anesthesia Chart Review:  Case: [id number] Date/Time: 02/03/24 0815   Procedure: ARTHROPLASTY, KNEE, TOTAL (Right: Knee)   Anesthesia type: Choice   Diagnosis: Primary osteoarthritis of right knee [M17.11]   Pre-op diagnosis: Osteoarthritis Right knee   [facility]: [facility] ROOM 04 / [facility]   Surgeons: Timothy Ford, MD       DISCUSSION: Patient is 78 year old male scheduled for the above procedure.  History includes former smoker, emphysema, HTN, HLD, GERD, OSA (moderate OSA 12/2023; not currently using CPAP and oral appliance not longer fits after dental work), anxiety, osteoarthritis (left TKA 08/11/16). No CAD, CAC 0 by CCTA on 02/20/20.  He was referred to cardiology in May 2021 for chest pain and DOE. Coronary CTA and TTE were ordered. 02/20/20 CCTA showed no CAD, CAC of 0, dilated pulmonary artery measuring up to 36mm in main PA and 36mm in RPA, moderate RV/RA enlargement, 37 mm ascending aorta. 02/15/20 TTE showed LVEF 60-65%, no RWMA, grade 1 DD, normal RV systolic function, normal PASP, estimated RVSP 24.5 mmHg, trivial MR, mild AI. He has been followed yearly. On 09/08/22 he had an updated TTE due to LE edema which showed LVEF 60-65%, no RWMA, mild concentric LVH, normal diastolic parameters, normal RV systolic function, trivial MR/TR, ascending aorta 39 mm. Last visit was on 08/17/23 with Dr. Alda Amas. He was doing well and had lost 35-40 lbs going to the [employer] 3 days/week using the treadmill and weight machines. He still had some LE edema but improved. Denied CV symptoms. One year follow-up planned.   He was referred to pulmonology after LCS chest CT in 04/2023 showed mild diffuse bronchial wall thickening and emphysema, as well as evidence of probably ILD. He was on Trelegy but still noted some exertional dyspnea and occasional wheezing with activities. He had formerly smoked 2PPD but quite in 2000.  He added as needed albuterol  and ordered a high resolution chest CT to further evaluate for ILD.   This was done on 12/18/23 and showed mild basilar fibrotic interstitial lung disease, 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 There was also enlarged pulmonary arteries suggesting PAH, and he had an incidental left thyroid nodule with follow-up US  recommended. Follow-up visit with PFTs is scheduled for 03/09/24. He did have a repeat sleep study on 12/16/23 that showed moderate OSA with AHI 20.9 with O2 nadir of 77%.  Anesthesia team to evaluate on the day of surgery.    VS: BP 127/74   Pulse 66   Temp 36.6 C   Resp 19   Ht 5\' 11"  (1.803 m)   Wt 111.9 kg   SpO2 98%   BMI 34.39 kg/m    PROVIDERS: Elvira Hammersmith, MD is PCP  Carson Clara, MD is cardiologist Duaine German, MD is pulmonoloigist Legrand Puma, MD is GI   LABS: Labs reviewed: Acceptable for surgery. A1c 5.5% 03/18/23. (all labs ordered are listed, but only abnormal results are displayed)  Labs Reviewed  SURGICAL PCR SCREEN  BASIC METABOLIC PANEL WITH GFR  CBC    Sleep Study 12/16/23: Impression: Moderate obstructive sleep apnea with moderate oxygen desaturations.  AHI of 20.9 with oxygen nadir of 77%.   IMAGES: Xray L-spine 01/18/24: "XRs of the lumbar spine from 01/18/2024 were independently reviewed and interpreted, showing disc height loss with anterior osteophyte formation at all levels in the lumbar spine.  No evidence of instability on flexion/extension views.  No fracture or dislocation seen."    CT Chest High Resolution 12/18/23: IMPRESSION:  1. 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 Findings are categorized as probable UIP per consensus guidelines: Diagnosis of Idiopathic Pulmonary Fibrosis: An Official ATS/ERS/JRS/ALAT Clinical Practice Guideline. Am Annie Barton Crit Care Med Vol 198, Iss 5, [id number], May 02 2017. 2. 1.6 cm low-attenuation left thyroid nodule. Recommend thyroid ultrasound. (Ref: J Am Coll Radiol. 2015 Feb;12(2):  143-50). 3.  Aortic atherosclerosis (ICD10-I70.0). 4. Enlarged right and left pulmonary arteries, indicative of pulmonary arterial hypertension.      EKG: 08/17/23: NSR   CV: Echo 09/08/22: IMPRESSIONS   1. Left ventricular ejection fraction, by estimation, is 60 to 65%. The  left ventricle has normal function. The left ventricle has no regional  wall motion abnormalities. There is mild concentric left ventricular  hypertrophy. Left ventricular diastolic  parameters were normal.   2. Right ventricular systolic function is normal. The right ventricular  size is not well visualized.   3. The mitral valve is grossly normal. Trivial mitral valve  regurgitation. No evidence of mitral stenosis.   4. The aortic valve is tricuspid. Aortic valve regurgitation is trivial.  Aortic valve sclerosis is present, with no evidence of aortic valve  stenosis.   5. Aortic dilatation noted. There is borderline dilatation of the  ascending aorta, measuring 39 mm.   6. The inferior vena cava is normal in size with greater than 50%  respiratory variability, suggesting right atrial pressure of 3 mmHg.  - Comparison(s): Changes from prior study are noted. Borderline dilation of ascending aorta, not as well visualized on prior study.  - Conclusion(s)/Recommendation(s): Otherwise normal echocardiogram, with minor abnormalities described in the report.  - Normal PASP, estimated RVSP 24.5 mmHg 02/15/20 TTE.   CTA Coronary 02/20/20: IMPRESSION: 1. Coronary calcium  score of 0.  2. Normal coronary origin with right dominance. 3. No evidence of CAD. 4. Dilated pulmonary artery measuring up to 36mm in main PA and 36mm in RPA. Moderate RV/RA enlargement 5. Dilated ascending aorta measuring up to 37mm CAD-RADS 0. No evidence of CAD (0%). Consider non-atherosclerotic causes of chest pain.    Past Medical History:  Diagnosis Date   Allergy    seasonal    Anxiety    Arthritis    Barrett's esophagus     Cataract    Chicken pox    Colon polyps    hyperplastic   Diverticulosis    Emphysema lung (HCC)    GERD (gastroesophageal reflux disease)    Hemorrhoids    Hyperlipidemia    Hypertension    ILD (interstitial lung disease) (HCC)    12/18/23 Mild basilar fibrotic interstitial lung disease, 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   Insomnia    Leg cramps    Measles    Mumps    Obstructive sleep apnea    Pneumonia    Sleep apnea    dental device; no longer uses    Past Surgical History:  Procedure Laterality Date   APPENDECTOMY  1957   CATARACT EXTRACTION     COLONOSCOPY     JOINT REPLACEMENT     TONSILLECTOMY     TONSILLECTOMY AND ADENOIDECTOMY  1954   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   Procedure: LEFT TOTAL KNEE ARTHROPLASTY;  Surgeon: Claiborne Crew, MD;  Location: [facility];  Service: Orthopedics;  Laterality: Left;   UPPER GASTROINTESTINAL ENDOSCOPY      MEDICATIONS:  albuterol  (VENTOLIN  HFA) 108 (90 Base) MCG/ACT inhaler   eszopiclone (LUNESTA) 2 MG TABS tablet   Fluticasone -Umeclidin-Vilant (  TRELEGY ELLIPTA ) 100-62.5-25 MCG/ACT AEPB   GEMTESA  75 MG TABS   L-Theanine 200 MG CAPS   lansoprazole  (PREVACID ) 30 MG capsule   Milk Thistle 1000 MG CAPS   Multiple Vitamin (MULTIVITAMIN) tablet   OVER THE COUNTER MEDICATION   rosuvastatin  (CRESTOR ) 10 MG tablet   tamsulosin  (FLOMAX ) 0.4 MG CAPS capsule   valsartan -hydrochlorothiazide  (DIOVAN -HCT) 80-12.5 MG tablet    0.9 %  sodium chloride  infusion    Ella Gun, PA-C Surgical Short Stay/Anesthesiology [facility] Phone [phone] [facility] Phone [phone] 01/22/2024 8:46 PM

## 2024-01-22 NOTE — Anesthesia Preprocedure Evaluation (Addendum)
 Anesthesia Evaluation  Patient identified by MRN, date of birth, ID band Patient awake    Reviewed: Allergy & Precautions, NPO status , Patient's Chart, lab work & pertinent test results  Airway Mallampati: III  TM Distance: >3 FB Neck ROM: Full    Dental  (+) Dental Advisory Given, Implants Upper, permanent bridge :   Pulmonary sleep apnea , COPD, former smoker ILD   Pulmonary exam normal breath sounds clear to auscultation       Cardiovascular hypertension, Pt. on medications (-) angina (-) Past MI Normal cardiovascular exam Rhythm:Regular Rate:Normal     Neuro/Psych  PSYCHIATRIC DISORDERS Anxiety      Neuromuscular disease    GI/Hepatic Neg liver ROS,GERD  Medicated and Controlled,,  Endo/Other  Obesity   Renal/GU negative Renal ROS     Musculoskeletal  (+) Arthritis ,    Abdominal   Peds  Hematology negative hematology ROS (+) Plt 174k   Anesthesia Other Findings Day of surgery medications reviewed with the patient.  Reproductive/Obstetrics                             Anesthesia Physical Anesthesia Plan  ASA: 2  Anesthesia Plan: Spinal   Post-op Pain Management: Regional block*, Ofirmev  IV (intra-op)* and Toradol  IV (intra-op)*   Induction: Intravenous  PONV Risk Score and Plan: 1 and TIVA, Dexamethasone  and Ondansetron   Airway Management Planned: Natural Airway and Simple Face Mask  Additional Equipment:   Intra-op Plan:   Post-operative Plan:   Informed Consent: I have reviewed the patients History and Physical, chart, labs and discussed the procedure including the risks, benefits and alternatives for the proposed anesthesia with the patient or authorized representative who has indicated his/her understanding and acceptance.     Dental advisory given  Plan Discussed with: CRNA, Anesthesiologist and Surgeon  Anesthesia Plan Comments: (PAT note written 01/22/2024 by  Allison Zelenak, PA-C.  )       Anesthesia Quick Evaluation

## 2024-01-28 ENCOUNTER — Telehealth: Payer: Self-pay | Admitting: Orthopedic Surgery

## 2024-01-28 NOTE — Telephone Encounter (Signed)
 Patient called and said he wanted you to call him because he has a lot questions about the surgery. CB#[phone]

## 2024-01-29 ENCOUNTER — Encounter: Payer: Self-pay | Admitting: *Deleted

## 2024-01-29 NOTE — Telephone Encounter (Signed)
 This patient is sch for a left total knee next week. Please see below.

## 2024-02-01 NOTE — Telephone Encounter (Signed)
 Correction it is for a right

## 2024-02-02 ENCOUNTER — Telehealth: Payer: Self-pay | Admitting: *Deleted

## 2024-02-02 MED ORDER — TRANEXAMIC ACID 1000 MG/10ML IV SOLN
2000.0000 mg | INTRAVENOUS | Status: DC
  Filled 2024-02-02: qty 20

## 2024-02-02 NOTE — Telephone Encounter (Signed)
 [facility] RNCM pre-op  call completed.

## 2024-02-02 NOTE — Care Plan (Signed)
 [facility] RNCM call to patient and discussed his upcoming Right total knee arthroplasty with Dr. Julio Ohm on 02/03/24 at [facility]. He is agreeable to case management. He lives with his son and his plan is to return home with assistance from him. He does have a RW. An ice machine will be provided. Anticipate HHPT will be needed after a short hospital stay. Referral made to [facility] after choice provided. Reviewed all post op care instructions and questions answered. Will continue to follow for needs.

## 2024-02-02 NOTE — H&P (Signed)
 TOTAL KNEE ADMISSION H&P  Patient is being admitted for right total knee arthroplasty.  Subjective:  Chief Complaint:right knee pain.  HPI: Gregg Smith, 78 y.o. male, has a history of pain and functional disability in the right knee due to arthritis and has failed non-surgical conservative treatments for greater than 12 weeks to includeNSAID's and/or analgesics, corticosteriod injections, viscosupplementation injections, flexibility and strengthening excercises, supervised PT with diminished ADL's post treatment, use of assistive devices, weight reduction as appropriate, and activity modification.  Onset of symptoms was gradual, starting >10 years ago with gradually worsening course since that time. The patient noted no past surgery on the right knee(s).  Patient currently rates pain in the right knee(s) at 8 out of 10 with activity. Patient has night pain, worsening of pain with activity and weight bearing, pain that interferes with activities of daily living, pain with passive range of motion, crepitus, and joint swelling.  Patient has evidence of subchondral cysts, subchondral sclerosis, periarticular osteophytes, joint subluxation, and joint space narrowing by imaging studies. This patient has had avascular necrosis of the knee. There is no active infection.  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   Chronic pain of right knee 09/21/2023   Centrilobular emphysema (HCC) 06/04/2023   Interstitial lung disease (HCC) 06/04/2023   Chronic cough 02/03/2023   Overactive bladder 09/17/2022   Aortic atherosclerosis (HCC) 03/17/2022   Acute insomnia 10/23/2021   Chronic left-sided low back pain with left-sided sciatica 03/05/2021   Osteoarthritis of knee 01/31/2021   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   Scoliosis deformity of spine 04/22/2019   Generalized anxiety  disorder    Panic disorder    Insomnia    Spinal stenosis of lumbar region 11/30/2018   GERD (gastroesophageal reflux disease) 08/14/2017   S/P left TKA 08/11/2016   S/P knee replacement 08/11/2016   OSA (obstructive sleep apnea) 03/22/2014   Snoring 05/09/2013   Essential hypertension 02/11/2013   Dyslipidemia 02/11/2013   Arthritis 02/11/2013   Past Medical History:  Diagnosis Date   Allergy    seasonal    Anxiety    Arthritis    Barrett's esophagus    Cataract    Chicken pox    Colon polyps    hyperplastic   Diverticulosis    Emphysema lung (HCC)    GERD (gastroesophageal reflux disease)    Hemorrhoids    Hyperlipidemia    Hypertension    ILD (interstitial lung disease) (HCC)    12/18/23 Mild basilar fibrotic interstitial lung disease, 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   Insomnia    Leg cramps    Measles    Mumps    Obstructive sleep apnea    Pneumonia    Sleep apnea    dental device; no longer uses    Past Surgical History:  Procedure Laterality Date   APPENDECTOMY  1957   CATARACT EXTRACTION     COLONOSCOPY     JOINT REPLACEMENT     TONSILLECTOMY     TONSILLECTOMY AND ADENOIDECTOMY  1954   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   Procedure: LEFT TOTAL KNEE ARTHROPLASTY;  Surgeon: Claiborne Crew, MD;  Location: [facility];  Service: Orthopedics;  Laterality: Left;   UPPER GASTROINTESTINAL ENDOSCOPY      Current Facility-Administered Medications  Medication Dose Route Frequency Provider Last Rate Last Admin   0.9 %  sodium chloride  infusion  500 mL Intravenous Once Tobin Forts, MD       Current Outpatient Medications  Medication Sig Dispense Refill Last Dose/Taking   albuterol  (VENTOLIN  HFA) 108 (90 Base) MCG/ACT inhaler Inhale 2 puffs into the lungs every 6 (six) hours as needed for wheezing or shortness of breath. 8 g 6 Taking As Needed   eszopiclone (LUNESTA) 2 MG TABS tablet Take 1 mg by mouth at bedtime.   Taking    Fluticasone -Umeclidin-Vilant (TRELEGY ELLIPTA ) 100-62.5-25 MCG/ACT AEPB Inhale 1 puff into the lungs daily. 60 each 11 Taking   GEMTESA  75 MG TABS TAKE 75 MG BY MOUTH DAILY. 90 tablet 1 Taking   L-Theanine 200 MG CAPS Take 200 mg by mouth daily.   Taking   lansoprazole  (PREVACID ) 30 MG capsule TAKE 1 CAPSULE (30 MG TOTAL) BY MOUTH 2 (TWO) TIMES DAILY BEFORE A MEAL. 180 capsule 1 Taking   Milk Thistle 1000 MG CAPS Take 1,000 mg by mouth daily.   Taking   Multiple Vitamin (MULTIVITAMIN) tablet Take 1 tablet by mouth daily.   Taking   OVER THE COUNTER MEDICATION Take 1 tablet by mouth daily. Calm Aid   Taking   rosuvastatin  (CRESTOR ) 10 MG tablet TAKE 1 TABLET BY MOUTH EVERY DAY 90 tablet 3 Taking   tamsulosin  (FLOMAX ) 0.4 MG CAPS capsule TAKE 1 CAPSULE BY MOUTH DAILY AT NIGHT. 90 capsule 1 Taking   valsartan -hydrochlorothiazide  (DIOVAN -HCT) 80-12.5 MG tablet TAKE 1 TABLET BY MOUTH EVERY DAY 90 tablet 3 Taking   Allergies  Allergen Reactions   Penicillins Hives and Rash    Has patient had a PCN reaction causing immediate rash, facial/tongue/throat swelling, SOB or lightheadedness with hypotension:unsure Has patient had a PCN reaction causing severe rash involving mucus membranes or skin necrosis:unsure Has patient had a PCN reaction that required hospitalization:No Has patient had a PCN reaction occurring within the last 10 years:No If all of the above answers are "NO", then may proceed with Cephalosporin use. Childhood reaction     Social History   Tobacco Use   Smoking status: Former    Current packs/day: 0.00    Average packs/day: 1 pack/day for 40.0 years (40.0 ttl pk-yrs)    Types: Cigarettes    Start date: 01/19/1967    Quit date: 01/19/2007    Years since quitting: 17.0   Smokeless tobacco: Never  Substance Use Topics   Alcohol use: Yes    Alcohol/week: 3.0 standard drinks of alcohol    Types: 3 Cans of beer per week    Family History  Problem Relation Age of Onset    Alzheimer's disease Mother    Cancer Father    Stomach cancer Father    Aortic aneurysm Son    Heart disease Son    Cancer Maternal Grandmother    Heart disease Maternal Grandfather    Stomach cancer Paternal Grandfather    Colon cancer Neg Hx    Colon polyps Neg Hx    Esophageal cancer Neg Hx    Rectal cancer Neg Hx    Pancreatic cancer Neg Hx    Liver cancer Neg Hx      Review of Systems  All other systems reviewed and are negative.   Objective:  Physical Exam  Vital signs in last 24 hours:    Labs:   Estimated body mass index is 34.39 kg/m as calculated from the following:   Height as of 01/21/24: 5\' 11"  (1.803 m).   Weight as of 01/21/24: 111.9 kg.  Imaging Review Plain radiographs demonstrate severe degenerative joint disease of the right knee(s). The overall alignment issignificant valgus. The bone quality appears to be adequate for age and reported activity level.      Assessment/Plan:  End stage arthritis, right knee   The patient history, physical examination, clinical judgment of the provider and imaging studies are consistent with end stage degenerative joint disease of the right knee(s) and total knee arthroplasty is deemed medically necessary. The treatment options including medical management, injection therapy arthroscopy and arthroplasty were discussed at length. The risks and benefits of total knee arthroplasty were presented and reviewed. The risks due to aseptic loosening, infection, stiffness, patella tracking problems, thromboembolic complications and other imponderables were discussed. The patient acknowledged the explanation, agreed to proceed with the plan and consent was signed. Patient is being admitted for inpatient treatment for surgery, pain control, PT, OT, prophylactic antibiotics, VTE prophylaxis, progressive ambulation and ADL's and discharge planning. The patient is planning to be discharged home with home health  services     Patient's anticipated LOS is less than 2 midnights, meeting these requirements: - Younger than 70 - Lives within 1 hour of care - Has a competent adult at home to recover with post-op recover - NO history of  - Chronic pain requiring opiods  - Diabetes  - Coronary Artery Disease  - Heart failure  - Heart attack  - Stroke  - DVT/VTE  - Cardiac arrhythmia  - Respiratory Failure/COPD  - Renal failure  - Anemia  - Advanced Liver disease

## 2024-02-03 ENCOUNTER — Other Ambulatory Visit: Payer: Self-pay

## 2024-02-03 ENCOUNTER — Ambulatory Visit (HOSPITAL_COMMUNITY): Payer: Self-pay

## 2024-02-03 ENCOUNTER — Encounter (HOSPITAL_COMMUNITY): Payer: Self-pay | Admitting: Orthopedic Surgery

## 2024-02-03 ENCOUNTER — Ambulatory Visit (HOSPITAL_COMMUNITY): Payer: Self-pay | Admitting: Vascular Surgery

## 2024-02-03 ENCOUNTER — Encounter (HOSPITAL_COMMUNITY)
Admission: RE | Disposition: A | Discharge status: Home / Self Care | Payer: Self-pay | Source: Home / Self Care | Attending: Orthopedic Surgery

## 2024-02-03 ENCOUNTER — Ambulatory Visit (HOSPITAL_COMMUNITY)
Admission: RE | Admit: 2024-02-03 | Discharge: 2024-02-04 | Disposition: A | Discharge status: Home / Self Care | Attending: Orthopedic Surgery | Admitting: Orthopedic Surgery

## 2024-02-03 DIAGNOSIS — M1711 Unilateral primary osteoarthritis, right knee: Secondary | ICD-10-CM

## 2024-02-03 DIAGNOSIS — F419 Anxiety disorder, unspecified: Secondary | ICD-10-CM | POA: Insufficient documentation

## 2024-02-03 DIAGNOSIS — I700 Atherosclerosis of aorta: Secondary | ICD-10-CM | POA: Insufficient documentation

## 2024-02-03 DIAGNOSIS — I10 Essential (primary) hypertension: Secondary | ICD-10-CM | POA: Diagnosis not present

## 2024-02-03 DIAGNOSIS — Z79899 Other long term (current) drug therapy: Secondary | ICD-10-CM | POA: Insufficient documentation

## 2024-02-03 DIAGNOSIS — E669 Obesity, unspecified: Secondary | ICD-10-CM | POA: Diagnosis not present

## 2024-02-03 DIAGNOSIS — G4733 Obstructive sleep apnea (adult) (pediatric): Secondary | ICD-10-CM | POA: Diagnosis not present

## 2024-02-03 DIAGNOSIS — Z7951 Long term (current) use of inhaled steroids: Secondary | ICD-10-CM | POA: Diagnosis not present

## 2024-02-03 DIAGNOSIS — Z6834 Body mass index (BMI) 34.0-34.9, adult: Secondary | ICD-10-CM | POA: Diagnosis not present

## 2024-02-03 DIAGNOSIS — K219 Gastro-esophageal reflux disease without esophagitis: Secondary | ICD-10-CM | POA: Diagnosis not present

## 2024-02-03 DIAGNOSIS — Z87891 Personal history of nicotine dependence: Secondary | ICD-10-CM | POA: Diagnosis not present

## 2024-02-03 DIAGNOSIS — G8918 Other acute postprocedural pain: Secondary | ICD-10-CM | POA: Diagnosis not present

## 2024-02-03 DIAGNOSIS — J432 Centrilobular emphysema: Secondary | ICD-10-CM | POA: Insufficient documentation

## 2024-02-03 DIAGNOSIS — M1990 Unspecified osteoarthritis, unspecified site: Secondary | ICD-10-CM | POA: Diagnosis present

## 2024-02-03 HISTORY — PX: TOTAL KNEE ARTHROPLASTY: SHX125

## 2024-02-03 SURGERY — ARTHROPLASTY, KNEE, TOTAL
Anesthesia: Monitor Anesthesia Care | Site: Knee | Laterality: Right

## 2024-02-03 MED ORDER — METOCLOPRAMIDE HCL 5 MG/ML IJ SOLN: 5.0000 mg | Freq: Three times a day (TID) | INTRAMUSCULAR | Status: DC | PRN

## 2024-02-03 MED ORDER — OXYCODONE HCL 5 MG PO TABS
5.0000 mg | ORAL_TABLET | ORAL | Status: DC | PRN
  Administered 2024-02-03 – 2024-02-04 (×3): 10 mg via ORAL
  Filled 2024-02-03 (×2): qty 2

## 2024-02-03 MED ORDER — TAMSULOSIN HCL 0.4 MG PO CAPS
0.4000 mg | ORAL_CAPSULE | Freq: Every day | ORAL | Status: DC
  Administered 2024-02-03: 0.4 mg via ORAL
  Filled 2024-02-03: qty 1

## 2024-02-03 MED ORDER — FENTANYL CITRATE (PF) 100 MCG/2ML IJ SOLN: 25.0000 ug | INTRAMUSCULAR | Status: DC | PRN

## 2024-02-03 MED ORDER — SENNOSIDES-DOCUSATE SODIUM 8.6-50 MG PO TABS: 1.0000 | ORAL_TABLET | Freq: Every evening | ORAL | Status: DC | PRN

## 2024-02-03 MED ORDER — ZOLPIDEM TARTRATE 5 MG PO TABS
5.0000 mg | ORAL_TABLET | Freq: Every evening | ORAL | Status: DC | PRN
  Administered 2024-02-03: 5 mg via ORAL
  Filled 2024-02-03: qty 1

## 2024-02-03 MED ORDER — BUDESON-GLYCOPYRROL-FORMOTEROL 160-9-4.8 MCG/ACT IN AERO
2.0000 | INHALATION_SPRAY | Freq: Two times a day (BID) | RESPIRATORY_TRACT | Status: DC
  Administered 2024-02-04: 2 via RESPIRATORY_TRACT
  Filled 2024-02-03: qty 5.9

## 2024-02-03 MED ORDER — FENTANYL CITRATE (PF) 250 MCG/5ML IJ SOLN
INTRAMUSCULAR | Status: DC | PRN
  Administered 2024-02-03: 50 ug via INTRAVENOUS

## 2024-02-03 MED ORDER — PHENYLEPHRINE 80 MCG/ML (10ML) SYRINGE FOR IV PUSH (FOR BLOOD PRESSURE SUPPORT): PREFILLED_SYRINGE | INTRAVENOUS | Status: DC | PRN

## 2024-02-03 MED ORDER — PROPOFOL 10 MG/ML IV BOLUS
INTRAVENOUS | Status: AC
  Filled 2024-02-03: qty 20

## 2024-02-03 MED ORDER — OXYCODONE HCL 5 MG PO TABS
ORAL_TABLET | ORAL | Status: AC
  Filled 2024-02-03: qty 2

## 2024-02-03 MED ORDER — L-THEANINE 200 MG PO CAPS: 200.0000 mg | ORAL_CAPSULE | Freq: Every day | ORAL | Status: DC

## 2024-02-03 MED ORDER — PROPOFOL 500 MG/50ML IV EMUL
INTRAVENOUS | Status: DC | PRN
  Administered 2024-02-03: 100 ug/kg/min via INTRAVENOUS

## 2024-02-03 MED ORDER — FENTANYL CITRATE (PF) 250 MCG/5ML IJ SOLN
INTRAMUSCULAR | Status: AC
Start: 2024-02-03 — End: ?
  Filled 2024-02-03: qty 5

## 2024-02-03 MED ORDER — SODIUM CHLORIDE 0.9 % IR SOLN
Status: DC | PRN
  Administered 2024-02-03: 3000 mL

## 2024-02-03 MED ORDER — ACETAMINOPHEN 500 MG PO TABS
1000.0000 mg | ORAL_TABLET | Freq: Four times a day (QID) | ORAL | Status: DC
  Administered 2024-02-03 – 2024-02-04 (×2): 1000 mg via ORAL
  Filled 2024-02-03 (×3): qty 2

## 2024-02-03 MED ORDER — BUPIVACAINE IN DEXTROSE 0.75-8.25 % IT SOLN
INTRATHECAL | Status: DC | PRN
  Administered 2024-02-03: 1.6 mL via INTRATHECAL

## 2024-02-03 MED ORDER — ONDANSETRON HCL 4 MG/2ML IJ SOLN: 4.0000 mg | Freq: Once | INTRAMUSCULAR | Status: DC | PRN

## 2024-02-03 MED ORDER — CEFAZOLIN SODIUM-DEXTROSE 2-4 GM/100ML-% IV SOLN
2.0000 g | INTRAVENOUS | Status: AC
  Administered 2024-02-03: 2 g via INTRAVENOUS
  Filled 2024-02-03: qty 100

## 2024-02-03 MED ORDER — ALBUTEROL SULFATE (2.5 MG/3ML) 0.083% IN NEBU: 3.0000 mL | INHALATION_SOLUTION | Freq: Four times a day (QID) | RESPIRATORY_TRACT | Status: DC | PRN

## 2024-02-03 MED ORDER — HYDROCHLOROTHIAZIDE 12.5 MG PO TABS
12.5000 mg | ORAL_TABLET | Freq: Every day | ORAL | Status: DC
  Administered 2024-02-03 – 2024-02-04 (×2): 12.5 mg via ORAL
  Filled 2024-02-03 (×2): qty 1

## 2024-02-03 MED ORDER — OXYCODONE HCL 5 MG PO TABS
10.0000 mg | ORAL_TABLET | ORAL | Status: DC | PRN
  Administered 2024-02-03: 10 mg via ORAL
  Administered 2024-02-04 (×2): 15 mg via ORAL
  Filled 2024-02-03 (×2): qty 3
  Filled 2024-02-03: qty 2

## 2024-02-03 MED ORDER — BISACODYL 5 MG PO TBEC: 5.0000 mg | DELAYED_RELEASE_TABLET | Freq: Every day | ORAL | Status: DC | PRN

## 2024-02-03 MED ORDER — ONDANSETRON HCL 4 MG/2ML IJ SOLN: 4.0000 mg | Freq: Four times a day (QID) | INTRAMUSCULAR | Status: DC | PRN

## 2024-02-03 MED ORDER — MENTHOL 3 MG MT LOZG: 1.0000 | LOZENGE | OROMUCOSAL | Status: DC | PRN

## 2024-02-03 MED ORDER — LACTATED RINGERS IV SOLN: INTRAVENOUS | Status: DC

## 2024-02-03 MED ORDER — 0.9 % SODIUM CHLORIDE (POUR BTL) OPTIME
TOPICAL | Status: DC | PRN
  Administered 2024-02-03: 1000 mL

## 2024-02-03 MED ORDER — PHENYLEPHRINE HCL-NACL 20-0.9 MG/250ML-% IV SOLN
INTRAVENOUS | Status: DC | PRN
  Administered 2024-02-03 (×4): 80 ug via INTRAVENOUS

## 2024-02-03 MED ORDER — ONDANSETRON HCL 4 MG PO TABS: 4.0000 mg | ORAL_TABLET | Freq: Four times a day (QID) | ORAL | Status: DC | PRN

## 2024-02-03 MED ORDER — EPHEDRINE SULFATE-NACL 50-0.9 MG/10ML-% IV SOSY
PREFILLED_SYRINGE | INTRAVENOUS | Status: DC | PRN
  Administered 2024-02-03 (×2): 5 mg via INTRAVENOUS
  Administered 2024-02-03: 10 mg via INTRAVENOUS

## 2024-02-03 MED ORDER — TRANEXAMIC ACID 1000 MG/10ML IV SOLN
INTRAVENOUS | Status: DC | PRN
  Administered 2024-02-03: 2000 mg via TOPICAL

## 2024-02-03 MED ORDER — TRANEXAMIC ACID-NACL 1000-0.7 MG/100ML-% IV SOLN
1000.0000 mg | INTRAVENOUS | Status: AC
  Administered 2024-02-03: 1000 mg via INTRAVENOUS
  Filled 2024-02-03: qty 100

## 2024-02-03 MED ORDER — MILK THISTLE 1000 MG PO CAPS: 1000.0000 mg | ORAL_CAPSULE | Freq: Every day | ORAL | Status: DC

## 2024-02-03 MED ORDER — HYDROMORPHONE HCL 1 MG/ML IJ SOLN
0.5000 mg | INTRAMUSCULAR | Status: DC | PRN
  Administered 2024-02-03 – 2024-02-04 (×2): 1 mg via INTRAVENOUS
  Filled 2024-02-03 (×2): qty 1

## 2024-02-03 MED ORDER — IRBESARTAN 150 MG PO TABS
75.0000 mg | ORAL_TABLET | Freq: Every day | ORAL | Status: DC
  Administered 2024-02-03 – 2024-02-04 (×2): 75 mg via ORAL
  Filled 2024-02-03 (×2): qty 1

## 2024-02-03 MED ORDER — VALSARTAN-HYDROCHLOROTHIAZIDE 80-12.5 MG PO TABS: 1.0000 | ORAL_TABLET | Freq: Every day | ORAL | Status: DC

## 2024-02-03 MED ORDER — CHLORHEXIDINE GLUCONATE 0.12 % MT SOLN
15.0000 mL | Freq: Once | OROMUCOSAL | Status: AC
  Administered 2024-02-03: 15 mL via OROMUCOSAL
  Filled 2024-02-03: qty 15

## 2024-02-03 MED ORDER — CLONIDINE HCL (ANALGESIA) 100 MCG/ML EP SOLN
EPIDURAL | Status: DC | PRN
Start: 2024-02-03 — End: 2024-02-03
  Administered 2024-02-03: 50 ug

## 2024-02-03 MED ORDER — STERILE WATER FOR IRRIGATION IR SOLN
Status: DC | PRN
  Administered 2024-02-03: 1000 mL

## 2024-02-03 MED ORDER — LACTATED RINGERS IV SOLN: INTRAVENOUS | Status: DC | PRN

## 2024-02-03 MED ORDER — SODIUM CHLORIDE 0.9 % IV SOLN: INTRAVENOUS | Status: AC

## 2024-02-03 MED ORDER — ADULT MULTIVITAMIN W/MINERALS CH
ORAL_TABLET | Freq: Every day | ORAL | Status: DC
  Administered 2024-02-03 – 2024-02-04 (×2): 1 via ORAL
  Filled 2024-02-03 (×2): qty 1

## 2024-02-03 MED ORDER — ORAL CARE MOUTH RINSE: 15.0000 mL | Freq: Once | OROMUCOSAL | Status: AC

## 2024-02-03 MED ORDER — ROPIVACAINE HCL 5 MG/ML IJ SOLN
INTRAMUSCULAR | Status: DC | PRN
  Administered 2024-02-03: 20 mL via PERINEURAL

## 2024-02-03 MED ORDER — PANTOPRAZOLE SODIUM 40 MG PO TBEC
40.0000 mg | DELAYED_RELEASE_TABLET | Freq: Every day | ORAL | Status: DC
  Administered 2024-02-03 – 2024-02-04 (×2): 40 mg via ORAL
  Filled 2024-02-03 (×2): qty 1

## 2024-02-03 MED ORDER — ONDANSETRON HCL 4 MG/2ML IJ SOLN
INTRAMUSCULAR | Status: DC | PRN
  Administered 2024-02-03: 4 mg via INTRAVENOUS

## 2024-02-03 MED ORDER — PHENOL 1.4 % MT LIQD
1.0000 | OROMUCOSAL | Status: DC | PRN
Start: 2024-02-03 — End: 2024-02-04

## 2024-02-03 MED ORDER — DEXAMETHASONE SODIUM PHOSPHATE 10 MG/ML IJ SOLN
INTRAMUSCULAR | Status: DC | PRN
  Administered 2024-02-03: 5 mg via INTRAVENOUS

## 2024-02-03 MED ORDER — ROSUVASTATIN CALCIUM 5 MG PO TABS
10.0000 mg | ORAL_TABLET | Freq: Every day | ORAL | Status: DC
  Administered 2024-02-03 – 2024-02-04 (×2): 10 mg via ORAL
  Filled 2024-02-03 (×2): qty 2

## 2024-02-03 MED ORDER — ACETAMINOPHEN 325 MG PO TABS: 325.0000 mg | ORAL_TABLET | Freq: Four times a day (QID) | ORAL | Status: DC | PRN

## 2024-02-03 MED ORDER — VASHE WOUND IRRIGATION OPTIME
TOPICAL | Status: DC | PRN
  Administered 2024-02-03: 34 [oz_av]

## 2024-02-03 MED ORDER — MIRABEGRON ER 25 MG PO TB24
25.0000 mg | ORAL_TABLET | Freq: Every day | ORAL | Status: DC
  Administered 2024-02-04: 25 mg via ORAL
  Filled 2024-02-03: qty 1

## 2024-02-03 MED ORDER — DOCUSATE SODIUM 100 MG PO CAPS
100.0000 mg | ORAL_CAPSULE | Freq: Two times a day (BID) | ORAL | Status: DC
  Administered 2024-02-03 – 2024-02-04 (×3): 100 mg via ORAL
  Filled 2024-02-03 (×3): qty 1

## 2024-02-03 MED ORDER — METOCLOPRAMIDE HCL 5 MG PO TABS: 5.0000 mg | ORAL_TABLET | Freq: Three times a day (TID) | ORAL | Status: DC | PRN

## 2024-02-03 SURGICAL SUPPLY — 52 items
BAG COUNTER SPONGE SURGICOUNT (BAG) ×1 IMPLANT
BAG DECANTER FOR FLEXI CONT (MISCELLANEOUS) IMPLANT
BLADE SAGITTAL 25.0X1.19X90 (BLADE) ×1 IMPLANT
BLADE SAW SGTL 13X75X1.27 (BLADE) ×1 IMPLANT
BLADE SURG 21 STRL SS (BLADE) ×2 IMPLANT
BNDG COHESIVE 6X5 TAN NS LF (GAUZE/BANDAGES/DRESSINGS) IMPLANT
BNDG COHESIVE 6X5 TAN ST LF (GAUZE/BANDAGES/DRESSINGS) ×2 IMPLANT
BNDG GAUZE DERMACEA FLUFF 4 (GAUZE/BANDAGES/DRESSINGS) ×1 IMPLANT
BOWL SMART MIX CTS (DISPOSABLE) ×1 IMPLANT
CLEANSER WND VASHE 34 (WOUND CARE) IMPLANT
CNTNR URN SCR LID CUP LEK RST (MISCELLANEOUS) IMPLANT
COMP FEM TM KNEE STD 12 RT (Knees) IMPLANT
COMPONENT PATELLA 3 PEG 35 (Joint) IMPLANT
COOLER ICEMAN CLASSIC (MISCELLANEOUS) ×1 IMPLANT
COVER SURGICAL LIGHT HANDLE (MISCELLANEOUS) ×1 IMPLANT
CUFF TOURN SGL QUICK 42 (TOURNIQUET CUFF) IMPLANT
CUFF TRNQT CYL 34X4.125X (TOURNIQUET CUFF) ×1 IMPLANT
DRAPE EXTREMITY T 121X128X90 (DISPOSABLE) ×1 IMPLANT
DRAPE HALF SHEET 40X57 (DRAPES) ×2 IMPLANT
DRAPE U-SHAPE 47X51 STRL (DRAPES) ×1 IMPLANT
DRSG ADAPTIC 3X8 NADH LF (GAUZE/BANDAGES/DRESSINGS) ×1 IMPLANT
DRSG AQUACEL AG ADV 3.5X14 (GAUZE/BANDAGES/DRESSINGS) IMPLANT
DURAPREP 26ML APPLICATOR (WOUND CARE) ×1 IMPLANT
ELECTRODE REM PT RTRN 9FT ADLT (ELECTROSURGICAL) ×1 IMPLANT
FACESHIELD WRAPAROUND (MASK) IMPLANT
FACESHIELD WRAPAROUND OR TEAM (MASK) ×1 IMPLANT
GAUZE PAD ABD 8X10 STRL (GAUZE/BANDAGES/DRESSINGS) ×1 IMPLANT
GAUZE SPONGE 4X4 12PLY STRL (GAUZE/BANDAGES/DRESSINGS) ×1 IMPLANT
GLOVE BIOGEL PI IND STRL 9 (GLOVE) ×1 IMPLANT
GLOVE SURG ORTHO 9.0 STRL STRW (GLOVE) ×1 IMPLANT
GOWN STRL REUS W/ TWL XL LVL3 (GOWN DISPOSABLE) ×2 IMPLANT
INSERT TIB PS SZ G RT (Insert) IMPLANT
KIT BASIN OR (CUSTOM PROCEDURE TRAY) ×1 IMPLANT
KIT TURNOVER KIT B (KITS) ×1 IMPLANT
KNEE SYSTEM FEMUR SZ 12 RT (Knees) ×1 IMPLANT
KNEE SYSTEM MC 10 RT (Knees) IMPLANT
MANIFOLD NEPTUNE II (INSTRUMENTS) ×1 IMPLANT
NS IRRIG 1000ML POUR BTL (IV SOLUTION) ×1 IMPLANT
PACK TOTAL JOINT (CUSTOM PROCEDURE TRAY) ×1 IMPLANT
PAD ARMBOARD POSITIONER FOAM (MISCELLANEOUS) ×1 IMPLANT
PAD COLD SHLDR WRAP-ON (PAD) ×1 IMPLANT
PIN DRILL HDLS TROCAR 75 4PK (PIN) IMPLANT
SCREW FEMALE HEX FIX 25X2.5 (ORTHOPEDIC DISPOSABLE SUPPLIES) IMPLANT
SET HNDPC FAN SPRY TIP SCT (DISPOSABLE) ×1 IMPLANT
STAPLER SKIN PROX 35W (STAPLE) ×1 IMPLANT
SUCTION TUBE FRAZIER 10FR DISP (SUCTIONS) IMPLANT
SUT VIC AB 0 CT1 27XBRD ANBCTR (SUTURE) ×1 IMPLANT
SUT VIC AB 0 CT1 36 (SUTURE) IMPLANT
SUT VIC AB 1 CTX36XBRD ANBCTR (SUTURE) IMPLANT
SYR BULB IRRIG 60ML STRL (SYRINGE) IMPLANT
TOWEL GREEN STERILE (TOWEL DISPOSABLE) ×1 IMPLANT
TOWEL GREEN STERILE FF (TOWEL DISPOSABLE) ×1 IMPLANT

## 2024-02-03 NOTE — Anesthesia Postprocedure Evaluation (Signed)
 Anesthesia Post Note  Patient: Gregg Smith  Procedure(s) Performed: ARTHROPLASTY, KNEE, TOTAL (Right: Knee)     Patient location during evaluation: PACU Anesthesia Type: Spinal Level of consciousness: awake, awake and alert and oriented Pain management: pain level controlled Vital Signs Assessment: post-procedure vital signs reviewed and stable Respiratory status: spontaneous breathing, nonlabored ventilation and respiratory function stable Cardiovascular status: blood pressure returned to baseline, stable and bradycardic Postop Assessment: no headache, no backache, spinal receding and no apparent nausea or vomiting Anesthetic complications: no   No notable events documented.  Last Vitals:  Vitals:   02/03/24 1100 02/03/24 1115  BP: 121/89 132/82  Pulse: (!) 54 (!) 57  Resp: (!) 24 14  Temp: 36.4 C   SpO2: 93% 98%    Last Pain:  Vitals:   02/03/24 1115  TempSrc:   PainSc: 0-No pain                 Erin Havers

## 2024-02-03 NOTE — Plan of Care (Signed)

## 2024-02-03 NOTE — Transfer of Care (Signed)
 Immediate Anesthesia Transfer of Care Note  Patient: Gregg Smith  Procedure(s) Performed: ARTHROPLASTY, KNEE, TOTAL (Right: Knee)  Patient Location: PACU  Anesthesia Type:MAC, Regional, and Spinal  Level of Consciousness: drowsy and patient cooperative  Airway & Oxygen Therapy: Patient Spontanous Breathing  Post-op Assessment: Report given to RN and Post -op Vital signs reviewed and stable  Post vital signs: Reviewed and stable  Last Vitals:  Vitals Value Taken Time  BP 119/78 02/03/24 1012  Temp    Pulse 63 02/03/24 1013  Resp 17 02/03/24 1013  SpO2 93 % 02/03/24 1013  Vitals shown include unfiled device data.  Last Pain:  Vitals:   02/03/24 0706  TempSrc:   PainSc: 3       Patients Stated Pain Goal: 0 (02/03/24 0706)  Complications: No notable events documented.

## 2024-02-03 NOTE — Interval H&P Note (Signed)
 History and Physical Interval Note:  02/03/2024 6:53 AM  Gregg Smith  has presented today for surgery, with the diagnosis of Osteoarthritis Right knee.  The various methods of treatment have been discussed with the patient and family. After consideration of risks, benefits and other options for treatment, the patient has consented to  Procedure(s): ARTHROPLASTY, KNEE, TOTAL (Right) as a surgical intervention.  The patient's history has been reviewed, patient examined, no change in status, stable for surgery.  I have reviewed the patient's chart and labs.  Questions were answered to the patient's satisfaction.     Meng Winterton V Lyncoln Maskell

## 2024-02-03 NOTE — Evaluation (Signed)
 Physical Therapy Evaluation Patient Details Name: Gregg Smith MRN: [id number] DOB: 07-29-1946 Today's Date: 02/03/2024  History of Present Illness  Pt is a 78 y.o. male presenting to [facility] on 02/03/24 for elective R TKA. PMH is significant for OA of lumbar spine w/ myelopathy, lung disease, aortic atherosclerosis, scoliosis, GERD, HTN, and anxiety.  Clinical Impression  Prior to admittance, pt was independent with all mobility, not using an AD. Pt presents to evaluation with decreased mobility, decreased strength, impaired balance, decreased activity tolerance, and pain, all limiting patient's ability to safely mobilize near baseline. Pt was able to ambulate w/ AD and no physical assistance. Pt was educated on importance of icing following mobilization, and maintaining knee in extension when in bed or chair. Pt would benefit from further gait and stair training. Plan to give patient TKA exercise packet next session.       If plan is discharge home, recommend the following: A little help with walking and/or transfers;A little help with bathing/dressing/bathroom;Assist for transportation;Help with stairs or ramp for entrance   Can travel by private vehicle        Equipment Recommendations None recommended by PT  Recommendations for Other Services       Functional Status Assessment Patient has had a recent decline in their functional status and demonstrates the ability to make significant improvements in function in a reasonable and predictable amount of time.     Precautions / Restrictions Precautions Precautions: Fall Recall of Precautions/Restrictions: Intact Restrictions Weight Bearing Restrictions Per Provider Order: Yes RLE Weight Bearing Per Provider Order: Weight bearing as tolerated      Mobility  Bed Mobility Overal bed mobility: Needs Assistance Bed Mobility: Supine to Sit     Supine to sit: Supervision, HOB elevated, Used rails     General bed mobility comments:  sup > sit on L side of bed w/ HOB elevated and supervision.    Transfers Overall transfer level: Needs assistance Equipment used: Rolling walker (2 wheels) Transfers: Sit to/from Stand Sit to Stand: Contact guard assist           General transfer comment: Pt completed STS from EOB w/ RW and CGA. VC given for upper and lower extremity placement, and sequencing; increased time to complete.    Ambulation/Gait Ambulation/Gait assistance: Contact guard assist Gait Distance (Feet): 80 Feet Assistive device: Rolling walker (2 wheels) Gait Pattern/deviations: Step-to pattern, Decreased stride length, Decreased step length - left, Decreased stance time - left, Decreased weight shift to right, Knee flexed in stance - right Gait velocity: reduced Gait velocity interpretation: <1.8 ft/sec, indicate of risk for recurrent falls   General Gait Details: Pt ambulates with decreased cadence, decreased step length, decreased knee extension during weight acceptance, and demonstrates a hop-to gait pattern, with significant knee flexion throughout swing phase.  Stairs            Wheelchair Mobility     Tilt Bed    Modified Rankin (Stroke Patients Only)       Balance Overall balance assessment: Needs assistance Sitting-balance support: No upper extremity supported, Feet supported Sitting balance-Leahy Scale: Good Sitting balance - Comments: pt able to sit EOB w/out BUE support   Standing balance support: Bilateral upper extremity supported, During functional activity, Reliant on assistive device for balance Standing balance-Leahy Scale: Poor Standing balance comment: pt reliant on external device for maintaining upright  Pertinent Vitals/Pain Pain Assessment Pain Assessment: 0-10 Pain Score: 6  Pain Location: R knee Pain Descriptors / Indicators: Discomfort, Grimacing Pain Intervention(s): Limited activity within patient's tolerance,  Monitored during session, Patient requesting pain meds-RN notified    Home Living Family/patient expects to be discharged to:: Private residence Living Arrangements: Alone Available Help at Discharge: Family;Available 24 hours/day (son is coming to live with him at discharge) Type of Home: House Home Access: Stairs to enter Entrance Stairs-Rails: None Entrance Stairs-Number of Steps: 4 Alternate Level Stairs-Number of Steps: 12 Home Layout: Two level Home Equipment: Agricultural consultant (2 wheels);Cane - quad;Crutches;BSC/3in1;Toilet riser      Prior Function Prior Level of Function : Independent/Modified Independent;Working/employed;Driving             Mobility Comments: Pt reports independent for all mobilization not utilizing an AD ADLs Comments: Pt reports independent with all ADLs     Extremity/Trunk Assessment   Upper Extremity Assessment Upper Extremity Assessment: Overall WFL for tasks assessed    Lower Extremity Assessment Lower Extremity Assessment: RLE deficits/detail;Generalized weakness    Cervical / Trunk Assessment Cervical / Trunk Assessment: Kyphotic  Communication   Communication Communication: No apparent difficulties    Cognition Arousal: Alert Behavior During Therapy: Anxious   PT - Cognitive impairments: Memory                       PT - Cognition Comments: pt having difficult recalling recent information Following commands: Intact       Cueing Cueing Techniques: Verbal cues, Visual cues     General Comments General comments (skin integrity, edema, etc.): no signs of acute distress. pt reports mild dizziness upon sitting EOB but symptoms subsided with increased time.    Exercises     Assessment/Plan    PT Assessment Patient needs continued PT services  PT Problem List Decreased strength;Decreased range of motion;Decreased activity tolerance;Decreased balance;Decreased mobility;Decreased coordination;Decreased knowledge of use  of DME;Pain       PT Treatment Interventions Gait training;DME instruction;Stair training;Functional mobility training;Therapeutic activities;Therapeutic exercise;Balance training;Neuromuscular re-education;Patient/family education;Manual techniques    PT Goals (Current goals can be found in the Care Plan section)  Acute Rehab PT Goals Patient Stated Goal: to go home PT Goal Formulation: With patient Time For Goal Achievement: 02/07/24 Potential to Achieve Goals: Good    Frequency 7X/week     Co-evaluation               AM-PAC PT "6 Clicks" Mobility  Outcome Measure Help needed turning from your back to your side while in a flat bed without using bedrails?: None Help needed moving from lying on your back to sitting on the side of a flat bed without using bedrails?: None Help needed moving to and from a bed to a chair (including a wheelchair)?: A Little Help needed standing up from a chair using your arms (e.g., wheelchair or bedside chair)?: A Little Help needed to walk in hospital room?: A Little Help needed climbing 3-5 steps with a railing? : A Lot 6 Click Score: 19    End of Session Equipment Utilized During Treatment: Gait belt Activity Tolerance: Patient tolerated treatment well Patient left: in chair;with call bell/phone within reach;with chair alarm set;with nursing/sitter in room Nurse Communication: Mobility status;Patient requests pain meds PT Visit Diagnosis: Unsteadiness on feet (R26.81);Muscle weakness (generalized) (M62.81);Pain Pain - Right/Left: Right Pain - part of body: Knee    Time: 1610-9604 PT Time Calculation (min) (ACUTE ONLY): 27 min  Charges:   PT Evaluation $PT Eval Low Complexity: 1 Low   PT General Charges $$ ACUTE PT VISIT: 1 Visit         Lonell Rives, SPT Acute Rehab [phone]   Lonell Rives 02/03/2024, 4:38 PM

## 2024-02-03 NOTE — Anesthesia Procedure Notes (Signed)
 Spinal  Patient location during procedure: OR Start time: 02/03/2024 8:32 AM End time: 02/03/2024 8:35 AM Reason for block: surgical anesthesia Staffing Performed: anesthesiologist  Anesthesiologist: Erin Havers, MD Performed by: Erin Havers, MD Authorized by: Erin Havers, MD   Preanesthetic Checklist Completed: patient identified, IV checked, risks and benefits discussed, surgical consent, monitors and equipment checked, pre-op evaluation and timeout performed Spinal Block Patient position: sitting Prep: DuraPrep and site prepped and draped Patient monitoring: continuous pulse ox and blood pressure Approach: midline Location: L3-4 Injection technique: single-shot Needle Needle type: Pencan  Needle gauge: 24 G Assessment Events: CSF return Additional Notes Functioning IV was confirmed and monitors were applied. Sterile prep and drape, including hand hygiene, mask and sterile gloves were used. The patient was positioned and the spine was prepped. The skin was anesthetized with lidocaine .  Free flow of clear CSF was obtained prior to injecting local anesthetic into the CSF.  The spinal needle aspirated freely following injection.  The needle was carefully withdrawn.  The patient tolerated the procedure well. Consent was obtained prior to procedure with all questions answered and concerns addressed. Risks including but not limited to bleeding, infection, nerve damage, paralysis, failed block, inadequate analgesia, allergic reaction, high spinal, itching and headache were discussed and the patient wished to proceed.   Gwenevere Lent, MD

## 2024-02-03 NOTE — Op Note (Signed)
 DATE OF SURGERY:  02/03/2024  TIME: 10:07 AM  PATIENT NAME:  Gregg Smith    AGE: 78 y.o.    PRE-OPERATIVE DIAGNOSIS:  Osteoarthritis Right knee  POST-OPERATIVE DIAGNOSIS:  Osteoarthritis Right knee  PROCEDURE:  Procedure(s): ARTHROPLASTY, KNEE, TOTAL  SURGEON: Gearldean Keepers  ASSISTANT: Adelfa Homes  OPERATIVE IMPLANTS: Zimmer Persona  Femur size 12, Tibia size G 2- Peg fixed bearing, Patella size 35 1-peg oval button, with a 10 mm polyethylene insert medial congruent.  @ENCIMAGES @  Implant Name Type Inv. Item Serial No. Manufacturer Lot No. LRB No. Used Action  COMPONENT PATELLA 3 PEG 35 - [id number] Joint COMPONENT PATELLA 3 PEG 35  [employer]IMMER [employer]ON(ORTH,TRAU,BIO,SG) [id number] Right 1 Implanted  INSERT TIB PS SZ G RT - [id number] Insert INSERT TIB PS SZ G RT  ZIMMER [employer]ON(ORTH,TRAU,BIO,SG) [id number] Right 1 Implanted  KNEE SYSTEM FEMUR SZ 12 RT - [id number] Knees KNEE SYSTEM FEMUR SZ 12 RT  ZIMMER [employer]ON(ORTH,TRAU,BIO,SG) [id number] Right 1 Implanted  KNEE SYSTEM MC 10 RT - [id number] Knees KNEE SYSTEM MC 10 RT  ZIMMER RECON(ORTH,TRAU,BIO,SG) [id number] Right 1 Implanted      PREOPERATIVE INDICATIONS:   Gregg Smith is a 78 y.o. year old male with end stage degenerative arthritis of the knee who failed conservative treatment and elected for Total Knee Arthroplasty.   The risks, benefits, and alternatives were discussed at length including but not limited to the risks of infection, bleeding, nerve injury, stiffness, blood clots, the need for revision surgery, cardiopulmonary complications, among others, and they were willing to proceed.  OPERATIVE DESCRIPTION:  The patient was brought to the operative room and placed in a supine position.  Anesthesia was administered.  IV antibiotics were given.  IV TXA was initiated.  The lower extremity was prepped and draped in the usual sterile fashion.  Ioban was used to cover all exposed skin. Time out was performed.    Anterior  quadriceps tendon splitting approach was performed.  The patella was everted and osteophytes were removed.  The anterior horn of the medial and lateral meniscus was removed.   The distal femur was opened with the drill and the intramedullary distal femoral cutting jig was utilized, set at 5 degrees valgus resecting 9 mm off the distal femur.  Care was taken to protect the collateral ligaments.  Then the extramedullary tibial cutting jig was utilized set for 3 degree posterior slope.  Care was taken during the cut to protect the medial and collateral ligaments.  The proximal tibia was removed along with the posterior horns of the menisci.  The PCL was sacrificed.    The extensor gap was measured and was approximately 10 mm.    The distal femoral sizing jig was applied, taking care to avoid notching.  Then the 4-in-1 cutting jig was applied and the anterior and posterior femur was cut, along with the chamfer cuts.  All posterior osteophytes were removed.  The flexion gap was then measured and was symmetric with the extension gap.  The distal femoral preparation using the appropriate jig to prepare the box.  The patella was then measured, and cut with the saw.    The proximal tibia sized and prepared accordingly with the reamer and the punch, and then all components were trialed with the poly insert.  The knee was found to have stable balance and full motion.  The knee was irrigated with normal saline, topical 2 g TXA was used to soak the wound.  The above named components were then  press fit into place.  The final polyethylene component was placed.  The knee was then taken through a range of motion and the patella tracked well and the knee irrigated copiously and the parapatellar and subcutaneous tissue closed with vicryl, and skin closed with staples..  A sterile dressing was applied and patient  was taken to the PACU in stable  condition.  There were no complications.  Total tourniquet time was  30 minutes.

## 2024-02-03 NOTE — Progress Notes (Signed)
   02/03/24 1521  TOC Brief Assessment  Insurance and Status Reviewed ([employer] PPO)  Patient has primary care physician Yes (Sagardia, Isidro Margo, MD)  Home environment has been reviewed Lives at home with his Son  Prior level of function: Independent  Prior/Current Home Services No current home services  Social Drivers of Health Review SDOH reviewed no interventions necessary  Readmission risk has been reviewed Yes (N/A listed)  Transition of care needs no transition of care needs at this time (Home Health arranged preoperatively)   [facility] will provide HHPT and patient has a rolling walker. No additional TOC needs anticipated.  Please place [facility] consult if a need arises

## 2024-02-03 NOTE — Anesthesia Procedure Notes (Signed)
 Anesthesia Regional Block: Adductor canal block   Pre-Anesthetic Checklist: , timeout performed,  Correct Patient, Correct Site, Correct Laterality,  Correct Procedure, Correct Position, site marked,  Risks and benefits discussed,  Surgical consent,  Pre-op evaluation,  At surgeon's request and post-op pain management  Laterality: Right  Prep: chloraprep       Needles:  Injection technique: Single-shot  Needle Type: Echogenic Needle     Needle Length: 9cm  Needle Gauge: 21     Additional Needles:   Procedures:,,,, ultrasound used (permanent image in chart),,    Narrative:  Start time: 02/03/2024 7:45 AM End time: 02/03/2024 7:55 AM Injection made incrementally with aspirations every 5 mL.  Performed by: Personally  Anesthesiologist: Erin Havers, MD  Additional Notes: No pain on injection. No increased resistance to injection. Injection made in 5cc increments.  Good needle visualization.  Patient tolerated procedure well.

## 2024-02-04 ENCOUNTER — Encounter (HOSPITAL_COMMUNITY): Payer: Self-pay | Admitting: Orthopedic Surgery

## 2024-02-04 DIAGNOSIS — K219 Gastro-esophageal reflux disease without esophagitis: Secondary | ICD-10-CM | POA: Diagnosis not present

## 2024-02-04 DIAGNOSIS — Z6834 Body mass index (BMI) 34.0-34.9, adult: Secondary | ICD-10-CM | POA: Diagnosis not present

## 2024-02-04 DIAGNOSIS — E669 Obesity, unspecified: Secondary | ICD-10-CM | POA: Diagnosis not present

## 2024-02-04 DIAGNOSIS — F419 Anxiety disorder, unspecified: Secondary | ICD-10-CM | POA: Diagnosis not present

## 2024-02-04 DIAGNOSIS — M1711 Unilateral primary osteoarthritis, right knee: Secondary | ICD-10-CM | POA: Diagnosis not present

## 2024-02-04 DIAGNOSIS — J432 Centrilobular emphysema: Secondary | ICD-10-CM | POA: Diagnosis not present

## 2024-02-04 DIAGNOSIS — Z87891 Personal history of nicotine dependence: Secondary | ICD-10-CM | POA: Diagnosis not present

## 2024-02-04 DIAGNOSIS — I10 Essential (primary) hypertension: Secondary | ICD-10-CM | POA: Diagnosis not present

## 2024-02-04 DIAGNOSIS — G4733 Obstructive sleep apnea (adult) (pediatric): Secondary | ICD-10-CM | POA: Diagnosis not present

## 2024-02-04 MED ORDER — OXYCODONE-ACETAMINOPHEN 5-325 MG PO TABS: 1.0000 | ORAL_TABLET | ORAL | 0 refills | Status: DC | PRN

## 2024-02-04 NOTE — Discharge Instructions (Signed)

## 2024-02-04 NOTE — Progress Notes (Signed)
 Physical Therapy Treatment Patient Details Name: Gregg Smith MRN: [id number] DOB: 1945/10/23 Today's Date: 02/04/2024   History of Present Illness Pt is a 78 y.o. male presenting to [facility] on 02/03/24 for elective R TKA. PMH is significant for OA of lumbar spine w/ myelopathy, lung disease, aortic atherosclerosis, scoliosis, GERD, HTN, and anxiety.    PT Comments  Pt received in supine and agreeable to session. Pt's dressing noted to be soaked through and pt reporting increased R knee pain, RN notified and giving pain meds during session. Pt instructed in TKR HEP and is able to demo with cues for technique. Pt able to stand and tolerate a short gait trial. Pt requests to use the bathroom and is able to void in standing with CGA for safety. Pt demonstrates 1 mild LOB in the bathroom requiring min A to correct. Pt demonstrates improved stability when standing with BUE support on chair arms. Plan for stair trial this afternoon before discharge. Pt continues to benefit from PT services to progress toward functional mobility goals.    If plan is discharge home, recommend the following: A little help with walking and/or transfers;A little help with bathing/dressing/bathroom;Assist for transportation;Help with stairs or ramp for entrance   Can travel by private vehicle        Equipment Recommendations  None recommended by PT    Recommendations for Other Services       Precautions / Restrictions Precautions Precautions: Fall Recall of Precautions/Restrictions: Intact Restrictions Weight Bearing Restrictions Per Provider Order: Yes RLE Weight Bearing Per Provider Order: Weight bearing as tolerated     Mobility  Bed Mobility Overal bed mobility: Needs Assistance Bed Mobility: Supine to Sit, Sit to Supine     Supine to sit: Supervision, HOB elevated, Used rails Sit to supine: Contact guard assist, HOB elevated   General bed mobility comments: increased time and cues. Pt instructed in use  of gait belt for elevating RLE back to EOB with pt able to demo without physical assist    Transfers Overall transfer level: Needs assistance Equipment used: Rolling walker (2 wheels) Transfers: Sit to/from Stand Sit to Stand: Min assist, Contact guard assist           General transfer comment: initially min A to stand from EOB progressing to CGA from recliner with BUE support on chair arms for power up. Cues for RLE positioning    Ambulation/Gait Ambulation/Gait assistance: Contact guard assist Gait Distance (Feet): 60 Feet Assistive device: Rolling walker (2 wheels) Gait Pattern/deviations: Step-to pattern, Decreased stride length, Decreased step length - left, Decreased stance time - left, Decreased weight shift to right, Knee flexed in stance - right, Trunk flexed, Antalgic Gait velocity: reduced     General Gait Details: Pt demonstrates step-to pattern with limited RLE WB tolerance. Cues for RLE flat foot due to pt toe WB and instruction to work towards heel-toe pattern. Pt unable to achieve R knee extension in stance despite cues, but no buckling noted   Stairs             Wheelchair Mobility     Tilt Bed    Modified Rankin (Stroke Patients Only)       Balance Overall balance assessment: Needs assistance Sitting-balance support: No upper extremity supported, Feet supported Sitting balance-Leahy Scale: Good     Standing balance support: Bilateral upper extremity supported, During functional activity, Reliant on assistive device for balance Standing balance-Leahy Scale: Poor Standing balance comment: reliant on RW support  Communication Communication Communication: No apparent difficulties  Cognition Arousal: Alert Behavior During Therapy: Anxious   PT - Cognitive impairments: Memory                         Following commands: Intact      Cueing Cueing Techniques: Verbal cues, Visual cues   Exercises Total Joint Exercises Ankle Circles/Pumps: AROM, Supine, Both, 5 reps Quad Sets: AROM, Supine, Right, 5 reps Heel Slides: AROM, Supine, Right, 5 reps Hip ABduction/ADduction: AROM, Supine, Right (2 reps) Straight Leg Raises: AROM, Supine, Right (2 reps) Long Arc Quad: AROM, Seated, Right, 5 reps Knee Flexion: AROM, Seated, Right, 5 reps Other Exercises Other Exercises: x5 serial STS    General Comments General comments (skin integrity, edema, etc.): Pt's dressing noted to be soaked through with blood, RN notified.      Pertinent Vitals/Pain Pain Assessment Pain Assessment: Faces Faces Pain Scale: Hurts even more Pain Location: R knee Pain Descriptors / Indicators: Discomfort, Grimacing Pain Intervention(s): Limited activity within patient's tolerance, Monitored during session, Repositioned, RN gave pain meds during session     PT Goals (current goals can now be found in the care plan section) Acute Rehab PT Goals Patient Stated Goal: to go home PT Goal Formulation: With patient Time For Goal Achievement: 02/07/24 Progress towards PT goals: Progressing toward goals    Frequency    7X/week       AM-PAC PT "6 Clicks" Mobility   Outcome Measure  Help needed turning from your back to your side while in a flat bed without using bedrails?: None Help needed moving from lying on your back to sitting on the side of a flat bed without using bedrails?: None Help needed moving to and from a bed to a chair (including a wheelchair)?: A Little Help needed standing up from a chair using your arms (e.g., wheelchair or bedside chair)?: A Little Help needed to walk in hospital room?: A Little Help needed climbing 3-5 steps with a railing? : A Lot 6 Click Score: 19    End of Session Equipment Utilized During Treatment: Gait belt Activity Tolerance: Patient tolerated treatment well;Patient limited by pain Patient left: with call bell/phone within reach;with nursing/sitter  in room;in bed Nurse Communication: Mobility status PT Visit Diagnosis: Unsteadiness on feet (R26.81);Muscle weakness (generalized) (M62.81);Pain     Time: 0454-0981 PT Time Calculation (min) (ACUTE ONLY): 45 min  Charges:    $Gait Training: 8-22 mins $Therapeutic Exercise: 8-22 mins $Therapeutic Activity: 8-22 mins PT General Charges $$ ACUTE PT VISIT: 1 Visit                     Michaelle Adolphus, PTA Acute Rehabilitation Services Secure Chat Preferred  [phone]    Michaelle Adolphus 02/04/2024, 10:58 AM

## 2024-02-04 NOTE — Progress Notes (Signed)
 Physical Therapy Treatment Patient Details Name: Gregg Smith MRN: [id number] DOB: 07-13-1946 Today's Date: 02/04/2024   History of Present Illness Pt is a 78 y.o. male presenting to [facility] on 02/03/24 for elective R TKA. PMH is significant for OA of lumbar spine w/ myelopathy, lung disease, aortic atherosclerosis, scoliosis, GERD, HTN, and anxiety.    PT Comments  Pt received in supine with RN and son present. Pt's bandage noted to be soaked through with blood and RN reports recently changing the dressing and pt reports feeling "weak", so deferred OOB mobility this session.  Education provided to pt and pt's son on stair and guarding techniques, safe home navigation, how to use the iceman machine, and resting RLE in extension. Answered all questions as able and pt and pt's son verbalizing understanding. Pt continues to benefit from PT services to progress toward functional mobility goals.    If plan is discharge home, recommend the following: A little help with walking and/or transfers;A little help with bathing/dressing/bathroom;Assist for transportation;Help with stairs or ramp for entrance   Can travel by private vehicle        Equipment Recommendations  None recommended by PT    Recommendations for Other Services       Precautions / Restrictions Precautions Precautions: Fall Recall of Precautions/Restrictions: Intact Restrictions Weight Bearing Restrictions Per Provider Order: Yes RLE Weight Bearing Per Provider Order: Weight bearing as tolerated     Mobility  Bed Mobility Overal bed mobility: Needs Assistance Bed Mobility: Supine to Sit, Sit to Supine     Supine to sit: Supervision, HOB elevated, Used rails Sit to supine: Contact guard assist, HOB elevated   General bed mobility comments: Pt able to come to longsitting in flat bed without assist. Deferred EOB/OOB mobility due to RLE bleeding    Transfers Overall transfer level: Needs assistance Equipment used:  Rolling walker (2 wheels) Transfers: Sit to/from Stand Sit to Stand: Min assist, Contact guard assist           General transfer comment: initially min A to stand from EOB progressing to CGA from recliner with BUE support on chair arms for power up. Cues for RLE positioning    Ambulation/Gait Ambulation/Gait assistance: Contact guard assist Gait Distance (Feet): 60 Feet Assistive device: Rolling walker (2 wheels) Gait Pattern/deviations: Step-to pattern, Decreased stride length, Decreased step length - left, Decreased stance time - left, Decreased weight shift to right, Knee flexed in stance - right, Trunk flexed, Antalgic Gait velocity: reduced     General Gait Details: Pt demonstrates step-to pattern with limited RLE WB tolerance. Cues for RLE flat foot due to pt toe WB and instruction to work towards heel-toe pattern. Pt unable to achieve R knee extension in stance despite cues, but no buckling noted   Stairs Stairs:  (Discussed technique and guarding technique with pt and pt's son. Both repeated and verbalized understanding)           Wheelchair Mobility     Tilt Bed    Modified Rankin (Stroke Patients Only)       Balance Overall balance assessment: Needs assistance Sitting-balance support: No upper extremity supported, Feet supported Sitting balance-Leahy Scale: Good     Standing balance support: Bilateral upper extremity supported, During functional activity, Reliant on assistive device for balance Standing balance-Leahy Scale: Poor Standing balance comment: reliant on RW support  Communication Communication Communication: No apparent difficulties  Cognition Arousal: Alert Behavior During Therapy: Anxious   PT - Cognitive impairments: Memory                         Following commands: Intact      Cueing Cueing Techniques: Verbal cues, Visual cues  Exercises Total Joint Exercises Ankle Circles/Pumps:  AROM, Supine, Both, 5 reps Quad Sets: AROM, Supine, Right, 5 reps Heel Slides: AROM, Supine, Right, 5 reps Hip ABduction/ADduction: AROM, Supine, Right (2 reps) Straight Leg Raises: AROM, Supine, Right (2 reps) Long Arc Quad: AROM, Seated, Right, 5 reps Knee Flexion: AROM, Seated, Right, 5 reps Other Exercises Other Exercises: x5 serial STS    General Comments General comments (skin integrity, edema, etc.): Pt's dressing noted to be soaked through with blood. RN present and requesting to defer mobilty at this time to prevent further bleeding. Session focused on education with pt and pt's son.      Pertinent Vitals/Pain Pain Assessment Pain Assessment: Faces Faces Pain Scale: Hurts little more Pain Location: R knee Pain Descriptors / Indicators: Discomfort, Grimacing Pain Intervention(s): Limited activity within patient's tolerance, Monitored during session     PT Goals (current goals can now be found in the care plan section) Acute Rehab PT Goals Patient Stated Goal: to go home PT Goal Formulation: With patient Time For Goal Achievement: 02/07/24 Progress towards PT goals: Progressing toward goals    Frequency    7X/week       AM-PAC PT "6 Clicks" Mobility   Outcome Measure  Help needed turning from your back to your side while in a flat bed without using bedrails?: None Help needed moving from lying on your back to sitting on the side of a flat bed without using bedrails?: None Help needed moving to and from a bed to a chair (including a wheelchair)?: A Little Help needed standing up from a chair using your arms (e.g., wheelchair or bedside chair)?: A Little Help needed to walk in hospital room?: A Little Help needed climbing 3-5 steps with a railing? : A Lot 6 Click Score: 19    End of Session   Activity Tolerance: Treatment limited secondary to medical complications (Comment) (Limited mobility due to bleeding at R knee) Patient left: with call bell/phone within  reach;in bed;with family/visitor present Nurse Communication: Mobility status PT Visit Diagnosis: Unsteadiness on feet (R26.81);Muscle weakness (generalized) (M62.81);Pain     Time: 1519-1540 PT Time Calculation (min) (ACUTE ONLY): 21 min  Charges:    $Self Care/Home Management: 8-22 PT General Charges $$ ACUTE PT VISIT: 1 Visit                     Michaelle Adolphus, PTA Acute Rehabilitation Services Secure Chat Preferred  [phone]    Michaelle Adolphus 02/04/2024, 3:54 PM

## 2024-02-04 NOTE — TOC Transition Note (Signed)
 Transition of Care [facility]) - Discharge Note   Patient Details  Name: Gregg Smith MRN: [id number] Date of Birth: [birth year]  [facility] of [facility]) CM/SW Contact:  Eusebio High, RN Phone Number: 02/04/2024, 8:56 AM   Clinical Narrative:     Patient will discharge to home today with Son. [facility] will provide services as arranged preoperatively. No DME needed  No additional TOC needs           Patient Goals and CMS Choice            Discharge Placement                       Discharge Plan and Services Additional resources added to the After Visit Summary for                                       Social Drivers of Health (SDOH) Interventions SDOH Screenings   Food Insecurity: No Food Insecurity (09/20/2023)  Housing: Low Risk  (09/20/2023)  Transportation Needs: No Transportation Needs (09/20/2023)  Utilities: Not At Risk (04/05/2023)  Alcohol Screen: Low Risk  (09/20/2023)  Depression (PHQ2-9): Low Risk  (11/03/2023)  Financial Resource Strain: Low Risk  (09/20/2023)  Physical Activity: Sufficiently Active (09/20/2023)  Social Connections: Moderately Integrated (09/20/2023)  Stress: No Stress Concern Present (09/20/2023)  Tobacco Use: Medium Risk (02/03/2024)  Health Literacy: Adequate Health Literacy (04/09/2023)     Readmission Risk Interventions     No data to display

## 2024-02-04 NOTE — Progress Notes (Signed)
 Patient concerned about amount of bleeding from surgical site. Nurse reached out to Julio Ohm, MD and advised to instruct patient to stop Asprin, clean site, change dressing daily, and elevate leg.   Abraham Margulies, RN

## 2024-02-04 NOTE — Progress Notes (Signed)
 Patient ID: Gregg Smith, male   DOB: 04-27-1946, 78 y.o.   MRN: [id number] Patient is seen in follow-up status post total knee arthroplasty.  He has been able to ambulate well yesterday.  Plan for discharge to home today.  Patient is to take his ice machine.  He will need DME.

## 2024-02-04 NOTE — Progress Notes (Signed)
 IV has been removed per d/c order, tolerated well. Dressing change has been performed due to excess drainage.

## 2024-02-04 NOTE — Discharge Summary (Signed)
 Physician Discharge Summary  Patient ID: Gregg Smith MRN: [id number] DOB/AGE: 78-Jun-1947 78 y.o.  Admit date: 02/03/2024 Discharge date: 02/04/2024  Admission Diagnoses:  Principal Problem:   Osteoarthritis (arthritis due to wear and tear of joints) Active Problems:   Unilateral primary osteoarthritis, right knee   Discharge Diagnoses:  Same  Past Medical History:  Diagnosis Date   Allergy    seasonal    Anxiety    Arthritis    Barrett's esophagus    Cataract    Chicken pox    Colon polyps    hyperplastic   Diverticulosis    Emphysema lung (HCC)    GERD (gastroesophageal reflux disease)    Hemorrhoids    Hyperlipidemia    Hypertension    ILD (interstitial lung disease) (HCC)    12/18/23 Mild basilar fibrotic interstitial lung disease, 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   Insomnia    Leg cramps    Measles    Mumps    Obstructive sleep apnea    Pneumonia    Sleep apnea    dental device; no longer uses    Surgeries: Procedure(s): ARTHROPLASTY, KNEE, TOTAL on 02/03/2024   Consultants:   Discharged Condition: Improved  Hospital Course: Gregg Smith is an 78 y.o. male who was admitted 02/03/2024 with a chief complaint of No chief complaint on file. , and found to have a diagnosis of Osteoarthritis (arthritis due to wear and tear of joints).  They were brought to the operating room on 02/03/2024 and underwent the above named procedures.    They were given perioperative antibiotics:  Anti-infectives (From admission, onward)    Start     Dose/Rate Route Frequency Ordered Stop   02/03/24 0700  ceFAZolin (ANCEF) IVPB 2g/100 mL premix        2 g 200 mL/hr over 30 Minutes Intravenous On call to O.R. 02/03/24 1884 02/03/24 0906     .  They were given compression stockings, early ambulation, and chemoprophylaxis for DVT prophylaxis.  They benefited maximally from their hospital stay and there were no complications.    Recent vital signs:  Vitals:    02/04/24 0451 02/04/24 0755  BP: (!) 138/90 125/71  Pulse: 71 69  Resp: 18 20  Temp: 98 F (36.7 C) 98.1 F (36.7 C)  SpO2: 97% 96%    Recent laboratory studies:  Results for orders placed or performed during the hospital encounter of 01/21/24  Surgical pcr screen   Collection Time: 01/21/24  9:02 AM   Specimen: Nasal Mucosa; Nasal Swab  Result Value Ref Range   MRSA, PCR NEGATIVE NEGATIVE   Staphylococcus aureus NEGATIVE NEGATIVE  Basic metabolic panel per protocol   Collection Time: 01/21/24  9:02 AM  Result Value Ref Range   Sodium 140 135 - 145 mmol/L   Potassium 3.9 3.5 - 5.1 mmol/L   Chloride 107 98 - 111 mmol/L   CO2 25 22 - 32 mmol/L   Glucose, Bld 95 70 - 99 mg/dL   BUN 23 8 - 23 mg/dL   Creatinine, Ser 1.66 0.61 - 1.24 mg/dL   Calcium  9.1 8.9 - 10.3 mg/dL   GFR, Estimated >06 >30 mL/min   Anion gap 8 5 - 15  CBC per protocol   Collection Time: 01/21/24  9:02 AM  Result Value Ref Range   WBC 6.1 4.0 - 10.5 K/uL   RBC 4.99 4.22 - 5.81 MIL/uL   Hemoglobin 14.5 13.0 - 17.0 g/dL   HCT 16.0 10.9 -  52.0 %   MCV 87.8 80.0 - 100.0 fL   MCH 29.1 26.0 - 34.0 pg   MCHC 33.1 30.0 - 36.0 g/dL   RDW 16.1 09.6 - 04.5 %   Platelets 174 150 - 400 K/uL   nRBC 0.0 0.0 - 0.2 %    Discharge Medications:   Allergies as of 02/04/2024       Reactions   Penicillins Hives, Rash   Has patient had a PCN reaction causing immediate rash, facial/tongue/throat swelling, SOB or lightheadedness with hypotension:unsure Has patient had a PCN reaction causing severe rash involving mucus membranes or skin necrosis:unsure Has patient had a PCN reaction that required hospitalization:No Has patient had a PCN reaction occurring within the last 10 years:No If all of the above answers are "NO", then may proceed with Cephalosporin use. Childhood reaction        Medication List     TAKE these medications    albuterol  108 (90 Base) MCG/ACT inhaler Commonly known as: VENTOLIN  HFA Inhale  2 puffs into the lungs every 6 (six) hours as needed for wheezing or shortness of breath.   eszopiclone 2 MG Tabs tablet Commonly known as: LUNESTA Take 1 mg by mouth at bedtime.   Gemtesa  75 MG Tabs Generic drug: Vibegron  TAKE 75 MG BY MOUTH DAILY.   L-Theanine 200 MG Caps Take 200 mg by mouth daily.   lansoprazole  30 MG capsule Commonly known as: PREVACID  TAKE 1 CAPSULE (30 MG TOTAL) BY MOUTH 2 (TWO) TIMES DAILY BEFORE A MEAL.   Milk Thistle 1000 MG Caps Take 1,000 mg by mouth daily.   multivitamin tablet Take 1 tablet by mouth daily.   OVER THE COUNTER MEDICATION Take 1 tablet by mouth daily. Calm Aid   oxyCODONE-acetaminophen  5-325 MG tablet Commonly known as: PERCOCET/ROXICET Take 1 tablet by mouth every 4 (four) hours as needed.   rosuvastatin  10 MG tablet Commonly known as: CRESTOR  TAKE 1 TABLET BY MOUTH EVERY DAY   tamsulosin  0.4 MG Caps capsule Commonly known as: FLOMAX  TAKE 1 CAPSULE BY MOUTH DAILY AT NIGHT.   Trelegy Ellipta  100-62.5-25 MCG/ACT Aepb Generic drug: Fluticasone -Umeclidin-Vilant Inhale 1 puff into the lungs daily.   valsartan -hydrochlorothiazide  80-12.5 MG tablet Commonly known as: DIOVAN -HCT TAKE 1 TABLET BY MOUTH EVERY DAY        Diagnostic Studies: XR Lumbar Spine Complete Result Date: 01/18/2024 XRs of the lumbar spine from 01/18/2024 were independently reviewed and interpreted, showing disc height loss with anterior osteophyte formation at all levels in the lumbar spine.  No evidence of instability on flexion/extension views.  No fracture or dislocation seen.   Disposition: Discharge disposition: 01-Home or Self Care       Discharge Instructions     Call MD / Call 911   Complete by: As directed    If you experience chest pain or shortness of breath, CALL 911 and be transported to the hospital emergency room.  If you develope a fever above 101 F, pus (white drainage) or increased drainage or redness at the wound, or calf  pain, call your surgeon's office.   Constipation Prevention   Complete by: As directed    Drink plenty of fluids.  Prune juice may be helpful.  You may use a stool softener, such as Colace (over the counter) 100 mg twice a day.  Use MiraLax  (over the counter) for constipation as needed.   Diet - low sodium heart healthy   Complete by: As directed    Increase activity slowly  as tolerated   Complete by: As directed    Post-operative opioid taper instructions:   Complete by: As directed    POST-OPERATIVE OPIOID TAPER INSTRUCTIONS: It is important to wean off of your opioid medication as soon as possible. If you do not need pain medication after your surgery it is ok to stop day one. Opioids include: Codeine, Hydrocodone (Norco, Vicodin), Oxycodone(Percocet, oxycontin) and hydromorphone  amongst others.  Long term and even short term use of opiods can cause: Increased pain response Dependence Constipation Depression Respiratory depression And more.  Withdrawal symptoms can include Flu like symptoms Nausea, vomiting And more Techniques to manage these symptoms Hydrate well Eat regular healthy meals Stay active Use relaxation techniques(deep breathing, meditating, yoga) Do Not substitute Alcohol to help with tapering If you have been on opioids for less than two weeks and do not have pain than it is ok to stop all together.  Plan to wean off of opioids This plan should start within one week post op of your joint replacement. Maintain the same interval or time between taking each dose and first decrease the dose.  Cut the total daily intake of opioids by one tablet each day Next start to increase the time between doses. The last dose that should be eliminated is the evening dose.           Follow-up Information     Timothy Ford, MD Follow up in 1 week(s).   Specialty: Orthopedic Surgery Contact information: [address] [place]  [place] [address] [phone]          [facility] [place] . Call.   Why: [facility] will contact you within 48 hours of discharge to arrange a home health visit Contact information: [address] [place] [address] [phone]         Timothy Ford, MD Follow up in 1 week(s).   Specialty: Orthopedic Surgery Contact information: [address] [place] [address] [phone]                  Signed: Timothy Ford 02/04/2024, 8:27 AM

## 2024-02-04 NOTE — Progress Notes (Signed)
 Dressing change has been performed on RLE as stated in the order with dry 4x4 gauze and ACE bandage. Large amount of sanguineous discharge coming from RLE. MD Julio Ohm made aware.

## 2024-02-04 NOTE — Progress Notes (Signed)
 Supplies given to patient to perform dressing change at home.

## 2024-02-05 DIAGNOSIS — Z87891 Personal history of nicotine dependence: Secondary | ICD-10-CM | POA: Diagnosis not present

## 2024-02-05 DIAGNOSIS — Z96651 Presence of right artificial knee joint: Secondary | ICD-10-CM | POA: Diagnosis not present

## 2024-02-05 DIAGNOSIS — J8410 Pulmonary fibrosis, unspecified: Secondary | ICD-10-CM | POA: Diagnosis not present

## 2024-02-05 DIAGNOSIS — Z471 Aftercare following joint replacement surgery: Secondary | ICD-10-CM | POA: Diagnosis not present

## 2024-02-05 DIAGNOSIS — Z7951 Long term (current) use of inhaled steroids: Secondary | ICD-10-CM | POA: Diagnosis not present

## 2024-02-05 DIAGNOSIS — F419 Anxiety disorder, unspecified: Secondary | ICD-10-CM | POA: Diagnosis not present

## 2024-02-05 DIAGNOSIS — J439 Emphysema, unspecified: Secondary | ICD-10-CM | POA: Diagnosis not present

## 2024-02-05 DIAGNOSIS — I10 Essential (primary) hypertension: Secondary | ICD-10-CM | POA: Diagnosis not present

## 2024-02-05 DIAGNOSIS — M419 Scoliosis, unspecified: Secondary | ICD-10-CM | POA: Diagnosis not present

## 2024-02-08 ENCOUNTER — Other Ambulatory Visit: Payer: Self-pay | Admitting: Physician Assistant

## 2024-02-08 ENCOUNTER — Telehealth: Payer: Self-pay

## 2024-02-08 DIAGNOSIS — Z7951 Long term (current) use of inhaled steroids: Secondary | ICD-10-CM | POA: Diagnosis not present

## 2024-02-08 DIAGNOSIS — Z96651 Presence of right artificial knee joint: Secondary | ICD-10-CM | POA: Diagnosis not present

## 2024-02-08 DIAGNOSIS — J439 Emphysema, unspecified: Secondary | ICD-10-CM | POA: Diagnosis not present

## 2024-02-08 DIAGNOSIS — I10 Essential (primary) hypertension: Secondary | ICD-10-CM | POA: Diagnosis not present

## 2024-02-08 DIAGNOSIS — Z471 Aftercare following joint replacement surgery: Secondary | ICD-10-CM | POA: Diagnosis not present

## 2024-02-08 DIAGNOSIS — M419 Scoliosis, unspecified: Secondary | ICD-10-CM | POA: Diagnosis not present

## 2024-02-08 DIAGNOSIS — Z87891 Personal history of nicotine dependence: Secondary | ICD-10-CM | POA: Diagnosis not present

## 2024-02-08 DIAGNOSIS — Z96652 Presence of left artificial knee joint: Secondary | ICD-10-CM

## 2024-02-08 DIAGNOSIS — J8410 Pulmonary fibrosis, unspecified: Secondary | ICD-10-CM | POA: Diagnosis not present

## 2024-02-08 DIAGNOSIS — F419 Anxiety disorder, unspecified: Secondary | ICD-10-CM | POA: Diagnosis not present

## 2024-02-08 NOTE — Telephone Encounter (Signed)
 SW Blaise Bumps. He wanted to request that we put in referral to out patient PT. Next week will be their last week for HHPT.  Referral placed for up stairs.

## 2024-02-08 NOTE — Telephone Encounter (Signed)
 Alveta Awe PT with [facility]  called  triage and wanted to discuss HH on this pt. Please cb at [phone]

## 2024-02-10 ENCOUNTER — Telehealth: Payer: Self-pay | Admitting: Orthopedic Surgery

## 2024-02-10 DIAGNOSIS — Z7951 Long term (current) use of inhaled steroids: Secondary | ICD-10-CM | POA: Diagnosis not present

## 2024-02-10 DIAGNOSIS — Z96651 Presence of right artificial knee joint: Secondary | ICD-10-CM | POA: Diagnosis not present

## 2024-02-10 DIAGNOSIS — M419 Scoliosis, unspecified: Secondary | ICD-10-CM | POA: Diagnosis not present

## 2024-02-10 DIAGNOSIS — F419 Anxiety disorder, unspecified: Secondary | ICD-10-CM | POA: Diagnosis not present

## 2024-02-10 DIAGNOSIS — Z471 Aftercare following joint replacement surgery: Secondary | ICD-10-CM | POA: Diagnosis not present

## 2024-02-10 DIAGNOSIS — J8410 Pulmonary fibrosis, unspecified: Secondary | ICD-10-CM | POA: Diagnosis not present

## 2024-02-10 DIAGNOSIS — Z87891 Personal history of nicotine dependence: Secondary | ICD-10-CM | POA: Diagnosis not present

## 2024-02-10 DIAGNOSIS — I10 Essential (primary) hypertension: Secondary | ICD-10-CM | POA: Diagnosis not present

## 2024-02-10 DIAGNOSIS — J439 Emphysema, unspecified: Secondary | ICD-10-CM | POA: Diagnosis not present

## 2024-02-10 NOTE — Telephone Encounter (Signed)
 I called and sw pt and he said that he wants to use percocet only for night time. I advised that the pain medication is there for him when he needs it and if he does not and tylenol  works sufficiently foe him that he may take that.

## 2024-02-10 NOTE — Telephone Encounter (Signed)
 Patient called. Would like to know if he could just take Tylenol  during the day? His call back #[phone]

## 2024-02-12 ENCOUNTER — Other Ambulatory Visit: Payer: Self-pay | Admitting: *Deleted

## 2024-02-12 ENCOUNTER — Other Ambulatory Visit: Payer: Self-pay | Admitting: Physician Assistant

## 2024-02-12 ENCOUNTER — Telehealth: Payer: Self-pay | Admitting: *Deleted

## 2024-02-12 DIAGNOSIS — Z96651 Presence of right artificial knee joint: Secondary | ICD-10-CM | POA: Diagnosis not present

## 2024-02-12 DIAGNOSIS — F419 Anxiety disorder, unspecified: Secondary | ICD-10-CM | POA: Diagnosis not present

## 2024-02-12 DIAGNOSIS — Z87891 Personal history of nicotine dependence: Secondary | ICD-10-CM | POA: Diagnosis not present

## 2024-02-12 DIAGNOSIS — J8410 Pulmonary fibrosis, unspecified: Secondary | ICD-10-CM | POA: Diagnosis not present

## 2024-02-12 DIAGNOSIS — M1711 Unilateral primary osteoarthritis, right knee: Secondary | ICD-10-CM

## 2024-02-12 DIAGNOSIS — Z7951 Long term (current) use of inhaled steroids: Secondary | ICD-10-CM | POA: Diagnosis not present

## 2024-02-12 DIAGNOSIS — J439 Emphysema, unspecified: Secondary | ICD-10-CM | POA: Diagnosis not present

## 2024-02-12 DIAGNOSIS — Z471 Aftercare following joint replacement surgery: Secondary | ICD-10-CM | POA: Diagnosis not present

## 2024-02-12 DIAGNOSIS — I10 Essential (primary) hypertension: Secondary | ICD-10-CM | POA: Diagnosis not present

## 2024-02-12 DIAGNOSIS — M419 Scoliosis, unspecified: Secondary | ICD-10-CM | POA: Diagnosis not present

## 2024-02-12 MED ORDER — OXYCODONE-ACETAMINOPHEN 5-325 MG PO TABS: 1.0000 | ORAL_TABLET | Freq: Four times a day (QID) | ORAL | 0 refills | Status: DC | PRN

## 2024-02-12 NOTE — Telephone Encounter (Signed)
Patient aware medication has been sent

## 2024-02-12 NOTE — Telephone Encounter (Signed)
 Just sent

## 2024-02-12 NOTE — Telephone Encounter (Signed)
 Patient needs refill of pain medication prior to the weekend. I think Dr. Julio Ohm is out of office today. Could you refill? He is S/P total knee on 02/03/24. Thank you.

## 2024-02-15 ENCOUNTER — Ambulatory Visit: Admitting: Rehabilitative and Restorative Service Providers"

## 2024-02-15 DIAGNOSIS — Z87891 Personal history of nicotine dependence: Secondary | ICD-10-CM | POA: Diagnosis not present

## 2024-02-15 DIAGNOSIS — J8410 Pulmonary fibrosis, unspecified: Secondary | ICD-10-CM | POA: Diagnosis not present

## 2024-02-15 DIAGNOSIS — F419 Anxiety disorder, unspecified: Secondary | ICD-10-CM | POA: Diagnosis not present

## 2024-02-15 DIAGNOSIS — Z7951 Long term (current) use of inhaled steroids: Secondary | ICD-10-CM | POA: Diagnosis not present

## 2024-02-15 DIAGNOSIS — M419 Scoliosis, unspecified: Secondary | ICD-10-CM | POA: Diagnosis not present

## 2024-02-15 DIAGNOSIS — Z471 Aftercare following joint replacement surgery: Secondary | ICD-10-CM | POA: Diagnosis not present

## 2024-02-15 DIAGNOSIS — J439 Emphysema, unspecified: Secondary | ICD-10-CM | POA: Diagnosis not present

## 2024-02-15 DIAGNOSIS — Z96651 Presence of right artificial knee joint: Secondary | ICD-10-CM | POA: Diagnosis not present

## 2024-02-15 DIAGNOSIS — I10 Essential (primary) hypertension: Secondary | ICD-10-CM | POA: Diagnosis not present

## 2024-02-16 ENCOUNTER — Ambulatory Visit (INDEPENDENT_AMBULATORY_CARE_PROVIDER_SITE_OTHER): Admitting: Family

## 2024-02-16 ENCOUNTER — Other Ambulatory Visit (INDEPENDENT_AMBULATORY_CARE_PROVIDER_SITE_OTHER)

## 2024-02-16 ENCOUNTER — Encounter: Payer: Self-pay | Admitting: Family

## 2024-02-16 DIAGNOSIS — Z96651 Presence of right artificial knee joint: Secondary | ICD-10-CM | POA: Diagnosis not present

## 2024-02-16 DIAGNOSIS — Z87891 Personal history of nicotine dependence: Secondary | ICD-10-CM | POA: Diagnosis not present

## 2024-02-16 DIAGNOSIS — J439 Emphysema, unspecified: Secondary | ICD-10-CM | POA: Diagnosis not present

## 2024-02-16 DIAGNOSIS — I10 Essential (primary) hypertension: Secondary | ICD-10-CM | POA: Diagnosis not present

## 2024-02-16 DIAGNOSIS — Z7951 Long term (current) use of inhaled steroids: Secondary | ICD-10-CM | POA: Diagnosis not present

## 2024-02-16 DIAGNOSIS — M419 Scoliosis, unspecified: Secondary | ICD-10-CM | POA: Diagnosis not present

## 2024-02-16 DIAGNOSIS — F419 Anxiety disorder, unspecified: Secondary | ICD-10-CM | POA: Diagnosis not present

## 2024-02-16 DIAGNOSIS — Z471 Aftercare following joint replacement surgery: Secondary | ICD-10-CM | POA: Diagnosis not present

## 2024-02-16 DIAGNOSIS — J8410 Pulmonary fibrosis, unspecified: Secondary | ICD-10-CM | POA: Diagnosis not present

## 2024-02-16 NOTE — Progress Notes (Signed)
 Post-Op Visit Note   Patient: Gregg Smith           Date of Birth: 01-23-1946           MRN: [id number] Visit Date: 02/16/2024 PCP: Elvira Hammersmith, MD  Chief Complaint:  Chief Complaint  Patient presents with   Right Knee - Routine Post Op    02/03/24 Right TKA    HPI:  HPI The patient is a 78 year old gentleman seen status post total knee arthroplasty on the right in June 4.  He completes his home health PT this week.  He is set up for outpatient physical therapy.  He is concerned about a little tightness in the popliteal fossa overall feels well Ortho Exam On examination right knee his incision is healing well staples are in place there is no gaping or drainage moderate edema.  Visit Diagnoses:  1. S/P TKR (total knee replacement), right     Plan: Staples harvested without incident.  May shower may get this wet advance to outpatient PT.  Follow-Up Instructions: No follow-ups on file.   Imaging: No results found.  Orders:  Orders Placed This Encounter  Procedures   XR Knee 1-2 Views Right   No orders of the defined types were placed in this encounter.    PMFS History: Patient Active Problem List   Diagnosis Date Noted   Osteoarthritis (arthritis due to wear and tear of joints) 02/03/2024   Left leg weakness 11/03/2023   Osteoarthritis of lumbar spine with myelopathy 11/03/2023   Chronic pain of right knee 09/21/2023   Centrilobular emphysema (HCC) 06/04/2023   Interstitial lung disease (HCC) 06/04/2023   Chronic cough 02/03/2023   Overactive bladder 09/17/2022   Aortic atherosclerosis (HCC) 03/17/2022   Acute insomnia 10/23/2021   Chronic left-sided low back pain with left-sided sciatica 03/05/2021   Unilateral primary osteoarthritis, right knee 01/31/2021   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   Scoliosis deformity of spine 04/22/2019   Generalized anxiety disorder    Panic disorder    Insomnia    Spinal stenosis of lumbar  region 11/30/2018   GERD (gastroesophageal reflux disease) 08/14/2017   S/P left TKA 08/11/2016   S/P knee replacement 08/11/2016   OSA (obstructive sleep apnea) 03/22/2014   Snoring 05/09/2013   Essential hypertension 02/11/2013   Dyslipidemia 02/11/2013   Arthritis 02/11/2013   Past Medical History:  Diagnosis Date   Allergy    seasonal    Anxiety    Arthritis    Barrett's esophagus    Cataract    Chicken pox    Colon polyps    hyperplastic   Diverticulosis    Emphysema lung (HCC)    GERD (gastroesophageal reflux disease)    Hemorrhoids    Hyperlipidemia    Hypertension    ILD (interstitial lung disease) (HCC)    12/18/23 Mild basilar fibrotic interstitial lung disease, 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   Insomnia    Leg cramps    Measles    Mumps    Obstructive sleep apnea    Pneumonia    Sleep apnea    dental device; no longer uses    Family History  Problem Relation Age of Onset   Alzheimer's disease Mother    Cancer Father    Stomach cancer Father    Aortic aneurysm Son    Heart disease Son    Cancer Maternal Grandmother    Heart disease Maternal Grandfather    Stomach  cancer Paternal Grandfather    Colon cancer Neg Hx    Colon polyps Neg Hx    Esophageal cancer Neg Hx    Rectal cancer Neg Hx    Pancreatic cancer Neg Hx    Liver cancer Neg Hx     Past Surgical History:  Procedure Laterality Date   APPENDECTOMY  1957   CATARACT EXTRACTION     COLONOSCOPY     JOINT REPLACEMENT     TONSILLECTOMY     TONSILLECTOMY AND ADENOIDECTOMY  1954   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   Procedure: LEFT TOTAL KNEE ARTHROPLASTY;  Surgeon: Claiborne Crew, MD;  Location: [facility] ORS;  Service: Orthopedics;  Laterality: Left;   TOTAL KNEE ARTHROPLASTY Right 02/03/2024   Procedure: ARTHROPLASTY, KNEE, TOTAL;  Surgeon: Timothy Ford, MD;  Location: [facility] OR;  Service: Orthopedics;  Laterality: Right;   UPPER GASTROINTESTINAL ENDOSCOPY     Social History    Occupational History   Occupation: Retired    Comment: Loss adjuster, chartered: [employer]  Tobacco Use   Smoking status: Former    Current packs/day: 0.00    Average packs/day: 1 pack/day for 40.0 years (40.0 ttl pk-yrs)    Types: Cigarettes    Start date: 01/19/1967    Quit date: 01/19/2007    Years since quitting: 17.0   Smokeless tobacco: Never  Vaping Use   Vaping status: Never Used  Substance and Sexual Activity   Alcohol use: Yes    Alcohol/week: 3.0 standard drinks of alcohol    Types: 3 Cans of beer per week   Drug use: No   Sexual activity: Not on file

## 2024-02-17 ENCOUNTER — Encounter: Payer: Self-pay | Admitting: Rehabilitative and Restorative Service Providers"

## 2024-02-17 ENCOUNTER — Ambulatory Visit: Admitting: Rehabilitative and Restorative Service Providers"

## 2024-02-17 DIAGNOSIS — M25561 Pain in right knee: Secondary | ICD-10-CM

## 2024-02-17 DIAGNOSIS — R262 Difficulty in walking, not elsewhere classified: Secondary | ICD-10-CM

## 2024-02-17 DIAGNOSIS — M25661 Stiffness of right knee, not elsewhere classified: Secondary | ICD-10-CM | POA: Diagnosis not present

## 2024-02-17 DIAGNOSIS — M6281 Muscle weakness (generalized): Secondary | ICD-10-CM

## 2024-02-17 DIAGNOSIS — R600 Localized edema: Secondary | ICD-10-CM

## 2024-02-17 NOTE — Therapy (Signed)
 OUTPATIENT PHYSICAL THERAPY LOWER EXTREMITY EVALUATION   Patient Name: Gregg Smith MRN: [id number] DOB:June 20, 1946, 78 y.o., male Today's Date: 02/17/2024  END OF SESSION:  PT End of Session - 02/17/24 1638     Visit Number 1    Number of Visits 24    Date for PT Re-Evaluation 04/13/24    Progress Note Due on Visit 10    PT Start Time 1308    PT Stop Time 1402    PT Time Calculation (min) 54 min    Activity Tolerance Patient tolerated treatment well;No increased pain;Patient limited by pain    Behavior During Therapy [facility] for tasks assessed/performed          Past Medical History:  Diagnosis Date   Allergy    seasonal    Anxiety    Arthritis    Barrett's esophagus    Cataract    Chicken pox    Colon polyps    hyperplastic   Diverticulosis    Emphysema lung (HCC)    GERD (gastroesophageal reflux disease)    Hemorrhoids    Hyperlipidemia    Hypertension    ILD (interstitial lung disease) (HCC)    12/18/23 Mild basilar fibrotic interstitial lung disease, 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   Procedure: LEFT TOTAL KNEE ARTHROPLASTY;  Surgeon: Claiborne Crew, MD;  Location: [facility];  Service: Orthopedics;  Laterality: Left;   TOTAL KNEE ARTHROPLASTY Right 02/03/2024   Procedure: ARTHROPLASTY, KNEE, TOTAL;  Surgeon: Timothy Ford, MD;  Location: [facility] OR;  Service: Orthopedics;  Laterality: Right;   UPPER GASTROINTESTINAL ENDOSCOPY     Patient Active Problem List   Diagnosis Date Noted   Osteoarthritis (arthritis due to wear and tear of joints) 02/03/2024   Left leg weakness 11/03/2023    Osteoarthritis of lumbar spine with myelopathy 11/03/2023   Chronic pain of right knee 09/21/2023   Centrilobular emphysema (HCC) 06/04/2023   Interstitial lung disease (HCC) 06/04/2023   Chronic cough 02/03/2023   Overactive bladder 09/17/2022   Aortic atherosclerosis (HCC) 03/17/2022   Acute insomnia 10/23/2021   Chronic left-sided low back pain with left-sided sciatica 03/05/2021   Unilateral primary osteoarthritis, right knee 01/31/2021   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   Scoliosis deformity of spine 04/22/2019   Generalized anxiety disorder    Panic disorder    Insomnia    Spinal stenosis of lumbar region 11/30/2018   GERD (gastroesophageal reflux disease) 08/14/2017   S/P left TKA 08/11/2016   S/P knee replacement 08/11/2016   OSA (obstructive sleep apnea) 03/22/2014   Snoring 05/09/2013   Essential hypertension 02/11/2013   Dyslipidemia 02/11/2013   Arthritis 02/11/2013    PCP: Elvira Hammersmith, MD  REFERRING PROVIDER: Butch Cashing, PA-C  REFERRING DIAG: [phone] (ICD-10-CM) - Status post left knee replacement   THERAPY DIAG:  Difficulty in walking, not elsewhere classified - Plan: PT plan of care cert/re-cert  Localized edema - Plan: PT plan of care cert/re-cert  Muscle weakness (generalized) - Plan: PT plan of care cert/re-cert  Stiffness of right knee, not elsewhere classified - Plan: PT plan of care cert/re-cert  Acute pain of right knee - Plan: PT plan of care cert/re-cert  Rationale for Evaluation and Treatment: Rehabilitation  ONSET DATE: February 03, 2024  SUBJECTIVE:   SUBJECTIVE STATEMENT: Gregg Smith reports sleep has been pretty good since surgery.  1 mg of his sleep medication (he can take up to 2).  He notes some sciatica on the left.    PERTINENT HISTORY: Arthritis, sciatica, emphysema, HLD, HTN, left TKA, former 40-year smoker  PAIN:  Are you having pain? Yes: NPRS scale: 1.5-6.5/10 this week Pain location: Rt  knee Pain description: Achy, stiff, discomfort Aggravating factors: Weight-bearing, prolonged postures Relieving factors: 2 Percocet, ice per day and tylenol   PRECAUTIONS: Knee and Back  RED FLAGS: None   WEIGHT BEARING RESTRICTIONS: No  FALLS:  Has patient fallen in last 6 months? No  LIVING ENVIRONMENT: Lives with: lives with their family and lives with their son Lives in: House/apartment Stairs: Is doing stairs with the handrail and a cane Has following equipment at home: Single point cane and Environmental consultant - 2 wheeled  OCCUPATION: Retired PHD  PLOF: Independent  PATIENT GOALS: Be able to weed and garden, do yard work without knee or back restrictions  NEXT MD VISIT: 03/15/2024  OBJECTIVE:  Note: Objective measures were completed at Evaluation unless otherwise noted.  DIAGNOSTIC FINDINGS: Radiographs of the right knee show stable alignment of total knee  arthroplasty hardware.  There is no complicating feature.   PATIENT SURVEYS:  Patient-Specific Activity Scoring Scheme  0 represents "unable to perform." 10 represents "able to perform at prior level. 0 1 2 3 4 5 6 7 8 9  10 (Date and Score)   Activity Eval     1.  Sit on a low seat 5/10    2.  Clean the floor or garden 4/10    3.  Do laundry 4/10   4.  Take shower 6/10   5.  Work on laptop 7/10   Score 5.2 average    Total score = sum of the activity scores/number of activities Minimum detectable change (90%CI) for average score = 2 points Minimum detectable change (90%CI) for single activity score = 3 points     COGNITION: Overall cognitive status: Within functional limits for tasks assessed     SENSATION: No complaints of peripheral pain or paresthesias  EDEMA:  Noted and not objectively assessed   LOWER EXTREMITY ROM:  Active ROM Left/Right 02/17/2024   Hip flexion    Hip extension    Hip abduction    Hip adduction    Hip internal rotation    Hip external rotation    Knee flexion 115/88    Knee extension 0/-13   Ankle dorsiflexion    Ankle plantarflexion    Ankle inversion    Ankle eversion     (Blank rows = not tested)  LOWER EXTREMITY MMT:  MMT Left/Right 02/15/2024   Hip flexion    Hip extension    Hip abduction    Hip adduction    Hip internal rotation    Hip external rotation    Knee flexion    Knee extension 4+/2+   Ankle dorsiflexion    Ankle plantarflexion    Ankle inversion    Ankle eversion     (Blank rows = not tested)  GAIT: Distance walked: 100 feet Assistive device utilized: Environmental consultant -  2 wheeled Level of assistance: Complete Independence Comments: Rahn was assistive device free before surgery                                                                                                                                TREATMENT DATE: 02/17/2024 Quadriceps sets 10 + 5 + 5 reps for 5 seconds with heel prop Seated tailgate knee flexion 1 minute AAROM right knee flexion 10 x 10 seconds with TKE in between Seated knee extension stretch with foot in chair and armrest used to prevent hip external rotation 3 minutes  97535: Reviewed examination findings; day 1 home exercise program; importance of improving active range of motion with particular emphasis on extension, quadriceps strength and control edema; reviewed X acted outcomes and different points post-surgery and discussed Tegaderm for use when showering to protect the surgical wound.  PATIENT EDUCATION:  Education details: See above Person educated: Patient Education method: Explanation, Demonstration, Tactile cues, Verbal cues, and Handouts Education comprehension: verbalized understanding, returned demonstration, verbal cues required, tactile cues required, and needs further education  HOME EXERCISE PROGRAM: Access Code: [email or web address] URL: [email or web address] Date: 02/17/2024 Prepared by: Terral Ferrari  Exercises - Supine Quadricep Sets  - 10 x daily - 7 x weekly - 1 sets -  10 reps - 5 second hold - Seated Knee Flexion AAROM  - 5 x daily - 7 x weekly - 1 sets - 10 reps - 10 seconds hold - Seated Passive Knee Extension  - 3 x daily - 7 x weekly - 1 sets - 1 reps - 3 minutes hold  ASSESSMENT:  CLINICAL IMPRESSION: Patient is a 78 y.o. male who was seen today for physical therapy evaluation and treatment for Z96.652 (ICD-10-CM) - Status post left knee replacement.  Williard has active range of motion, quadriceps strength, edema, pain and functional impairments in need of skilled care.  He was started on an appropriate post TKA rehabilitation protocol with early emphasis on active range of motion (particularly extension), quadriceps strengthening and edema control.  Balance, gait and more advanced functional activities will be completed as appropriate.  OBJECTIVE IMPAIRMENTS: Abnormal gait, decreased activity tolerance, decreased balance, decreased endurance, decreased knowledge of condition, difficulty walking, decreased ROM, decreased strength, increased edema, impaired perceived functional ability, and pain.   ACTIVITY LIMITATIONS: bending, standing, squatting, stairs, and locomotion level  PARTICIPATION LIMITATIONS: cleaning, laundry, driving, community activity, and yard work  PERSONAL FACTORS: Arthritis, emphysema, HLD, HTN, left TKA, former 40-year smoker are also affecting patient's functional outcome.   REHAB POTENTIAL: Good  CLINICAL DECISION MAKING: Stable/uncomplicated  EVALUATION COMPLEXITY: Low   GOALS: Goals reviewed with patient? Yes  SHORT TERM GOALS: Target date: 03/16/2024 Ronel will be independent with his day 1 home exercise program Baseline: Started 02/17/2024 Goal status: INITIAL  2.  Improve right knee active range of motion to 0 - 5 - 105 degrees Baseline: 0 - 13 - 88 degrees Goal status: INITIAL  3.  Improve right quadriceps strength as assessed by MMT Baseline: 2+/5 Goal status: INITIAL   LONG TERM GOALS: Target date:  04/13/2024  Improve patient specific functional score to at least 7.2 Baseline: 5.2 Goal status: INITIAL  2.  Kymoni will report right knee pain consistently 0-3/10 on the numeric pain rating scale Baseline: 1.5 - 6.5/10 Goal status: INITIAL  3.  Improve right knee active range of motion to 0 - 3 - 110 degrees or better Baseline: 0 - 13 - 88 degrees Goal status: INITIAL  4.  Improve right quadriceps strength as assessed by Kashawn's ability to walk without the use of an assistive device and send and descend stairs reciprocally with use of a single handrail Baseline: Unable at evaluation Goal status: INITIAL  5.  Alandis will be independent and compliant with his long-term maintenance home exercise program at discharge Baseline: Started 02/17/2024 Goal status: INITIAL   PLAN:  PT FREQUENCY: 3x/week  PT DURATION: 8 weeks  PLANNED INTERVENTIONS: 97750- Physical Performance Testing, 97110-Therapeutic exercises, 97530- Therapeutic activity, 97112- Neuromuscular re-education, 97535- Self Care, 46962- Manual therapy, [phone]- Gait training, [phone]- Vasopneumatic device, Patient/Family education, Balance training, Stair training, Joint mobilization, and Cryotherapy  PLAN FOR NEXT SESSION: Typical post total knee rehabilitation with particular emphasis on extension active range of motion and strength   Joli Neas, PT, MPT 02/17/2024, 4:58 PM

## 2024-02-19 ENCOUNTER — Ambulatory Visit (INDEPENDENT_AMBULATORY_CARE_PROVIDER_SITE_OTHER): Admitting: Physical Therapy

## 2024-02-19 ENCOUNTER — Telehealth: Payer: Self-pay

## 2024-02-19 ENCOUNTER — Encounter: Payer: Self-pay | Admitting: Physical Therapy

## 2024-02-19 DIAGNOSIS — R600 Localized edema: Secondary | ICD-10-CM | POA: Diagnosis not present

## 2024-02-19 DIAGNOSIS — M6281 Muscle weakness (generalized): Secondary | ICD-10-CM

## 2024-02-19 DIAGNOSIS — R262 Difficulty in walking, not elsewhere classified: Secondary | ICD-10-CM

## 2024-02-19 DIAGNOSIS — M25661 Stiffness of right knee, not elsewhere classified: Secondary | ICD-10-CM | POA: Diagnosis not present

## 2024-02-19 DIAGNOSIS — M25561 Pain in right knee: Secondary | ICD-10-CM | POA: Diagnosis not present

## 2024-02-19 NOTE — Telephone Encounter (Signed)
 FYI  Pt called and wanted to know if he can get his incision wet. Advised that per erins note this was ok. I advised not to submerge and No pools or hot tubs. He stated understanding

## 2024-02-19 NOTE — Telephone Encounter (Signed)
 Noted

## 2024-02-19 NOTE — Therapy (Signed)
 OUTPATIENT PHYSICAL THERAPY LOWER EXTREMITY TREATMENT    Patient Name: MASEN LUALLEN MRN: [id number] DOB:1945-12-02, 78 y.o., male Today's Date: 02/19/2024  END OF SESSION:  PT End of Session - 02/19/24 0941     Visit Number 2    Number of Visits 24    Date for PT Re-Evaluation 04/13/24    Progress Note Due on Visit 10    PT Start Time 0935    PT Stop Time 1013    PT Time Calculation (min) 38 min    Activity Tolerance Patient tolerated treatment well;No increased pain;Patient limited by pain    Behavior During Therapy [facility] for tasks assessed/performed           Past Medical History:  Diagnosis Date   Allergy    seasonal    Anxiety    Arthritis    Barrett's esophagus    Cataract    Chicken pox    Colon polyps    hyperplastic   Diverticulosis    Emphysema lung (HCC)    GERD (gastroesophageal reflux disease)    Hemorrhoids    Hyperlipidemia    Hypertension    ILD (interstitial lung disease) (HCC)    12/18/23 Mild basilar fibrotic interstitial lung disease, 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   Procedure: LEFT TOTAL KNEE ARTHROPLASTY;  Surgeon: Claiborne Crew, MD;  Location: [facility];  Service: Orthopedics;  Laterality: Left;   TOTAL KNEE ARTHROPLASTY Right 02/03/2024   Procedure: ARTHROPLASTY, KNEE, TOTAL;  Surgeon: Timothy Ford, MD;  Location: [facility] OR;  Service: Orthopedics;  Laterality: Right;   UPPER GASTROINTESTINAL ENDOSCOPY     Patient Active Problem List   Diagnosis Date Noted   Osteoarthritis (arthritis due to wear and tear of joints) 02/03/2024   Left leg weakness 11/03/2023    Osteoarthritis of lumbar spine with myelopathy 11/03/2023   Chronic pain of right knee 09/21/2023   Centrilobular emphysema (HCC) 06/04/2023   Interstitial lung disease (HCC) 06/04/2023   Chronic cough 02/03/2023   Overactive bladder 09/17/2022   Aortic atherosclerosis (HCC) 03/17/2022   Acute insomnia 10/23/2021   Chronic left-sided low back pain with left-sided sciatica 03/05/2021   Unilateral primary osteoarthritis, right knee 01/31/2021   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   Scoliosis deformity of spine 04/22/2019   Generalized anxiety disorder    Panic disorder    Insomnia    Spinal stenosis of lumbar region 11/30/2018   GERD (gastroesophageal reflux disease) 08/14/2017   S/P left TKA 08/11/2016   S/P knee replacement 08/11/2016   OSA (obstructive sleep apnea) 03/22/2014   Snoring 05/09/2013   Essential hypertension 02/11/2013   Dyslipidemia 02/11/2013   Arthritis 02/11/2013    PCP: Elvira Hammersmith, MD  REFERRING PROVIDER: Butch Cashing, PA-C  REFERRING DIAG: [phone] (ICD-10-CM) - Status post left knee replacement   THERAPY DIAG:  Localized edema  Difficulty in walking, not elsewhere classified  Muscle weakness (generalized)  Stiffness of right knee, not elsewhere classified  Acute pain of right knee  Rationale for Evaluation and Treatment: Rehabilitation  ONSET DATE: February 03, 2024  SUBJECTIVE:   SUBJECTIVE STATEMENT:  Having a little pain this morning, sleep is going well   PERTINENT HISTORY: Arthritis, sciatica, emphysema, HLD, HTN, left TKA, former 40-year smoker  PAIN:  Are you having pain? Yes: NPRS scale: 3/10 this week Pain location: Rt knee Pain description: Achy, stiff, discomfort Aggravating factors: Weight-bearing, prolonged postures Relieving factors: 2 Percocet, ice per day and tylenol   PRECAUTIONS: Knee and Back  RED FLAGS: None   WEIGHT BEARING RESTRICTIONS: No  FALLS:  Has patient fallen in last 6  months? No  LIVING ENVIRONMENT: Lives with: lives with their family and lives with their son Lives in: House/apartment Stairs: Is doing stairs with the handrail and a cane Has following equipment at home: Single point cane and Environmental consultant - 2 wheeled  OCCUPATION: Retired PHD  PLOF: Independent  PATIENT GOALS: Be able to weed and garden, do yard work without knee or back restrictions  NEXT MD VISIT: 03/15/2024  OBJECTIVE:  Note: Objective measures were completed at Evaluation unless otherwise noted.  DIAGNOSTIC FINDINGS: Radiographs of the right knee show stable alignment of total knee  arthroplasty hardware.  There is no complicating feature.   PATIENT SURVEYS:  Patient-Specific Activity Scoring Scheme  0 represents "unable to perform." 10 represents "able to perform at prior level. 0 1 2 3 4 5 6 7 8 9  10 (Date and Score)   Activity Eval     1.  Sit on a low seat 5/10    2.  Clean the floor or garden 4/10    3.  Do laundry 4/10   4.  Take shower 6/10   5.  Work on laptop 7/10   Score 5.2 average    Total score = sum of the activity scores/number of activities Minimum detectable change (90%CI) for average score = 2 points Minimum detectable change (90%CI) for single activity score = 3 points     COGNITION: Overall cognitive status: Within functional limits for tasks assessed     SENSATION: No complaints of peripheral pain or paresthesias  EDEMA:  Noted and not objectively assessed   LOWER EXTREMITY ROM:  Active ROM Left/Right 02/17/2024   Hip flexion    Hip extension    Hip abduction    Hip adduction    Hip internal rotation    Hip external rotation    Knee flexion 115/88   Knee extension 0/-13   Ankle dorsiflexion    Ankle plantarflexion    Ankle inversion    Ankle eversion     (Blank rows = not tested)  LOWER EXTREMITY MMT:  MMT Left/Right 02/15/2024   Hip flexion    Hip extension    Hip abduction    Hip adduction    Hip internal rotation     Hip external rotation    Knee flexion    Knee extension 4+/2+   Ankle dorsiflexion    Ankle plantarflexion    Ankle inversion    Ankle eversion     (Blank rows = not tested)  GAIT: Distance walked: 100 feet Assistive device utilized: Walker - 2 wheeled Level of assistance: Complete Independence Comments: Carly was assistive device free before surgery  TREATMENT DATE:   02/19/24  Scifit bike seat 12-->11 x10 min full rotations for ROM   Knee self flexion stretch 15x3 seconds  HS stretches R 3x30 seconds Piriformis stretches R 3x30 seconds  SAQs 0# 15x3 seconds- 2 rounds  LAQs 0# 12x3 seconds   Patella mobs  OP for knee extension in supine     02/17/2024 Quadriceps sets 10 + 5 + 5 reps for 5 seconds with heel prop Seated tailgate knee flexion 1 minute AAROM right knee flexion 10 x 10 seconds with TKE in between Seated knee extension stretch with foot in chair and armrest used to prevent hip external rotation 3 minutes  97535: Reviewed examination findings; day 1 home exercise program; importance of improving active range of motion with particular emphasis on extension, quadriceps strength and control edema; reviewed X acted outcomes and different points post-surgery and discussed Tegaderm for use when showering to protect the surgical wound.  PATIENT EDUCATION:  Education details: See above Person educated: Patient Education method: Explanation, Demonstration, Tactile cues, Verbal cues, and Handouts Education comprehension: verbalized understanding, returned demonstration, verbal cues required, tactile cues required, and needs further education  HOME EXERCISE PROGRAM: Access Code: [email or web address] URL: [email or web address] Date: 02/17/2024 Prepared by: Terral Ferrari  Exercises - Supine Quadricep Sets  - 10 x daily - 7 x weekly - 1  sets - 10 reps - 5 second hold - Seated Knee Flexion AAROM  - 5 x daily - 7 x weekly - 1 sets - 10 reps - 10 seconds hold - Seated Passive Knee Extension  - 3 x daily - 7 x weekly - 1 sets - 1 reps - 3 minutes hold  ASSESSMENT:  CLINICAL IMPRESSION:  Arrived doing OK today, we worked on ROM and functional strength as appropriate this morning. He got staples out recently, this has helped his knee feel better. Encouraged HEP compliance especially in early phases of TKR recovery. Does have very tight hip external rotators, we will address this as we go along.  Will continue to progress as able/tolerated.     EVAL: Patient is a 78 y.o. male who was seen today for physical therapy evaluation and treatment for Z96.652 (ICD-10-CM) - Status post left knee replacement.  Jahon has active range of motion, quadriceps strength, edema, pain and functional impairments in need of skilled care.  He was started on an appropriate post TKA rehabilitation protocol with early emphasis on active range of motion (particularly extension), quadriceps strengthening and edema control.  Balance, gait and more advanced functional activities will be completed as appropriate.  OBJECTIVE IMPAIRMENTS: Abnormal gait, decreased activity tolerance, decreased balance, decreased endurance, decreased knowledge of condition, difficulty walking, decreased ROM, decreased strength, increased edema, impaired perceived functional ability, and pain.   ACTIVITY LIMITATIONS: bending, standing, squatting, stairs, and locomotion level  PARTICIPATION LIMITATIONS: cleaning, laundry, driving, community activity, and yard work  PERSONAL FACTORS: Arthritis, emphysema, HLD, HTN, left TKA, former 40-year smoker are also affecting patient's functional outcome.   REHAB POTENTIAL: Good  CLINICAL DECISION MAKING: Stable/uncomplicated  EVALUATION COMPLEXITY: Low   GOALS: Goals reviewed with patient? Yes  SHORT TERM GOALS: Target date:  03/16/2024 Jermari will be independent with his day 1 home exercise program Baseline: Started 02/17/2024 Goal status: INITIAL  2.  Improve right knee active range of motion to 0 - 5 - 105 degrees Baseline: 0 - 13 - 88 degrees Goal status: INITIAL  3.  Improve right quadriceps strength as assessed by MMT Baseline: 2+/5 Goal status: INITIAL  LONG TERM GOALS: Target date: 04/13/2024  Improve patient specific functional score to at least 7.2 Baseline: 5.2 Goal status: INITIAL  2.  Weldon will report right knee pain consistently 0-3/10 on the numeric pain rating scale Baseline: 1.5 - 6.5/10 Goal status: INITIAL  3.  Improve right knee active range of motion to 0 - 3 - 110 degrees or better Baseline: 0 - 13 - 88 degrees Goal status: INITIAL  4.  Improve right quadriceps strength as assessed by Kacey's ability to walk without the use of an assistive device and send and descend stairs reciprocally with use of a single handrail Baseline: Unable at evaluation Goal status: INITIAL  5.  Chatham will be independent and compliant with his long-term maintenance home exercise program at discharge Baseline: Started 02/17/2024 Goal status: INITIAL   PLAN:  PT FREQUENCY: 3x/week  PT DURATION: 8 weeks  PLANNED INTERVENTIONS: 97750- Physical Performance Testing, 97110-Therapeutic exercises, 97530- Therapeutic activity, 97112- Neuromuscular re-education, 97535- Self Care, 16109- Manual therapy, [phone]- Gait training, [phone]- Vasopneumatic device, Patient/Family education, Balance training, Stair training, Joint mobilization, and Cryotherapy  PLAN FOR NEXT SESSION: Typical post total knee rehabilitation with particular emphasis on extension active range of motion and strength, manual and edema control PRN, work on hip ER stretches    Terrel Ferries, PT, DPT 02/19/24 10:15 AM

## 2024-02-22 ENCOUNTER — Ambulatory Visit: Payer: Self-pay | Admitting: Physical Therapy

## 2024-02-22 ENCOUNTER — Encounter: Payer: Self-pay | Admitting: Physical Therapy

## 2024-02-22 DIAGNOSIS — M25561 Pain in right knee: Secondary | ICD-10-CM

## 2024-02-22 DIAGNOSIS — M6281 Muscle weakness (generalized): Secondary | ICD-10-CM

## 2024-02-22 DIAGNOSIS — R262 Difficulty in walking, not elsewhere classified: Secondary | ICD-10-CM | POA: Diagnosis not present

## 2024-02-22 DIAGNOSIS — R600 Localized edema: Secondary | ICD-10-CM

## 2024-02-22 DIAGNOSIS — M25661 Stiffness of right knee, not elsewhere classified: Secondary | ICD-10-CM

## 2024-02-22 NOTE — Therapy (Signed)
 OUTPATIENT PHYSICAL THERAPY LOWER EXTREMITY TREATMENT    Patient Name: Gregg Smith MRN: [id number] DOB:1946-05-09, 78 y.o., male Today's Date: 02/22/2024  END OF SESSION:  PT End of Session - 02/22/24 1348     Visit Number 3    Number of Visits 24    Date for PT Re-Evaluation 04/13/24    Progress Note Due on Visit 10    PT Start Time 1348    PT Stop Time 1436    PT Time Calculation (min) 48 min    Activity Tolerance Patient tolerated treatment well;No increased pain;Patient limited by pain    Behavior During Therapy [facility] for tasks assessed/performed            Past Medical History:  Diagnosis Date   Allergy    seasonal    Anxiety    Arthritis    Barrett's esophagus    Cataract    Chicken pox    Colon polyps    hyperplastic   Diverticulosis    Emphysema lung (HCC)    GERD (gastroesophageal reflux disease)    Hemorrhoids    Hyperlipidemia    Hypertension    ILD (interstitial lung disease) (HCC)    12/18/23 Mild basilar fibrotic interstitial lung disease, 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   Procedure: LEFT TOTAL KNEE ARTHROPLASTY;  Surgeon: Gregg Car, MD;  Location: [facility];  Service: Orthopedics;  Laterality: Left;   TOTAL KNEE ARTHROPLASTY Right 02/03/2024   Procedure: ARTHROPLASTY, KNEE, TOTAL;  Surgeon: Gregg Jerona GAILS, MD;  Location: [facility];  Service: Orthopedics;  Laterality: Right;   UPPER GASTROINTESTINAL ENDOSCOPY     Patient Active Problem List   Diagnosis Date Noted   Osteoarthritis (arthritis due to wear and tear of joints) 02/03/2024   Left leg weakness  11/03/2023   Osteoarthritis of lumbar spine with myelopathy 11/03/2023   Chronic pain of right knee 09/21/2023   Centrilobular emphysema (HCC) 06/04/2023   Interstitial lung disease (HCC) 06/04/2023   Chronic cough 02/03/2023   Overactive bladder 09/17/2022   Aortic atherosclerosis (HCC) 03/17/2022   Acute insomnia 10/23/2021   Chronic left-sided low back pain with left-sided sciatica 03/05/2021   Unilateral primary osteoarthritis, right knee 01/31/2021   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   Scoliosis deformity of spine 04/22/2019   Generalized anxiety disorder    Panic disorder    Insomnia    Spinal stenosis of lumbar region 11/30/2018   GERD (gastroesophageal reflux disease) 08/14/2017   S/P left TKA 08/11/2016   S/P knee replacement 08/11/2016   OSA (obstructive sleep apnea) 03/22/2014   Snoring 05/09/2013   Essential hypertension 02/11/2013   Dyslipidemia 02/11/2013   Arthritis 02/11/2013    PCP: Gregg Aloysius Schaumann, MD  REFERRING PROVIDER: Maurilio CHRISTELLA Collet, PA-C  REFERRING DIAG: [phone] (ICD-10-CM) - Status post left knee replacement   THERAPY DIAG:  Localized edema  Difficulty in walking, not elsewhere classified  Muscle weakness (generalized)  Stiffness of right knee, not elsewhere classified  Acute pain of right knee  Rationale for Evaluation and Treatment: Rehabilitation  ONSET DATE: February 03, 2024  SUBJECTIVE:   SUBJECTIVE STATEMENT: Doing well; consistent with HEP at home  PERTINENT HISTORY: Arthritis, sciatica, emphysema, HLD, HTN, left TKA, former 40-year smoker  PAIN:  Are you having pain? Yes: NPRS scale: 2.5/10 this week Pain location: Rt knee Pain description: Achy, stiff, discomfort Aggravating factors: Weight-bearing, prolonged postures Relieving factors: 2 Percocet, ice per day and tylenol   PRECAUTIONS: Knee and Back  RED FLAGS: None   WEIGHT BEARING RESTRICTIONS: No  FALLS:  Has patient fallen in last 6  months? No  LIVING ENVIRONMENT: Lives with: lives with their family and lives with their son Lives in: House/apartment Stairs: Is doing stairs with the handrail and a cane Has following equipment at home: Single point cane and Environmental consultant - 2 wheeled  OCCUPATION: Retired PHD  PLOF: Independent  PATIENT GOALS: Be able to weed and garden, do yard work without knee or back restrictions  NEXT MD VISIT: 03/15/2024  OBJECTIVE:  Note: Objective measures were completed at Evaluation unless otherwise noted.  DIAGNOSTIC FINDINGS: Radiographs of the right knee show stable alignment of total knee  arthroplasty hardware.  There is no complicating feature.   PATIENT SURVEYS:  Patient-Specific Activity Scoring Scheme  0 represents "unable to perform." 10 represents "able to perform at prior level. 0 1 2 3 4 5 6 7 8 9  10 (Date and Score)   Activity Eval     1.  Sit on a low seat 5/10    2.  Clean the floor or garden 4/10    3.  Do laundry 4/10   4.  Take shower 6/10   5.  Work on laptop 7/10   Score 5.2     Total score = sum of the activity scores/number of activities Minimum detectable change (90%CI) for average score = 2 points Minimum detectable change (90%CI) for single activity score = 3 points     COGNITION: Overall cognitive status: Within functional limits for tasks assessed     SENSATION: No complaints of peripheral pain or paresthesias  EDEMA:  Noted and not objectively assessed   LOWER EXTREMITY ROM:  Active ROM Left/Right 02/17/2024 Right 02/22/24  Knee flexion 115/88 A: 104 P: 110  Knee extension 0/-13 -9 (seated LAQ)  Ankle dorsiflexion    Ankle plantarflexion    Ankle inversion    Ankle eversion     (Blank rows = not tested)  LOWER EXTREMITY MMT:  MMT Left/Right 02/15/2024   Hip flexion    Hip extension    Hip abduction    Hip adduction    Hip internal rotation    Hip external rotation    Knee flexion    Knee extension 4+/2+   Ankle dorsiflexion     Ankle plantarflexion    Ankle inversion    Ankle eversion     (Blank rows = not tested)  GAIT: Distance walked: 100 feet Assistive device utilized: Walker - 2 wheeled Level of assistance: Complete Independence Comments: Gregg Smith was assistive device free before surgery  TREATMENT DATE:  02/22/24 TherEx SciFit bike L5 x 10 min; seat 11 Seated AA Rt knee flexion 10 x 10 sec hold; LLE providing overpressure Seated LAQ 5# on Rt; 5 sec hold; 2x10 AA heel slides 2x10 reps on Rt knee ROM measurements - see above for details  Self-Care Pt had small area of open incision; had MD look at incision which appears to be healing appropriately.  Did recommend antibiotic ointment PRN as well as shea butter for scar massage to other closed areas.  Pt verbalized understanding  TherAct Leg press bil 100# 3x10; then RLE only 50# 3x10   02/19/24 Scifit bike seat 12-->11 x10 min full rotations for ROM   Knee self flexion stretch 15x3 seconds  HS stretches R 3x30 seconds Piriformis stretches R 3x30 seconds  SAQs 0# 15x3 seconds- 2 rounds  LAQs 0# 12x3 seconds   Patella mobs  OP for knee extension in supine     02/17/2024 Quadriceps sets 10 + 5 + 5 reps for 5 seconds with heel prop Seated tailgate knee flexion 1 minute AAROM right knee flexion 10 x 10 seconds with TKE in between Seated knee extension stretch with foot in chair and armrest used to prevent hip external rotation 3 minutes  97535: Reviewed examination findings; day 1 home exercise program; importance of improving active range of motion with particular emphasis on extension, quadriceps strength and control edema; reviewed X acted outcomes and different points post-surgery and discussed Tegaderm for use when showering to protect the surgical wound.  PATIENT EDUCATION:  Education details: See above Person  educated: Patient Education method: Explanation, Demonstration, Tactile cues, Verbal cues, and Handouts Education comprehension: verbalized understanding, returned demonstration, verbal cues required, tactile cues required, and needs further education  HOME EXERCISE PROGRAM: Access Code: [email or web address] URL: [email or web address] Date: 02/17/2024 Prepared by: Lamar Ivory  Exercises - Supine Quadricep Sets  - 10 x daily - 7 x weekly - 1 sets - 10 reps - 5 second hold - Seated Knee Flexion AAROM  - 5 x daily - 7 x weekly - 1 sets - 10 reps - 10 seconds hold - Seated Passive Knee Extension  - 3 x daily - 7 x weekly - 1 sets - 1 reps - 3 minutes hold  ASSESSMENT:  CLINICAL IMPRESSION: Pt tolerated session well today with continued focus on maximizing ROM and quad activation as well as functional strengthening and activities.  Good improvement in ROM noted today.  Will continue to benefit from PT to maximize function.   EVAL: Patient is a 78 y.o. male who was seen today for physical therapy evaluation and treatment for Z96.652 (ICD-10-CM) - Status post left knee replacement.  Jake has active range of motion, quadriceps strength, edema, pain and functional impairments in need of skilled care.  He was started on an appropriate post TKA rehabilitation protocol with early emphasis on active range of motion (particularly extension), quadriceps strengthening and edema control.  Balance, gait and more advanced functional activities will be completed as appropriate.  OBJECTIVE IMPAIRMENTS: Abnormal gait, decreased activity tolerance, decreased balance, decreased endurance, decreased knowledge of condition, difficulty walking, decreased ROM, decreased strength, increased edema, impaired perceived functional ability, and pain.   ACTIVITY LIMITATIONS: bending, standing, squatting, stairs, and locomotion level  PARTICIPATION LIMITATIONS: cleaning, laundry, driving, community activity, and yard  work  PERSONAL FACTORS: Arthritis, emphysema, HLD, HTN, left TKA, former 40-year smoker are also affecting patient's functional outcome.   REHAB POTENTIAL: Good  CLINICAL DECISION MAKING: Stable/uncomplicated  EVALUATION COMPLEXITY:  Low   GOALS: Goals reviewed with patient? Yes  SHORT TERM GOALS: Target date: 03/16/2024 Quadir will be independent with his day 1 home exercise program Baseline: Started 02/17/2024 Goal status: INITIAL  2.  Improve right knee active range of motion to 0 - 5 - 105 degrees Baseline: 0 - 13 - 88 degrees Goal status: INITIAL  3.  Improve right quadriceps strength as assessed by MMT Baseline: 2+/5 Goal status: INITIAL   LONG TERM GOALS: Target date: 04/13/2024  Improve patient specific functional score to at least 7.2 Baseline: 5.2 Goal status: INITIAL  2.  Onaje will report right knee pain consistently 0-3/10 on the numeric pain rating scale Baseline: 1.5 - 6.5/10 Goal status: INITIAL  3.  Improve right knee active range of motion to 0 - 3 - 110 degrees or better Baseline: 0 - 13 - 88 degrees Goal status: INITIAL  4.  Improve right quadriceps strength as assessed by Malvin's ability to walk without the use of an assistive device and send and descend stairs reciprocally with use of a single handrail Baseline: Unable at evaluation Goal status: INITIAL  5.  Darrick will be independent and compliant with his long-term maintenance home exercise program at discharge Baseline: Started 02/17/2024 Goal status: INITIAL   PLAN:  PT FREQUENCY: 3x/week  PT DURATION: 8 weeks  PLANNED INTERVENTIONS: 97750- Physical Performance Testing, 97110-Therapeutic exercises, 97530- Therapeutic activity, 97112- Neuromuscular re-education, 97535- Self Care, 02859- Manual therapy, [phone]- Gait training, [phone]- Vasopneumatic device, Patient/Family education, Balance training, Stair training, Joint mobilization, and Cryotherapy  PLAN FOR NEXT SESSION:  extension  active range of motion and strength, maximize flextion, manual and edema control PRN, work on hip ER stretches    Corean JULIANNA Ku, PT, DPT 02/22/24 2:52 PM

## 2024-02-24 ENCOUNTER — Encounter: Payer: Self-pay | Admitting: Physical Therapy

## 2024-02-24 ENCOUNTER — Ambulatory Visit: Admitting: Physical Therapy

## 2024-02-24 DIAGNOSIS — M25661 Stiffness of right knee, not elsewhere classified: Secondary | ICD-10-CM | POA: Diagnosis not present

## 2024-02-24 DIAGNOSIS — M25561 Pain in right knee: Secondary | ICD-10-CM

## 2024-02-24 DIAGNOSIS — R600 Localized edema: Secondary | ICD-10-CM | POA: Diagnosis not present

## 2024-02-24 DIAGNOSIS — R262 Difficulty in walking, not elsewhere classified: Secondary | ICD-10-CM | POA: Diagnosis not present

## 2024-02-24 DIAGNOSIS — M6281 Muscle weakness (generalized): Secondary | ICD-10-CM | POA: Diagnosis not present

## 2024-02-24 NOTE — Therapy (Signed)
 OUTPATIENT PHYSICAL THERAPY LOWER EXTREMITY TREATMENT    Patient Name: Gregg Smith MRN: [id number] DOB:[birth year], 78 y.o., male Today's Date: 02/24/2024  END OF SESSION:  PT End of Session - 02/24/24 0806     Visit Number 4    Number of Visits 24    Date for PT Re-Evaluation 04/13/24    Progress Note Due on Visit 10    PT Start Time 0800    Activity Tolerance Patient tolerated treatment well;No increased pain;Patient limited by pain    Behavior During Therapy [facility] for tasks assessed/performed             Past Medical History:  Diagnosis Date   Allergy    seasonal    Anxiety    Arthritis    Barrett's esophagus    Cataract    Chicken pox    Colon polyps    hyperplastic   Diverticulosis    Emphysema lung (HCC)    GERD (gastroesophageal reflux disease)    Hemorrhoids    Hyperlipidemia    Hypertension    ILD (interstitial lung disease) (HCC)    12/18/23 Mild basilar fibrotic interstitial lung disease, 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   Procedure: LEFT TOTAL KNEE ARTHROPLASTY;  Surgeon: Donnice Car, MD;  Location: [facility];  Service: Orthopedics;  Laterality: Left;   TOTAL KNEE ARTHROPLASTY Right 02/03/2024   Procedure: ARTHROPLASTY, KNEE, TOTAL;  Surgeon: Harden Jerona GAILS, MD;  Location: [facility] OR;  Service: Orthopedics;  Laterality: Right;   UPPER GASTROINTESTINAL ENDOSCOPY     Patient Active Problem List   Diagnosis Date Noted   Osteoarthritis (arthritis due to wear and tear of joints) 02/03/2024   Left leg weakness 11/03/2023   Osteoarthritis of lumbar spine with myelopathy  11/03/2023   Chronic pain of right knee 09/21/2023   Centrilobular emphysema (HCC) 06/04/2023   Interstitial lung disease (HCC) 06/04/2023   Chronic cough 02/03/2023   Overactive bladder 09/17/2022   Aortic atherosclerosis (HCC) 03/17/2022   Acute insomnia 10/23/2021   Chronic left-sided low back pain with left-sided sciatica 03/05/2021   Unilateral primary osteoarthritis, right knee 01/31/2021   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   Scoliosis deformity of spine 04/22/2019   Generalized anxiety disorder    Panic disorder    Insomnia    Spinal stenosis of lumbar region 11/30/2018   GERD (gastroesophageal reflux disease) 08/14/2017   S/P left TKA 08/11/2016   S/P knee replacement 08/11/2016   OSA (obstructive sleep apnea) 03/22/2014   Snoring 05/09/2013   Essential hypertension 02/11/2013   Dyslipidemia 02/11/2013   Arthritis 02/11/2013    PCP: Emil Aloysius Schaumann, MD  REFERRING PROVIDER: Maurilio CHRISTELLA Collet, PA-C  REFERRING DIAG: [phone] (ICD-10-CM) - Status post left knee replacement   THERAPY DIAG:  Localized edema  Difficulty in walking, not elsewhere classified  Muscle weakness (generalized)  Stiffness of right knee, not elsewhere classified  Acute pain of right knee  Rationale for Evaluation and Treatment: Rehabilitation  ONSET  DATE: February 03, 2024  SUBJECTIVE:   SUBJECTIVE STATEMENT: Not having a lot of pain but knee is still uncomfortable  PERTINENT HISTORY: Arthritis, sciatica, emphysema, HLD, HTN, left TKA, former 40-year smoker  PAIN:  Are you having pain? Yes: NPRS scale: 2.5/10 this week Pain location: Rt knee Pain description: Achy, stiff, discomfort Aggravating factors: Weight-bearing, prolonged postures Relieving factors: 2 Percocet, ice per day and tylenol   PRECAUTIONS: Knee and Back  RED FLAGS: None   WEIGHT BEARING RESTRICTIONS: No  FALLS:  Has patient fallen in last 6 months? No  LIVING ENVIRONMENT: Lives with:  lives with their family and lives with their son Lives in: House/apartment Stairs: Is doing stairs with the handrail and a cane Has following equipment at home: Single point cane and Environmental consultant - 2 wheeled  OCCUPATION: Retired PHD  PLOF: Independent  PATIENT GOALS: Be able to weed and garden, do yard work without knee or back restrictions  NEXT MD VISIT: 03/15/2024  OBJECTIVE:  Note: Objective measures were completed at Evaluation unless otherwise noted.  DIAGNOSTIC FINDINGS: Radiographs of the right knee show stable alignment of total knee  arthroplasty hardware.  There is no complicating feature.   PATIENT SURVEYS:  Patient-Specific Activity Scoring Scheme  0 represents "unable to perform." 10 represents "able to perform at prior level. 0 1 2 3 4 5 6 7 8 9  10 (Date and Score)   Activity Eval     1.  Sit on a low seat 5/10    2.  Clean the floor or garden 4/10    3.  Do laundry 4/10   4.  Take shower 6/10   5.  Work on laptop 7/10   Score 5.2     Total score = sum of the activity scores/number of activities Minimum detectable change (90%CI) for average score = 2 points Minimum detectable change (90%CI) for single activity score = 3 points     COGNITION: Overall cognitive status: Within functional limits for tasks assessed     SENSATION: No complaints of peripheral pain or paresthesias  EDEMA:  Noted and not objectively assessed   LOWER EXTREMITY ROM:  Active ROM Left/Right 02/17/2024 Right 02/22/24 Right   Knee flexion 115/88 A: 104 P: 110 A: 98  Knee extension 0/-13 -9 (seated LAQ)   Ankle dorsiflexion     Ankle plantarflexion     Ankle inversion     Ankle eversion      (Blank rows = not tested)  LOWER EXTREMITY MMT:  MMT Left/Right 02/15/2024   Hip flexion    Hip extension    Hip abduction    Hip adduction    Hip internal rotation    Hip external rotation    Knee flexion    Knee extension 4+/2+   Ankle dorsiflexion    Ankle plantarflexion     Ankle inversion    Ankle eversion     (Blank rows = not tested)  GAIT: Distance walked: 100 feet Assistive device utilized: Walker - 2 wheeled Level of assistance: Complete Independence Comments: Gregg Smith was assistive device free before surgery  TREATMENT DATE:  02/24/24 TherEx Recumbent bike seat 9 partial revolutions x 5 min; stopped at 5 min at pt's foot kept falling off pedal Seated LAQ 5# on Rt; 5 sec hold; 3x10 Seated quad sets 2x10; 5 sec hold with visual cues for quad activation Seated SLR 2x10; 3 sec hold AA heel slides 2x01 Seated hamstring stretch 3x30 sec with overpressure at distal thigh Heel prop x 3 min at beginning of vaso for knee extension  TherAct Leg press bil 100# 3x10; then RLE only 50# 3x10  Modalities Vaso x 10 min; mod pressure in elevation to Rt knee; 34 deg  02/22/24 TherEx SciFit bike L5 x 10 min; seat 11 Seated AA Rt knee flexion 10 x 10 sec hold; LLE providing overpressure Seated LAQ 5# on Rt; 5 sec hold; 2x10 AA heel slides 2x10 reps on Rt knee ROM measurements - see above for details  Self-Care Pt had small area of open incision; had MD look at incision which appears to be healing appropriately.  Did recommend antibiotic ointment PRN as well as shea butter for scar massage to other closed areas.  Pt verbalized understanding  TherAct Leg press bil 100# 3x10; then RLE only 50# 3x10   02/19/24 Scifit bike seat 12-->11 x10 min full rotations for ROM   Knee self flexion stretch 15x3 seconds  HS stretches R 3x30 seconds Piriformis stretches R 3x30 seconds  SAQs 0# 15x3 seconds- 2 rounds  LAQs 0# 12x3 seconds   Patella mobs  OP for knee extension in supine     02/17/2024 Quadriceps sets 10 + 5 + 5 reps for 5 seconds with heel prop Seated tailgate knee flexion 1 minute AAROM right knee flexion 10 x 10 seconds  with TKE in between Seated knee extension stretch with foot in chair and armrest used to prevent hip external rotation 3 minutes  97535: Reviewed examination findings; day 1 home exercise program; importance of improving active range of motion with particular emphasis on extension, quadriceps strength and control edema; reviewed X acted outcomes and different points post-surgery and discussed Tegaderm for use when showering to protect the surgical wound.  PATIENT EDUCATION:  Education details: See above Person educated: Patient Education method: Explanation, Demonstration, Tactile cues, Verbal cues, and Handouts Education comprehension: verbalized understanding, returned demonstration, verbal cues required, tactile cues required, and needs further education  HOME EXERCISE PROGRAM: Access Code: [email or web address] URL: [email or web address] Date: 02/17/2024 Prepared by: Lamar Ivory  Exercises - Supine Quadricep Sets  - 10 x daily - 7 x weekly - 1 sets - 10 reps - 5 second hold - Seated Knee Flexion AAROM  - 5 x daily - 7 x weekly - 1 sets - 10 reps - 10 seconds hold - Seated Passive Knee Extension  - 3 x daily - 7 x weekly - 1 sets - 1 reps - 3 minutes hold  ASSESSMENT:  CLINICAL IMPRESSION: Pt with slight decrease in flexion today but session earlier in day compared to prior session.  Overall progressing well with PT, and still lacking knee extension ROM and strength.  Will continue to benefit from PT to maximize function.   EVAL: Patient is a 78 y.o. male who was seen today for physical therapy evaluation and treatment for Z96.652 (ICD-10-CM) - Status post left knee replacement.  Gardy has active range of motion, quadriceps strength, edema, pain and functional impairments in need of skilled care.  He was started on an appropriate post TKA rehabilitation protocol with early emphasis on active range  of motion (particularly extension), quadriceps strengthening and edema control.   Balance, gait and more advanced functional activities will be completed as appropriate.  OBJECTIVE IMPAIRMENTS: Abnormal gait, decreased activity tolerance, decreased balance, decreased endurance, decreased knowledge of condition, difficulty walking, decreased ROM, decreased strength, increased edema, impaired perceived functional ability, and pain.   ACTIVITY LIMITATIONS: bending, standing, squatting, stairs, and locomotion level  PARTICIPATION LIMITATIONS: cleaning, laundry, driving, community activity, and yard work  PERSONAL FACTORS: Arthritis, emphysema, HLD, HTN, left TKA, former 40-year smoker are also affecting patient's functional outcome.   REHAB POTENTIAL: Good  CLINICAL DECISION MAKING: Stable/uncomplicated  EVALUATION COMPLEXITY: Low   GOALS: Goals reviewed with patient? Yes  SHORT TERM GOALS: Target date: 03/16/2024 Gregg Smith will be independent with his day 1 home exercise program Baseline: Started 02/17/2024 Goal status: INITIAL  2.  Improve right knee active range of motion to 0 - 5 - 105 degrees Baseline: 0 - 13 - 88 degrees Goal status: INITIAL  3.  Improve right quadriceps strength as assessed by MMT Baseline: 2+/5 Goal status: INITIAL   LONG TERM GOALS: Target date: 04/13/2024  Improve patient specific functional score to at least 7.2 Baseline: 5.2 Goal status: INITIAL  2.  Dontarious will report right knee pain consistently 0-3/10 on the numeric pain rating scale Baseline: 1.5 - 6.5/10 Goal status: INITIAL  3.  Improve right knee active range of motion to 0 - 3 - 110 degrees or better Baseline: 0 - 13 - 88 degrees Goal status: INITIAL  4.  Improve right quadriceps strength as assessed by Gregg Smith's ability to walk without the use of an assistive device and send and descend stairs reciprocally with use of a single handrail Baseline: Unable at evaluation Goal status: INITIAL  5.  Gregg Smith will be independent and compliant with his long-term maintenance home  exercise program at discharge Baseline: Started 02/17/2024 Goal status: INITIAL   PLAN:  PT FREQUENCY: 3x/week  PT DURATION: 8 weeks  PLANNED INTERVENTIONS: 97750- Physical Performance Testing, 97110-Therapeutic exercises, 97530- Therapeutic activity, 97112- Neuromuscular re-education, 97535- Self Care, 02859- Manual therapy, [phone]- Gait training, [phone]- Vasopneumatic device, Patient/Family education, Balance training, Stair training, Joint mobilization, and Cryotherapy  PLAN FOR NEXT SESSION: continue extension active range of motion and strength, maximize flextion, manual and edema control PRN, work on hip ER stretches    Gregg Smith, PT, DPT 02/24/24 8:06 AM

## 2024-02-26 ENCOUNTER — Ambulatory Visit: Payer: Self-pay | Admitting: Physical Therapy

## 2024-02-26 ENCOUNTER — Encounter: Payer: Self-pay | Admitting: Physical Therapy

## 2024-02-26 DIAGNOSIS — R600 Localized edema: Secondary | ICD-10-CM | POA: Diagnosis not present

## 2024-02-26 DIAGNOSIS — M25561 Pain in right knee: Secondary | ICD-10-CM | POA: Diagnosis not present

## 2024-02-26 DIAGNOSIS — R262 Difficulty in walking, not elsewhere classified: Secondary | ICD-10-CM

## 2024-02-26 DIAGNOSIS — M6281 Muscle weakness (generalized): Secondary | ICD-10-CM | POA: Diagnosis not present

## 2024-02-26 DIAGNOSIS — M25661 Stiffness of right knee, not elsewhere classified: Secondary | ICD-10-CM

## 2024-02-26 NOTE — Therapy (Signed)
 OUTPATIENT PHYSICAL THERAPY LOWER EXTREMITY TREATMENT    Patient Name: Gregg Smith MRN: [id number] DOB:09-09-1945, 78 y.o., male Today's Date: 02/26/2024  END OF SESSION:  PT End of Session - 02/26/24 1145     Visit Number 5    Number of Visits 24    Date for PT Re-Evaluation 04/13/24    Progress Note Due on Visit 10    PT Start Time 1145    PT Stop Time 1225    PT Time Calculation (min) 40 min    Activity Tolerance Patient tolerated treatment well;No increased pain;Patient limited by pain    Behavior During Therapy [facility] for tasks assessed/performed           Past Medical History:  Diagnosis Date   Allergy    seasonal    Anxiety    Arthritis    Barrett's esophagus    Cataract    Chicken pox    Colon polyps    hyperplastic   Diverticulosis    Emphysema lung (HCC)    GERD (gastroesophageal reflux disease)    Hemorrhoids    Hyperlipidemia    Hypertension    ILD (interstitial lung disease) (HCC)    12/18/23 Mild basilar fibrotic interstitial lung disease, 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   Procedure: LEFT TOTAL KNEE ARTHROPLASTY;  Surgeon: Donnice Car, MD;  Location: [facility];  Service: Orthopedics;  Laterality: Left;   TOTAL KNEE ARTHROPLASTY Right 02/03/2024   Procedure: ARTHROPLASTY, KNEE, TOTAL;  Surgeon: Harden Jerona GAILS, MD;  Location: [facility];  Service: Orthopedics;  Laterality: Right;   UPPER GASTROINTESTINAL ENDOSCOPY     Patient Active Problem List   Diagnosis Date Noted   Osteoarthritis (arthritis due to wear and tear of joints) 02/03/2024   Left leg weakness 11/03/2023    Osteoarthritis of lumbar spine with myelopathy 11/03/2023   Chronic pain of right knee 09/21/2023   Centrilobular emphysema (HCC) 06/04/2023   Interstitial lung disease (HCC) 06/04/2023   Chronic cough 02/03/2023   Overactive bladder 09/17/2022   Aortic atherosclerosis (HCC) 03/17/2022   Acute insomnia 10/23/2021   Chronic left-sided low back pain with left-sided sciatica 03/05/2021   Unilateral primary osteoarthritis, right knee 01/31/2021   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   Scoliosis deformity of spine 04/22/2019   Generalized anxiety disorder    Panic disorder    Insomnia    Spinal stenosis of lumbar region 11/30/2018   GERD (gastroesophageal reflux disease) 08/14/2017   S/P left TKA 08/11/2016   S/P knee replacement 08/11/2016   OSA (obstructive sleep apnea) 03/22/2014   Snoring 05/09/2013   Essential hypertension 02/11/2013   Dyslipidemia 02/11/2013   Arthritis 02/11/2013    PCP: Emil Aloysius Schaumann, MD  REFERRING PROVIDER: Maurilio CHRISTELLA Collet, PA-C  REFERRING DIAG: [phone] (ICD-10-CM) - Status post left knee replacement   THERAPY DIAG:  Localized edema  Difficulty in walking, not elsewhere classified  Muscle weakness (generalized)  Stiffness of right knee, not elsewhere classified  Acute pain of right knee  Rationale for Evaluation and Treatment: Rehabilitation  ONSET DATE: February 03, 2024  SUBJECTIVE:   SUBJECTIVE STATEMENT: Pt reports is still using the cane all day. Has been taking tylenol  and thinking about reducing his percocet.   PERTINENT HISTORY: Arthritis, sciatica, emphysema, HLD, HTN, left TKA (~6 years ago), former 40-year smoker  PAIN:  Are you having pain? Yes: NPRS scale: 3/10 this week Pain location: Rt knee Pain description: Achy, stiff, discomfort Aggravating factors: Weight-bearing, prolonged postures Relieving factors: 2 Percocet, ice per day and tylenol   PRECAUTIONS: Knee and Back  RED  FLAGS: None   WEIGHT BEARING RESTRICTIONS: No  FALLS:  Has patient fallen in last 6 months? No  LIVING ENVIRONMENT: Lives with: lives with their family and lives with their son Lives in: House/apartment Stairs: Is doing stairs with the handrail and a cane Has following equipment at home: Single point cane and Environmental consultant - 2 wheeled  OCCUPATION: Retired PHD  PLOF: Independent  PATIENT GOALS: Be able to weed and garden, do yard work without knee or back restrictions  NEXT MD VISIT: 03/15/2024  OBJECTIVE:  Note: Objective measures were completed at Evaluation unless otherwise noted.  DIAGNOSTIC FINDINGS: Radiographs of the right knee show stable alignment of total knee  arthroplasty hardware.  There is no complicating feature.   PATIENT SURVEYS:  Patient-Specific Activity Scoring Scheme  0 represents "unable to perform." 10 represents "able to perform at prior level. 0 1 2 3 4 5 6 7 8 9  10 (Date and Score)   Activity Eval     1.  Sit on a low seat 5/10    2.  Clean the floor or garden 4/10    3.  Do laundry 4/10   4.  Take shower 6/10   5.  Work on laptop 7/10   Score 5.2     Total score = sum of the activity scores/number of activities Minimum detectable change (90%CI) for average score = 2 points Minimum detectable change (90%CI) for single activity score = 3 points     COGNITION: Overall cognitive status: Within functional limits for tasks assessed     SENSATION: No complaints of peripheral pain or paresthesias  EDEMA:  Noted and not objectively assessed   LOWER EXTREMITY ROM:  Active ROM Left/Right 02/17/2024 Right 02/22/24 Right   Knee flexion 115/88 A: 104 P: 110 A: 98  Knee extension 0/-13 -9 (seated LAQ)   Ankle dorsiflexion     Ankle plantarflexion     Ankle inversion     Ankle eversion      (Blank rows = not tested)  LOWER EXTREMITY MMT:  MMT Left/Right 02/15/2024   Hip flexion    Hip extension    Hip abduction    Hip adduction    Hip  internal rotation    Hip external rotation    Knee flexion    Knee extension 4+/2+   Ankle dorsiflexion    Ankle plantarflexion    Ankle inversion    Ankle eversion     (Blank rows = not tested)  GAIT: Distance walked: 100 feet Assistive device utilized: Walker - 2 wheeled Level of assistance: Complete Independence Comments: Gregg Smith was assistive device free before surgery  TREATMENT DATE:  02/26/24 TherEx Scifit bike L1; seat 11, 5 min fwd, 3 min bwd Elevated seat knee flexion self stretch 10x10 Elevated seat hamstring curl end range 2x10x3 Seated hamstring stretch 2x 30 Seated quad set 2x10x5 Seated LAQ 5# on Rt; 5 sec hold; 2x10  TherAct Standing heel/toe raise x10 Standing slow march 2x10 Standing hip abd 2x10 Standing hip ext 2x10  Modalities Vaso x 10 min; mod pressure in elevation to Rt knee; 34 deg  02/24/24 TherEx Recumbent bike seat 9 partial revolutions x 5 min; stopped at 5 min at pt's foot kept falling off pedal Seated LAQ 5# on Rt; 5 sec hold; 3x10 Seated quad sets 2x10; 5 sec hold with visual cues for quad activation Seated SLR 2x10; 3 sec hold AA heel slides 2x01 Seated hamstring stretch 3x30 sec with overpressure at distal thigh Heel prop x 3 min at beginning of vaso for knee extension  TherAct Leg press bil 100# 3x10; then RLE only 50# 3x10  Modalities Vaso x 10 min; mod pressure in elevation to Rt knee; 34 deg  02/22/24 TherEx SciFit bike L5 x 10 min; seat 11 Seated AA Rt knee flexion 10 x 10 sec hold; LLE providing overpressure Seated LAQ 5# on Rt; 5 sec hold; 2x10 AA heel slides 2x10 reps on Rt knee ROM measurements - see above for details  Self-Care Pt had small area of open incision; had MD look at incision which appears to be healing appropriately.  Did recommend antibiotic ointment PRN as well as shea  butter for scar massage to other closed areas.  Pt verbalized understanding  TherAct Leg press bil 100# 3x10; then RLE only 50# 3x10   02/19/24 Scifit bike seat 12-->11 x10 min full rotations for ROM   Knee self flexion stretch 15x3 seconds  HS stretches R 3x30 seconds Piriformis stretches R 3x30 seconds  SAQs 0# 15x3 seconds- 2 rounds  LAQs 0# 12x3 seconds   Patella mobs  OP for knee extension in supine     02/17/2024 Quadriceps sets 10 + 5 + 5 reps for 5 seconds with heel prop Seated tailgate knee flexion 1 minute AAROM right knee flexion 10 x 10 seconds with TKE in between Seated knee extension stretch with foot in chair and armrest used to prevent hip external rotation 3 minutes  97535: Reviewed examination findings; day 1 home exercise program; importance of improving active range of motion with particular emphasis on extension, quadriceps strength and control edema; reviewed X acted outcomes and different points post-surgery and discussed Tegaderm for use when showering to protect the surgical wound.  PATIENT EDUCATION:  Education details: See above Person educated: Patient Education method: Explanation, Demonstration, Tactile cues, Verbal cues, and Handouts Education comprehension: verbalized understanding, returned demonstration, verbal cues required, tactile cues required, and needs further education  HOME EXERCISE PROGRAM: Access Code: [email or web address] URL: [email or web address] Date: 02/17/2024 Prepared by: Lamar Ivory  Exercises - Supine Quadricep Sets  - 10 x daily - 7 x weekly - 1 sets - 10 reps - 5 second hold - Seated Knee Flexion AAROM  - 5 x daily - 7 x weekly - 1 sets - 10 reps - 10 seconds hold - Seated Passive Knee Extension  - 3 x daily - 7 x weekly - 1 sets - 1 reps - 3 minutes hold  ASSESSMENT:  CLINICAL IMPRESSION: Continued to work on knee flexion/extension. Provided standing exercises today to work on increasing R LE weight shift and  weight bearing with gross LE  strengthening.    EVAL: Patient is a 78 y.o. male who was seen today for physical therapy evaluation and treatment for Z96.652 (ICD-10-CM) - Status post left knee replacement.  Gregg Smith has active range of motion, quadriceps strength, edema, pain and functional impairments in need of skilled care.  He was started on an appropriate post TKA rehabilitation protocol with early emphasis on active range of motion (particularly extension), quadriceps strengthening and edema control.  Balance, gait and more advanced functional activities will be completed as appropriate.  OBJECTIVE IMPAIRMENTS: Abnormal gait, decreased activity tolerance, decreased balance, decreased endurance, decreased knowledge of condition, difficulty walking, decreased ROM, decreased strength, increased edema, impaired perceived functional ability, and pain.   ACTIVITY LIMITATIONS: bending, standing, squatting, stairs, and locomotion level  PARTICIPATION LIMITATIONS: cleaning, laundry, driving, community activity, and yard work  PERSONAL FACTORS: Arthritis, emphysema, HLD, HTN, left TKA, former 40-year smoker are also affecting patient's functional outcome.   REHAB POTENTIAL: Good  CLINICAL DECISION MAKING: Stable/uncomplicated  EVALUATION COMPLEXITY: Low   GOALS: Goals reviewed with patient? Yes  SHORT TERM GOALS: Target date: 03/16/2024 Gregg Smith will be independent with his day 1 home exercise program Baseline: Started 02/17/2024 Goal status: INITIAL  2.  Improve right knee active range of motion to 0 - 5 - 105 degrees Baseline: 0 - 13 - 88 degrees Goal status: INITIAL  3.  Improve right quadriceps strength as assessed by MMT Baseline: 2+/5 Goal status: INITIAL   LONG TERM GOALS: Target date: 04/13/2024  Improve patient specific functional score to at least 7.2 Baseline: 5.2 Goal status: INITIAL  2.  Gregg Smith will report right knee pain consistently 0-3/10 on the numeric pain rating  scale Baseline: 1.5 - 6.5/10 Goal status: INITIAL  3.  Improve right knee active range of motion to 0 - 3 - 110 degrees or better Baseline: 0 - 13 - 88 degrees Goal status: INITIAL  4.  Improve right quadriceps strength as assessed by Gregg Smith ability to walk without the use of an assistive device and send and descend stairs reciprocally with use of a single handrail Baseline: Unable at evaluation Goal status: INITIAL  5.  Gregg Smith will be independent and compliant with his long-term maintenance home exercise program at discharge Baseline: Started 02/17/2024 Goal status: INITIAL   PLAN:  PT FREQUENCY: 3x/week  PT DURATION: 8 weeks  PLANNED INTERVENTIONS: 97750- Physical Performance Testing, 97110-Therapeutic exercises, 97530- Therapeutic activity, 97112- Neuromuscular re-education, 97535- Self Care, 02859- Manual therapy, [phone]- Gait training, [phone]- Vasopneumatic device, Patient/Family education, Balance training, Stair training, Joint mobilization, and Cryotherapy  PLAN FOR NEXT SESSION: continue extension active range of motion and strength, maximize flexion, manual and edema control PRN, work on hip ER stretches    Kipton Skillen April Ma L Lincoln, PT, DPT 02/26/24 11:45 AM

## 2024-02-29 ENCOUNTER — Encounter: Payer: Self-pay | Admitting: Physical Therapy

## 2024-02-29 ENCOUNTER — Ambulatory Visit: Payer: Self-pay | Admitting: Physical Therapy

## 2024-02-29 DIAGNOSIS — M25561 Pain in right knee: Secondary | ICD-10-CM

## 2024-02-29 DIAGNOSIS — M6281 Muscle weakness (generalized): Secondary | ICD-10-CM | POA: Diagnosis not present

## 2024-02-29 DIAGNOSIS — R600 Localized edema: Secondary | ICD-10-CM | POA: Diagnosis not present

## 2024-02-29 DIAGNOSIS — M25661 Stiffness of right knee, not elsewhere classified: Secondary | ICD-10-CM | POA: Diagnosis not present

## 2024-02-29 DIAGNOSIS — R262 Difficulty in walking, not elsewhere classified: Secondary | ICD-10-CM

## 2024-02-29 NOTE — Therapy (Signed)
 OUTPATIENT PHYSICAL THERAPY LOWER EXTREMITY TREATMENT    Patient Name: Gregg Smith MRN: [id number] DOB:12-11-1945, 78 y.o., male Today's Date: 02/29/2024  END OF SESSION:  PT End of Session - 02/29/24 1610     Visit Number 6    Number of Visits 24    Date for PT Re-Evaluation 04/13/24    Progress Note Due on Visit 10    PT Start Time 1520    PT Stop Time 1615    PT Time Calculation (min) 55 min    Activity Tolerance Patient tolerated treatment well;Patient limited by pain    Behavior During Therapy WFL for tasks assessed/performed            Past Medical History:  Diagnosis Date   Allergy    seasonal    Anxiety    Arthritis    Barrett's esophagus    Cataract    Chicken pox    Colon polyps    hyperplastic   Diverticulosis    Emphysema lung (HCC)    GERD (gastroesophageal reflux disease)    Hemorrhoids    Hyperlipidemia    Hypertension    ILD (interstitial lung disease) (HCC)    12/18/23 Mild basilar fibrotic interstitial lung disease, 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   Procedure: LEFT TOTAL KNEE ARTHROPLASTY;  Surgeon: Donnice Car, MD;  Location: [facility];  Service: Orthopedics;  Laterality: Left;   TOTAL KNEE ARTHROPLASTY Right 02/03/2024   Procedure: ARTHROPLASTY, KNEE, TOTAL;  Surgeon: Harden Jerona GAILS, MD;  Location: [facility] OR;  Service: Orthopedics;  Laterality: Right;   UPPER GASTROINTESTINAL ENDOSCOPY     Patient Active Problem List   Diagnosis Date Noted   Osteoarthritis (arthritis due to wear and tear of joints) 02/03/2024   Left leg weakness 11/03/2023    Osteoarthritis of lumbar spine with myelopathy 11/03/2023   Chronic pain of right knee 09/21/2023   Centrilobular emphysema (HCC) 06/04/2023   Interstitial lung disease (HCC) 06/04/2023   Chronic cough 02/03/2023   Overactive bladder 09/17/2022   Aortic atherosclerosis (HCC) 03/17/2022   Acute insomnia 10/23/2021   Chronic left-sided low back pain with left-sided sciatica 03/05/2021   Unilateral primary osteoarthritis, right knee 01/31/2021   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   Scoliosis deformity of spine 04/22/2019   Generalized anxiety disorder    Panic disorder    Insomnia    Spinal stenosis of lumbar region 11/30/2018   GERD (gastroesophageal reflux disease) 08/14/2017   S/P left TKA 08/11/2016   S/P knee replacement 08/11/2016   OSA (obstructive sleep apnea) 03/22/2014   Snoring 05/09/2013   Essential hypertension 02/11/2013   Dyslipidemia 02/11/2013   Arthritis 02/11/2013    PCP: Emil Aloysius Schaumann, MD  REFERRING PROVIDER: Maurilio CHRISTELLA Collet, PA-C  REFERRING DIAG: [phone] (ICD-10-CM) - Status post left knee replacement   THERAPY DIAG:  Localized edema  Difficulty in walking, not elsewhere classified  Muscle weakness (generalized)  Stiffness of right knee, not elsewhere classified  Acute  pain of right knee  Rationale for Evaluation and Treatment: Rehabilitation  ONSET DATE: February 03, 2024  SUBJECTIVE:   SUBJECTIVE STATEMENT: Pt reporting he forgot to take his Tylenol  prior to his session today.   PERTINENT HISTORY: Arthritis, sciatica, emphysema, HLD, HTN, left TKA (~6 years ago), former 40-year smoker  PAIN:  Are you having pain? Yes: NPRS scale: 3.5/10 this week Pain location: Rt knee Pain description: Achy, stiff, discomfort Aggravating factors: Weight-bearing, prolonged postures Relieving factors: 2 Percocet, ice per day and tylenol   PRECAUTIONS: Knee and Back  RED FLAGS: None   WEIGHT BEARING RESTRICTIONS: No  FALLS:   Has patient fallen in last 6 months? No  LIVING ENVIRONMENT: Lives with: lives with their family and lives with their son Lives in: House/apartment Stairs: Is doing stairs with the handrail and a cane Has following equipment at home: Single point cane and Environmental consultant - 2 wheeled  OCCUPATION: Retired PHD  PLOF: Independent  PATIENT GOALS: Be able to weed and garden, do yard work without knee or back restrictions  NEXT MD VISIT: 03/15/2024  OBJECTIVE:  Note: Objective measures were completed at Evaluation unless otherwise noted.  DIAGNOSTIC FINDINGS: Radiographs of the right knee show stable alignment of total knee  arthroplasty hardware.  There is no complicating feature.   PATIENT SURVEYS:  Patient-Specific Activity Scoring Scheme  0 represents "unable to perform." 10 represents "able to perform at prior level. 0 1 2 3 4 5 6 7 8 9  10 (Date and Score)   Activity Eval     1.  Sit on a low seat 5/10    2.  Clean the floor or garden 4/10    3.  Do laundry 4/10   4.  Take shower 6/10   5.  Work on laptop 7/10   Score 5.2     Total score = sum of the activity scores/number of activities Minimum detectable change (90%CI) for average score = 2 points Minimum detectable change (90%CI) for single activity score = 3 points     COGNITION: Overall cognitive status: Within functional limits for tasks assessed     SENSATION: No complaints of peripheral pain or paresthesias  EDEMA:  Noted and not objectively assessed   LOWER EXTREMITY ROM:  Active ROM Left/Right 02/17/2024 Right 02/22/24 Right  Rt 02/29/24  Knee flexion 115/88 A: 104 P: 110 A: 98 AA: 108  Knee extension 0/-13 -9 (seated LAQ)  AA: -12  Ankle dorsiflexion      Ankle plantarflexion      Ankle inversion      Ankle eversion       (Blank rows = not tested)  LOWER EXTREMITY MMT:  MMT Left/Right 02/15/2024   Hip flexion    Hip extension    Hip abduction    Hip adduction    Hip internal rotation    Hip  external rotation    Knee flexion    Knee extension 4+/2+   Ankle dorsiflexion    Ankle plantarflexion    Ankle inversion    Ankle eversion     (Blank rows = not tested)  GAIT: Distance walked: 100 feet Assistive device utilized: Walker - 2 wheeled Level of assistance: Complete Independence Comments: Ranferi was assistive device free before surgery  TREATMENT DATE:  02/29/24:  TherEx Recumbent bike: level 4 seat 9, x 8 minutes Calf stretch on slant board x 3 holding 30 sec LAQ 4 # 2 x 10  Heel slides x 5 in supine Quad sets with overpressure x 5 hold 5 seconds TherActivites:  Double Leg Press: 87# 3 x 10  Rt single leg press: 43# 2 x 10  Sit to stand: x 10 mat at 20 inches Manual:  Contract/relax in sitting, contralateral LE performing opposite motion Modalities:  Vasopneuamtic: 34 deg x 10 minutes, medium compression, LE elevated     02/26/24 TherEx Scifit bike L1; seat 11, 5 min fwd, 3 min bwd Elevated seat knee flexion self stretch 10x10 Elevated seat hamstring curl end range 2x10x3 Seated hamstring stretch 2x 30 Seated quad set 2x10x5 Seated LAQ 5# on Rt; 5 sec hold; 2x10  TherAct Standing heel/toe raise x10 Standing slow march 2x10 Standing hip abd 2x10 Standing hip ext 2x10  Modalities Vaso x 10 min; mod pressure in elevation to Rt knee; 34 deg    02/24/24 TherEx Recumbent bike seat 9 partial revolutions x 5 min; stopped at 5 min at pt's foot kept falling off pedal Seated LAQ 5# on Rt; 5 sec hold; 3x10 Seated quad sets 2x10; 5 sec hold with visual cues for quad activation Seated SLR 2x10; 3 sec hold AA heel slides 2x01 Seated hamstring stretch 3x30 sec with overpressure at distal thigh Heel prop x 3 min at beginning of vaso for knee extension  TherAct Leg press bil 100# 3x10; then RLE only 50# 3x10  Modalities Vaso x  10 min; mod pressure in elevation to Rt knee; 34 deg     PATIENT EDUCATION:  Education details: See above Person educated: Patient Education method: Explanation, Demonstration, Tactile cues, Verbal cues, and Handouts Education comprehension: verbalized understanding, returned demonstration, verbal cues required, tactile cues required, and needs further education  HOME EXERCISE PROGRAM: Access Code: [email or web address] URL: [email or web address] Date: 02/17/2024 Prepared by: Lamar Ivory  Exercises - Supine Quadricep Sets  - 10 x daily - 7 x weekly - 1 sets - 10 reps - 5 second hold - Seated Knee Flexion AAROM  - 5 x daily - 7 x weekly - 1 sets - 10 reps - 10 seconds hold - Seated Passive Knee Extension  - 3 x daily - 7 x weekly - 1 sets - 1 reps - 3 minutes hold  ASSESSMENT:  CLINICAL IMPRESSION: Pt arriving to therapy reporting 3.5/10 pain in his Rt knee with no over the counter pain meds. Treatment today focusing on strengthening and ROM. Pt's right knee active ROM arc is 12 to 108 degrees. Recommending continuation of skilled PT to maximize pt's function.    EVAL: Patient is a 78 y.o. male who was seen today for physical therapy evaluation and treatment for Z96.652 (ICD-10-CM) - Status post left knee replacement.  Shmiel has active range of motion, quadriceps strength, edema, pain and functional impairments in need of skilled care.  He was started on an appropriate post TKA rehabilitation protocol with early emphasis on active range of motion (particularly extension), quadriceps strengthening and edema control.  Balance, gait and more advanced functional activities will be completed as appropriate.  OBJECTIVE IMPAIRMENTS: Abnormal gait, decreased activity tolerance, decreased balance, decreased endurance, decreased knowledge of condition, difficulty walking, decreased ROM, decreased strength, increased edema, impaired perceived functional ability, and pain.   ACTIVITY  LIMITATIONS: bending, standing, squatting, stairs, and locomotion level  PARTICIPATION LIMITATIONS: cleaning, laundry, driving, community  activity, and yard work  PERSONAL FACTORS: Arthritis, emphysema, HLD, HTN, left TKA, former 40-year smoker are also affecting patient's functional outcome.   REHAB POTENTIAL: Good  CLINICAL DECISION MAKING: Stable/uncomplicated  EVALUATION COMPLEXITY: Low   GOALS: Goals reviewed with patient? Yes  SHORT TERM GOALS: Target date: 03/16/2024 Mainor will be independent with his day 1 home exercise program Baseline: Started 02/17/2024 Goal status: On-going 02/29/24  2.  Improve right knee active range of motion to 0 - 5 - 105 degrees Baseline: 0 - 13 - 88 degrees Goal status: on-going 02/29/24  3.  Improve right quadriceps strength as assessed by MMT Baseline: 2+/5 Goal status: on-going 02/29/24   LONG TERM GOALS: Target date: 04/13/2024  Improve patient specific functional score to at least 7.2 Baseline: 5.2 Goal status: INITIAL  2.  Kemo will report right knee pain consistently 0-3/10 on the numeric pain rating scale Baseline: 1.5 - 6.5/10 Goal status: INITIAL  3.  Improve right knee active range of motion to 0 - 3 - 110 degrees or better Baseline: 0 - 13 - 88 degrees Goal status: INITIAL  4.  Improve right quadriceps strength as assessed by Bernal's ability to walk without the use of an assistive device and send and descend stairs reciprocally with use of a single handrail Baseline: Unable at evaluation Goal status: INITIAL  5.  Jona will be independent and compliant with his long-term maintenance home exercise program at discharge Baseline: Started 02/17/2024 Goal status: INITIAL   PLAN:  PT FREQUENCY: 3x/week  PT DURATION: 8 weeks  PLANNED INTERVENTIONS: 97750- Physical Performance Testing, 97110-Therapeutic exercises, 97530- Therapeutic activity, 97112- Neuromuscular re-education, 97535- Self Care, 02859- Manual therapy,  [phone]- Gait training, [phone]- Vasopneumatic device, Patient/Family education, Balance training, Stair training, Joint mobilization, and Cryotherapy  PLAN FOR NEXT SESSION: continue extension active range of motion and strength, maximize flexion, manual and edema control PRN, work on hip ER stretches    Delon JONELLE Lunger, PT, MPT 02/29/24 4:17 PM

## 2024-03-02 ENCOUNTER — Encounter: Payer: Self-pay | Admitting: Rehabilitative and Restorative Service Providers"

## 2024-03-02 ENCOUNTER — Ambulatory Visit: Payer: Self-pay | Admitting: Rehabilitative and Restorative Service Providers"

## 2024-03-02 DIAGNOSIS — R262 Difficulty in walking, not elsewhere classified: Secondary | ICD-10-CM

## 2024-03-02 DIAGNOSIS — M25561 Pain in right knee: Secondary | ICD-10-CM | POA: Diagnosis not present

## 2024-03-02 DIAGNOSIS — M6281 Muscle weakness (generalized): Secondary | ICD-10-CM

## 2024-03-02 DIAGNOSIS — R600 Localized edema: Secondary | ICD-10-CM | POA: Diagnosis not present

## 2024-03-02 DIAGNOSIS — M25661 Stiffness of right knee, not elsewhere classified: Secondary | ICD-10-CM

## 2024-03-02 NOTE — Therapy (Signed)
 OUTPATIENT PHYSICAL THERAPY LOWER EXTREMITY TREATMENT    Patient Name: KARTHIK WHITTINGHILL MRN: [id number] DOB:1946-06-15, 78 y.o., male Today's Date: 03/02/2024  END OF SESSION:  PT End of Session - 03/02/24 0849     Visit Number 7    Number of Visits 24    Date for PT Re-Evaluation 04/13/24    Progress Note Due on Visit 10    PT Start Time 0848    PT Stop Time 0940    PT Time Calculation (min) 52 min    Activity Tolerance Patient tolerated treatment well;No increased pain    Behavior During Therapy WFL for tasks assessed/performed             Past Medical History:  Diagnosis Date   Allergy    seasonal    Anxiety    Arthritis    Barrett's esophagus    Cataract    Chicken pox    Colon polyps    hyperplastic   Diverticulosis    Emphysema lung (HCC)    GERD (gastroesophageal reflux disease)    Hemorrhoids    Hyperlipidemia    Hypertension    ILD (interstitial lung disease) (HCC)    12/18/23 Mild basilar fibrotic interstitial lung disease, 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   Procedure: LEFT TOTAL KNEE ARTHROPLASTY;  Surgeon: Donnice Car, MD;  Location: [facility];  Service: Orthopedics;  Laterality: Left;   TOTAL KNEE ARTHROPLASTY Right 02/03/2024   Procedure: ARTHROPLASTY, KNEE, TOTAL;  Surgeon: Harden Jerona GAILS, MD;  Location: [facility] OR;  Service: Orthopedics;  Laterality: Right;   UPPER GASTROINTESTINAL ENDOSCOPY     Patient Active Problem List   Diagnosis Date Noted   Osteoarthritis (arthritis due to wear and tear of joints) 02/03/2024   Left leg weakness 11/03/2023   Osteoarthritis of  lumbar spine with myelopathy 11/03/2023   Chronic pain of right knee 09/21/2023   Centrilobular emphysema (HCC) 06/04/2023   Interstitial lung disease (HCC) 06/04/2023   Chronic cough 02/03/2023   Overactive bladder 09/17/2022   Aortic atherosclerosis (HCC) 03/17/2022   Acute insomnia 10/23/2021   Chronic left-sided low back pain with left-sided sciatica 03/05/2021   Unilateral primary osteoarthritis, right knee 01/31/2021   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   Scoliosis deformity of spine 04/22/2019   Generalized anxiety disorder    Panic disorder    Insomnia    Spinal stenosis of lumbar region 11/30/2018   GERD (gastroesophageal reflux disease) 08/14/2017   S/P left TKA 08/11/2016   S/P knee replacement 08/11/2016   OSA (obstructive sleep apnea) 03/22/2014   Snoring 05/09/2013   Essential hypertension 02/11/2013   Dyslipidemia 02/11/2013   Arthritis 02/11/2013    PCP: Emil Aloysius Schaumann, MD  REFERRING PROVIDER: Maurilio CHRISTELLA Collet, PA-C  REFERRING DIAG: [phone] (ICD-10-CM) - Status post left knee replacement   THERAPY DIAG:  Localized edema  Difficulty in walking, not elsewhere classified  Muscle weakness (generalized)  Stiffness of right knee, not elsewhere classified  Acute  pain of right knee  Rationale for Evaluation and Treatment: Rehabilitation  ONSET DATE: February 03, 2024  SUBJECTIVE:   SUBJECTIVE STATEMENT: Bryann reports B HEP compliance.  He reports soreness that affects his sleep.    PERTINENT HISTORY: Arthritis, sciatica, emphysema, HLD, HTN, left TKA (~6 years ago), former 40-year smoker  PAIN:  Are you having pain? Yes: NPRS scale: 3-5/10 this week Pain location: Rt knee Pain description: Achy, stiff, discomfort Aggravating factors: Weight-bearing, prolonged postures Relieving factors: 0.75 Percocet before bed, ice per day and 2-3 tylenol   PRECAUTIONS: Knee and Back  RED FLAGS: None   WEIGHT BEARING RESTRICTIONS:  No  FALLS:  Has patient fallen in last 6 months? No  LIVING ENVIRONMENT: Lives with: lives with their family and lives with their son Lives in: House/apartment Stairs: Is doing stairs with the handrail and a cane Has following equipment at home: Single point cane and Environmental consultant - 2 wheeled  OCCUPATION: Retired PHD  PLOF: Independent  PATIENT GOALS: Be able to weed and garden, do yard work without knee or back restrictions  NEXT MD VISIT: 03/15/2024  OBJECTIVE:  Note: Objective measures were completed at Evaluation unless otherwise noted.  DIAGNOSTIC FINDINGS: Radiographs of the right knee show stable alignment of total knee  arthroplasty hardware.  There is no complicating feature.   PATIENT SURVEYS:  Patient-Specific Activity Scoring Scheme  0 represents "unable to perform." 10 represents "able to perform at prior level. 0 1 2 3 4 5 6 7 8 9  10 (Date and Score)   Activity Eval     1.  Sit on a low seat 5/10    2.  Clean the floor or garden 4/10    3.  Do laundry 4/10   4.  Take shower 6/10   5.  Work on laptop 7/10   Score 5.2     Total score = sum of the activity scores/number of activities Minimum detectable change (90%CI) for average score = 2 points Minimum detectable change (90%CI) for single activity score = 3 points     COGNITION: Overall cognitive status: Within functional limits for tasks assessed     SENSATION: No complaints of peripheral pain or paresthesias  EDEMA:  Noted and not objectively assessed   LOWER EXTREMITY ROM:  Active ROM Left/Right 02/17/2024 Right 02/22/24 Right  Rt 02/29/24 Right 03/02/2024  Knee flexion 115/88 A: 104 P: 110 A: 98 AA: 108 Active 108  Knee extension 0/-13 -9 (seated LAQ)  AA: -12 Active -7  Ankle dorsiflexion       Ankle plantarflexion       Ankle inversion       Ankle eversion        (Blank rows = not tested)  LOWER EXTREMITY MMT:  MMT Left/Right 02/15/2024   Hip flexion    Hip extension    Hip abduction     Hip adduction    Hip internal rotation    Hip external rotation    Knee flexion    Knee extension 4+/2+   Ankle dorsiflexion    Ankle plantarflexion    Ankle inversion    Ankle eversion     (Blank rows = not tested)  GAIT: Distance walked: 100 feet Assistive device utilized: Walker - 2 wheeled Level of assistance: Complete Independence Comments: Darrold was assistive device free before surgery  TREATMENT DATE:  03/02/2024 Recumbent bike Seat 10 for 5 minutes (extension emphasis)  Quad sets with right heel prop 2 sets of 10 x 5 seconds Prone quadriceps stretch with strap over opposite shoulder (Right only) 5 x 20 seconds Prone knee extension stretch 3 minutes Seated knee extension stretch 3 minutes  Functional Activities: Double Leg Press with stretch into flexion/extension 100# 15 reps with slow eccentrics Single leg Press Right only same instructions as double leg press 50# 10 reps  Vaso Right Knee 10 minutes Medium 34*   02/29/24:  TherEx Recumbent bike: level 4 seat 9, x 8 minutes Calf stretch on slant board x 3 holding 30 sec LAQ 4 # 2 x 10  Heel slides x 5 in supine Quad sets with overpressure x 5 hold 5 seconds TherActivites:  Double Leg Press: 87# 3 x 10  Rt single leg press: 43# 2 x 10  Sit to stand: x 10 mat at 20 inches Manual:  Contract/relax in sitting, contralateral LE performing opposite motion Modalities:  Vasopneuamtic: 34 deg x 10 minutes, medium compression, LE elevated    02/26/24 TherEx Scifit bike L1; seat 11, 5 min fwd, 3 min bwd Elevated seat knee flexion self stretch 10x10 Elevated seat hamstring curl end range 2x10x3 Seated hamstring stretch 2x 30 Seated quad set 2x10x5 Seated LAQ 5# on Rt; 5 sec hold; 2x10  TherAct Standing heel/toe raise x10 Standing slow march 2x10 Standing hip abd 2x10 Standing hip  ext 2x10  Modalities Vaso x 10 min; mod pressure in elevation to Rt knee; 34 deg   PATIENT EDUCATION:  Education details: See above Person educated: Patient Education method: Explanation, Demonstration, Tactile cues, Verbal cues, and Handouts Education comprehension: verbalized understanding, returned demonstration, verbal cues required, tactile cues required, and needs further education  HOME EXERCISE PROGRAM: Access Code: [email or web address] URL: [email or web address] Date: 02/17/2024 Prepared by: Lamar Ivory  Exercises - Supine Quadricep Sets  - 10 x daily - 7 x weekly - 1 sets - 10 reps - 5 second hold - Seated Knee Flexion AAROM  - 5 x daily - 7 x weekly - 1 sets - 10 reps - 10 seconds hold - Seated Passive Knee Extension  - 3 x daily - 7 x weekly - 1 sets - 1 reps - 3 minutes hold  ASSESSMENT:  CLINICAL IMPRESSION: Breeze has better AROM as compared to the last time I saw him.  Active range of 0 - 7 - 108 degrees today.  Extension AROM, quadriceps strength, balance and gait remain early priorities with Jimmy's post-TKA rehabilitation.  EVAL: Patient is a 78 y.o. male who was seen today for physical therapy evaluation and treatment for Z96.652 (ICD-10-CM) - Status post left knee replacement.  Neythan has active range of motion, quadriceps strength, edema, pain and functional impairments in need of skilled care.  He was started on an appropriate post TKA rehabilitation protocol with early emphasis on active range of motion (particularly extension), quadriceps strengthening and edema control.  Balance, gait and more advanced functional activities will be completed as appropriate.  OBJECTIVE IMPAIRMENTS: Abnormal gait, decreased activity tolerance, decreased balance, decreased endurance, decreased knowledge of condition, difficulty walking, decreased ROM, decreased strength, increased edema, impaired perceived functional ability, and pain.   ACTIVITY LIMITATIONS: bending,  standing, squatting, stairs, and locomotion level  PARTICIPATION LIMITATIONS: cleaning, laundry, driving, community activity, and yard work  PERSONAL FACTORS: Arthritis, emphysema, HLD, HTN, left TKA, former 40-year smoker are also affecting patient's functional outcome.   REHAB POTENTIAL: Good  CLINICAL DECISION MAKING: Stable/uncomplicated  EVALUATION COMPLEXITY: Low   GOALS: Goals reviewed with patient? Yes  SHORT TERM GOALS: Target date: 03/16/2024 Fedor will be independent with his day 1 home exercise program Baseline: Started 02/17/2024 Goal status: Met 03/02/2024  2.  Improve right knee active range of motion to 0 - 5 - 105 degrees Baseline: 0 - 13 - 88 degrees Goal status: Partially Met 03/02/2024  3.  Improve right quadriceps strength as assessed by MMT Baseline: 2+/5 Goal status: On Going 03/02/2024   LONG TERM GOALS: Target date: 04/13/2024  Improve patient specific functional score to at least 7.2 Baseline: 5.2 Goal status: INITIAL  2.  Billyjack will report right knee pain consistently 0-3/10 on the numeric pain rating scale Baseline: 1.5 - 6.5/10 Goal status: INITIAL  3.  Improve right knee active range of motion to 0 - 3 - 110 degrees or better Baseline: 0 - 13 - 88 degrees Goal status: INITIAL  4.  Improve right quadriceps strength as assessed by Orrin's ability to walk without the use of an assistive device and send and descend stairs reciprocally with use of a single handrail Baseline: Unable at evaluation Goal status: INITIAL  5.  Esaw will be independent and compliant with his long-term maintenance home exercise program at discharge Baseline: Started 02/17/2024 Goal status: INITIAL   PLAN:  PT FREQUENCY: 3x/week  PT DURATION: 8 weeks  PLANNED INTERVENTIONS: 97750- Physical Performance Testing, 97110-Therapeutic exercises, 97530- Therapeutic activity, 97112- Neuromuscular re-education, 97535- Self Care, 02859- Manual therapy, [phone]- Gait training,  [phone]- Vasopneumatic device, Patient/Family education, Balance training, Stair training, Joint mobilization, and Cryotherapy  PLAN FOR NEXT SESSION: Continue extension active range of motion and strength, balance and gait.   Myer LELON Ivory, PT, MPT 03/02/24 1:33 PM

## 2024-03-03 ENCOUNTER — Encounter: Payer: Self-pay | Admitting: Physical Therapy

## 2024-03-07 ENCOUNTER — Ambulatory Visit: Payer: Self-pay | Admitting: Physical Therapy

## 2024-03-07 ENCOUNTER — Encounter: Payer: Self-pay | Admitting: Physical Therapy

## 2024-03-07 DIAGNOSIS — M25661 Stiffness of right knee, not elsewhere classified: Secondary | ICD-10-CM

## 2024-03-07 DIAGNOSIS — M25561 Pain in right knee: Secondary | ICD-10-CM | POA: Diagnosis not present

## 2024-03-07 DIAGNOSIS — R262 Difficulty in walking, not elsewhere classified: Secondary | ICD-10-CM

## 2024-03-07 DIAGNOSIS — R600 Localized edema: Secondary | ICD-10-CM

## 2024-03-07 DIAGNOSIS — M6281 Muscle weakness (generalized): Secondary | ICD-10-CM | POA: Diagnosis not present

## 2024-03-07 NOTE — Therapy (Signed)
 OUTPATIENT PHYSICAL THERAPY LOWER EXTREMITY TREATMENT    Patient Name: Gregg Smith MRN: [id number] DOB:10-18-1945, 78 y.o., male Today's Date: 03/07/2024  END OF SESSION:  PT End of Session - 03/07/24 1148     Visit Number 8    Number of Visits 24    Date for PT Re-Evaluation 04/13/24    Progress Note Due on Visit 10    PT Start Time 1145    PT Stop Time 1235    PT Time Calculation (min) 50 min    Activity Tolerance Patient tolerated treatment well;No increased pain    Behavior During Therapy WFL for tasks assessed/performed              Past Medical History:  Diagnosis Date   Allergy    seasonal    Anxiety    Arthritis    Barrett's esophagus    Cataract    Chicken pox    Colon polyps    hyperplastic   Diverticulosis    Emphysema lung (HCC)    GERD (gastroesophageal reflux disease)    Hemorrhoids    Hyperlipidemia    Hypertension    ILD (interstitial lung disease) (HCC)    12/18/23 Mild basilar fibrotic interstitial lung disease, 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   Procedure: LEFT TOTAL KNEE ARTHROPLASTY;  Surgeon: Donnice Car, MD;  Location: [facility];  Service: Orthopedics;  Laterality: Left;   TOTAL KNEE ARTHROPLASTY Right 02/03/2024   Procedure: ARTHROPLASTY, KNEE, TOTAL;  Surgeon: Harden Jerona GAILS, MD;  Location: [facility];  Service: Orthopedics;  Laterality: Right;   UPPER GASTROINTESTINAL ENDOSCOPY     Patient Active Problem List   Diagnosis Date Noted   Osteoarthritis (arthritis due to wear and tear of joints) 02/03/2024   Left leg weakness 11/03/2023   Osteoarthritis  of lumbar spine with myelopathy 11/03/2023   Chronic pain of right knee 09/21/2023   Centrilobular emphysema (HCC) 06/04/2023   Interstitial lung disease (HCC) 06/04/2023   Chronic cough 02/03/2023   Overactive bladder 09/17/2022   Aortic atherosclerosis (HCC) 03/17/2022   Acute insomnia 10/23/2021   Chronic left-sided low back pain with left-sided sciatica 03/05/2021   Unilateral primary osteoarthritis, right knee 01/31/2021   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   Scoliosis deformity of spine 04/22/2019   Generalized anxiety disorder    Panic disorder    Insomnia    Spinal stenosis of lumbar region 11/30/2018   GERD (gastroesophageal reflux disease) 08/14/2017   S/P left TKA 08/11/2016   S/P knee replacement 08/11/2016   OSA (obstructive sleep apnea) 03/22/2014   Snoring 05/09/2013   Essential hypertension 02/11/2013   Dyslipidemia 02/11/2013   Arthritis 02/11/2013    PCP: Emil Aloysius Schaumann, MD  REFERRING PROVIDER: Maurilio CHRISTELLA Collet, PA-C  REFERRING DIAG: [phone] (ICD-10-CM) - Status post left knee replacement   THERAPY DIAG:  Localized edema  Difficulty in walking, not elsewhere classified  Stiffness of right knee, not elsewhere classified  Muscle weakness (generalized)  Acute pain of right knee  Rationale for Evaluation and Treatment: Rehabilitation  ONSET DATE: February 03, 2024  SUBJECTIVE:   SUBJECTIVE STATEMENT: He has been doing quad sets 10x/day, chair hang with foot in second chair and assisted knee flexion 5x /day.    PERTINENT HISTORY: Arthritis, sciatica, emphysema, HLD, HTN, left TKA (~6 years ago), former 40-year smoker  PAIN:  Are you having pain? Yes: NPRS scale: at rest  2.5/10,  stand & walking 1-4/10 and exercising 2-5/10 this week Pain location: Rt knee Pain description: Achy, stiff, discomfort Aggravating factors: Weight-bearing, prolonged postures Relieving factors: 0.75 Percocet before bed, ice per day and 2-3  tylenol   PRECAUTIONS: Knee and Back  RED FLAGS: None   WEIGHT BEARING RESTRICTIONS: No  FALLS:  Has patient fallen in last 6 months? No  LIVING ENVIRONMENT: Lives with: lives with their family and lives with their son Lives in: House/apartment Stairs: Is doing stairs with the handrail and a cane Has following equipment at home: Single point cane and Environmental consultant - 2 wheeled  OCCUPATION: Retired PHD  PLOF: Independent  PATIENT GOALS: Be able to weed and garden, do yard work without knee or back restrictions  NEXT MD VISIT: 03/15/2024  OBJECTIVE:  Note: Objective measures were completed at Evaluation unless otherwise noted.  DIAGNOSTIC FINDINGS: Radiographs of the right knee show stable alignment of total knee  arthroplasty hardware.  There is no complicating feature.   PATIENT SURVEYS:  Patient-Specific Activity Scoring Scheme  0 represents "unable to perform." 10 represents "able to perform at prior level. 0 1 2 3 4 5 6 7 8 9  10 (Date and Score)   Activity Eval     1.  Sit on a low seat 5/10    2.  Clean the floor or garden 4/10    3.  Do laundry 4/10   4.  Take shower 6/10   5.  Work on laptop 7/10   Score 5.2     Total score = sum of the activity scores/number of activities Minimum detectable change (90%CI) for average score = 2 points Minimum detectable change (90%CI) for single activity score = 3 points     COGNITION: Overall cognitive status: Within functional limits for tasks assessed     SENSATION: No complaints of peripheral pain or paresthesias  EDEMA:  Noted and not objectively assessed   LOWER EXTREMITY ROM:  Active ROM Left/Right 02/17/2024 Right 02/22/24 Right  Rt 02/29/24 Right 03/02/2024 Right 03/07/24  Knee flexion 115/88 A: 104 P: 110 A: 98 AA: 108 Active 108 Active Seated 110*  Knee extension 0/-13 -9 (seated LAQ)  AA: -12 Active -7 Seated  LAQ -7* Supine ball roll -5*  Ankle dorsiflexion        Ankle plantarflexion        Ankle  inversion        Ankle eversion         (Blank rows = not tested)  LOWER EXTREMITY MMT:  MMT Left/Right 02/15/2024   Hip flexion    Hip extension    Hip abduction    Hip adduction    Hip internal rotation    Hip external rotation    Knee flexion    Knee extension 4+/2+   Ankle dorsiflexion    Ankle plantarflexion    Ankle inversion    Ankle eversion     (Blank rows = not tested)  GAIT: Distance walked: 100 feet Assistive device utilized: Walker - 2 wheeled Level of assistance: Complete  Independence Comments: Dvonte was assistive device free before surgery                                                                                                                                TREATMENT DATE:  03/07/2024 Therapeutic Exercise: recumbent bike seat 10 level 3 for 5 min.  Standing TKE green theraband 10 reps 1st set TKE only & 2nd set TKE + SLS tapping LLE to cone. Rail & cane support.  Gastroc incline BLEs 30 sec 3 reps. Heel raised on incline board 10 reps 2 sets Seated LAQ & active knee flexion with contralateral LE opposing motion 10 reps.   Supine heel slide with ball & strap knee flexion 5 sec hold & leg press / knee ext 5 sec hold 10 reps.    Therapeutic Activities: Leg press BLEs 112# 15 reps with PT cues for full motion;  RLE 56# 10 reps  Manual Therapy RLE knee ext grade IV mob with belt standing on incline board Seated PROM Rt knee flexion with manual traction & tibial IR  Vaso Knee elevation with ext stretch medium compression 34* 10 min    03/02/2024 Recumbent bike Seat 10 for 5 minutes (extension emphasis)  Quad sets with right heel prop 2 sets of 10 x 5 seconds Prone quadriceps stretch with strap over opposite shoulder (Right only) 5 x 20 seconds Prone knee extension stretch 3 minutes Seated knee extension stretch 3 minutes  Functional Activities: Double Leg Press with stretch into flexion/extension 100# 15 reps with slow eccentrics Single leg  Press Right only same instructions as double leg press 50# 10 reps  Vaso Right Knee 10 minutes Medium 34*   02/29/24:  TherEx Recumbent bike: level 4 seat 9, x 8 minutes Calf stretch on slant board x 3 holding 30 sec LAQ 4 # 2 x 10  Heel slides x 5 in supine Quad sets with overpressure x 5 hold 5 seconds TherActivites:  Double Leg Press: 87# 3 x 10  Rt single leg press: 43# 2 x 10  Sit to stand: x 10 mat at 20 inches Manual:  Contract/relax in sitting, contralateral LE performing opposite motion Modalities:  Vasopneuamtic: 34 deg x 10 minutes, medium compression, LE elevated    PATIENT EDUCATION:  Education details: See above Person educated: Patient Education method: Explanation, Demonstration, Tactile cues, Verbal cues, and Handouts Education comprehension: verbalized understanding, returned demonstration, verbal cues required, tactile cues required, and needs further education  HOME EXERCISE PROGRAM: Access Code: [email or web address] URL: [email or web address] Date: 02/17/2024 Prepared by: Lamar Ivory  Exercises - Supine Quadricep Sets  - 10 x daily - 7 x weekly - 1 sets - 10 reps - 5 second hold - Seated Knee Flexion AAROM  - 5 x daily - 7 x weekly - 1 sets - 10 reps - 10 seconds hold - Seated Passive Knee Extension  - 3 x daily - 7 x weekly - 1 sets -  1 reps - 3 minutes hold  ASSESSMENT:  CLINICAL IMPRESSION: Pt improved functional knee ext with PT cues & progressing exercises.  He was able to ext more NWB than standing / WB.    EVAL: Patient is a 78 y.o. male who was seen today for physical therapy evaluation and treatment for Z96.652 (ICD-10-CM) - Status post left knee replacement.  Fotios has active range of motion, quadriceps strength, edema, pain and functional impairments in need of skilled care.  He was started on an appropriate post TKA rehabilitation protocol with early emphasis on active range of motion (particularly extension), quadriceps strengthening  and edema control.  Balance, gait and more advanced functional activities will be completed as appropriate.  OBJECTIVE IMPAIRMENTS: Abnormal gait, decreased activity tolerance, decreased balance, decreased endurance, decreased knowledge of condition, difficulty walking, decreased ROM, decreased strength, increased edema, impaired perceived functional ability, and pain.   ACTIVITY LIMITATIONS: bending, standing, squatting, stairs, and locomotion level  PARTICIPATION LIMITATIONS: cleaning, laundry, driving, community activity, and yard work  PERSONAL FACTORS: Arthritis, emphysema, HLD, HTN, left TKA, former 40-year smoker are also affecting patient's functional outcome.   REHAB POTENTIAL: Good  CLINICAL DECISION MAKING: Stable/uncomplicated  EVALUATION COMPLEXITY: Low   GOALS: Goals reviewed with patient? Yes  SHORT TERM GOALS: Target date: 03/16/2024 Nicasio will be independent with his day 1 home exercise program Baseline: Started 02/17/2024 Goal status: Met 03/02/2024  2.  Improve right knee active range of motion to 0 - 5 - 105 degrees Baseline: 0 - 13 - 88 degrees Goal status: Ongoing   7/72025  3.  Improve right quadriceps strength as assessed by MMT Baseline: 2+/5 Goal status: Ongoing   7/72025   LONG TERM GOALS: Target date: 04/13/2024  Improve patient specific functional score to at least 7.2 Baseline: 5.2 Goal status:   Ongoing   7/72025  2.  Kirin will report right knee pain consistently 0-3/10 on the numeric pain rating scale Baseline: 1.5 - 6.5/10 Goal status: Ongoing   7/72025  3.  Improve right knee active range of motion to 0 - 3 - 110 degrees or better Baseline: 0 - 13 - 88 degrees Goal status: Ongoing   7/72025  4.  Improve right quadriceps strength as assessed by Tecumseh's ability to walk without the use of an assistive device and send and descend stairs reciprocally with use of a single handrail Baseline: Unable at evaluation Goal status: Ongoing    7/72025  5.  Cian will be independent and compliant with his long-term maintenance home exercise program at discharge Baseline: Started 02/17/2024 Goal status:  Ongoing   7/72025   PLAN:  PT FREQUENCY: 3x/week  PT DURATION: 8 weeks  PLANNED INTERVENTIONS: 97750- Physical Performance Testing, 97110-Therapeutic exercises, 97530- Therapeutic activity, 97112- Neuromuscular re-education, 97535- Self Care, 02859- Manual therapy, [phone]- Gait training, [id number]- Vasopneumatic device, Patient/Family education, Balance training, Stair training, Joint mobilization, and Cryotherapy  PLAN FOR NEXT SESSION:      Continue with extension active range of motion and strength, balance and gait.   Grayce Spatz, PT, DPT 03/07/24 12:42 PM

## 2024-03-08 ENCOUNTER — Encounter

## 2024-03-09 ENCOUNTER — Ambulatory Visit: Admitting: Pulmonary Disease

## 2024-03-09 ENCOUNTER — Encounter: Payer: Self-pay | Admitting: Pulmonary Disease

## 2024-03-09 VITALS — BP 116/68 | HR 75 | Ht 71.0 in | Wt 232.6 lb

## 2024-03-09 DIAGNOSIS — G4733 Obstructive sleep apnea (adult) (pediatric): Secondary | ICD-10-CM

## 2024-03-09 DIAGNOSIS — J432 Centrilobular emphysema: Secondary | ICD-10-CM

## 2024-03-09 DIAGNOSIS — J8410 Pulmonary fibrosis, unspecified: Secondary | ICD-10-CM | POA: Diagnosis not present

## 2024-03-09 DIAGNOSIS — J849 Interstitial pulmonary disease, unspecified: Secondary | ICD-10-CM

## 2024-03-09 LAB — PULMONARY FUNCTION TEST
DL/VA % pred: 86 %
DL/VA: 3.39 ml/min/mmHg/L
DLCO cor % pred: 80 %
DLCO cor: 20.56 ml/min/mmHg
DLCO unc % pred: 80 %
DLCO unc: 20.5 ml/min/mmHg
FEF 25-75 Post: 3.8 L/s
FEF 25-75 Pre: 3.69 L/s
FEF2575-%Change-Post: 2 %
FEF2575-%Pred-Post: 171 %
FEF2575-%Pred-Pre: 166 %
FEV1-%Change-Post: 2 %
FEV1-%Pred-Post: 106 %
FEV1-%Pred-Pre: 103 %
FEV1-Post: 3.31 L
FEV1-Pre: 3.22 L
FEV1FVC-%Change-Post: 2 %
FEV1FVC-%Pred-Pre: 113 %
FEV6-%Change-Post: 0 %
FEV6-%Pred-Post: 96 %
FEV6-%Pred-Pre: 96 %
FEV6-Post: 3.93 L
FEV6-Pre: 3.92 L
FEV6FVC-%Change-Post: 0 %
FEV6FVC-%Pred-Post: 106 %
FEV6FVC-%Pred-Pre: 106 %
FVC-%Change-Post: 0 %
FVC-%Pred-Post: 90 %
FVC-%Pred-Pre: 90 %
FVC-Post: 3.93 L
FVC-Pre: 3.93 L
Post FEV1/FVC ratio: 84 %
Post FEV6/FVC ratio: 100 %
Pre FEV1/FVC ratio: 82 %
Pre FEV6/FVC Ratio: 100 %
RV % pred: 76 %
RV: 2.01 L
TLC % pred: 83 %
TLC: 6.05 L

## 2024-03-09 LAB — CK: Total CK: 73 U/L (ref 17–232)

## 2024-03-09 NOTE — Progress Notes (Signed)
 Full pft performed today.

## 2024-03-09 NOTE — Patient Instructions (Addendum)
 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

## 2024-03-09 NOTE — Progress Notes (Signed)
 Synopsis: Referred in April 2024 for Emphysema  Subjective:   PATIENT ID: Gregg Smith: male DOB: [birth year], MRN: [id number]  HPI  Chief Complaint  Patient presents with   Follow-up   Gregg Smith is a 78 year old male, former smoker with history of GERD, hypertension, seasonal allergies and OSA who returns to pulmonary clinic for emphysema and ILD.  Initial OV 12/07/23 He reports some exertional dyspnea and wheezing with activities such as running and sometimes while gardening. He notes difficulty with running and jumping jacks, attributing it to joint discomfort rather than endurance issues. This has been ongoing for a long time, but he has only recently engaged in more regimented exercise.  He has a productive cough approximately twice a day, with phlegm production, and frequently needs to clear his throat, especially before singing or speaking after a period of silence.  He quit smoking in 2000 after smoking two packs a day since the age of 51. 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  He has a history of sleep apnea, for which he was initially given a mouthpiece instead of a CPAP machine. After losing 35 pounds through a lifestyle program, his son noted a reduction in snoring, suggesting an improvement in his sleep apnea. He no longer uses the mouthpiece due to dental work and has not noticed significant issues since the weight loss.  He has a history of insomnia, which began in early 2020, coinciding with an episode of sciatica that resolved after several months. During this period, he required hospitalization for insomnia management and was placed on a medication regimen that has since been reduced. He currently uses Lunesta to aid sleep, as he finds it difficult to sleep without medication.  OV 03/09/24 He experiences  shortness of breath and uses a Trelegy inhaler, though he finds it difficult to establish a baseline for its effectiveness. He was prescribed albuterol  but has discontinued its use. A high-resolution CT scan shows basilar predominant subpleural reticular densities and ground glass with traction bronchiolectasis, with more pronounced scarring in the lower lung regions. Despite these findings, his pulmonary function tests remain normal. He has a history of smoking, which is a significant factor in his pulmonary fibrosis.   We discussed starting antifibrotic therapy and he is interested in meeting with our pharmacy team.   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   Insomnia    Leg cramps    Measles    Mumps    Obstructive sleep apnea    Pneumonia    Sleep apnea    dental device; no longer uses     Family History  Problem Relation Age of Onset   Alzheimer's disease Mother    Cancer Father    Stomach cancer Father    Aortic aneurysm Son    Heart disease Son  Cancer Maternal Grandmother    Heart disease Maternal Grandfather    Stomach cancer Paternal Grandfather    Colon cancer Neg Hx    Colon polyps Neg Hx    Esophageal cancer Neg Hx    Rectal cancer Neg Hx    Pancreatic cancer Neg Hx    Liver cancer Neg Hx      Social History   Socioeconomic History   Marital status: Widowed    Spouse name: [name]   Number of children: 1   Years of education: PHD   Highest education level: Doctorate  Occupational History   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: [employer]  Tobacco Use   Smoking status: Former    Current packs/day: 0.00    Average packs/day: 1 pack/day for 40.0 years (40.0 ttl pk-yrs)    Types: Cigarettes    Start date: 01/19/1967    Quit date: 01/19/2007    Years since quitting: 17.1   Smokeless tobacco: Never  Vaping Use   Vaping status: Never Used  Substance and Sexual Activity   Alcohol use: Yes    Alcohol/week: 3.0 standard drinks of alcohol    Types: 3 Cans of beer per week   Drug use: No   Sexual activity: Not on file  Other Topics Concern   Not on file  Social History Narrative   Patient lives alone in his home   Patient is retired   Patient has a Ph.D   Patient has one adult child.   Patient is right-handed.   Patient drinks 1 cup of tea and 1 cup of coffee daily    Social Drivers of Health   Financial Resource Strain: Low Risk  (09/20/2023)   Overall Financial Resource Strain (CARDIA)    Difficulty of Paying Living Expenses: Not hard at all  Food Insecurity: No Food Insecurity (02/04/2024)   Hunger Vital Sign    Worried About Running Out of Food in the Last Year: Never true    Ran Out of Food in the Last Year: Never true  Transportation Needs: No Transportation Needs (02/04/2024)   PRAPARE - Administrator, Civil Service (Medical): No    Lack of Transportation (Non-Medical): No  Physical Activity: Sufficiently Active (09/20/2023)   Exercise Vital Sign    Days of Exercise per Week: 4 days    Minutes of Exercise per Session: 50 min  Stress: No Stress Concern Present (09/20/2023)   [employer] of Occupational Health - Occupational Stress Questionnaire    Feeling of Stress : Only a little  Social Connections: Moderately Integrated (02/04/2024)   Social Connection and Isolation Panel    Frequency of Communication with Friends and Family: More than three times a week    Frequency of Social Gatherings with Friends and Family: More than three times a week    Attends  Religious Services: More than 4 times per year    Active Member of [employer] or Organizations: Yes    Attends Banker Meetings: More than 4 times per year    Marital Status: Widowed  Intimate Partner Violence: Not At Risk (02/04/2024)   Humiliation, Afraid, Rape, and Kick questionnaire    Fear of Current or Ex-Partner: No    Emotionally Abused: No    Physically Abused: No    Sexually Abused: No     Allergies  Allergen Reactions   Penicillins Hives and Rash    Has  patient had a PCN reaction causing immediate rash, facial/tongue/throat swelling, SOB or lightheadedness with hypotension:unsure Has patient had a PCN reaction causing severe rash involving mucus membranes or skin necrosis:unsure Has patient had a PCN reaction that required hospitalization:No Has patient had a PCN reaction occurring within the last 10 years:No If all of the above answers are NO, then may proceed with Cephalosporin use. Childhood reaction      Outpatient Medications Prior to Visit  Medication Sig Dispense Refill   albuterol  (VENTOLIN  HFA) 108 (90 Base) MCG/ACT inhaler Inhale 2 puffs into the lungs every 6 (six) hours as needed for wheezing or shortness of breath. 8 g 6   eszopiclone (LUNESTA) 2 MG TABS tablet Take 1 mg by mouth at bedtime.     Fluticasone -Umeclidin-Vilant (TRELEGY ELLIPTA ) 100-62.5-25 MCG/ACT AEPB Inhale 1 puff into the lungs daily. 60 each 11   GEMTESA  75 MG TABS TAKE 75 MG BY MOUTH DAILY. 90 tablet 1   L-Theanine 200 MG CAPS Take 200 mg by mouth daily.     lansoprazole  (PREVACID ) 30 MG capsule TAKE 1 CAPSULE (30 MG TOTAL) BY MOUTH 2 (TWO) TIMES DAILY BEFORE A MEAL. 180 capsule 1   Milk Thistle 1000 MG CAPS Take 1,000 mg by mouth daily.     Multiple Vitamin (MULTIVITAMIN) tablet Take 1 tablet by mouth daily.     OVER THE COUNTER MEDICATION Take 1 tablet by mouth daily. Calm Aid     oxyCODONE -acetaminophen  (PERCOCET/ROXICET) 5-325 MG tablet Take 1 tablet by mouth every 6 (six)  hours as needed. 30 tablet 0   rosuvastatin  (CRESTOR ) 10 MG tablet TAKE 1 TABLET BY MOUTH EVERY DAY 90 tablet 3   tamsulosin  (FLOMAX ) 0.4 MG CAPS capsule TAKE 1 CAPSULE BY MOUTH DAILY AT NIGHT. 90 capsule 1   valsartan -hydrochlorothiazide  (DIOVAN -HCT) 80-12.5 MG tablet TAKE 1 TABLET BY MOUTH EVERY DAY 90 tablet 3   Facility-Administered Medications Prior to Visit  Medication Dose Route Frequency Provider Last Rate Last Admin   0.9 %  sodium chloride  infusion  500 mL Intravenous Once Abran Norleen SAILOR, MD       Review of Systems  Constitutional:  Negative for chills, fever, malaise/fatigue and weight loss.  HENT:  Negative for congestion, sinus pain and sore throat.   Eyes: Negative.   Respiratory:  Positive for cough, sputum production, shortness of breath and wheezing. Negative for hemoptysis.   Cardiovascular:  Negative for chest pain, palpitations, orthopnea, claudication and leg swelling.  Gastrointestinal:  Negative for abdominal pain, heartburn, nausea and vomiting.  Genitourinary: Negative.   Musculoskeletal:  Negative for joint pain and myalgias.  Skin:  Negative for rash.  Neurological:  Negative for weakness.  Endo/Heme/Allergies: Negative.   Psychiatric/Behavioral: Negative.      Objective:   Vitals:   03/09/24 1050 03/09/24 1051  BP: 116/68 116/68  Pulse: 74 75  SpO2: 97% 96%  Weight: 232 lb 9.6 oz (105.5 kg)   Height: 5' 11 (1.803 m)     Physical Exam Constitutional:      General: He is not in acute distress.    Appearance: Normal appearance. He is obese.  Eyes:     General: No scleral icterus.    Conjunctiva/sclera: Conjunctivae normal.  Cardiovascular:     Rate and Rhythm: Normal rate and regular rhythm.  Pulmonary:     Breath sounds: No wheezing, rhonchi or rales.  Musculoskeletal:     Right lower leg: No edema.     Left lower leg: No edema.  Skin:  General: Skin is warm and dry.  Neurological:     General: No focal deficit present.    CBC     Component Value Date/Time   WBC 6.1 01/21/2024 0902   RBC 4.99 01/21/2024 0902   HGB 14.5 01/21/2024 0902   HGB 14.3 01/11/2020 1005   HCT 43.8 01/21/2024 0902   HCT 42.4 01/11/2020 1005   PLT 174 01/21/2024 0902   PLT 168 01/11/2020 1005   MCV 87.8 01/21/2024 0902   MCV 86 01/11/2020 1005   MCH 29.1 01/21/2024 0902   MCHC 33.1 01/21/2024 0902   RDW 15.1 01/21/2024 0902   RDW 14.0 01/11/2020 1005   LYMPHSABS 0.9 03/18/2023 1004   LYMPHSABS 1.1 01/11/2020 1005   MONOABS 0.8 03/18/2023 1004   EOSABS 0.2 03/18/2023 1004   EOSABS 0.2 01/11/2020 1005   BASOSABS 0.1 03/18/2023 1004   BASOSABS 0.1 01/11/2020 1005      Latest Ref Rng & Units 01/21/2024    9:02 AM 06/12/2023    3:13 PM 03/18/2023   10:04 AM  BMP  Glucose 70 - 99 mg/dL 95  92  897   BUN 8 - 23 mg/dL 23  24  23    Creatinine 0.61 - 1.24 mg/dL 8.93  8.78  9.00   BUN/Creat Ratio 10 - 24  20    Sodium 135 - 145 mmol/L 140  141  142   Potassium 3.5 - 5.1 mmol/L 3.9  4.0  4.1   Chloride 98 - 111 mmol/L 107  106  106   CO2 22 - 32 mmol/L 25  22  29    Calcium  8.9 - 10.3 mg/dL 9.1  9.3  9.5    Chest imaging: HRCT Chest 12/18/23 1. 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 Findings are categorized as probable UIP per consensus guidelines: Diagnosis of Idiopathic Pulmonary Fibrosis: An Official ATS/ERS/JRS/ALAT Clinical Practice Guideline. Am JINNY Honey Crit Care Med Vol 198, Iss 5, [id number], May 02 2017. 2. 1.6 cm low-attenuation left thyroid nodule. Recommend thyroid ultrasound. (Ref: J Am Coll Radiol. 2015 Feb;12(2): 143-50). 3.  Aortic atherosclerosis (ICD10-I70.0). 4. Enlarged right and left pulmonary arteries, indicative of pulmonary arterial hypertension.  CT Chest 04/29/23 1. Lung-RADS 1S, negative. Continue annual screening with low-dose chest CT without contrast in 12 months. 2. The S modifier above refers to potentially clinically significant non lung  cancer related findings. Specifically, there is evidence of probable interstitial lung disease. Outpatient referral to Pulmonology for further clinical evaluation is recommended. Follow-up nonemergent high-resolution chest CT should also be considered for further characterization. 3. Mild diffuse bronchial wall thickening with mild centrilobular and paraseptal emphysema; imaging findings suggestive of underlying COPD. 4. Hepatic steatosis  PFT:    Latest Ref Rng & Units 03/09/2024    9:59 AM  PFT Results  FVC-Pre L 3.93  P  FVC-Predicted Pre % 90  P  FVC-Post L 3.93  P  FVC-Predicted Post % 90  P  Pre FEV1/FVC % % 82  P  Post FEV1/FCV % % 84  P  FEV1-Pre L 3.22  P  FEV1-Predicted Pre % 103  P  FEV1-Post L 3.31  P  DLCO uncorrected ml/min/mmHg 20.50  P  DLCO UNC% % 80  P  DLCO corrected ml/min/mmHg 20.56  P  DLCO COR %Predicted % 80  P  DLVA Predicted % 86  P  TLC L 6.05  P  TLC % Predicted % 83  P  RV % Predicted %  76  P    P Preliminary result    Labs:  Path:  Echo:  Heart Catheterization:    Assessment & Plan:   Pulmonary fibrosis (HCC) - Plan: ANA, ANCA screen with reflex titer, Anti-Smith antibody, Centromere Antibodies, Cyclic citrul peptide antibody, IgG, Hypersensitivity Pneumonitis, Rheumatoid factor, Sjogren's syndrome antibods(ssa + ssb), CK, Aldolase, ANA, ANCA screen with reflex titer, Anti-Smith antibody, Centromere Antibodies, Cyclic citrul peptide antibody, IgG, Hypersensitivity Pneumonitis, Rheumatoid factor, Sjogren's syndrome antibods(ssa + ssb), CK, Aldolase  Centrilobular emphysema (HCC)  OSA (obstructive sleep apnea)  Discussion: Gregg Smith is a 78 year old male, former smoker with history of GERD, hypertension, seasonal allergies and OSA who returns to pulmonary clinic for emphysema and pulmonary fibrosis.  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 Mild scarring primarily in the lower lobes. Pulmonary function tests normal. Discussed progressive nature of pulmonary fibrosis and role of antifibrotic therapy in slowing disease progression by approximately 50%. Emphasized early intervention to preserve lung function. Discussed potential side effects of antifibrotic medications. Smoking history is likely etiology of pulmonary fibrosis. - Schedule appointment with pharmacist to discuss antifibrotic therapy options and start therapy - Order autoimmune panel   Follow up in 3 months   Dorn Chill, MD [facility] Office: [phone]   Current Outpatient Medications:    albuterol  (VENTOLIN  HFA) 108 (90 Base) MCG/ACT inhaler, Inhale 2 puffs into the lungs every 6 (six) hours as needed for wheezing or shortness of breath., Disp: 8 g, Rfl: 6   eszopiclone (LUNESTA) 2 MG TABS tablet, Take 1 mg by mouth at bedtime., Disp: , Rfl:    Fluticasone -Umeclidin-Vilant (TRELEGY ELLIPTA ) 100-62.5-25 MCG/ACT AEPB, Inhale 1 puff into the lungs daily., Disp: 60 each, Rfl: 11   GEMTESA  75 MG TABS, TAKE 75 MG BY MOUTH DAILY., Disp: 90 tablet, Rfl: 1   L-Theanine 200 MG CAPS, Take 200 mg by mouth daily., Disp: , Rfl:    lansoprazole  (PREVACID ) 30 MG capsule, TAKE 1 CAPSULE (30 MG TOTAL) BY MOUTH 2 (TWO) TIMES DAILY BEFORE A MEAL., Disp: 180 capsule, Rfl: 1   Milk Thistle 1000 MG CAPS, Take 1,000 mg by mouth daily., Disp: , Rfl:    Multiple Vitamin (MULTIVITAMIN) tablet, Take 1 tablet by mouth daily., Disp: , Rfl:    OVER THE COUNTER MEDICATION, Take 1 tablet by mouth daily. Calm Aid, Disp: , Rfl:    oxyCODONE -acetaminophen  (PERCOCET/ROXICET) 5-325 MG tablet, Take 1 tablet by mouth every 6 (six) hours as needed., Disp: 30 tablet, Rfl: 0   rosuvastatin  (CRESTOR ) 10 MG tablet, TAKE 1 TABLET BY MOUTH EVERY DAY, Disp: 90 tablet, Rfl: 3   tamsulosin  (FLOMAX ) 0.4 MG CAPS capsule, TAKE 1 CAPSULE BY MOUTH DAILY AT NIGHT., Disp: 90  capsule, Rfl: 1   valsartan -hydrochlorothiazide  (DIOVAN -HCT) 80-12.5 MG tablet, TAKE 1 TABLET BY MOUTH EVERY DAY, Disp: 90 tablet, Rfl: 3  Current Facility-Administered Medications:    0.9 %  sodium chloride  infusion, 500 mL, Intravenous, Once, Abran Norleen SAILOR, MD

## 2024-03-09 NOTE — Patient Instructions (Signed)
 Full pft performed today.

## 2024-03-10 ENCOUNTER — Encounter: Payer: Self-pay | Admitting: Physical Therapy

## 2024-03-10 ENCOUNTER — Ambulatory Visit: Admitting: Physical Therapy

## 2024-03-10 DIAGNOSIS — M25661 Stiffness of right knee, not elsewhere classified: Secondary | ICD-10-CM

## 2024-03-10 DIAGNOSIS — R262 Difficulty in walking, not elsewhere classified: Secondary | ICD-10-CM | POA: Diagnosis not present

## 2024-03-10 DIAGNOSIS — M25561 Pain in right knee: Secondary | ICD-10-CM | POA: Diagnosis not present

## 2024-03-10 DIAGNOSIS — M6281 Muscle weakness (generalized): Secondary | ICD-10-CM | POA: Diagnosis not present

## 2024-03-10 DIAGNOSIS — R600 Localized edema: Secondary | ICD-10-CM | POA: Diagnosis not present

## 2024-03-10 NOTE — Progress Notes (Deleted)
 Subjective:  Patient called today by [facility] Pulmonary pharmacy team for Ofev new start counseling.   Patient was last seen by Dr. Kara on 03/09/2024. Pertinent past medical history includes OSA, Centrilobular emphysema, ILD, HTN, seasonal allergies and GERD.  History of CAD: No History of MI: No Current anticoagulant use: No History of HTN: Yes  History of elevated LFTs: No History of diarrhea, nausea, vomiting: No  Objective: Allergies  Allergen Reactions   Sulfa Antibiotics Rash    Outpatient Encounter Medications as of 01/20/2024  Medication Sig Note   Ascorbic Acid (VITAMIN C PO) Take 1 tablet by mouth daily.    atorvastatin (LIPITOR) 10 MG tablet Take 5 mg by mouth daily at 6 PM.  01/08/2022: Last dose 01/02/22   Azelaic Acid 15 % gel Apply 1 application. topically daily.    Calcium  Carb-Cholecalciferol (CALCIUM  + D3 PO) Take 1 tablet by mouth daily.    metroNIDAZOLE (METROGEL) 0.75 % gel Apply 1 application. topically daily. 11/13/2015: Received from: External Pharmacy   Misc Natural Products (GLUCOSAMINE-CHONDROITIN PLUS PO) Take 1 tablet by mouth daily.    Multiple Vitamin (MULTIVITAMIN) tablet Take 1 tablet by mouth daily.    Omega-3 Fatty Acids (FISH OIL PO) Take 1 capsule by mouth daily.    pantoprazole  (PROTONIX ) 40 MG tablet Take 40 mg by mouth daily.    Probiotic Product (PROBIOTIC PO) Take 1 capsule by mouth daily.    Propylene Glycol (SYSTANE COMPLETE OP) Apply 1 drop to eye daily as needed (dry eyes).    sodium chloride  (OCEAN) 0.65 % SOLN nasal spray Place 1 spray into both nostrils 2 (two) times daily as needed for congestion.    traMADol (ULTRAM) 50 MG tablet Take 1 tablet (50 mg total) by mouth 2 (two) times daily.    No facility-administered encounter medications on file as of 01/20/2024.     Immunization History  Administered Date(s) Administered   Fluad Trivalent(High Dose 65+) 06/16/2023   Hepatitis A 09/27/2008, 11/12/2009   PFIZER(Purple  Top)SARS-COV-2 Vaccination 09/10/2019, 10/01/2019   Pfizer(Comirnaty)Fall Seasonal Vaccine 12 years and older 06/06/2023   Pneumococcal Conjugate-13 07/10/2002   Td 11/01/2008, 09/09/2012   Typhoid Live 10/30/2008   Yellow Fever 10/06/2008   Zoster, Live 06/12/2008      PFT's TLC  Date Value Ref Range Status  11/12/2023 3.56 L Final      CMP     Component Value Date/Time   NA 139 01/10/2022 0151   K 4.1 01/10/2022 0151   CL 108 01/10/2022 0151   CO2 24 01/10/2022 0151   GLUCOSE 88 01/10/2022 0151   BUN 11 01/10/2022 0151   CREATININE 1.07 (H) 01/10/2022 0151   CALCIUM  8.5 (L) 01/10/2022 0151   PROT 6.6 01/08/2022 0411   ALBUMIN 3.3 (L) 01/08/2022 0411   AST 22 01/08/2022 0411   ALT 17 01/08/2022 0411   ALKPHOS 55 01/08/2022 0411   BILITOT 1.3 (H) 01/08/2022 0411   GFRNONAA 52 (L) 01/10/2022 0151   GFRAA  11/05/2009 1520    >60        The eGFR has been calculated using the MDRD equation. This calculation has not been validated in all clinical situations. eGFR's persistently <60 mL/min signify possible Chronic Kidney Disease.      CBC    Component Value Date/Time   WBC 6.8 01/11/2022 0351   RBC 3.42 (L) 01/11/2022 0351   HGB 10.8 (L) 01/11/2022 0351   HCT 31.7 (L) 01/11/2022 0351   PLT 201 01/11/2022 0351  MCV 92.7 01/11/2022 0351   MCH 31.6 01/11/2022 0351   MCHC 34.1 01/11/2022 0351   RDW 12.3 01/11/2022 0351   LYMPHSABS 2.5 01/10/2022 0151   MONOABS 0.9 01/10/2022 0151   EOSABS 0.3 01/10/2022 0151   BASOSABS 0.1 01/10/2022 0151      LFT's    Latest Ref Rng & Units 01/08/2022    4:11 AM 01/06/2022   12:59 PM 11/05/2009    3:20 PM  Hepatic Function  Total Protein 6.5 - 8.1 g/dL 6.6  6.6  6.3   Albumin 3.5 - 5.0 g/dL 3.3  3.3  3.7   AST 15 - 41 U/L 22  25  27    ALT 0 - 44 U/L 17  19  22    Alk Phosphatase 38 - 126 U/L 55  55  62   Total Bilirubin 0.3 - 1.2 mg/dL 1.3  0.8  0.5       HRCT Chest 12/18/2023(Mild basilar fibrotic interstitial  lung disease, 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 Findings are categorized as probable UIP per consensus guidelines)  Assessment and Plan Ofev and pirfenidone Medication Management Thoroughly counseled patient on the efficacy, mechanism of action, dosing, administration, adverse effects, and monitoring parameters of Ofev. Patient verbalized understanding.   Goals of Therapy: Will not stop or reverse the progression of ILD. It will slow the progression of ILD.  Ofev: Inhibits tyrosine kinase inhibitors which slow the fibrosis/progression of ILD -Significant reduction in the rate of disease progression was observed after treatment (61.1% [before] vs 33.3% [after], P?=?0.008) over 42 weeks.   Ofev: Dosing: 150 mg (one capsule) by mouth twice daily (approx 12 hours apart). Discussed taking with food approximately 12 hours apart. Discussed that capsule should not be crushed or split.   Adverse Effects: Nausea, vomiting, diarrhea (2 in 3 patients) appetite loss, weight loss - management of diarrhea with loperamide discussed including max use of 48 hours and max of 8 capsules per day. Abdominal pain (up to 1 in 5 patients) Nasopharyngitis (13%), UTI (6%) Risk of thrombosis (3%) and acute MI (2%) Hypertension (5%) Dizziness Fatigue (10%)   Pirfenidone: Dosing: Starting dose will be pirfenidone 267 mg 1 tablet three times daily for 7 days, then 2 tablets three times daily for 7 days, then 3 tablets three times daily.  Maintenance dose will be 801 mg 1 tablet three times daily if tolerated.  Stressed the importance of taking with meals and space at least 5-6 hours apart to minimize stomach upset.    Adverse Effects: Nausea, vomiting, diarrhea, weight loss Abdominal pain GERD Sun sensitivity/rash - patient advised to wear sunscreen when exposed to sunlight Dizziness Fatigue   Both Ofev and pirfenidone: Monitoring: Monitor for diarrhea, nausea and vomiting, GI perforation,  hepatotoxicity  Monitor LFTs - baseline, monthly for first 6 months, then every 3 months routinely CBC w differential at baseline and every 3 months routinely    Medication Reconciliation A drug regimen assessment was performed, including review of allergies, interactions, disease-state management, dosing and immunization history. Medications were reviewed with the patient, including name, instructions, indication, goals of therapy, potential side effects, importance of adherence, and safe use.  Anticoagulant use: No  PLAN After detailed discussion of Ofev vs pirfenidone, patient preferred to trial ***. LFTs reviewed from ***. Patient assistance application completed by patient at office appointment. Prescriber form will be placed in ***'s mailbox for signature.  This appointment required *** minutes of patient care (this includes precharting, chart review, review of results, face-to-face  care, etc.).  Thank you for involving pharmacy to assist in providing this patient's care.

## 2024-03-10 NOTE — Therapy (Signed)
 OUTPATIENT PHYSICAL THERAPY LOWER EXTREMITY TREATMENT    Patient Name: Gregg Smith MRN: [id number] DOB:1946-08-28, 78 y.o., male Today's Date: 03/10/2024  END OF SESSION:  PT End of Session - 03/10/24 1101     Visit Number 9    Number of Visits 24    Date for PT Re-Evaluation 04/13/24    Progress Note Due on Visit 10    PT Start Time 1101    PT Stop Time 1140    PT Time Calculation (min) 39 min    Activity Tolerance Patient tolerated treatment well;No increased pain    Behavior During Therapy WFL for tasks assessed/performed           Past Medical History:  Diagnosis Date   Allergy    seasonal    Anxiety    Arthritis    Barrett's esophagus    Cataract    Chicken pox    Colon polyps    hyperplastic   Diverticulosis    Emphysema lung (HCC)    GERD (gastroesophageal reflux disease)    Hemorrhoids    Hyperlipidemia    Hypertension    ILD (interstitial lung disease) (HCC)    12/18/23 Mild basilar fibrotic interstitial lung disease, 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   Procedure: LEFT TOTAL KNEE ARTHROPLASTY;  Surgeon: Donnice Car, MD;  Location: [facility];  Service: Orthopedics;  Laterality: Left;   TOTAL KNEE ARTHROPLASTY Right 02/03/2024   Procedure: ARTHROPLASTY, KNEE, TOTAL;  Surgeon: Harden Jerona GAILS, MD;  Location: [facility] OR;  Service: Orthopedics;  Laterality: Right;   UPPER GASTROINTESTINAL ENDOSCOPY     Patient Active Problem List   Diagnosis Date Noted   Osteoarthritis (arthritis due to wear and tear of joints) 02/03/2024   Left leg weakness 11/03/2023   Osteoarthritis of  lumbar spine with myelopathy 11/03/2023   Chronic pain of right knee 09/21/2023   Centrilobular emphysema (HCC) 06/04/2023   Interstitial lung disease (HCC) 06/04/2023   Chronic cough 02/03/2023   Overactive bladder 09/17/2022   Aortic atherosclerosis (HCC) 03/17/2022   Acute insomnia 10/23/2021   Chronic left-sided low back pain with left-sided sciatica 03/05/2021   Unilateral primary osteoarthritis, right knee 01/31/2021   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   Scoliosis deformity of spine 04/22/2019   Generalized anxiety disorder    Panic disorder    Insomnia    Spinal stenosis of lumbar region 11/30/2018   GERD (gastroesophageal reflux disease) 08/14/2017   S/P left TKA 08/11/2016   S/P knee replacement 08/11/2016   OSA (obstructive sleep apnea) 03/22/2014   Snoring 05/09/2013   Essential hypertension 02/11/2013   Dyslipidemia 02/11/2013   Arthritis 02/11/2013    PCP: Emil Aloysius Schaumann, MD  REFERRING PROVIDER: Maurilio CHRISTELLA Collet, PA-C  REFERRING DIAG: [phone] (ICD-10-CM) - Status post left knee replacement   THERAPY DIAG:  Localized edema  Difficulty in walking, not elsewhere classified  Stiffness of right knee, not elsewhere classified  Muscle weakness (generalized)  Acute pain of  right knee  Rationale for Evaluation and Treatment: Rehabilitation  ONSET DATE: February 03, 2024  SUBJECTIVE:   SUBJECTIVE STATEMENT: This week it's been kind of sore. I feel it deep and in the back. Incision is pretty much all the way healed.  PERTINENT HISTORY: Arthritis, sciatica, emphysema, HLD, HTN, left TKA (~6 years ago), former 40-year smoker  PAIN:  Are you having pain? Yes: NPRS scale: at rest  2.5/10,  stand & walking 1-4/10 and exercising 2-5/10 this week Pain location: Rt knee Pain description: Achy, stiff, discomfort Aggravating factors: Weight-bearing, prolonged postures Relieving factors: 0.75 Percocet before bed, ice per day and 2-3  tylenol   PRECAUTIONS: Knee and Back  RED FLAGS: None   WEIGHT BEARING RESTRICTIONS: No  FALLS:  Has patient fallen in last 6 months? No  LIVING ENVIRONMENT: Lives with: lives with their family and lives with their son Lives in: House/apartment Stairs: Is doing stairs with the handrail and a cane Has following equipment at home: Single point cane and Environmental consultant - 2 wheeled  OCCUPATION: Retired PHD  PLOF: Independent  PATIENT GOALS: Be able to weed and garden, do yard work without knee or back restrictions  NEXT MD VISIT: 03/15/2024  OBJECTIVE:  Note: Objective measures were completed at Evaluation unless otherwise noted.  DIAGNOSTIC FINDINGS: Radiographs of the right knee show stable alignment of total knee  arthroplasty hardware.  There is no complicating feature.   PATIENT SURVEYS:  Patient-Specific Activity Scoring Scheme  0 represents "unable to perform." 10 represents "able to perform at prior level. 0 1 2 3 4 5 6 7 8 9  10 (Date and Score)   Activity Eval     1.  Sit on a low seat 5/10    2.  Clean the floor or garden 4/10    3.  Do laundry 4/10   4.  Take shower 6/10   5.  Work on laptop 7/10   Score 5.2     Total score = sum of the activity scores/number of activities Minimum detectable change (90%CI) for average score = 2 points Minimum detectable change (90%CI) for single activity score = 3 points     SENSATION: No complaints of peripheral pain or paresthesias  EDEMA:  Noted and not objectively assessed   LOWER EXTREMITY ROM:  Active ROM Left/Right 02/17/2024 Right 02/22/24 Right  Rt 02/29/24 Right 03/02/2024 Right 03/07/24  Knee flexion 115/88 A: 104 P: 110 A: 98 AA: 108 Active 108 Active Seated 110*  Knee extension 0/-13 -9 (seated LAQ)  AA: -12 Active -7 Seated  LAQ -7* Supine ball roll -5*  Ankle dorsiflexion        Ankle plantarflexion        Ankle inversion        Ankle eversion         (Blank rows = not tested)  LOWER EXTREMITY  MMT:  MMT Left/Right 02/15/2024   Hip flexion    Hip extension    Hip abduction    Hip adduction    Hip internal rotation    Hip external rotation    Knee flexion    Knee extension 4+/2+   Ankle dorsiflexion    Ankle plantarflexion    Ankle inversion    Ankle eversion     (Blank rows = not tested)  GAIT: Distance walked: 100 feet Assistive device utilized: Walker - 2 wheeled Level of assistance: Complete Independence Comments: Gregg Smith was assistive device free before surgery  TREATMENT DATE:  03/10/2024 Therapeutic Exercise: Scifit bike L4; 3 min fwd, 3 min bwd Seated self knee flexion 10x5 Seated knee flexion with PT overpressure x10 Seated hamstring curl red TB 2x10 Seated quad set foot on stool 2x10 Standing runner's stretch with terminal knee ext 2x30 Standing at counter hip abduction red TB 2x10  Manual Therapy: Grade III to IV mobs for knee ext Grade II to IV MWM for knee ext in standing   Gait Training: Amb with SPC x 200' focusing on improving trunk/hip ext during stance, reducing R LE ER Amb without a/d x200' focusing on maintaining normal reciprocal pattern; however, pt demos R lateral lean during stance  Self Care: SpO2 checked throughout session due to concerns with pt's SHOB -- stable during amb at 98-100%; dropped to 94% with standing hip abd Discussed s/s of blood clots and to call MD if continued drop in spO2 at home   03/07/2024 Therapeutic Exercise: recumbent bike seat 10 level 3 for 5 min.  Standing TKE green theraband 10 reps 1st set TKE only & 2nd set TKE + SLS tapping LLE to cone. Rail & cane support.  Gastroc incline BLEs 30 sec 3 reps. Heel raised on incline board 10 reps 2 sets Seated LAQ & active knee flexion with contralateral LE opposing motion 10 reps.   Supine heel slide with ball & strap knee flexion 5 sec  hold & leg press / knee ext 5 sec hold 10 reps.    Therapeutic Activities: Leg press BLEs 112# 15 reps with PT cues for full motion;  RLE 56# 10 reps  Manual Therapy RLE knee ext grade IV mob with belt standing on incline board Seated PROM Rt knee flexion with manual traction & tibial IR  Vaso Knee elevation with ext stretch medium compression 34* 10 min    03/02/2024 Recumbent bike Seat 10 for 5 minutes (extension emphasis)  Quad sets with right heel prop 2 sets of 10 x 5 seconds Prone quadriceps stretch with strap over opposite shoulder (Right only) 5 x 20 seconds Prone knee extension stretch 3 minutes Seated knee extension stretch 3 minutes  Functional Activities: Double Leg Press with stretch into flexion/extension 100# 15 reps with slow eccentrics Single leg Press Right only same instructions as double leg press 50# 10 reps  Vaso Right Knee 10 minutes Medium 34*   02/29/24:  TherEx Recumbent bike: level 4 seat 9, x 8 minutes Calf stretch on slant board x 3 holding 30 sec LAQ 4 # 2 x 10  Heel slides x 5 in supine Quad sets with overpressure x 5 hold 5 seconds TherActivites:  Double Leg Press: 87# 3 x 10  Rt single leg press: 43# 2 x 10  Sit to stand: x 10 mat at 20 inches Manual:  Contract/relax in sitting, contralateral LE performing opposite motion Modalities:  Vasopneuamtic: 34 deg x 10 minutes, medium compression, LE elevated    PATIENT EDUCATION:  Education details: See above Person educated: Patient Education method: Explanation, Demonstration, Tactile cues, Verbal cues, and Handouts Education comprehension: verbalized understanding, returned demonstration, verbal cues required, tactile cues required, and needs further education  HOME EXERCISE PROGRAM: Access Code: [email or web address] URL: [email or web address] Date: 02/17/2024 Prepared by: Lamar Ivory  Exercises - Supine Quadricep Sets  - 10 x daily - 7 x weekly - 1 sets - 10 reps - 5 second  hold - Seated Knee Flexion AAROM  - 5 x daily - 7 x weekly - 1 sets - 10 reps -  10 seconds hold - Seated Passive Knee Extension  - 3 x daily - 7 x weekly - 1 sets - 1 reps - 3 minutes hold  ASSESSMENT:  CLINICAL IMPRESSION: Treatment focused on progressing pt's knee ROM and strength. Knee range continues to increase towards his goals. Worked on improving normal reciprocal gait pattern this session with pt demonstrating good form. Pt and son are currently concerned by his shortness of breath -- spO2 monitored throughout treatment and he mostly remained at 98-100% but did demonstrate a drop to 94% with standing hip abduction and some complaint of dizziness. Pt did mention a history of pulmonary issues. Discussed with pt and son on s/s of blood clots and to continue to monitor his oxygen at home. Advised them to talk to ortho if needed about their concerns.   EVAL: Patient is a 78 y.o. male who was seen today for physical therapy evaluation and treatment for Z96.652 (ICD-10-CM) - Status post left knee replacement.  Gregg Smith has active range of motion, quadriceps strength, edema, pain and functional impairments in need of skilled care.  He was started on an appropriate post TKA rehabilitation protocol with early emphasis on active range of motion (particularly extension), quadriceps strengthening and edema control.  Balance, gait and more advanced functional activities will be completed as appropriate.  OBJECTIVE IMPAIRMENTS: Abnormal gait, decreased activity tolerance, decreased balance, decreased endurance, decreased knowledge of condition, difficulty walking, decreased ROM, decreased strength, increased edema, impaired perceived functional ability, and pain.   ACTIVITY LIMITATIONS: bending, standing, squatting, stairs, and locomotion level  PARTICIPATION LIMITATIONS: cleaning, laundry, driving, community activity, and yard work  PERSONAL FACTORS: Arthritis, emphysema, HLD, HTN, left TKA, former 40-year  smoker are also affecting patient's functional outcome.   REHAB POTENTIAL: Good  CLINICAL DECISION MAKING: Stable/uncomplicated  EVALUATION COMPLEXITY: Low   GOALS: Goals reviewed with patient? Yes  SHORT TERM GOALS: Target date: 03/16/2024 Rad will be independent with his day 1 home exercise program Baseline: Started 02/17/2024 Goal status: Met 03/02/2024  2.  Improve right knee active range of motion to 0 - 5 - 105 degrees Baseline: 0 - 13 - 88 degrees Goal status: Ongoing   03/07/2024  3.  Improve right quadriceps strength as assessed by MMT Baseline: 2+/5 Goal status: Ongoing   7/72025   LONG TERM GOALS: Target date: 04/13/2024  Improve patient specific functional score to at least 7.2 Baseline: 5.2 Goal status:   Ongoing   7/72025  2.  Gregg Smith will report right knee pain consistently 0-3/10 on the numeric pain rating scale Baseline: 1.5 - 6.5/10 Goal status: Ongoing   7/72025  3.  Improve right knee active range of motion to 0 - 3 - 110 degrees or better Baseline: 0 - 13 - 88 degrees Goal status: Ongoing   7/72025  4.  Improve right quadriceps strength as assessed by Gregg Smith's ability to walk without the use of an assistive device and send and descend stairs reciprocally with use of a single handrail Baseline: Unable at evaluation Goal status: Ongoing   7/72025  5.  Gregg Smith will be independent and compliant with his long-term maintenance home exercise program at discharge Baseline: Started 02/17/2024 Goal status:  Ongoing   7/72025   PLAN:  PT FREQUENCY: 3x/week  PT DURATION: 8 weeks  PLANNED INTERVENTIONS: 97750- Physical Performance Testing, 97110-Therapeutic exercises, 97530- Therapeutic activity, 97112- Neuromuscular re-education, 97535- Self Care, [id number]- Manual therapy, [phone]- Gait training, [phone]- Vasopneumatic device, Patient/Family education, Balance training, Stair training, Joint mobilization, and Cryotherapy  PLAN FOR NEXT SESSION:    Check STGs.  Continue with extension active range of motion and strength, balance and gait. Monitor spO2.    Willma Obando April Ma L Ysabel Stankovich, PT, DPT 03/10/24 11:01 AM

## 2024-03-14 ENCOUNTER — Ambulatory Visit: Admitting: Emergency Medicine

## 2024-03-14 ENCOUNTER — Ambulatory Visit: Admitting: Rehabilitative and Restorative Service Providers"

## 2024-03-14 ENCOUNTER — Encounter: Payer: Self-pay | Admitting: Emergency Medicine

## 2024-03-14 ENCOUNTER — Encounter: Payer: Self-pay | Admitting: Rehabilitative and Restorative Service Providers"

## 2024-03-14 VITALS — BP 108/68 | HR 78 | Temp 98.5°F | Ht 71.0 in | Wt 235.0 lb

## 2024-03-14 DIAGNOSIS — Z96651 Presence of right artificial knee joint: Secondary | ICD-10-CM | POA: Diagnosis not present

## 2024-03-14 DIAGNOSIS — J849 Interstitial pulmonary disease, unspecified: Secondary | ICD-10-CM | POA: Diagnosis not present

## 2024-03-14 DIAGNOSIS — M25561 Pain in right knee: Secondary | ICD-10-CM | POA: Diagnosis not present

## 2024-03-14 DIAGNOSIS — N3281 Overactive bladder: Secondary | ICD-10-CM

## 2024-03-14 DIAGNOSIS — R600 Localized edema: Secondary | ICD-10-CM | POA: Diagnosis not present

## 2024-03-14 DIAGNOSIS — K219 Gastro-esophageal reflux disease without esophagitis: Secondary | ICD-10-CM

## 2024-03-14 DIAGNOSIS — R262 Difficulty in walking, not elsewhere classified: Secondary | ICD-10-CM

## 2024-03-14 DIAGNOSIS — J432 Centrilobular emphysema: Secondary | ICD-10-CM

## 2024-03-14 DIAGNOSIS — M25661 Stiffness of right knee, not elsewhere classified: Secondary | ICD-10-CM

## 2024-03-14 DIAGNOSIS — M5442 Lumbago with sciatica, left side: Secondary | ICD-10-CM

## 2024-03-14 DIAGNOSIS — I10 Essential (primary) hypertension: Secondary | ICD-10-CM

## 2024-03-14 DIAGNOSIS — I700 Atherosclerosis of aorta: Secondary | ICD-10-CM

## 2024-03-14 DIAGNOSIS — M6281 Muscle weakness (generalized): Secondary | ICD-10-CM

## 2024-03-14 DIAGNOSIS — G8929 Other chronic pain: Secondary | ICD-10-CM | POA: Diagnosis not present

## 2024-03-14 LAB — HYPERSENSITIVITY PNEUMONITIS
A. Pullulans Abs: NEGATIVE
A.Fumigatus #1 Abs: NEGATIVE
Micropolyspora faeni, IgG: NEGATIVE
Pigeon Serum Abs: NEGATIVE
Thermoact. Saccharii: NEGATIVE
Thermoactinomyces vulgaris, IgG: NEGATIVE

## 2024-03-14 MED ORDER — GABAPENTIN 300 MG PO CAPS: 300.0000 mg | ORAL_CAPSULE | Freq: Two times a day (BID) | ORAL | 1 refills | Status: DC

## 2024-03-14 NOTE — Assessment & Plan Note (Signed)
 Stable chronic condition.   Continues daily Trelegy.

## 2024-03-14 NOTE — Assessment & Plan Note (Signed)
 BP Readings from Last 3 Encounters:  03/14/24 108/68  03/09/24 116/68  02/04/24 125/71  Well-controlled hypertension Continue valsartan  hydrochlorothiazide  80-12.5 mg daily Cardiovascular risks associated with hypertension discussed

## 2024-03-14 NOTE — Assessment & Plan Note (Signed)
 Diagnosed with pulmonary fibrosis. Told his pulmonary function was doing well Continues Trelegy daily and albuterol  as needed Scheduled to follow-up with them soon

## 2024-03-14 NOTE — Assessment & Plan Note (Signed)
 Chronic stable condition Recently seen by GI and had upper endoscopy done Continues Prevacid 30 mg daily

## 2024-03-14 NOTE — Assessment & Plan Note (Signed)
Chronic stable condition Diet and nutrition discussed Continue rosuvastatin 10 mg daily

## 2024-03-14 NOTE — Therapy (Signed)
 OUTPATIENT PHYSICAL THERAPY TREATMENT / PROGRESS NOTE/ HUMANA RECERT   Patient Name: Gregg Smith MRN: [id number] DOB:03-20-1946, 78 y.o., male Today's Date: 03/14/2024  Progress Note Reporting Period 02/17/2024 to 03/14/2024  See note below for Objective Data and Assessment of Progress/Goals.      END OF SESSION:  PT End of Session - 03/14/24 1153     Visit Number 10    Number of Visits 22    Date for PT Re-Evaluation 04/25/24    Authorization Type HUMANA    Progress Note Due on Visit 20    PT Start Time 1149    PT Stop Time 1229    PT Time Calculation (min) 40 min    Activity Tolerance Patient tolerated treatment well;No increased pain    Behavior During Therapy WFL for tasks assessed/performed            Past Medical History:  Diagnosis Date   Allergy    seasonal    Anxiety    Arthritis    Barrett's esophagus    Cataract    Chicken pox    Colon polyps    hyperplastic   Diverticulosis    Emphysema lung (HCC)    GERD (gastroesophageal reflux disease)    Hemorrhoids    Hyperlipidemia    Hypertension    ILD (interstitial lung disease) (HCC)    12/18/23 Mild basilar fibrotic interstitial lung disease, 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   Procedure: LEFT TOTAL KNEE ARTHROPLASTY;  Surgeon: Donnice Car, MD;  Location: [facility];  Service: Orthopedics;  Laterality: Left;   TOTAL KNEE ARTHROPLASTY Right 02/03/2024   Procedure: ARTHROPLASTY, KNEE, TOTAL;  Surgeon: Harden Jerona GAILS, MD;  Location: [facility];  Service: Orthopedics;  Laterality: Right;   UPPER GASTROINTESTINAL ENDOSCOPY      Patient Active Problem List   Diagnosis Date Noted   Osteoarthritis (arthritis due to wear and tear of joints) 02/03/2024   Left leg weakness 11/03/2023   Osteoarthritis of lumbar spine with myelopathy 11/03/2023   Chronic pain of right knee 09/21/2023   Centrilobular emphysema (HCC) 06/04/2023   Interstitial lung disease (HCC) 06/04/2023   Chronic cough 02/03/2023   Overactive bladder 09/17/2022   Aortic atherosclerosis (HCC) 03/17/2022   Acute insomnia 10/23/2021   Chronic left-sided low back pain with left-sided sciatica 03/05/2021   Unilateral primary osteoarthritis, right knee 01/31/2021   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   Scoliosis deformity of spine 04/22/2019   Generalized anxiety disorder    Panic disorder    Insomnia    Spinal stenosis of lumbar region 11/30/2018   GERD (gastroesophageal reflux disease) 08/14/2017   S/P left TKA 08/11/2016   S/P knee replacement 08/11/2016   OSA (obstructive sleep apnea) 03/22/2014   Snoring 05/09/2013   Essential hypertension 02/11/2013   Dyslipidemia 02/11/2013   Arthritis 02/11/2013    PCP: Emil Aloysius Schaumann, MD  REFERRING PROVIDER: Maurilio CHRISTELLA Collet, PA-C  REFERRING DIAG: [phone] (ICD-10-CM) - Status post left  knee replacement   THERAPY DIAG:  Localized edema  Difficulty in walking, not elsewhere classified  Stiffness of right knee, not elsewhere classified  Muscle weakness (generalized)  Acute pain of right knee  Rationale for Evaluation and Treatment: Rehabilitation  ONSET DATE: February 03, 2024  SUBJECTIVE:   SUBJECTIVE STATEMENT: Pt indicated overall improvement to 80% at this time.     PERTINENT HISTORY: Arthritis, sciatica, emphysema, HLD, HTN, left TKA (~6 years ago), former 40-year smoker  PAIN:  NPRS scale: at worst 4/10.  Pain location: Rt knee Pain description: Achy, stiff, discomfort Aggravating factors: stretching end ranges, sometimes during day.  Relieving factors:  prescription meds, OTC meds.   PRECAUTIONS: Knee and Back  RED FLAGS: None   WEIGHT BEARING RESTRICTIONS: No  FALLS:  Has patient fallen in last 6 months? No  LIVING ENVIRONMENT: Lives with: lives with their family and lives with their son Lives in: House/apartment Stairs: Is doing stairs with the handrail and a cane Has following equipment at home: Single point cane and Environmental consultant - 2 wheeled  OCCUPATION: Retired PHD  PLOF: Independent  PATIENT GOALS: Be able to weed and garden, do yard work without knee or back restrictions  NEXT MD VISIT: 03/15/2024  OBJECTIVE:  Note: Objective measures were completed at Evaluation unless otherwise noted.  DIAGNOSTIC FINDINGS: Radiographs of the right knee show stable alignment of total knee  arthroplasty hardware.  There is no complicating feature.   PATIENT SURVEYS:  Patient-Specific Activity Scoring Scheme  0 represents "unable to perform." 10 represents "able to perform at prior level. 0 1 2 3 4 5 6 7 8 9  10 (Date and Score)   Activity Eval  02/17/2024  03/14/2024  1.  Sit on a low seat 5/10 8   2.  Clean the floor or garden 4/10  4.5  3.  Do laundry 4/10 8  4.  Take shower 6/10 9  5.  Work on laptop 7/10 10  Score 5.2  7.9   Total score = sum of the activity scores/number of activities Minimum detectable change (90%CI) for average score = 2 points Minimum detectable change (90%CI) for single activity score = 3 points     SENSATION: 02/17/2024 No complaints of peripheral pain or paresthesias  EDEMA:  02/17/2024 Noted and not objectively assessed   LOWER EXTREMITY ROM:  ROM Left/Right 02/17/2024 Right 02/22/24 Right  Rt 02/29/24 Right 03/02/2024 Right 03/07/24 Right 03/14/2024  Knee flexion 115/88 A: 104 P: 110 A: 98 AA: 108 Active 108 Active Seated 110* Seated heel slide AROM  105  Knee extension 0/-13 -9 (seated LAQ)  AA: -12 Active -7 Seated  LAQ -7* Supine ball roll -5* Seated LAQ AROM -5  Ankle dorsiflexion          Ankle plantarflexion         Ankle inversion         Ankle eversion          (Blank rows = not tested)  LOWER EXTREMITY MMT:  MMT Right 02/17/2024 Left 02/17/2024 Right 03/14/2024  Hip flexion   5/5  Hip extension     Hip abduction     Hip adduction     Hip internal rotation     Hip external rotation     Knee flexion   5/5  Knee extension 2+ 4+ 4/5  Ankle dorsiflexion     Ankle plantarflexion     Ankle inversion     Ankle eversion      (  Blank rows = not tested)  GAIT: 03/14/2024: Ambulation into clinic c SPC use.  Can perform household distances without cane per report.  Lacking TKE in stance for Rt leg.   Eval Distance walked: 100 feet Assistive device utilized: Walker - 2 wheeled Level of assistance: Complete Independence Comments: Salam was assistive device free before surgery                                                                                                                                                 TREATMENT          DATE: 03/14/2024 Therex: UBE LE only lvl 2.5 8 mins for ROM Seated quad set with SLR Rt leg 2 x 10  Incline gastroc stretch 30 sec x 3   Time spent in review of HEP updates from previous in clinic visits.  Encouraged continued consistent frequency.  Handout provided.    Neuro Re-ed(balance, muscle activation) Supine quad set review 5 sec hold x2 Seated quad set 5 sec hold x 10 Rt leg   TherActivity: Step up forward 4 inch step Rt WB with bilateral light hand assist on bar 2 x 10  Sit to stand to sit 18 inch chair s UE assist x 10  Blazepod bosu driving reaction reactive red/green 30 sec x 5    TREATMENT          DATE: 03/10/2024 Therapeutic Exercise: Scifit bike L4; 3 min fwd, 3 min bwd Seated self knee flexion 10x5 Seated knee flexion with PT overpressure x10 Seated hamstring curl red TB 2x10 Seated quad set foot on stool 2x10 Standing runner's stretch with terminal knee ext 2x30 Standing at counter hip  abduction red TB 2x10  Manual Therapy: Grade III to IV mobs for knee ext Grade II to IV MWM for knee ext in standing   Gait Training: Amb with SPC x 200' focusing on improving trunk/hip ext during stance, reducing R LE ER Amb without a/d x200' focusing on maintaining normal reciprocal pattern; however, pt demos R lateral lean during stance  Self Care: SpO2 checked throughout session due to concerns with pt's SHOB -- stable during amb at 98-100%; dropped to 94% with standing hip abd Discussed s/s of blood clots and to call MD if continued drop in spO2 at home   TREATMENT          DATE:03/07/2024 Therapeutic Exercise: recumbent bike seat 10 level 3 for 5 min.  Standing TKE green theraband 10 reps 1st set TKE only & 2nd set TKE + SLS tapping LLE to cone. Rail & cane support.  Gastroc incline BLEs 30 sec 3 reps. Heel raised on incline board 10 reps 2 sets Seated LAQ & active knee flexion with contralateral LE opposing motion 10 reps.   Supine heel slide with ball & strap knee flexion 5 sec hold & leg press /  knee ext 5 sec hold 10 reps.    Therapeutic Activities: Leg press BLEs 112# 15 reps with PT cues for full motion;  RLE 56# 10 reps  Manual Therapy RLE knee ext grade IV mob with belt standing on incline board Seated PROM Rt knee flexion with manual traction & tibial IR  Vaso Knee elevation with ext stretch medium compression 34* 10 min    TREATMENT          DATE:03/02/2024 Recumbent bike Seat 10 for 5 minutes (extension emphasis)  Quad sets with right heel prop 2 sets of 10 x 5 seconds Prone quadriceps stretch with strap over opposite shoulder (Right only) 5 x 20 seconds Prone knee extension stretch 3 minutes Seated knee extension stretch 3 minutes  Functional Activities: Double Leg Press with stretch into flexion/extension 100# 15 reps with slow eccentrics Single leg Press Right only same instructions as double leg press 50# 10 reps  Vaso Right Knee 10 minutes Medium  34*    PATIENT EDUCATION:  03/14/2024 Education details: HEP udate Person educated: Patient Education method: Programmer, multimedia, Facilities manager, Actor cues, Verbal cues, and Handouts Education comprehension: verbalized understanding, returned demonstration, verbal cues required, tactile cues required, and needs further education  HOME EXERCISE PROGRAM: Access Code: [email or web address] URL: [email or web address] Date: 03/14/2024 Prepared by: Ozell Silvan  Exercises - Supine Quadricep Sets  - 2-3 x daily - 7 x weekly - 1 sets - 10 reps - 5 second hold - Supine Heel Slide  - 2-3 x daily - 7 x weekly - 1-2 sets - 10 reps - 2 hold - Seated Knee Flexion AAROM  - 5 x daily - 7 x weekly - 1 sets - 10 reps - 10 seconds hold - Seated Passive Knee Extension  - 3 x daily - 7 x weekly - 1 sets - 1 reps - 3-5 hold - Seated Quad Set  - 2-3 x daily - 7 x weekly - 1 sets - 10 reps - 5 hold - Seated Straight Leg Heel Taps  - 1-2 x daily - 7 x weekly - 3 sets - 10 reps  ASSESSMENT:  CLINICAL IMPRESSION: The patient has attended 10 visits over the course of treatment cycle.  Patient has reported overall improvement at 80%. See objective data above for updated information regarding current presentation.  Gains have been noted in all areas with patient specific functional scale improvement noted.  Continued emphasis on extension gains for improved ambulation as well as functional strengthening.  Continued skilled PT services indicated on this time.    OBJECTIVE IMPAIRMENTS: Abnormal gait, decreased activity tolerance, decreased balance, decreased endurance, decreased knowledge of condition, difficulty walking, decreased ROM, decreased strength, increased edema, impaired perceived functional ability, and pain.   ACTIVITY LIMITATIONS: bending, standing, squatting, stairs, and locomotion level  PARTICIPATION LIMITATIONS: cleaning, laundry, driving, community activity, and yard work  PERSONAL FACTORS:  Arthritis, emphysema, HLD, HTN, left TKA, former 40-year smoker are also affecting patient's functional outcome.   REHAB POTENTIAL: Good  CLINICAL DECISION MAKING: Stable/uncomplicated  EVALUATION COMPLEXITY: Low   GOALS: Goals reviewed with patient? Yes  SHORT TERM GOALS: Target date: 03/16/2024 Hartman will be independent with his day 1 home exercise program Baseline: Started 02/17/2024 Goal status: Met 03/02/2024  2.  Improve right knee active range of motion to 0 - 5 - 105 degrees Baseline: 0 - 13 - 88 degrees Goal status: Ongoing   03/07/2024  3.  Improve right quadriceps strength as assessed by MMT Baseline: 2+/5 Goal status: Ongoing  [id number]/72025   LONG TERM GOALS: Target date: 04/25/2024  Improve patient specific functional score to at least 7.2 Baseline: 5.2 Goal status:   Ongoing   03/14/2024  2.  Abenezer will report right knee pain consistently 0-3/10 on the numeric pain rating scale Baseline: 1.5 - 6.5/10 Goal status: Ongoing   03/14/2024  3.  Improve right knee active range of motion to 0 - 3 - 110 degrees or better Baseline: 0 - 13 - 88 degrees Goal status: Ongoing   03/14/2024  4.  Improve right quadriceps strength as assessed by Yovan's ability to walk without the use of an assistive device and send and descend stairs reciprocally with use of a single handrail Baseline: Unable at evaluation Goal status: Ongoing   03/14/2024  5.  Draycen will be independent and compliant with his long-term maintenance home exercise program at discharge Baseline: Started 02/17/2024 Goal status:  Ongoing   03/14/2024   PLAN:  PT FREQUENCY: 2 x/week  PT DURATION: 6 weeks  PLANNED INTERVENTIONS: 97750- Physical Performance Testing, 97110-Therapeutic exercises, 97530- Therapeutic activity, 97112- Neuromuscular re-education, 97535- Self Care, 02859- Manual therapy, [phone]- Gait training, [phone]- Vasopneumatic device, Patient/Family education, Balance training, Stair training, Joint  mobilization, and Cryotherapy  PLAN FOR NEXT SESSION:     Continue functional WB strengthening, dynamic balance/compliant surface balance.    Ozell Silvan, PT, DPT, OCS, ATC 03/14/24  12:31 PM   Asking for 12 more visits with start date of 03/15/2024 to 04/25/2024  Referring diagnosis? S03.347 Treatment diagnosis? (if different than referring diagnosis) R26.2, R60.0, M62.81, M25.661, M25.561 What was this (referring dx) caused by? [x]  Surgery []  Fall []  Ongoing issue [x]  Arthritis []  Other: ____________   Laterality: [x]  Rt []  Lt []  Both   Check all possible CPT codes:                   *CHOOSE 10 OR LESS*                                See Planned Interventions listed in the Plan section of the Evaluation

## 2024-03-14 NOTE — Patient Instructions (Signed)
 Health Maintenance After Age 79 After age 4, you are at a higher risk for certain long-term diseases and infections as well as injuries from falls. Falls are a major cause of broken bones and head injuries in people who are older than age 47. Getting regular preventive care can help to keep you healthy and well. Preventive care includes getting regular testing and making lifestyle changes as recommended by your health care provider. Talk with your health care provider about: Which screenings and tests you should have. A screening is a test that checks for a disease when you have no symptoms. A diet and exercise plan that is right for you. What should I know about screenings and tests to prevent falls? Screening and testing are the best ways to find a health problem early. Early diagnosis and treatment give you the best chance of managing medical conditions that are common after age 37. Certain conditions and lifestyle choices may make you more likely to have a fall. Your health care provider may recommend: Regular vision checks. Poor vision and conditions such as cataracts can make you more likely to have a fall. If you wear glasses, make sure to get your prescription updated if your vision changes. Medicine review. Work with your health care provider to regularly review all of the medicines you are taking, including over-the-counter medicines. Ask your health care provider about any side effects that may make you more likely to have a fall. Tell your health care provider if any medicines that you take make you feel dizzy or sleepy. Strength and balance checks. Your health care provider may recommend certain tests to check your strength and balance while standing, walking, or changing positions. Foot health exam. Foot pain and numbness, as well as not wearing proper footwear, can make you more likely to have a fall. Screenings, including: Osteoporosis screening. Osteoporosis is a condition that causes  the bones to get weaker and break more easily. Blood pressure screening. Blood pressure changes and medicines to control blood pressure can make you feel dizzy. Depression screening. You may be more likely to have a fall if you have a fear of falling, feel depressed, or feel unable to do activities that you used to do. Alcohol use screening. Using too much alcohol can affect your balance and may make you more likely to have a fall. Follow these instructions at home: Lifestyle Do not drink alcohol if: Your health care provider tells you not to drink. If you drink alcohol: Limit how much you have to: 0-1 drink a day for women. 0-2 drinks a day for men. Know how much alcohol is in your drink. In the [place] one drink equals one 12 oz bottle of beer (355 mL), one 5 oz glass of wine (148 mL), or one 1 oz glass of hard liquor (44 mL). Do not use any products that contain nicotine or tobacco. These products include cigarettes, chewing tobacco, and vaping devices, such as e-cigarettes. If you need help quitting, ask your health care provider. Activity  Follow a regular exercise program to stay fit. This will help you maintain your balance. Ask your health care provider what types of exercise are appropriate for you. If you need a cane or walker, use it as recommended by your health care provider. Wear supportive shoes that have nonskid soles. Safety  Remove any tripping hazards, such as rugs, cords, and clutter. Install safety equipment such as grab bars in bathrooms and safety rails on stairs. Keep rooms and walkways  well-lit. General instructions Talk with your health care provider about your risks for falling. Tell your health care provider if: You fall. Be sure to tell your health care provider about all falls, even ones that seem minor. You feel dizzy, tiredness (fatigue), or off-balance. Take over-the-counter and prescription medicines only as told by your health care provider. These include  supplements. Eat a healthy diet and maintain a healthy weight. A healthy diet includes low-fat dairy products, low-fat (lean) meats, and fiber from whole grains, beans, and lots of fruits and vegetables. Stay current with your vaccines. Schedule regular health, dental, and eye exams. Summary Having a healthy lifestyle and getting preventive care can help to protect your health and wellness after age 11. Screening and testing are the best way to find a health problem early and help you avoid having a fall. Early diagnosis and treatment give you the best chance for managing medical conditions that are more common for people who are older than age 28. Falls are a major cause of broken bones and head injuries in people who are older than age 48. Take precautions to prevent a fall at home. Work with your health care provider to learn what changes you can make to improve your health and wellness and to prevent falls. This information is not intended to replace advice given to you by your health care provider. Make sure you discuss any questions you have with your health care provider. Document Revised: 01/07/2021 Document Reviewed: 01/07/2021 Elsevier Patient Education  2024 [employer].

## 2024-03-14 NOTE — Assessment & Plan Note (Signed)
 Acting up lately. Recommend to restart gabapentin  300 mg twice a day New prescription sent to pharmacy of record today

## 2024-03-14 NOTE — Assessment & Plan Note (Signed)
Stable Continue Gemtesa 75 mg daily and tamsulosin 0.4 mg daily

## 2024-03-14 NOTE — Assessment & Plan Note (Signed)
 Recovering well.  Still has some residual swelling on right knee Starting to get off Percocet

## 2024-03-14 NOTE — Progress Notes (Signed)
 Gregg Smith 78 y.o.   Chief Complaint  Patient presents with   Follow-up    Patient here for 6 month f/u for HTN. Patient states his sciatica has been acting up. Pt also mentions recovering from his right total knee replacement. He is also wanting to go over pulmonary lab results.     HISTORY OF PRESENT ILLNESS: This is a 78 y.o. male here for 65-month follow-up of multiple chronic medical conditions Accompanied by son Gregg Smith today. Has questions about pulmonary fibrosis Status post right knee total replacement.  Recovering well. Recent labs reviewed with patient.  Positive ANA titers. Recent pulmonary office visit assessment and plan as follows:  Assessment & Plan:    Pulmonary fibrosis (HCC) - Plan: ANA, ANCA screen with reflex titer, Anti-Smith antibody, Centromere Antibodies, Cyclic citrul peptide antibody, IgG, Hypersensitivity Pneumonitis, Rheumatoid factor, Sjogren's syndrome antibods(ssa + ssb), CK, Aldolase, ANA, ANCA screen with reflex titer, Anti-Smith antibody, Centromere Antibodies, Cyclic citrul peptide antibody, IgG, Hypersensitivity Pneumonitis, Rheumatoid factor, Sjogren's syndrome antibods(ssa + ssb), CK, Aldolase   Centrilobular emphysema (HCC)   OSA (obstructive sleep apnea)   Discussion: Gregg Smith is a 78 year old male, former smoker with history of GERD, hypertension, seasonal allergies and OSA who returns to pulmonary clinic for emphysema and pulmonary fibrosis.   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 Mild scarring primarily in the lower lobes. Pulmonary function tests normal. Discussed progressive nature of pulmonary fibrosis and role of antifibrotic therapy in slowing disease progression by approximately 50%. Emphasized early intervention to preserve lung function. Discussed potential side effects of antifibrotic medications. Smoking history is  likely etiology of pulmonary fibrosis. - Schedule appointment with pharmacist to discuss antifibrotic therapy options and start therapy - Order autoimmune panel    Follow up in 3 months     Gregg Chill, MD [name]: [phone]  HPI   Prior to Admission medications   Medication Sig Start Date End Date Taking? Authorizing Provider  albuterol  (VENTOLIN  HFA) 108 (90 Base) MCG/ACT inhaler Inhale 2 puffs into the lungs every 6 (six) hours as needed for wheezing or shortness of breath. 12/07/23  Yes Smith Gregg NOVAK, MD  eszopiclone (LUNESTA) 2 MG TABS tablet Take 1 mg by mouth at bedtime. 11/12/22  Yes [provider]  Fluticasone -Umeclidin-Vilant (TRELEGY ELLIPTA ) 100-62.5-25 MCG/ACT AEPB Inhale 1 puff into the lungs daily. 06/04/23  Yes Zohar Maroney, Emil Schanz, MD  GEMTESA  75 MG TABS TAKE 75 MG BY MOUTH DAILY. 12/10/23  Yes Okie Jansson, Emil Schanz, MD  L-Theanine 200 MG CAPS Take 200 mg by mouth daily.   Yes [provider]  lansoprazole  (PREVACID ) 30 MG capsule TAKE 1 CAPSULE (30 MG TOTAL) BY MOUTH 2 (TWO) TIMES DAILY BEFORE A MEAL. 12/09/23  Yes Abran Norleen SAILOR, MD  Milk Thistle 1000 MG CAPS Take 1,000 mg by mouth daily.   Yes [provider]  Multiple Vitamin (MULTIVITAMIN) tablet Take 1 tablet by mouth daily.   Yes [provider]  OVER THE COUNTER MEDICATION Take 1 tablet by mouth daily. Calm Aid   Yes [provider]  oxyCODONE -acetaminophen  (PERCOCET/ROXICET) 5-325 MG tablet Take 1 tablet by mouth every 6 (six) hours as needed. 02/12/24  Yes Jule Ronal CROME, PA-C  rosuvastatin  (CRESTOR ) 10 MG tablet TAKE 1 TABLET BY MOUTH EVERY DAY 07/22/23  Yes Tauheedah Bok, Emil Schanz, MD  tamsulosin  (FLOMAX ) 0.4 MG CAPS capsule TAKE 1 CAPSULE BY MOUTH DAILY AT  NIGHT. 03/05/21  Yes Klaudia Beirne, Emil Schanz, MD  valsartan -hydrochlorothiazide  (DIOVAN -HCT) 80-12.5 MG tablet TAKE 1 TABLET BY MOUTH EVERY DAY 08/06/23  Yes Sadey Yandell, Emil Schanz, MD    Allergies  Allergen Reactions   Penicillins Hives and Rash    Has patient had a PCN reaction causing immediate rash, facial/tongue/throat swelling, SOB or lightheadedness with hypotension:unsure Has patient had a PCN reaction causing severe rash involving mucus membranes or skin necrosis:unsure Has patient had a PCN reaction that required hospitalization:No Has patient had a PCN reaction occurring within the last 10 years:No If all of the above answers are NO, then may proceed with Cephalosporin use. 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   Chronic pain of right knee 09/21/2023   Centrilobular emphysema (HCC) 06/04/2023   Interstitial lung disease (HCC) 06/04/2023   Chronic cough 02/03/2023   Overactive bladder 09/17/2022   Aortic atherosclerosis (HCC) 03/17/2022   Acute insomnia 10/23/2021   Chronic left-sided low back pain with left-sided sciatica 03/05/2021   Unilateral primary osteoarthritis, right knee 01/31/2021   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   Scoliosis deformity of spine 04/22/2019   Generalized anxiety disorder    Panic disorder    Insomnia    Spinal stenosis of lumbar region 11/30/2018   GERD (gastroesophageal reflux disease) 08/14/2017   S/P left TKA 08/11/2016   S/P knee replacement 08/11/2016   OSA (obstructive sleep apnea) 03/22/2014   Snoring 05/09/2013   Essential hypertension 02/11/2013   Dyslipidemia 02/11/2013   Arthritis 02/11/2013    Past Medical History:  Diagnosis Date   Allergy    seasonal    Anxiety    Arthritis    Barrett's esophagus    Cataract    Chicken pox    Colon polyps    hyperplastic   Diverticulosis    Emphysema lung (HCC)    GERD (gastroesophageal reflux disease)    Hemorrhoids     Hyperlipidemia    Hypertension    ILD (interstitial lung disease) (HCC)    12/18/23 Mild basilar fibrotic interstitial lung disease, 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   Insomnia    Leg cramps    Measles    Mumps    Obstructive sleep apnea    Pneumonia    Sleep apnea    dental device; no longer uses    Past Surgical History:  Procedure Laterality Date   APPENDECTOMY  1957   CATARACT EXTRACTION     COLONOSCOPY     JOINT REPLACEMENT     TONSILLECTOMY     TONSILLECTOMY AND ADENOIDECTOMY  1954   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   Procedure: LEFT TOTAL KNEE ARTHROPLASTY;  Surgeon: Donnice Car, MD;  Location: [facility];  Service: Orthopedics;  Laterality: Left;   TOTAL KNEE ARTHROPLASTY Right 02/03/2024   Procedure: ARTHROPLASTY, KNEE, TOTAL;  Surgeon: Harden Jerona GAILS, MD;  Location: [facility] OR;  Service: Orthopedics;  Laterality: Right;   UPPER GASTROINTESTINAL ENDOSCOPY      Social History   Socioeconomic History   Marital status: Widowed    Spouse name: [name]   Number of children: 1   Years of education: PHD   Highest education level: Doctorate  Occupational History   Occupation: Retired    Comment: Loss adjuster, chartered:  [employer]  Tobacco Use   Smoking status: Former    Current packs/day: 0.00    Average packs/day: 1 pack/day for 40.0 years (40.0 ttl pk-yrs)    Types: Cigarettes    Start date: 01/19/1967    Quit date: 01/19/2007    Years since quitting: 17.1   Smokeless tobacco: Never  Vaping Use   Vaping status: Never Used  Substance and Sexual Activity   Alcohol use: Yes    Alcohol/week: 3.0 standard drinks of alcohol    Types: 3 Cans of beer per week   Drug use: No   Sexual activity: Not on file  Other Topics Concern   Not on file  Social History Narrative   Patient lives alone in his home   Patient is retired   Patient has a Ph.D   Patient has one adult child.   Patient is right-handed.   Patient drinks 1 cup of tea  and 1 cup of coffee daily    Social Drivers of Health   Financial Resource Strain: Low Risk  (09/20/2023)   Overall Financial Resource Strain (CARDIA)    Difficulty of Paying Living Expenses: Not hard at all  Food Insecurity: No Food Insecurity (02/04/2024)   Hunger Vital Sign    Worried About Running Out of Food in the Last Year: Never true    Ran Out of Food in the Last Year: Never true  Transportation Needs: No Transportation Needs (02/04/2024)   PRAPARE - Administrator, Civil Service (Medical): No    Lack of Transportation (Non-Medical): No  Physical Activity: Sufficiently Active (09/20/2023)   Exercise Vital Sign    Days of Exercise per Week: 4 days    Minutes of Exercise per Session: 50 min  Stress: No Stress Concern Present (09/20/2023)   [employer] of Occupational Health - Occupational Stress Questionnaire    Feeling of Stress : Only a little  Social Connections: Moderately Integrated (02/04/2024)   Social Connection and Isolation Panel    Frequency of Communication with Friends and Family: More than three times a week    Frequency of Social Gatherings with Friends and Family: More than three times a week    Attends Religious Services: More than 4 times per year    Active Member of [employer] or Organizations: Yes    Attends Banker Meetings: More than 4 times per year    Marital Status: Widowed  Intimate Partner Violence: Not At Risk (02/04/2024)   Humiliation, Afraid, Rape, and Kick questionnaire    Fear of Current or Ex-Partner: No    Emotionally Abused: No    Physically Abused: No    Sexually Abused: No    Family History  Problem Relation Age of Onset   Alzheimer's disease Mother    Cancer Father    Stomach cancer Father    Aortic aneurysm Son    Heart disease Son    Cancer Maternal Grandmother    Heart disease Maternal Grandfather    Stomach cancer Paternal Grandfather    Colon cancer Neg Hx    Colon polyps Neg Hx    Esophageal cancer Neg  Hx    Rectal cancer Neg Hx    Pancreatic cancer Neg Hx    Liver cancer Neg Hx      Review of Systems  Constitutional: Negative.  Negative for chills and fever.  HENT: Negative.  Negative for congestion and sore throat.   Respiratory: Negative.  Negative for cough  and shortness of breath.   Cardiovascular: Negative.  Negative for chest pain and palpitations.  Gastrointestinal:  Negative for abdominal pain, diarrhea, nausea and vomiting.  Genitourinary: Negative.  Negative for dysuria and hematuria.  Skin: Negative.  Negative for rash.  Neurological: Negative.  Negative for dizziness and headaches.  All other systems reviewed and are negative.   Vitals:   03/14/24 1427  BP: 108/68  Pulse: 78  Temp: 98.5 F (36.9 C)  SpO2: 98%    Physical Exam Vitals reviewed.  Constitutional:      Appearance: Normal appearance.  HENT:     Head: Normocephalic.  Eyes:     Extraocular Movements: Extraocular movements intact.  Cardiovascular:     Rate and Rhythm: Normal rate and regular rhythm.     Pulses: Normal pulses.     Heart sounds: Normal heart sounds.  Pulmonary:     Effort: Pulmonary effort is normal.     Breath sounds: Normal breath sounds.  Skin:    General: Skin is warm and dry.     Capillary Refill: Capillary refill takes less than 2 seconds.  Neurological:     General: No focal deficit present.     Mental Status: He is alert and oriented to person, place, and time.  Psychiatric:        Mood and Affect: Mood normal.        Behavior: Behavior normal.      ASSESSMENT & PLAN: A total of 45 minutes was spent with the patient and counseling/coordination of care regarding preparing for this visit, review of most recent office visit notes, review of multiple chronic medical conditions and their management, review of all medications, review of most recent bloodwork results, review of health maintenance items, education on nutrition, prognosis, documentation, and need for follow  up.   Problem List Items Addressed This Visit       Cardiovascular and Mediastinum   Essential hypertension   BP Readings from Last 3 Encounters:  03/14/24 108/68  03/09/24 116/68  02/04/24 125/71  Well-controlled hypertension Continue valsartan  hydrochlorothiazide  80-12.5 mg daily Cardiovascular risks associated with hypertension discussed         Aortic atherosclerosis (HCC)   Chronic stable condition Diet and nutrition discussed Continue rosuvastatin  10 mg daily        Respiratory   Centrilobular emphysema (HCC)   Stable chronic condition Continues daily Trelegy      Interstitial lung disease (HCC) - Primary   Diagnosed with pulmonary fibrosis. Told his pulmonary function was doing well Continues Trelegy daily and albuterol  as needed Scheduled to follow-up with them soon        Digestive   GERD (gastroesophageal reflux disease)   Chronic stable condition Recently seen by GI and had upper endoscopy done Continues Prevacid  30 mg daily        Nervous and Auditory   Chronic left-sided low back pain with left-sided sciatica   Acting up lately. Recommend to restart gabapentin  300 mg twice a day New prescription sent to pharmacy of record today      Relevant Medications   gabapentin  (NEURONTIN ) 300 MG capsule     Genitourinary   Overactive bladder   Stable Continue Gemtesa  75 mg daily and tamsulosin  0.4 mg daily        Other   S/P knee replacement   Recovering well.  Still has some residual swelling on right knee Starting to get off Percocet        Patient Instructions  Health  Maintenance After Age 17 After age 17, you are at a higher risk for certain long-term diseases and infections as well as injuries from falls. Falls are a major cause of broken bones and head injuries in people who are older than age 66. Getting regular preventive care can help to keep you healthy and well. Preventive care includes getting regular testing and making  lifestyle changes as recommended by your health care provider. Talk with your health care provider about: Which screenings and tests you should have. A screening is a test that checks for a disease when you have no symptoms. A diet and exercise plan that is right for you. What should I know about screenings and tests to prevent falls? Screening and testing are the best ways to find a health problem early. Early diagnosis and treatment give you the best chance of managing medical conditions that are common after age 78. Certain conditions and lifestyle choices may make you more likely to have a fall. Your health care provider may recommend: Regular vision checks. Poor vision and conditions such as cataracts can make you more likely to have a fall. If you wear glasses, make sure to get your prescription updated if your vision changes. Medicine review. Work with your health care provider to regularly review all of the medicines you are taking, including over-the-counter medicines. Ask your health care provider about any side effects that may make you more likely to have a fall. Tell your health care provider if any medicines that you take make you feel dizzy or sleepy. Strength and balance checks. Your health care provider may recommend certain tests to check your strength and balance while standing, walking, or changing positions. Foot health exam. Foot pain and numbness, as well as not wearing proper footwear, can make you more likely to have a fall. Screenings, including: Osteoporosis screening. Osteoporosis is a condition that causes the bones to get weaker and break more easily. Blood pressure screening. Blood pressure changes and medicines to control blood pressure can make you feel dizzy. Depression screening. You may be more likely to have a fall if you have a fear of falling, feel depressed, or feel unable to do activities that you used to do. Alcohol use screening. Using too much alcohol can  affect your balance and may make you more likely to have a fall. Follow these instructions at home: Lifestyle Do not drink alcohol if: Your health care provider tells you not to drink. If you drink alcohol: Limit how much you have to: 0-1 drink a day for women. 0-2 drinks a day for men. Know how much alcohol is in your drink. In the [place] one drink equals one 12 oz bottle of beer (355 mL), one 5 oz glass of wine (148 mL), or one 1 oz glass of hard liquor (44 mL). Do not use any products that contain nicotine or tobacco. These products include cigarettes, chewing tobacco, and vaping devices, such as e-cigarettes. If you need help quitting, ask your health care provider. Activity  Follow a regular exercise program to stay fit. This will help you maintain your balance. Ask your health care provider what types of exercise are appropriate for you. If you need a cane or walker, use it as recommended by your health care provider. Wear supportive shoes that have nonskid soles. Safety  Remove any tripping hazards, such as rugs, cords, and clutter. Install safety equipment such as grab bars in bathrooms and safety rails on stairs. Keep rooms and walkways  well-lit. General instructions Talk with your health care provider about your risks for falling. Tell your health care provider if: You fall. Be sure to tell your health care provider about all falls, even ones that seem minor. You feel dizzy, tiredness (fatigue), or off-balance. Take over-the-counter and prescription medicines only as told by your health care provider. These include supplements. Eat a healthy diet and maintain a healthy weight. A healthy diet includes low-fat dairy products, low-fat (lean) meats, and fiber from whole grains, beans, and lots of fruits and vegetables. Stay current with your vaccines. Schedule regular health, dental, and eye exams. Summary Having a healthy lifestyle and getting preventive care can help to protect  your health and wellness after age 35. Screening and testing are the best way to find a health problem early and help you avoid having a fall. Early diagnosis and treatment give you the best chance for managing medical conditions that are more common for people who are older than age 73. Falls are a major cause of broken bones and head injuries in people who are older than age 38. Take precautions to prevent a fall at home. Work with your health care provider to learn what changes you can make to improve your health and wellness and to prevent falls. This information is not intended to replace advice given to you by your health care provider. Make sure you discuss any questions you have with your health care provider. Document Revised: 01/07/2021 Document Reviewed: 01/07/2021 Elsevier Patient Education  2024 Elsevier Inc.    Emil Schaumann, MD [facility]

## 2024-03-15 ENCOUNTER — Ambulatory Visit: Admitting: Family

## 2024-03-15 DIAGNOSIS — G4700 Insomnia, unspecified: Secondary | ICD-10-CM | POA: Diagnosis not present

## 2024-03-15 DIAGNOSIS — Z96651 Presence of right artificial knee joint: Secondary | ICD-10-CM

## 2024-03-15 DIAGNOSIS — F411 Generalized anxiety disorder: Secondary | ICD-10-CM | POA: Diagnosis not present

## 2024-03-15 NOTE — Progress Notes (Unsigned)
 Office Visit Note   Patient: Gregg Smith           Date of Birth: August 21, 1946           MRN: [id number] Visit Date: 03/15/2024              Requested by: Purcell Emil Schanz, MD [address] [place],  [place] [address] PCP: Purcell Emil Schanz, MD  No chief complaint on file.     HPI: The patient is a 78 year old gentleman who is seen status post total knee arthroplasty on the right.  He continues with physical therapy feels he is progressing moderately well he continues to have stiffness and aching pain some paresthesias to the lateral knee.  He has felt that he has achieved adequate flexion however he lacks some full extension  Assessment & Plan: Visit Diagnoses: No diagnosis found.  Plan: Encouraged him to continue with physical therapy discussed aggressive flexion and extension.  Recommended continued home exercise program and gait and balance training.  Discussed manipulation under anesthesia.  Patient would like to continue with conservative measures  Follow-Up Instructions: No follow-ups on file.   Ortho Exam  Patient is alert, oriented, no adenopathy, well-dressed, normal affect, normal respiratory effort. Examination right knee his incision is well-healed he continues to have mild to moderate edema no erythema no warmth no effusion he lacks about 10 degrees full extension    Imaging: No results found. No images are attached to the encounter.  Labs: Lab Results  Component Value Date   HGBA1C 5.5 03/18/2023   HGBA1C 5.5 09/17/2022   HGBA1C 5.3 04/11/2019   LABURIC 6.6 06/12/2023   GRAMSTAIN Rare 08/14/2012   GRAMSTAIN WBC present-both PMN and Mononuclear 08/14/2012   GRAMSTAIN No Squamous Epithelial Cells Seen 08/14/2012   GRAMSTAIN Few Gram Negative Rods 08/14/2012   GRAMSTAIN Few Gram Positive Cocci In Pairs In Chains 08/14/2012   LABORGA Multiple Organisms Present,None Predominant 08/14/2012     Lab Results  Component Value Date   ALBUMIN 3.8  03/18/2023   ALBUMIN 3.9 08/13/2022   ALBUMIN 3.8 09/05/2021    Lab Results  Component Value Date   MG 2.0 08/13/2022   No results found for: VD25OH  No results found for: PREALBUMIN    Latest Ref Rng & Units 01/21/2024    9:02 AM 03/18/2023   10:04 AM 09/17/2022   10:16 AM  CBC EXTENDED  WBC 4.0 - 10.5 K/uL 6.1  5.4  7.6   RBC 4.22 - 5.81 MIL/uL 4.99  4.88  5.08   Hemoglobin 13.0 - 17.0 g/dL 85.4  86.0  85.1   HCT 39.0 - 52.0 % 43.8  41.7  42.6   Platelets 150 - 400 K/uL 174  191.0  191.0   NEUT# 1.4 - 7.7 K/uL  3.6  5.4   Lymph# 0.7 - 4.0 K/uL  0.9  0.9      There is no height or weight on file to calculate BMI.  Orders:  No orders of the defined types were placed in this encounter.  No orders of the defined types were placed in this encounter.    Procedures: No procedures performed  Clinical Data: No additional findings.  ROS:  All other systems negative, except as noted in the HPI. Review of Systems  Objective: Vital Signs: There were no vitals taken for this visit.  Specialty Comments:  No specialty comments available.  PMFS History: Patient Active Problem List   Diagnosis Date Noted   Osteoarthritis (  arthritis due to wear and tear of joints) 02/03/2024   Left leg weakness 11/03/2023   Osteoarthritis of lumbar spine with myelopathy 11/03/2023   Chronic pain of right knee 09/21/2023   Centrilobular emphysema (HCC) 06/04/2023   Interstitial lung disease (HCC) 06/04/2023   Chronic cough 02/03/2023   Overactive bladder 09/17/2022   Aortic atherosclerosis (HCC) 03/17/2022   Acute insomnia 10/23/2021   Chronic left-sided low back pain with left-sided sciatica 03/05/2021   Unilateral primary osteoarthritis, right knee 01/31/2021   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   Scoliosis deformity of spine 04/22/2019   Generalized anxiety disorder    Panic disorder    Insomnia    Spinal stenosis of lumbar region 11/30/2018   GERD  (gastroesophageal reflux disease) 08/14/2017   S/P left TKA 08/11/2016   S/P knee replacement 08/11/2016   OSA (obstructive sleep apnea) 03/22/2014   Snoring 05/09/2013   Essential hypertension 02/11/2013   Dyslipidemia 02/11/2013   Arthritis 02/11/2013   Past Medical History:  Diagnosis Date   Allergy    seasonal    Anxiety    Arthritis    Barrett's esophagus    Cataract    Chicken pox    Colon polyps    hyperplastic   Diverticulosis    Emphysema lung (HCC)    GERD (gastroesophageal reflux disease)    Hemorrhoids    Hyperlipidemia    Hypertension    ILD (interstitial lung disease) (HCC)    12/18/23 Mild basilar fibrotic interstitial lung disease, 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   Insomnia    Leg cramps    Measles    Mumps    Obstructive sleep apnea    Pneumonia    Sleep apnea    dental device; no longer uses    Family History  Problem Relation Age of Onset   Alzheimer's disease Mother    Cancer Father    Stomach cancer Father    Aortic aneurysm Son    Heart disease Son    Cancer Maternal Grandmother    Heart disease Maternal Grandfather    Stomach cancer Paternal Grandfather    Colon cancer Neg Hx    Colon polyps Neg Hx    Esophageal cancer Neg Hx    Rectal cancer Neg Hx    Pancreatic cancer Neg Hx    Liver cancer Neg Hx     Past Surgical History:  Procedure Laterality Date   APPENDECTOMY  1957   CATARACT EXTRACTION     COLONOSCOPY     JOINT REPLACEMENT     TONSILLECTOMY     TONSILLECTOMY AND ADENOIDECTOMY  1954   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   Procedure: LEFT TOTAL KNEE ARTHROPLASTY;  Surgeon: Donnice Car, MD;  Location: [facility];  Service: Orthopedics;  Laterality: Left;   TOTAL KNEE ARTHROPLASTY Right 02/03/2024   Procedure: ARTHROPLASTY, KNEE, TOTAL;  Surgeon: Harden Jerona GAILS, MD;  Location: [facility] OR;  Service: Orthopedics;  Laterality: Right;   UPPER GASTROINTESTINAL ENDOSCOPY     Social History   Occupational History    Occupation: Retired    Comment: Loss adjuster, chartered: [employer]  Tobacco Use   Smoking status: Former    Current packs/day: 0.00    Average packs/day: 1 pack/day for 40.0 years (40.0 ttl pk-yrs)    Types: Cigarettes    Start date: 01/19/1967    Quit date: 01/19/2007    Years since quitting: 17.1   Smokeless tobacco:  Never  Vaping Use   Vaping status: Never Used  Substance and Sexual Activity   Alcohol use: Yes    Alcohol/week: 3.0 standard drinks of alcohol    Types: 3 Cans of beer per week   Drug use: No   Sexual activity: Not on file

## 2024-03-16 ENCOUNTER — Ambulatory Visit: Admitting: Rehabilitative and Restorative Service Providers"

## 2024-03-16 ENCOUNTER — Encounter: Payer: Self-pay | Admitting: Rehabilitative and Restorative Service Providers"

## 2024-03-16 ENCOUNTER — Ambulatory Visit: Admitting: Pharmacist

## 2024-03-16 DIAGNOSIS — J8410 Pulmonary fibrosis, unspecified: Secondary | ICD-10-CM

## 2024-03-16 DIAGNOSIS — M25661 Stiffness of right knee, not elsewhere classified: Secondary | ICD-10-CM

## 2024-03-16 DIAGNOSIS — M6281 Muscle weakness (generalized): Secondary | ICD-10-CM | POA: Diagnosis not present

## 2024-03-16 DIAGNOSIS — J849 Interstitial pulmonary disease, unspecified: Secondary | ICD-10-CM

## 2024-03-16 DIAGNOSIS — Z5181 Encounter for therapeutic drug level monitoring: Secondary | ICD-10-CM | POA: Diagnosis not present

## 2024-03-16 DIAGNOSIS — R600 Localized edema: Secondary | ICD-10-CM | POA: Diagnosis not present

## 2024-03-16 DIAGNOSIS — M25561 Pain in right knee: Secondary | ICD-10-CM

## 2024-03-16 DIAGNOSIS — Z7189 Other specified counseling: Secondary | ICD-10-CM | POA: Diagnosis not present

## 2024-03-16 DIAGNOSIS — R262 Difficulty in walking, not elsewhere classified: Secondary | ICD-10-CM

## 2024-03-16 LAB — CENTROMERE ANTIBODIES: Centromere Ab Screen: <1 AI

## 2024-03-16 LAB — COMPREHENSIVE METABOLIC PANEL WITH GFR
ALT: 16 U/L (ref 0–53)
AST: 25 U/L (ref 0–37)
Albumin: 3.8 g/dL (ref 3.5–5.2)
Alkaline Phosphatase: 82 U/L (ref 39–117)
BUN: 22 mg/dL (ref 6–23)
CO2: 28 meq/L (ref 19–32)
Calcium: 9.3 mg/dL (ref 8.4–10.5)
Chloride: 106 meq/L (ref 96–112)
Creatinine, Ser: 0.95 mg/dL (ref 0.40–1.50)
GFR: 77.14 mL/min (ref >60.00)
Glucose, Bld: 112 mg/dL — ABNORMAL HIGH (ref 70–99)
Potassium: 3.9 meq/L (ref 3.5–5.1)
Sodium: 139 meq/L (ref 135–145)
Total Bilirubin: 0.4 mg/dL (ref 0.2–1.2)
Total Protein: 7.2 g/dL (ref 6.0–8.3)

## 2024-03-16 LAB — RFLX ATYPICAL P-ANCA TITER: ATYPICAL P ANCA TITER: 1:320 {titer} — ABNORMAL HIGH

## 2024-03-16 LAB — ANTI-NUCLEAR AB-TITER (ANA TITER)
ANA TITER: 1:80 {titer} — ABNORMAL HIGH
ANA Titer 1: 1:640 {titer} — ABNORMAL HIGH

## 2024-03-16 LAB — ANTI-SMITH ANTIBODY: ENA SM Ab Ser-aCnc: <1 AI

## 2024-03-16 LAB — ALDOLASE: Aldolase: 3.8 U/L (ref <=8.1)

## 2024-03-16 LAB — ANA: Anti Nuclear Antibody (ANA): POSITIVE — AB

## 2024-03-16 LAB — SJOGREN'S SYNDROME ANTIBODS(SSA + SSB)
SSA (Ro) (ENA) Antibody, IgG: <1 AI
SSB (La) (ENA) Antibody, IgG: <1 AI

## 2024-03-16 LAB — ANCA SCREEN W REFLEX TITER: ANCA SCREEN: POSITIVE — AB

## 2024-03-16 LAB — CYCLIC CITRUL PEPTIDE ANTIBODY, IGG: Cyclic Citrullin Peptide Ab: <16 U

## 2024-03-16 LAB — RHEUMATOID FACTOR: Rheumatoid fact SerPl-aCnc: <10 [IU]/mL (ref <14)

## 2024-03-16 NOTE — Therapy (Addendum)
 OUTPATIENT PHYSICAL THERAPY TREATMENT   Patient Name: Gregg Smith MRN: [id number] DOB:[birth year], 78 y.o., male Today's Date: 03/16/2024   END OF SESSION:  PT End of Session - 03/16/24 0945     Visit Number 11    Number of Visits 22    Date for PT Re-Evaluation 04/25/24    Authorization Type [employer]    Authorization Time Period 03/15/2024 - 04/25/2024    Authorization - Visit Number 1    Authorization - Number of Visits 12    Progress Note Due on Visit 20    PT Start Time 0939    PT Stop Time 1020    PT Time Calculation (min) 41 min    Activity Tolerance Patient tolerated treatment well;No increased pain    Behavior During Therapy WFL for tasks assessed/performed             Past Medical History:  Diagnosis Date   Allergy    seasonal    Anxiety    Arthritis    Barrett's esophagus    Cataract    Chicken pox    Colon polyps    hyperplastic   Diverticulosis    Emphysema lung (HCC)    GERD (gastroesophageal reflux disease)    Hemorrhoids    Hyperlipidemia    Hypertension    ILD (interstitial lung disease) (HCC)    12/18/23 Mild basilar fibrotic interstitial lung disease, 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   Procedure: LEFT TOTAL KNEE ARTHROPLASTY;  Surgeon: Donnice Car, MD;  Location: [facility];  Service: Orthopedics;  Laterality: Left;   TOTAL KNEE ARTHROPLASTY Right 02/03/2024   Procedure: ARTHROPLASTY, KNEE, TOTAL;  Surgeon: Harden Jerona GAILS, MD;  Location: [facility] OR;  Service: Orthopedics;  Laterality: Right;   UPPER GASTROINTESTINAL ENDOSCOPY     Patient Active Problem List    Diagnosis Date Noted   Osteoarthritis (arthritis due to wear and tear of joints) 02/03/2024   Left leg weakness 11/03/2023   Osteoarthritis of lumbar spine with myelopathy 11/03/2023   Chronic pain of right knee 09/21/2023   Centrilobular emphysema (HCC) 06/04/2023   Interstitial lung disease (HCC) 06/04/2023   Chronic cough 02/03/2023   Overactive bladder 09/17/2022   Aortic atherosclerosis (HCC) 03/17/2022   Acute insomnia 10/23/2021   Chronic left-sided low back pain with left-sided sciatica 03/05/2021   Unilateral primary osteoarthritis, right knee 01/31/2021   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   Scoliosis deformity of spine 04/22/2019   Generalized anxiety disorder    Panic disorder    Insomnia    Spinal stenosis of lumbar region 11/30/2018   GERD (gastroesophageal reflux disease) 08/14/2017   S/P left TKA 08/11/2016   S/P knee replacement 08/11/2016   OSA (obstructive sleep apnea) 03/22/2014   Snoring 05/09/2013   Essential hypertension 02/11/2013   Dyslipidemia 02/11/2013   Arthritis 02/11/2013    PCP: Emil Aloysius Schaumann, MD  REFERRING PROVIDER: Harden Jerona GAILS, MD   REFERRING DIAG:  M17.11 (ICD-10-CM) - Primary  osteoarthritis of right knee  M17.11 (ICD-10-CM) - Unilateral primary osteoarthritis, right knee  Z96.651 (ICD-10-CM) - Status post right knee replacement    THERAPY DIAG:  Localized edema  Difficulty in walking, not elsewhere classified  Stiffness of right knee, not elsewhere classified  Muscle weakness (generalized)  Acute pain of right knee  Rationale for Evaluation and Treatment: Rehabilitation  ONSET DATE: February 03, 2024  SUBJECTIVE:   SUBJECTIVE STATEMENT: Pt indicated having a good visit with PA.     PERTINENT HISTORY: Arthritis, sciatica, emphysema, HLD, HTN, left TKA (~6 years ago), former 40-year smoker  PAIN:  NPRS scale: at worst 4/10.  Pain location: Rt knee Pain description: Achy, stiff,  discomfort Aggravating factors: stretching end ranges, sometimes during day.  Relieving factors: prescription meds, OTC meds.   PRECAUTIONS: Knee and Back  RED FLAGS: None   WEIGHT BEARING RESTRICTIONS: No  FALLS:  Has patient fallen in last 6 months? No  LIVING ENVIRONMENT: Lives with: lives with their family and lives with their son Lives in: House/apartment Stairs: Is doing stairs with the handrail and a cane Has following equipment at home: Single point cane and Environmental consultant - 2 wheeled  OCCUPATION: Retired PHD  PLOF: Independent  PATIENT GOALS: Be able to weed and garden, do yard work without knee or back restrictions  NEXT MD VISIT: 03/15/2024  OBJECTIVE:  Note: Objective measures were completed at Evaluation unless otherwise noted.  DIAGNOSTIC FINDINGS: Radiographs of the right knee show stable alignment of total knee  arthroplasty hardware.  There is no complicating feature.   PATIENT SURVEYS:  Patient-Specific Activity Scoring Scheme  0 represents "unable to perform." 10 represents "able to perform at prior level. 0 1 2 3 4 5 6 7 8 9  10 (Date and Score)   Activity Eval  02/17/2024  03/14/2024  1.  Sit on a low seat 5/10 8   2.  Clean the floor or garden 4/10  4.5  3.  Do laundry 4/10 8  4.  Take shower 6/10 9  5.  Work on laptop 7/10 10  Score 5.2  7.9   Total score = sum of the activity scores/number of activities Minimum detectable change (90%CI) for average score = 2 points Minimum detectable change (90%CI) for single activity score = 3 points     SENSATION: 02/17/2024 No complaints of peripheral pain or paresthesias  EDEMA:  02/17/2024 Noted and not objectively assessed   LOWER EXTREMITY ROM:  ROM Left/Right 02/17/2024 Right 02/22/24 Right  Rt 02/29/24 Right 03/02/2024 Right 03/07/24 Right 03/14/2024  Knee flexion 115/88 A: 104 P: 110 A: 98 AA: 108 Active 108 Active Seated 110* Seated heel slide AROM  105  Knee extension 0/-13 -9 (seated  LAQ)  AA: -12 Active -7 Seated  LAQ -7* Supine ball roll -5* Seated LAQ AROM -5  Ankle dorsiflexion         Ankle plantarflexion         Ankle inversion         Ankle eversion          (Blank rows = not tested)  LOWER EXTREMITY MMT:  MMT Right 02/17/2024 Left 02/17/2024 Right 03/14/2024  Hip flexion   5/5  Hip extension     Hip abduction     Hip adduction     Hip internal rotation     Hip external rotation     Knee flexion   5/5  Knee extension 2+ 4+ 4/5  Ankle dorsiflexion  Ankle plantarflexion     Ankle inversion     Ankle eversion      (Blank rows = not tested)  GAIT: 03/14/2024: Ambulation into clinic c SPC use.  Can perform household distances without cane per report.  Lacking TKE in stance for Rt leg.   Eval Distance walked: 100 feet Assistive device utilized: Walker - 2 wheeled Level of assistance: Complete Independence Comments: Benoit was assistive device free before surgery                    TREATMENT          DATE: 03/16/2024 Therex: Recumbent bike seat 9 lvl 3 8 mins  Incline gastroc stretch 30 sec x 3   Neuro Re-ed(balance, muscle activation) Tandem stance 1 min x 2 bilateral with occasional HHA on bars (cues for home use in corner for safety).  Alternating heel/toe lift 2 x 10   TherActivity: Leg press double leg 112 lbs x 15, Rt leg only 2 x 15 50 lbs  Blazepod bosu driving reaction reactive red/green 30 sec x 5                                                                                                                            Gait Training Education and verbal cues for importance of use of SPC to help offload surgery leg to improve gait to avoid antalgic gait changes.  TREATMENT          DATE: 03/14/2024 Therex: UBE LE only lvl 2.5 8 mins for ROM Seated quad set with SLR Rt leg 2 x 10  Incline gastroc stretch 30 sec x 3   Time spent in review of HEP updates from previous in clinic visits.  Encouraged continued consistent frequency.   Handout provided.    Neuro Re-ed(balance, muscle activation) Supine quad set review 5 sec hold x2 Seated quad set 5 sec hold x 10 Rt leg   TherActivity: Step up forward 4 inch step Rt WB with bilateral light hand assist on bar 2 x 10  Sit to stand to sit 18 inch chair s UE assist x 10  Blazepod bosu driving reaction reactive red/green 30 sec x 5     TREATMENT          DATE: 03/10/2024 Therapeutic Exercise: Scifit bike L4; 3 min fwd, 3 min bwd Seated self knee flexion 10x5 Seated knee flexion with PT overpressure x10 Seated hamstring curl red TB 2x10 Seated quad set foot on stool 2x10 Standing runner's stretch with terminal knee ext 2x30 Standing at counter hip abduction red TB 2x10  Manual Therapy: Grade III to IV mobs for knee ext Grade II to IV MWM for knee ext in standing   Gait Training: Amb with SPC x 200' focusing on improving trunk/hip ext during stance, reducing R LE ER Amb without a/d x200' focusing on maintaining normal reciprocal pattern; however, pt demos R lateral lean during stance  Self  Care: SpO2 checked throughout session due to concerns with pt's SHOB -- stable during amb at 98-100%; dropped to 94% with standing hip abd Discussed s/s of blood clots and to call MD if continued drop in spO2 at home   TREATMENT          DATE:03/07/2024 Therapeutic Exercise: recumbent bike seat 10 level 3 for 5 min.  Standing TKE green theraband 10 reps 1st set TKE only & 2nd set TKE + SLS tapping LLE to cone. Rail & cane support.  Gastroc incline BLEs 30 sec 3 reps. Heel raised on incline board 10 reps 2 sets Seated LAQ & active knee flexion with contralateral LE opposing motion 10 reps.   Supine heel slide with ball & strap knee flexion 5 sec hold & leg press / knee ext 5 sec hold 10 reps.    Therapeutic Activities: Leg press BLEs 112# 15 reps with PT cues for full motion;  RLE 56# 10 reps  Manual Therapy RLE knee ext grade IV mob with belt standing on incline  board Seated PROM Rt knee flexion with manual traction & tibial IR  Vaso Knee elevation with ext stretch medium compression 34* 10 min  PATIENT EDUCATION:  03/14/2024 Education details: HEP udate Person educated: Patient Education method: Programmer, multimedia, Facilities manager, Actor cues, Verbal cues, and Handouts Education comprehension: verbalized understanding, returned demonstration, verbal cues required, tactile cues required, and needs further education  HOME EXERCISE PROGRAM: Access Code: [email or web address] URL: [email or web address] Date: 03/14/2024 Prepared by: Ozell Silvan  Exercises - Supine Quadricep Sets  - 2-3 x daily - 7 x weekly - 1 sets - 10 reps - 5 second hold - Supine Heel Slide  - 2-3 x daily - 7 x weekly - 1-2 sets - 10 reps - 2 hold - Seated Knee Flexion AAROM  - 5 x daily - 7 x weekly - 1 sets - 10 reps - 10 seconds hold - Seated Passive Knee Extension  - 3 x daily - 7 x weekly - 1 sets - 1 reps - 3-5 hold - Seated Quad Set  - 2-3 x daily - 7 x weekly - 1 sets - 10 reps - 5 hold - Seated Straight Leg Heel Taps  - 1-2 x daily - 7 x weekly - 3 sets - 10 reps  ASSESSMENT:  CLINICAL IMPRESSION: Spent time in education on recommendation for continued SPC use to help avoid antalgic gait.  Lt foot clearing was impacted by stance on Rt leg.  Continued skilled PT services for strengthening and balance improvements indicated.    OBJECTIVE IMPAIRMENTS: Abnormal gait, decreased activity tolerance, decreased balance, decreased endurance, decreased knowledge of condition, difficulty walking, decreased ROM, decreased strength, increased edema, impaired perceived functional ability, and pain.   ACTIVITY LIMITATIONS: bending, standing, squatting, stairs, and locomotion level  PARTICIPATION LIMITATIONS: cleaning, laundry, driving, community activity, and yard work  PERSONAL FACTORS: Arthritis, emphysema, HLD, HTN, left TKA, former 40-year smoker are also affecting patient's  functional outcome.   REHAB POTENTIAL: Good  CLINICAL DECISION MAKING: Stable/uncomplicated  EVALUATION COMPLEXITY: Low   GOALS: Goals reviewed with patient? Yes  SHORT TERM GOALS: Target date: 03/16/2024 Taz will be independent with his day 1 home exercise program Baseline: Started 02/17/2024 Goal status: Met 03/02/2024  2.  Improve right knee active range of motion to 0 - 5 - 105 degrees Baseline: 0 - 13 - 88 degrees Goal status: Ongoing   03/07/2024  3.  Improve right quadriceps strength as assessed by MMT Baseline: 2+/5  Goal status: Ongoing   [id number]   LONG TERM GOALS: Target date: 04/25/2024  Improve patient specific functional score to at least 7.2 Baseline: 5.2 Goal status:   Ongoing   03/14/2024  2.  Samin will report right knee pain consistently 0-3/10 on the numeric pain rating scale Baseline: 1.5 - 6.5/10 Goal status: Ongoing   03/14/2024  3.  Improve right knee active range of motion to 0 - 3 - 110 degrees or better Baseline: 0 - 13 - 88 degrees Goal status: Ongoing   03/14/2024  4.  Improve right quadriceps strength as assessed by Tavaris's ability to walk without the use of an assistive device and send and descend stairs reciprocally with use of a single handrail Baseline: Unable at evaluation Goal status: Ongoing   03/14/2024  5.  Desmen will be independent and compliant with his long-term maintenance home exercise program at discharge Baseline: Started 02/17/2024 Goal status:  Ongoing   03/14/2024   PLAN:  PT FREQUENCY: 2 x/week  PT DURATION: 6 weeks  PLANNED INTERVENTIONS: 97750- Physical Performance Testing, 97110-Therapeutic exercises, 97530- Therapeutic activity, 97112- Neuromuscular re-education, 97535- Self Care, 02859- Manual therapy, [phone]- Gait training, [phone]- Vasopneumatic device, Patient/Family education, Balance training, Stair training, Joint mobilization, and Cryotherapy  PLAN FOR NEXT SESSION:     Strength and balance improvements.    Ok  to review a few back/hip stretching /strengthening to help improve ambulation stability and manage back symptoms as well.    Ozell Silvan, PT, DPT, OCS, ATC 03/16/24  10:52 AM   Asking for 12 more visits with start date of 03/15/2024 to 04/25/2024  Referring diagnosis? S03.347 Treatment diagnosis? (if different than referring diagnosis) R26.2, R60.0, M62.81, M25.661, M25.561 What was this (referring dx) caused by? [x]  Surgery []  Fall []  Ongoing issue [x]  Arthritis []  Other: ____________   Laterality: [x]  Rt []  Lt []  Both   Check all possible CPT codes:                   *CHOOSE 10 OR LESS*                                See Planned Interventions listed in the Plan section of the Evaluation

## 2024-03-16 NOTE — Addendum Note (Signed)
 Addended by: BRIEN SHARPER B on: 03/16/2024 10:39 AM   Modules accepted: Orders

## 2024-03-16 NOTE — Progress Notes (Addendum)
 Subjective:  Patient called today by [facility] pharmacy team for Ofev new start counseling. He is accompanied by his son Lynwood.  Patient was last seen by Dr. Kara on 03/09/2024. Pertinent past medical history includes OSA, Centrilobular emphysema, ILD, HTN, seasonal allergies and GERD.  Recent knee replacement surgery - taking acetaminophen  and off of opioids to manage pain. Denies any other joint pains/stiffness. No CAD, no history of cardiac events. GERD is well-controlled.  Patient does spend a significant amount of time gardening, can be up to several hours some days. Does not wear sunscreen or UV protective clothing.  History of CAD: No History of MI: No Current anticoagulant use: No History of HTN: Yes  History of elevated LFTs: No History of diarrhea, nausea, vomiting: No  Objective: Allergies  Allergen Reactions   Sulfa Antibiotics Rash   Outpatient Encounter Medications as of 01/20/2024  Medication Sig Note   Ascorbic Acid (VITAMIN C PO) Take 1 tablet by mouth daily.    atorvastatin (LIPITOR) 10 MG tablet Take 5 mg by mouth daily at 6 PM.  01/08/2022: Last dose 01/02/22   Azelaic Acid 15 % gel Apply 1 application. topically daily.    Calcium  Carb-Cholecalciferol (CALCIUM  + D3 PO) Take 1 tablet by mouth daily.    metroNIDAZOLE (METROGEL) 0.75 % gel Apply 1 application. topically daily. 11/13/2015: Received from: External Pharmacy   Misc Natural Products (GLUCOSAMINE-CHONDROITIN PLUS PO) Take 1 tablet by mouth daily.    Multiple Vitamin (MULTIVITAMIN) tablet Take 1 tablet by mouth daily.    Omega-3 Fatty Acids (FISH OIL PO) Take 1 capsule by mouth daily.    pantoprazole  (PROTONIX ) 40 MG tablet Take 40 mg by mouth daily.    Probiotic Product (PROBIOTIC PO) Take 1 capsule by mouth daily.    Propylene Glycol (SYSTANE COMPLETE OP) Apply 1 drop to eye daily as needed (dry eyes).    sodium chloride  (OCEAN) 0.65 % SOLN nasal spray Place 1 spray into both nostrils 2 (two)  times daily as needed for congestion.    traMADol (ULTRAM) 50 MG tablet Take 1 tablet (50 mg total) by mouth 2 (two) times daily.    No facility-administered encounter medications on file as of 01/20/2024.    Immunization History  Administered Date(s) Administered   Fluad Trivalent(High Dose 65+) 06/16/2023   Hepatitis A 09/27/2008, 11/12/2009   PFIZER(Purple Top)SARS-COV-2 Vaccination 09/10/2019, 10/01/2019   Pfizer(Comirnaty)Fall Seasonal Vaccine 12 years and older 06/06/2023   Pneumococcal Conjugate-13 07/10/2002   Td 11/01/2008, 09/09/2012   Typhoid Live 10/30/2008   Yellow Fever 10/06/2008   Zoster, Live 06/12/2008    PFT's TLC  Date Value Ref Range Status  11/12/2023 3.56 L Final    CMP     Component Value Date/Time   NA 139 01/10/2022 0151   K 4.1 01/10/2022 0151   CL 108 01/10/2022 0151   CO2 24 01/10/2022 0151   GLUCOSE 88 01/10/2022 0151   BUN 11 01/10/2022 0151   CREATININE 1.07 (H) 01/10/2022 0151   CALCIUM  8.5 (L) 01/10/2022 0151   PROT 6.6 01/08/2022 0411   ALBUMIN 3.3 (L) 01/08/2022 0411   AST 22 01/08/2022 0411   ALT 17 01/08/2022 0411   ALKPHOS 55 01/08/2022 0411   BILITOT 1.3 (H) 01/08/2022 0411   GFRNONAA 52 (L) 01/10/2022 0151   GFRAA  11/05/2009 1520    >60        The eGFR has been calculated using the MDRD equation. This calculation has not been validated in all  clinical situations. eGFR's persistently <60 mL/min signify possible Chronic Kidney Disease.    CBC    Component Value Date/Time   WBC 6.8 01/11/2022 0351   RBC 3.42 (L) 01/11/2022 0351   HGB 10.8 (L) 01/11/2022 0351   HCT 31.7 (L) 01/11/2022 0351   PLT 201 01/11/2022 0351   MCV 92.7 01/11/2022 0351   MCH 31.6 01/11/2022 0351   MCHC 34.1 01/11/2022 0351   RDW 12.3 01/11/2022 0351   LYMPHSABS 2.5 01/10/2022 0151   MONOABS 0.9 01/10/2022 0151   EOSABS 0.3 01/10/2022 0151   BASOSABS 0.1 01/10/2022 0151    LFT's    Latest Ref Rng & Units 01/08/2022    4:11 AM 01/06/2022    12:59 PM 11/05/2009    3:20 PM  Hepatic Function  Total Protein 6.5 - 8.1 g/dL 6.6  6.6  6.3   Albumin 3.5 - 5.0 g/dL 3.3  3.3  3.7   AST 15 - 41 U/L 22  25  27    ALT 0 - 44 U/L 17  19  22    Alk Phosphatase 38 - 126 U/L 55  55  62   Total Bilirubin 0.3 - 1.2 mg/dL 1.3  0.8  0.5     HRCT Chest 12/18/2023 - (Mild basilar fibrotic interstitial lung disease, 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 Findings are categorized as probable UIP per consensus guidelines)  Assessment and Plan  Discussed with Dr. 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 Recommend rheumatology referral to exclude autoimmune condition  Ofev and pirfenidone Medication Management Thoroughly counseled patient on the efficacy, mechanism of action, dosing, administration, adverse effects, and monitoring parameters of Ofev. Patient verbalized understanding.   Goals of Therapy: Will not stop or reverse the progression of ILD. It will slow the progression of ILD.  Ofev: Inhibits tyrosine kinase inhibitors which slow the fibrosis/progression of ILD   Ofev: Dosing: 150 mg (one capsule) by mouth twice daily (approx 12 hours apart). Discussed taking with food approximately 12 hours apart. Discussed that capsule should not be crushed or split.   Adverse Effects: Nausea, vomiting, diarrhea (2 in 3 patients) appetite loss, weight loss - management of diarrhea with loperamide discussed including max use of 48 hours and max of 8 capsules per day. Abdominal pain (up to 1 in 5 patients) Nasopharyngitis (13%), UTI (6%) Risk of thrombosis (3%) and acute MI (2%) Hypertension (5%) Dizziness Fatigue (10%)   Pirfenidone: Dosing: Starting dose will be pirfenidone 267 mg 1 tablet three times daily for 7 days, then 2 tablets three times daily for 7 days, then 3 tablets three times daily.  Maintenance dose will be 801 mg 1 tablet three times daily if tolerated.  Stressed the importance of taking with meals and  space at least 5-6 hours apart to minimize stomach upset.    Adverse Effects: Nausea, vomiting, diarrhea, weight loss Abdominal pain GERD Sun sensitivity/rash - patient advised to wear sunscreen when exposed to sunlight Dizziness Fatigue   Both Ofev and pirfenidone: Monitoring: Monitor for diarrhea, nausea and vomiting, hepatotoxicity  Monitor LFTs - baseline, monthly for first 6 months, then every 3 months routinely CBC w differential at baseline and every 3 months routinely   Medication Reconciliation A drug regimen assessment was performed, including review of allergies, interactions, disease-state management, dosing and immunization history. Medications were reviewed with the patient, including name, instructions, indication, goals of therapy, potential side effects, importance of adherence, and safe use.  Anticoagulant use:  No  PLAN After detailed discussion of Ofev vs pirfenidone, patient preferred to trial OFEV. He spends significant time outdoors gardening and would prefer to avoid sunburn risk associated with pirfenidone LFTs drawn today -  Patient does not qualify for patient assistance. He has former [employer] so copay will be $100 per month (most likely) Dose will be Ofev 150mg  twice daily once approved and if LFTs and renal function wnl Referral to rheumatology placed for +ANA high-titer with nucleolar pattern  This appointment required 60 minutes of patient care (this includes precharting, chart review, review of results, face-to-face care, etc.).  Thank you for involving pharmacy to assist in providing this patient's care.    Sherry Pennant, PharmD, MPH, BCPS, CPP Clinical Pharmacist (Rheumatology and Pulmonology)

## 2024-03-17 ENCOUNTER — Ambulatory Visit: Payer: Self-pay | Admitting: Pharmacist

## 2024-03-17 ENCOUNTER — Encounter: Payer: Self-pay | Admitting: Pulmonary Disease

## 2024-03-17 ENCOUNTER — Encounter: Payer: Self-pay | Admitting: Emergency Medicine

## 2024-03-17 ENCOUNTER — Encounter: Payer: Self-pay | Admitting: Family

## 2024-03-17 ENCOUNTER — Telehealth: Payer: Self-pay

## 2024-03-17 ENCOUNTER — Ambulatory Visit

## 2024-03-17 VITALS — Ht 71.0 in | Wt 235.0 lb

## 2024-03-17 DIAGNOSIS — J8410 Pulmonary fibrosis, unspecified: Secondary | ICD-10-CM

## 2024-03-17 DIAGNOSIS — Z0000 Encounter for general adult medical examination without abnormal findings: Secondary | ICD-10-CM | POA: Diagnosis not present

## 2024-03-17 DIAGNOSIS — Z5181 Encounter for therapeutic drug level monitoring: Secondary | ICD-10-CM

## 2024-03-17 DIAGNOSIS — J849 Interstitial pulmonary disease, unspecified: Secondary | ICD-10-CM

## 2024-03-17 NOTE — Telephone Encounter (Addendum)
 Submitted a Prior Authorization request to [employer] for OFEV via CoverMyMeds. Will update once we receive a response.  Key: [id number]  Sherry Pennant, PharmD, MPH, BCPS, CPP Clinical Pharmacist (Rheumatology and Pulmonology)     ----- Message from Sherry GORMAN Pennant sent at 03/16/2024  3:26 PM EDT ----- Patient will be Ofev new start for ILD with progressive fibrotic phenotype. Dose : 150mg  twice daily He does not qualify for PAP. Has [facility] Medicare plan

## 2024-03-17 NOTE — Progress Notes (Signed)
 Subjective:   Gregg Smith is a 78 y.o. who presents for a Medicare Wellness preventive visit.  As a reminder, Annual Wellness Visits don't include a physical exam, and some assessments may be limited, especially if this visit is performed virtually. We may recommend an in-person follow-up visit with your provider if needed.  Visit Complete: Virtual I connected with  Clem JAYSON Nones on 03/17/24 by a audio enabled telemedicine application and verified that I am speaking with the correct person using two identifiers.  Patient Location: Home  Provider Location: Office/Clinic  I discussed the limitations of evaluation and management by telemedicine. The patient expressed understanding and agreed to proceed.  Vital Signs: Because this visit was a virtual/telehealth visit, some criteria may be missing or patient reported. Any vitals not documented were not able to be obtained and vitals that have been documented are patient reported.  VideoDeclined- This patient declined Librarian, academic. Therefore the visit was completed with audio only.  Persons Participating in Visit: Patient.  AWV Questionnaire: Yes: Patient Medicare AWV questionnaire was completed by the patient on 03/16/2024; I have confirmed that all information answered by patient is correct and no changes since this date.  Cardiac Risk Factors include: advanced age (>92men, >50 women);dyslipidemia;hypertension;male gender     Objective:    Today's Vitals   03/17/24 1133  Weight: 235 lb (106.6 kg)  Height: 5' 11 (1.803 m)   Body mass index is 32.78 kg/m.     02/17/2024    4:38 PM 02/03/2024    7:09 AM 01/21/2024    8:56 AM 04/09/2023    8:24 AM 04/11/2021    2:55 PM 04/11/2019    5:55 PM 03/18/2019    8:12 AM  Advanced Directives  Does Patient Have a Medical Advance Directive? No No No No No  No  Would patient like information on creating a medical advance directive? No - Patient  declined No - Patient declined Yes (MAU/Ambulatory/Procedural Areas - Information given) No - Patient declined No - Patient declined       Information is confidential and restricted. Go to Review Flowsheets to unlock data.    Current Medications (verified) Outpatient Encounter Medications as of 03/17/2024  Medication Sig   acetaminophen  (TYLENOL ) 500 MG tablet Take 500 mg by mouth every 6 (six) hours as needed for moderate pain (pain score 4-6) (prn for pain).   albuterol  (VENTOLIN  HFA) 108 (90 Base) MCG/ACT inhaler Inhale 2 puffs into the lungs every 6 (six) hours as needed for wheezing or shortness of breath.   eszopiclone (LUNESTA) 2 MG TABS tablet Take 1 mg by mouth at bedtime.   Fluticasone -Umeclidin-Vilant (TRELEGY ELLIPTA ) 100-62.5-25 MCG/ACT AEPB Inhale 1 puff into the lungs daily.   gabapentin  (NEURONTIN ) 300 MG capsule Take 1 capsule (300 mg total) by mouth 2 (two) times daily.   GEMTESA  75 MG TABS TAKE 75 MG BY MOUTH DAILY.   L-Theanine 200 MG CAPS Take 200 mg by mouth daily.   lansoprazole  (PREVACID ) 30 MG capsule TAKE 1 CAPSULE (30 MG TOTAL) BY MOUTH 2 (TWO) TIMES DAILY BEFORE A MEAL.   Milk Thistle 1000 MG CAPS Take 1,000 mg by mouth daily.   Multiple Vitamin (MULTIVITAMIN) tablet Take 1 tablet by mouth daily.   oxyCODONE -acetaminophen  (PERCOCET/ROXICET) 5-325 MG tablet Take 1 tablet by mouth every 6 (six) hours as needed.   rosuvastatin  (CRESTOR ) 10 MG tablet TAKE 1 TABLET BY MOUTH EVERY DAY   tamsulosin  (FLOMAX ) 0.4 MG CAPS capsule TAKE  1 CAPSULE BY MOUTH DAILY AT NIGHT.   valsartan -hydrochlorothiazide  (DIOVAN -HCT) 80-12.5 MG tablet TAKE 1 TABLET BY MOUTH EVERY DAY   OVER THE COUNTER MEDICATION Take 1 tablet by mouth daily. Calm Aid (Patient not taking: Reported on 03/17/2024)   Facility-Administered Encounter Medications as of 03/17/2024  Medication   0.9 %  sodium chloride  infusion    Allergies (verified) Penicillins   History: Past Medical History:  Diagnosis Date    Allergy    seasonal    Anxiety    Arthritis    Barrett's esophagus    Cataract    Chicken pox    Colon polyps    hyperplastic   Diverticulosis    Emphysema lung (HCC)    GERD (gastroesophageal reflux disease)    Hemorrhoids    Hyperlipidemia    Hypertension    ILD (interstitial lung disease) (HCC)    12/18/23 Mild basilar fibrotic interstitial lung disease, 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   Procedure: LEFT TOTAL KNEE ARTHROPLASTY;  Surgeon: Donnice Car, MD;  Location: [facility];  Service: Orthopedics;  Laterality: Left;   TOTAL KNEE ARTHROPLASTY Right 02/03/2024   Procedure: ARTHROPLASTY, KNEE, TOTAL;  Surgeon: Harden Jerona GAILS, MD;  Location: [facility];  Service: Orthopedics;  Laterality: Right;   UPPER GASTROINTESTINAL ENDOSCOPY     Family History  Problem Relation Age of Onset   Alzheimer's disease Mother    Cancer Father    Stomach cancer Father    Aortic aneurysm Son    Heart disease Son    Cancer Maternal Grandmother    Heart disease Maternal Grandfather    Stomach cancer Paternal Grandfather    Colon cancer Neg Hx    Colon polyps Neg Hx    Esophageal cancer Neg Hx    Rectal cancer Neg Hx    Pancreatic cancer Neg Hx    Liver cancer Neg Hx    Social History   Socioeconomic History   Marital status: Widowed    Spouse name: [name]   Number of children: 1   Years of education: PHD   Highest education level: Doctorate  Occupational History   Occupation: Retired    Comment: Loss adjuster, chartered: [employer]  Tobacco Use   Smoking status: Former    Current  packs/day: 0.00    Average packs/day: 1 pack/day for 40.0 years (40.0 ttl pk-yrs)    Types: Cigarettes    Start date: 01/19/1967    Quit date: 01/19/2007    Years since quitting: 17.1   Smokeless tobacco: Never  Vaping Use   Vaping status: Never Used  Substance and Sexual Activity   Alcohol use: Yes    Alcohol/week: 3.0 standard drinks of alcohol    Types: 3 Cans of beer per week   Drug use: No   Sexual activity: Not Currently  Other Topics Concern   Not on file  Social History Narrative   Patient lives alone in his  home   Patient is retired   Patient has a Ph.D   Patient has one adult child.   Patient is right-handed.   Patient drinks 1 cup of tea and 1 cup of coffee daily    Social Drivers of Health   Financial Resource Strain: Low Risk  (03/17/2024)   Overall Financial Resource Strain (CARDIA)    Difficulty of Paying Living Expenses: Not hard at all  Food Insecurity: No Food Insecurity (03/17/2024)   Hunger Vital Sign    Worried About Running Out of Food in the Last Year: Never true    Ran Out of Food in the Last Year: Never true  Transportation Needs: No Transportation Needs (03/17/2024)   PRAPARE - Administrator, Civil Service (Medical): No    Lack of Transportation (Non-Medical): No  Physical Activity: Sufficiently Active (03/17/2024)   Exercise Vital Sign    Days of Exercise per Week: 3 days    Minutes of Exercise per Session: 60 min  Stress: No Stress Concern Present (03/17/2024)   [employer] of Occupational Health - Occupational Stress Questionnaire    Feeling of Stress: Only a little  Social Connections: Moderately Integrated (03/17/2024)   Social Connection and Isolation Panel    Frequency of Communication with Friends and Family: More than three times a week    Frequency of Social Gatherings with Friends and Family: Three times a week    Attends Religious Services: More than 4 times per year    Active Member of Clubs or Organizations: Yes     Attends Banker Meetings: More than 4 times per year    Marital Status: Widowed    Tobacco Counseling Counseling given: No    Clinical Intake:  Pre-visit preparation completed: Yes  Pain : No/denies pain     BMI - recorded: 32.78 Nutritional Status: BMI > 30  Obese Nutritional Risks: None Diabetes: No  Lab Results  Component Value Date   HGBA1C 5.5 03/18/2023   HGBA1C 5.5 09/17/2022   HGBA1C 5.3 04/11/2019     How often do you need to have someone help you when you read instructions, pamphlets, or other written materials from your doctor or pharmacy?: 1 - Never  Interpreter Needed?: No  Information entered by :: Verdie Saba, CMA   Activities of Daily Living     03/17/2024   11:40 AM 03/16/2024    4:48 PM  In your present state of health, do you have any difficulty performing the following activities:  Hearing? 0 0  Vision? 0 0  Difficulty concentrating or making decisions? 0 0  Walking or climbing stairs? 0 0  Dressing or bathing? 0 0  Doing errands, shopping? 0 0  Preparing Food and eating ? N N  Using the Toilet? N N  In the past six months, have you accidently leaked urine? N   Do you have problems with loss of bowel control? N N  Managing your Medications? N N  Managing your Finances? N N  Housekeeping or managing your Housekeeping? N N    Patient Care Team: Purcell Emil Schanz, MD as PCP - General (Internal Medicine) Patrcia Sharper, MD as Consulting Physician (Ophthalmology)  I have updated your Care Teams any recent Medical Services you may have received from other providers in the past year.     Assessment:   This is a routine wellness examination for Zaccary.  Hearing/Vision screen Hearing Screening - Comments:: Denies hearing difficulties   Vision Screening -  Comments:: Wears rx glasses - up to date with routine eye exams with Dr Patrcia   Goals Addressed               This Visit's Progress     Patient Stated  (pt-stated)        Patient stated he just had a rt knee replacement and is planning to heal and continue PT       Depression Screen     03/17/2024   11:42 AM 03/14/2024    2:31 PM 11/03/2023    2:40 PM 09/21/2023    9:56 AM 06/04/2023    1:41 PM 04/09/2023    8:26 AM 03/18/2023    9:26 AM  PHQ 2/9 Scores  PHQ - 2 Score 0 0 0 0 0 0 0  PHQ- 9 Score 3     4     Fall Risk     03/17/2024   11:41 AM 03/16/2024    4:48 PM 03/14/2024    2:31 PM 12/07/2023    9:23 AM 11/03/2023    2:40 PM  Fall Risk   Falls in the past year? 0 0 0 0 0  Number falls in past yr: 0 0 0  0  Injury with Fall? 0 0 0  0  Risk for fall due to : No Fall Risks  No Fall Risks  No Fall Risks  Follow up Falls evaluation completed;Falls prevention discussed  Falls evaluation completed  Falls evaluation completed    MEDICARE RISK AT HOME:  Medicare Risk at Home Any stairs in or around the home?: Yes If so, are there any without handrails?: No Home free of loose throw rugs in walkways, pet beds, electrical cords, etc?: No Adequate lighting in your home to reduce risk of falls?: Yes Life alert?: No Use of a cane, walker or w/c?: Yes (cane) Grab bars in the bathroom?: No Shower chair or bench in shower?: No Elevated toilet seat or a handicapped toilet?: No  TIMED UP AND GO:  Was the test performed?  No  Cognitive Function: 6CIT completed        03/17/2024   11:56 AM 04/09/2023    8:24 AM  6CIT Screen  What Year? 0 points 0 points  What month? 0 points 0 points  What time? 0 points 0 points  Count back from 20 0 points 0 points  Months in reverse 0 points 0 points  Repeat phrase 0 points 0 points  Total Score 0 points 0 points    Immunizations Immunization History  Administered Date(s) Administered   Fluad Quad(high Dose 65+) 05/31/2020, 05/01/2023   Influenza, High Dose Seasonal PF 05/15/2017, 05/13/2018, 04/19/2019   Influenza-Unspecified 06/01/2014, 07/07/2015, 06/02/2016, 04/19/2019, 05/18/2021    PFIZER(Purple Top)SARS-COV-2 Vaccination 10/14/2019, 11/08/2019, 05/26/2020, 12/09/2020, 05/19/2021   Pneumococcal Conjugate-13 10/06/2014   Pneumococcal Polysaccharide-23 06/18/2007, 08/03/2018   Tdap 04/14/2012, 08/14/2012   Unspecified SARS-COV-2 Vaccination 05/08/2023, 12/23/2023   Zoster Recombinant(Shingrix) 03/05/2021, 05/08/2021   Zoster, Live 08/17/2008    Screening Tests Health Maintenance  Topic Date Due   INFLUENZA VACCINE  04/01/2024   Lung Cancer Screening  12/17/2024   Medicare Annual Wellness (AWV)  03/17/2025   Pneumococcal Vaccine: 50+ Years  Completed   COVID-19 Vaccine  Completed   Hepatitis C Screening  Completed   Zoster Vaccines- Shingrix  Completed   Hepatitis B Vaccines  Aged Out   HPV VACCINES  Aged Out   Meningococcal B Vaccine  Aged Out   DTaP/Tdap/Td  Discontinued  Colonoscopy  Discontinued    Health Maintenance  There are no preventive care reminders to display for this patient. Health Maintenance Items Addressed:03/17/2024  Additional Screening:  Vision Screening: Recommended annual ophthalmology exams for early detection of glaucoma and other disorders of the eye. Would you like a referral to an eye doctor? No    Dental Screening: Recommended annual dental exams for proper oral hygiene  Community Resource Referral / Chronic Care Management: CRR required this visit?  No   CCM required this visit?  No   Plan:    I have personally reviewed and noted the following in the patient's chart:   Medical and social history Use of alcohol, tobacco or illicit drugs  Current medications and supplements including opioid prescriptions. Patient is currently taking opioid prescriptions. Information provided to patient regarding non-opioid alternatives. Patient advised to discuss non-opioid treatment plan with their provider. Functional ability and status Nutritional status Physical activity Advanced directives List of other  physicians Hospitalizations, surgeries, and ER visits in previous 12 months Vitals Screenings to include cognitive, depression, and falls Referrals and appointments  In addition, I have reviewed and discussed with patient certain preventive protocols, quality metrics, and best practice recommendations. A written personalized care plan for preventive services as well as general preventive health recommendations were provided to patient.   Verdie CHRISTELLA Saba, CMA   03/17/2024   After Visit Summary: (MyChart) Due to this being a telephonic visit, the after visit summary with patients personalized plan was offered to patient via MyChart   Notes: Nothing significant to report at this time.

## 2024-03-17 NOTE — Patient Instructions (Signed)
 Mr. Gregg Smith , Thank you for taking time out of your busy schedule to complete your Annual Wellness Visit with me. I enjoyed our conversation and look forward to speaking with you again next year. I, as well as your care team,  appreciate your ongoing commitment to your health goals. Please review the following plan we discussed and let me know if I can assist you in the future. Your Game plan/ To Do List   Follow up Visits: Next Medicare AWV with our clinical staff: 03/23/2025   Have you seen your provider in the last 6 months (3 months if uncontrolled diabetes)? Yes Next Office Visit with your provider: 09/14/2024  Clinician Recommendations:  Aim for 30 minutes of exercise or brisk walking, 6-8 glasses of water , and 5 servings of fruits and vegetables each day.       This is a list of the screening recommended for you and due dates:  Health Maintenance  Topic Date Due   Flu Shot  04/01/2024   Screening for Lung Cancer  12/17/2024   Medicare Annual Wellness Visit  03/17/2025   Pneumococcal Vaccine for age over 18  Completed   COVID-19 Vaccine  Completed   Hepatitis C Screening  Completed   Zoster (Shingles) Vaccine  Completed   Hepatitis B Vaccine  Aged Out   HPV Vaccine  Aged Out   Meningitis B Vaccine  Aged Out   DTaP/Tdap/Td vaccine  Discontinued   Colon Cancer Screening  Discontinued    Advanced directives: (Copy Requested) Please bring a copy of your health care power of attorney and living will to the office to be added to your chart at your convenience. You can mail to [facility] [address] [place], [place] [address] or email to [email or web address] Advance Care Planning is important because it:  [x]  Makes sure you receive the medical care that is consistent with your values, goals, and preferences  [x]  It provides guidance to your family and loved ones and reduces their decisional burden about whether or not they are making the right decisions  based on your wishes.  Follow the link provided in your after visit summary or read over the paperwork we have mailed to you to help you started getting your Advance Directives in place. If you need assistance in completing these, please reach out to us  so that we can help you!

## 2024-03-18 ENCOUNTER — Ambulatory Visit: Admitting: Physical Therapy

## 2024-03-18 ENCOUNTER — Telehealth: Payer: Self-pay

## 2024-03-18 ENCOUNTER — Encounter: Payer: Self-pay | Admitting: Physical Therapy

## 2024-03-18 DIAGNOSIS — R600 Localized edema: Secondary | ICD-10-CM | POA: Diagnosis not present

## 2024-03-18 DIAGNOSIS — M6281 Muscle weakness (generalized): Secondary | ICD-10-CM | POA: Diagnosis not present

## 2024-03-18 DIAGNOSIS — R262 Difficulty in walking, not elsewhere classified: Secondary | ICD-10-CM | POA: Diagnosis not present

## 2024-03-18 DIAGNOSIS — M25661 Stiffness of right knee, not elsewhere classified: Secondary | ICD-10-CM | POA: Diagnosis not present

## 2024-03-18 DIAGNOSIS — M25561 Pain in right knee: Secondary | ICD-10-CM

## 2024-03-18 NOTE — Therapy (Signed)
 OUTPATIENT PHYSICAL THERAPY TREATMENT   Patient Name: Gregg Smith MRN: [id number] DOB:1946/07/20, 78 y.o., male Today's Date: 03/21/2024   END OF SESSION:  PT End of Session - 03/21/24 1146     Visit Number 13    Number of Visits 22    Date for PT Re-Evaluation 04/25/24    Authorization Type HU[employer]    Authorization Time Period 03/15/2024 - 04/25/2024    Progress Note Due on Visit 20    PT Start Time 1146    PT Stop Time 1235    PT Time Calculation (min) 49 min    Activity Tolerance Patient tolerated treatment well;No increased pain    Behavior During Therapy WFL for tasks assessed/performed               Past Medical History:  Diagnosis Date   Allergy    seasonal    Anxiety    Arthritis    Barrett's esophagus    Cataract    Chicken pox    Colon polyps    hyperplastic   Diverticulosis    Emphysema lung (HCC)    GERD (gastroesophageal reflux disease)    Hemorrhoids    Hyperlipidemia    Hypertension    ILD (interstitial lung disease) (HCC)    12/18/23 Mild basilar fibrotic interstitial lung disease, 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   Procedure: LEFT TOTAL KNEE ARTHROPLASTY;  Surgeon: Donnice Car, MD;  Location: [facility];  Service: Orthopedics;  Laterality: Left;   TOTAL KNEE ARTHROPLASTY Right 02/03/2024   Procedure: ARTHROPLASTY, KNEE, TOTAL;  Surgeon: Harden Jerona GAILS, MD;  Location: [facility] OR;  Service: Orthopedics;  Laterality: Right;   UPPER GASTROINTESTINAL ENDOSCOPY     Patient Active Problem List   Diagnosis Date Noted   Osteoarthritis (arthritis due to wear and tear  of joints) 02/03/2024   Left leg weakness 11/03/2023   Osteoarthritis of lumbar spine with myelopathy 11/03/2023   Chronic pain of right knee 09/21/2023   Centrilobular emphysema (HCC) 06/04/2023   Interstitial lung disease (HCC) 06/04/2023   Chronic cough 02/03/2023   Overactive bladder 09/17/2022   Aortic atherosclerosis (HCC) 03/17/2022   Acute insomnia 10/23/2021   Chronic left-sided low back pain with left-sided sciatica 03/05/2021   Unilateral primary osteoarthritis, right knee 01/31/2021   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   Scoliosis deformity of spine 04/22/2019   Generalized anxiety disorder    Panic disorder    Insomnia    Spinal stenosis of lumbar region 11/30/2018   GERD (gastroesophageal reflux disease) 08/14/2017   S/P left TKA 08/11/2016   S/P knee replacement 08/11/2016   OSA (obstructive sleep apnea) 03/22/2014   Snoring 05/09/2013   Essential hypertension 02/11/2013   Dyslipidemia 02/11/2013   Arthritis 02/11/2013    PCP: Emil Aloysius Schaumann, MD  REFERRING PROVIDER: Harden Jerona GAILS, MD   REFERRING DIAG:  M17.11 (ICD-10-CM) - Primary osteoarthritis of right knee  M17.11 (ICD-10-CM) - Unilateral primary osteoarthritis, right knee  Z96.651 (  ICD-10-CM) - Status post right knee replacement    THERAPY DIAG:  Localized edema  Difficulty in walking, not elsewhere classified  Stiffness of right knee, not elsewhere classified  Muscle weakness (generalized)  Acute pain of right knee  Rationale for Evaluation and Treatment: Rehabilitation  ONSET DATE: February 03, 2024  SUBJECTIVE:   SUBJECTIVE STATEMENT: Patient states that he has been able to do his HS stretch at home, but needs a refresh on how to appropriately do the nerve flossing. Reports 0/10 pain this morning.    PERTINENT HISTORY: Arthritis, sciatica, emphysema, HLD, HTN, left TKA (~6 years ago), former 40-year smoker  PAIN:  NPRS scale: R knee 0-1/10, L sciatica pain  around 3/10  Pain location: Rt knee and  L sciatica in R LE too  Pain description: Achy, stiff, discomfort, sciatica pains  Aggravating factors: stretching end ranges, sometimes during day. For sciatica sitting and slouching makes it worse  Relieving factors: prescription meds, OTC meds, for sciatica walking makes it better  PRECAUTIONS: Knee and Back  RED FLAGS: None   WEIGHT BEARING RESTRICTIONS: No  FALLS:  Has patient fallen in last 6 months? No  LIVING ENVIRONMENT: Lives with: lives with their family and lives with their son Lives in: House/apartment Stairs: Is doing stairs with the handrail and a cane Has following equipment at home: Single point cane and Environmental consultant - 2 wheeled  OCCUPATION: Retired PHD  PLOF: Independent  PATIENT GOALS: Be able to weed and garden, do yard work without knee or back restrictions  NEXT MD VISIT: 03/15/2024  OBJECTIVE:  Note: Objective measures were completed at Evaluation unless otherwise noted.  DIAGNOSTIC FINDINGS: Radiographs of the right knee show stable alignment of total knee  arthroplasty hardware.  There is no complicating feature.   PATIENT SURVEYS:  Patient-Specific Activity Scoring Scheme  0 represents "unable to perform." 10 represents "able to perform at prior level. 0 1 2 3 4 5 6 7 8 9  10 (Date and Score)   Activity Eval  02/17/2024  03/14/2024  1.  Sit on a low seat 5/10 8   2.  Clean the floor or garden 4/10  4.5  3.  Do laundry 4/10 8  4.  Take shower 6/10 9  5.  Work on laptop 7/10 10  Score 5.2  7.9   Total score = sum of the activity scores/number of activities Minimum detectable change (90%CI) for average score = 2 points Minimum detectable change (90%CI) for single activity score = 3 points     SENSATION: 02/17/2024 No complaints of peripheral pain or paresthesias  EDEMA:  02/17/2024 Noted and not objectively assessed   LOWER EXTREMITY ROM:  ROM Left/Right 02/17/2024 Right 02/22/24 Right   Rt 02/29/24 Right 03/02/2024 Right 03/07/24 Right 03/14/2024  Knee flexion 115/88 A: 104 P: 110 A: 98 AA: 108 Active 108 Active Seated 110* Seated heel slide AROM  105  Knee extension 0/-13 -9 (seated LAQ)  AA: -12 Active -7 Seated  LAQ -7* Supine ball roll -5* Seated LAQ AROM -5  Ankle dorsiflexion         Ankle plantarflexion         Ankle inversion         Ankle eversion          (Blank rows = not tested)  LOWER EXTREMITY MMT:  MMT Right 02/17/2024 Left 02/17/2024 Right 03/14/2024  Hip flexion   5/5  Hip extension     Hip abduction  Hip adduction     Hip internal rotation     Hip external rotation     Knee flexion   5/5  Knee extension 2+ 4+ 4/5  Ankle dorsiflexion     Ankle plantarflexion     Ankle inversion     Ankle eversion      (Blank rows = not tested)  GAIT: 03/14/2024: Ambulation into clinic c SPC use.  Can perform household distances without cane per report.  Lacking TKE in stance for Rt leg.   Eval Distance walked: 100 feet Assistive device utilized: Walker - 2 wheeled Level of assistance: Complete Independence Comments: [place] was assistive device free before surgery                    TREATMENT          DATE:  03/21/2024 TherEx: SciFit bike starting at seat 11 moving to seat 9 after 2 minutes, total of 8 minutes Seated hamstring stretch 2x30s each  Incline gastroc stretch 3x30s Leg press B LE with 2x15 with 125#; increased depth for second set  Leg press with R LE only 1x15 with 62#  Self-Care: Edema management, typical post-op course, what to expect when completing PT, reviewed seated sciatic nerve flossing technique   Vaso:  Supine with LEs on wedge; R LE with medium pressure at 34deg for 10 minutes   03/18/24  Scifit bike seat 9 L3 x8 minutes full rotations for w/u   HS stretch 3x30 seconds hook lying with strap  Tried multiple versions of sciatic nerve flossing- seated version most successful, added this to HEP. Able to reduce sciatic sx  by 50% today  Gait training with heavy cues for heel-toe pattern and to SLOW DOWN    03/16/2024 Therex: Recumbent bike seat 9 lvl 3 8 mins  Incline gastroc stretch 30 sec x 3   Neuro Re-ed(balance, muscle activation) Tandem stance 1 min x 2 bilateral with occasional HHA on bars (cues for home use in corner for safety).  Alternating heel/toe lift 2 x 10   TherActivity: Leg press double leg 112 lbs x 15, Rt leg only 2 x 15 50 lbs  Blazepod bosu driving reaction reactive red/green 30 sec x 5                                                                                                                            Gait Training Education and verbal cues for importance of use of SPC to help offload surgery leg to improve gait to avoid antalgic gait changes.  TREATMENT          DATE: 03/14/2024 Therex: UBE LE only lvl 2.5 8 mins for ROM Seated quad set with SLR Rt leg 2 x 10  Incline gastroc stretch 30 sec x 3   Time spent in review of HEP updates from previous in clinic visits.  Encouraged continued consistent frequency.  Handout provided.    Neuro Re-ed(balance, muscle  activation) Supine quad set review 5 sec hold x2 Seated quad set 5 sec hold x 10 Rt leg   TherActivity: Step up forward 4 inch step Rt WB with bilateral light hand assist on bar 2 x 10  Sit to stand to sit 18 inch chair s UE assist x 10  Blazepod bosu driving reaction reactive red/green 30 sec x 5     TREATMENT          DATE: 03/10/2024 Therapeutic Exercise: Scifit bike L4; 3 min fwd, 3 min bwd Seated self knee flexion 10x5 Seated knee flexion with PT overpressure x10 Seated hamstring curl red TB 2x10 Seated quad set foot on stool 2x10 Standing runner's stretch with terminal knee ext 2x30 Standing at counter hip abduction red TB 2x10  Manual Therapy: Grade III to IV mobs for knee ext Grade II to IV MWM for knee ext in standing   Gait Training: Amb with SPC x 200' focusing on improving trunk/hip ext  during stance, reducing R LE ER Amb without a/d x200' focusing on maintaining normal reciprocal pattern; however, pt demos R lateral lean during stance  Self Care: SpO2 checked throughout session due to concerns with pt's SHOB -- stable during amb at 98-100%; dropped to 94% with standing hip abd Discussed s/s of blood clots and to call MD if continued drop in spO2 at home    PATIENT EDUCATION:  03/14/2024 Education details: HEP udate Person educated: Patient Education method: Programmer, multimedia, Facilities manager, Actor cues, Verbal cues, and Handouts Education comprehension: verbalized understanding, returned demonstration, verbal cues required, tactile cues required, and needs further education  HOME EXERCISE PROGRAM:  Access Code: [email or web address] URL: [email or web address] Date: 03/18/2024 Prepared by: Josette Rough  Exercises - Supine Quadricep Sets  - 2-3 x daily - 7 x weekly - 1 sets - 10 reps - 5 second hold - Supine Heel Slide  - 2-3 x daily - 7 x weekly - 1-2 sets - 10 reps - 2 hold - Seated Knee Flexion AAROM  - 5 x daily - 7 x weekly - 1 sets - 10 reps - 10 seconds hold - Seated Passive Knee Extension  - 3 x daily - 7 x weekly - 1 sets - 1 reps - 3-5 hold - Seated Quad Set  - 2-3 x daily - 7 x weekly - 1 sets - 10 reps - 5 hold - Seated Straight Leg Heel Taps  - 1-2 x daily - 7 x weekly - 3 sets - 10 reps - Hooklying Hamstring Stretch with Strap  - 2 x daily - 7 x weekly - 1 sets - 4 reps - 30 seconds  hold - Seated Sciatic Nerve Glide With Cervical Motion  - 2 x daily - 7 x weekly - 1 sets - 20 reps  ASSESSMENT:  CLINICAL IMPRESSION:  Patient arrived to session on time today with no c/o increased pain. Patient noted intermittent deep pressure within the knee after hamstring and gastroc stretches that resolved with leg press exercises. Continued cues for appropriate gait mechanics in order to improve heel strike on R LE. Patient also spending increased time perseverating  on edema management in the R knee. Patient will continue to benefit from skilled PT to address noted deficits.    OBJECTIVE IMPAIRMENTS: Abnormal gait, decreased activity tolerance, decreased balance, decreased endurance, decreased knowledge of condition, difficulty walking, decreased ROM, decreased strength, increased edema, impaired perceived functional ability, and pain.   ACTIVITY LIMITATIONS: bending, standing, squatting, stairs, and locomotion level  PARTICIPATION  LIMITATIONS: cleaning, laundry, driving, community activity, and yard work  PERSONAL FACTORS: Arthritis, emphysema, HLD, HTN, left TKA, former 40-year smoker are also affecting patient's functional outcome.   REHAB POTENTIAL: Good  CLINICAL DECISION MAKING: Stable/uncomplicated  EVALUATION COMPLEXITY: Low   GOALS: Goals reviewed with patient? Yes  SHORT TERM GOALS: Target date: 03/16/2024 Kiron will be independent with his day 1 home exercise program Baseline: Started 02/17/2024 Goal status: Met 03/02/2024  2.  Improve right knee active range of motion to 0 - 5 - 105 degrees Baseline: 0 - 13 - 88 degrees Goal status: Ongoing   03/07/2024  3.  Improve right quadriceps strength as assessed by MMT Baseline: 2+/5 Goal status: Ongoing   [id number]   LONG TERM GOALS: Target date: 04/25/2024  Improve patient specific functional score to at least 7.2 Baseline: 5.2 Goal status:   Ongoing   03/14/2024  2.  Lora will report right knee pain consistently 0-3/10 on the numeric pain rating scale Baseline: 1.5 - 6.5/10 Goal status: Ongoing   03/14/2024  3.  Improve right knee active range of motion to 0 - 3 - 110 degrees or better Baseline: 0 - 13 - 88 degrees Goal status: Ongoing   03/14/2024  4.  Improve right quadriceps strength as assessed by Delton's ability to walk without the use of an assistive device and send and descend stairs reciprocally with use of a single handrail Baseline: Unable at evaluation Goal status:  Ongoing   03/14/2024  5.  Mitul will be independent and compliant with his long-term maintenance home exercise program at discharge Baseline: Started 02/17/2024 Goal status:  Ongoing   03/14/2024   PLAN:  PT FREQUENCY: 2 x/week  PT DURATION: 6 weeks  PLANNED INTERVENTIONS: 97750- Physical Performance Testing, 97110-Therapeutic exercises, 97530- Therapeutic activity, 97112- Neuromuscular re-education, 97535- Self Care, 02859- Manual therapy, [phone]- Gait training, [phone]- Vasopneumatic device, Patient/Family education, Balance training, Stair training, Joint mobilization, and Cryotherapy  PLAN FOR NEXT SESSION:     Strength and balance, continued education on what to expect post-op, back/hip stretching /strengthening to help improve ambulation stability and manage back symptoms  Susannah Daring, PT, DPT 03/21/24 12:45 PM      Asking for 12 more visits with start date of 03/15/2024 to 04/25/2024  Referring diagnosis? S03.347 Treatment diagnosis? (if different than referring diagnosis) R26.2, R60.0, M62.81, M25.661, M25.561 What was this (referring dx) caused by? [x]  Surgery []  Fall []  Ongoing issue [x]  Arthritis []  Other: ____________   Laterality: [x]  Rt []  Lt []  Both   Check all possible CPT codes:                   *CHOOSE 10 OR LESS*                                See Planned Interventions listed in the Plan section of the Evaluation

## 2024-03-18 NOTE — Therapy (Signed)
 OUTPATIENT PHYSICAL THERAPY TREATMENT   Patient Name: Gregg Smith MRN: [id number] DOB:[birth year], 78 y.o., male Today's Date: 03/18/2024   END OF SESSION:  PT End of Session - 03/18/24 1001     Visit Number 12    Number of Visits 22    Date for PT Re-Evaluation 04/25/24    Authorization Type [employer]    Authorization Time Period 03/15/2024 - 04/25/2024    Progress Note Due on Visit 20    PT Start Time 0940   pt late   PT Stop Time 1011    PT Time Calculation (min) 31 min    Activity Tolerance Patient tolerated treatment well;No increased pain    Behavior During Therapy WFL for tasks assessed/performed              Past Medical History:  Diagnosis Date   Allergy    seasonal    Anxiety    Arthritis    Barrett's esophagus    Cataract    Chicken pox    Colon polyps    hyperplastic   Diverticulosis    Emphysema lung (HCC)    GERD (gastroesophageal reflux disease)    Hemorrhoids    Hyperlipidemia    Hypertension    ILD (interstitial lung disease) (HCC)    12/18/23 Mild basilar fibrotic interstitial lung disease, 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   Procedure: LEFT TOTAL KNEE ARTHROPLASTY;  Surgeon: Donnice Car, MD;  Location: [facility];  Service: Orthopedics;  Laterality: Left;   TOTAL KNEE ARTHROPLASTY Right 02/03/2024   Procedure: ARTHROPLASTY, KNEE, TOTAL;  Surgeon: Harden Jerona GAILS, MD;  Location: [facility] OR;  Service: Orthopedics;  Laterality: Right;   UPPER GASTROINTESTINAL ENDOSCOPY     Patient Active Problem List   Diagnosis Date Noted   Osteoarthritis (arthritis due to wear and  tear of joints) 02/03/2024   Left leg weakness 11/03/2023   Osteoarthritis of lumbar spine with myelopathy 11/03/2023   Chronic pain of right knee 09/21/2023   Centrilobular emphysema (HCC) 06/04/2023   Interstitial lung disease (HCC) 06/04/2023   Chronic cough 02/03/2023   Overactive bladder 09/17/2022   Aortic atherosclerosis (HCC) 03/17/2022   Acute insomnia 10/23/2021   Chronic left-sided low back pain with left-sided sciatica 03/05/2021   Unilateral primary osteoarthritis, right knee 01/31/2021   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   Scoliosis deformity of spine 04/22/2019   Generalized anxiety disorder    Panic disorder    Insomnia    Spinal stenosis of lumbar region 11/30/2018   GERD (gastroesophageal reflux disease) 08/14/2017   S/P left TKA 08/11/2016   S/P knee replacement 08/11/2016   OSA (obstructive sleep apnea) 03/22/2014   Snoring 05/09/2013   Essential hypertension 02/11/2013   Dyslipidemia 02/11/2013   Arthritis 02/11/2013    PCP: Emil Aloysius Schaumann, MD  REFERRING PROVIDER: Harden Jerona GAILS, MD   REFERRING DIAG:  M17.11 (ICD-10-CM) - Primary osteoarthritis of right knee  M17.11 (ICD-10-CM) - Unilateral primary osteoarthritis, right knee  S03.348 (ICD-10-CM) - Status post right knee replacement    THERAPY DIAG:  Localized edema  Difficulty in walking, not elsewhere classified  Stiffness of right knee, not elsewhere classified  Muscle weakness (generalized)  Acute pain of right knee  Rationale for Evaluation and Treatment: Rehabilitation  ONSET DATE: February 03, 2024  SUBJECTIVE:   SUBJECTIVE STATEMENT:   Having sciatica R LE the doctor said that therapy could work on this as well. I want you to tell me things to do with it at home, had PT before and none of it really helped much at all to be honest. Can't remember what they did at all for the sciatica though.     PERTINENT HISTORY: Arthritis, sciatica, emphysema, HLD, HTN,  left TKA (~6 years ago), former 40-year smoker  PAIN:  NPRS scale: R knee 0-1/10, L sciatica pain around 3/10  Pain location: Rt knee and  L sciatica in R LE too  Pain description: Achy, stiff, discomfort, sciatica pains  Aggravating factors: stretching end ranges, sometimes during day. For sciatica sitting and slouching makes it worse  Relieving factors: prescription meds, OTC meds, for sciatica walking makes it better  PRECAUTIONS: Knee and Back  RED FLAGS: None   WEIGHT BEARING RESTRICTIONS: No  FALLS:  Has patient fallen in last 6 months? No  LIVING ENVIRONMENT: Lives with: lives with their family and lives with their son Lives in: House/apartment Stairs: Is doing stairs with the handrail and a cane Has following equipment at home: Single point cane and Environmental consultant - 2 wheeled  OCCUPATION: Retired PHD  PLOF: Independent  PATIENT GOALS: Be able to weed and garden, do yard work without knee or back restrictions  NEXT MD VISIT: 03/15/2024  OBJECTIVE:  Note: Objective measures were completed at Evaluation unless otherwise noted.  DIAGNOSTIC FINDINGS: Radiographs of the right knee show stable alignment of total knee  arthroplasty hardware.  There is no complicating feature.   PATIENT SURVEYS:  Patient-Specific Activity Scoring Scheme  0 represents "unable to perform." 10 represents "able to perform at prior level. 0 1 2 3 4 5 6 7 8 9  10 (Date and Score)   Activity Eval  02/17/2024  03/14/2024  1.  Sit on a low seat 5/10 8   2.  Clean the floor or garden 4/10  4.5  3.  Do laundry 4/10 8  4.  Take shower 6/10 9  5.  Work on laptop 7/10 10  Score 5.2  7.9   Total score = sum of the activity scores/number of activities Minimum detectable change (90%CI) for average score = 2 points Minimum detectable change (90%CI) for single activity score = 3 points     SENSATION: 02/17/2024 No complaints of peripheral pain or paresthesias  EDEMA:  02/17/2024 Noted and not  objectively assessed   LOWER EXTREMITY ROM:  ROM Left/Right 02/17/2024 Right 02/22/24 Right  Rt 02/29/24 Right 03/02/2024 Right 03/07/24 Right 03/14/2024  Knee flexion 115/88 A: 104 P: 110 A: 98 AA: 108 Active 108 Active Seated 110* Seated heel slide AROM  105  Knee extension 0/-13 -9 (seated LAQ)  AA: -12 Active -7 Seated  LAQ -7* Supine ball roll -5* Seated LAQ AROM -5  Ankle dorsiflexion         Ankle plantarflexion         Ankle inversion         Ankle eversion          (Blank rows = not tested)  LOWER EXTREMITY MMT:  MMT Right 02/17/2024 Left 02/17/2024 Right 03/14/2024  Hip flexion   5/5  Hip extension     Hip abduction     Hip adduction     Hip internal rotation     Hip external rotation     Knee flexion   5/5  Knee extension 2+ 4+ 4/5  Ankle dorsiflexion     Ankle plantarflexion     Ankle inversion     Ankle eversion      (Blank rows = not tested)  GAIT: 03/14/2024: Ambulation into clinic c SPC use.  Can perform household distances without cane per report.  Lacking TKE in stance for Rt leg.   Eval Distance walked: 100 feet Assistive device utilized: Walker - 2 wheeled Level of assistance: Complete Independence Comments: Zorawar was assistive device free before surgery                    TREATMENT          DATE:   03/18/24  Scifit bike seat 9 L3 x8 minutes full rotations for w/u   HS stretch 3x30 seconds hook lying with strap  Tried multiple versions of sciatic nerve flossing- seated version most successful, added this to HEP. Able to reduce sciatic sx by 50% today  Gait training with heavy cues for heel-toe pattern and to SLOW DOWN    03/16/2024 Therex: Recumbent bike seat 9 lvl 3 8 mins  Incline gastroc stretch 30 sec x 3   Neuro Re-ed(balance, muscle activation) Tandem stance 1 min x 2 bilateral with occasional HHA on bars (cues for home use in corner for safety).  Alternating heel/toe lift 2 x 10   TherActivity: Leg press double leg 112 lbs x  15, Rt leg only 2 x 15 50 lbs  Blazepod bosu driving reaction reactive red/green 30 sec x 5                                                                                                                            Gait Training Education and verbal cues for importance of use of SPC to help offload surgery leg to improve gait to avoid antalgic gait changes.  TREATMENT          DATE: 03/14/2024 Therex: UBE LE only lvl 2.5 8 mins for ROM Seated quad set with SLR Rt leg 2 x 10  Incline gastroc stretch 30 sec x 3   Time spent in review of HEP updates from previous in clinic visits.  Encouraged continued consistent frequency.  Handout provided.    Neuro Re-ed(balance, muscle activation) Supine quad set review 5 sec hold x2 Seated quad set 5 sec hold x 10 Rt leg   TherActivity: Step up forward 4 inch step Rt WB with bilateral light hand assist on bar 2 x 10  Sit to stand to sit 18 inch chair s UE assist x 10  Blazepod bosu driving reaction reactive red/green 30 sec x 5  TREATMENT          DATE: 03/10/2024 Therapeutic Exercise: Scifit bike L4; 3 min fwd, 3 min bwd Seated self knee flexion 10x5 Seated knee flexion with PT overpressure x10 Seated hamstring curl red TB 2x10 Seated quad set foot on stool 2x10 Standing runner's stretch with terminal knee ext 2x30 Standing at counter hip abduction red TB 2x10  Manual Therapy: Grade III to IV mobs for knee ext Grade II to IV MWM for knee ext in standing   Gait Training: Amb with SPC x 200' focusing on improving trunk/hip ext during stance, reducing R LE ER Amb without a/d x200' focusing on maintaining normal reciprocal pattern; however, pt demos R lateral lean during stance  Self Care: SpO2 checked throughout session due to concerns with pt's SHOB -- stable during amb at 98-100%; dropped to 94% with standing hip abd Discussed s/s of blood clots and to call MD if continued drop in spO2 at home   TREATMENT           DATE:03/07/2024 Therapeutic Exercise: recumbent bike seat 10 level 3 for 5 min.  Standing TKE green theraband 10 reps 1st set TKE only & 2nd set TKE + SLS tapping LLE to cone. Rail & cane support.  Gastroc incline BLEs 30 sec 3 reps. Heel raised on incline board 10 reps 2 sets Seated LAQ & active knee flexion with contralateral LE opposing motion 10 reps.   Supine heel slide with ball & strap knee flexion 5 sec hold & leg press / knee ext 5 sec hold 10 reps.    Therapeutic Activities: Leg press BLEs 112# 15 reps with PT cues for full motion;  RLE 56# 10 reps  Manual Therapy RLE knee ext grade IV mob with belt standing on incline board Seated PROM Rt knee flexion with manual traction & tibial IR  Vaso Knee elevation with ext stretch medium compression 34* 10 min  PATIENT EDUCATION:  03/14/2024 Education details: HEP udate Person educated: Patient Education method: Programmer, multimedia, Facilities manager, Actor cues, Verbal cues, and Handouts Education comprehension: verbalized understanding, returned demonstration, verbal cues required, tactile cues required, and needs further education  HOME EXERCISE PROGRAM:  Access Code: [email or web address] URL: [email or web address] Date: 03/18/2024 Prepared by: Josette Rough  Exercises - Supine Quadricep Sets  - 2-3 x daily - 7 x weekly - 1 sets - 10 reps - 5 second hold - Supine Heel Slide  - 2-3 x daily - 7 x weekly - 1-2 sets - 10 reps - 2 hold - Seated Knee Flexion AAROM  - 5 x daily - 7 x weekly - 1 sets - 10 reps - 10 seconds hold - Seated Passive Knee Extension  - 3 x daily - 7 x weekly - 1 sets - 1 reps - 3-5 hold - Seated Quad Set  - 2-3 x daily - 7 x weekly - 1 sets - 10 reps - 5 hold - Seated Straight Leg Heel Taps  - 1-2 x daily - 7 x weekly - 3 sets - 10 reps - Hooklying Hamstring Stretch with Strap  - 2 x daily - 7 x weekly - 1 sets - 4 reps - 30 seconds  hold - Seated Sciatic Nerve Glide With Cervical Motion  - 2 x daily - 7  x weekly - 1 sets - 20 reps  ASSESSMENT:  CLINICAL IMPRESSION:  Arrives late today, came in moving very quickly today and strongly perseverating on his sciatica. Had to give heavy cues to slow down  movements and overall gait speed- was more unsteady and almost had some trips from scuffing feet from fast pace of gait when entering clinic. Very internally and externally distracted, actually somewhat hyperactive today compared to his usual- question if this could be partially a side effect from gabapentin , will continue to monitor. Focused on interventions that would address both his knee and his sciatica today, however time and interventions were very limited due to late arrival and time spent on redirection this session.    OBJECTIVE IMPAIRMENTS: Abnormal gait, decreased activity tolerance, decreased balance, decreased endurance, decreased knowledge of condition, difficulty walking, decreased ROM, decreased strength, increased edema, impaired perceived functional ability, and pain.   ACTIVITY LIMITATIONS: bending, standing, squatting, stairs, and locomotion level  PARTICIPATION LIMITATIONS: cleaning, laundry, driving, community activity, and yard work  PERSONAL FACTORS: Arthritis, emphysema, HLD, HTN, left TKA, former 40-year smoker are also affecting patient's functional outcome.   REHAB POTENTIAL: Good  CLINICAL DECISION MAKING: Stable/uncomplicated  EVALUATION COMPLEXITY: Low   GOALS: Goals reviewed with patient? Yes  SHORT TERM GOALS: Target date: 03/16/2024 Josephus will be independent with his day 1 home exercise program Baseline: Started 02/17/2024 Goal status: Met 03/02/2024  2.  Improve right knee active range of motion to 0 - 5 - 105 degrees Baseline: 0 - 13 - 88 degrees Goal status: Ongoing   03/07/2024  3.  Improve right quadriceps strength as assessed by MMT Baseline: 2+/5 Goal status: Ongoing   [id number]   LONG TERM GOALS: Target date: 04/25/2024  Improve patient specific  functional score to at least 7.2 Baseline: 5.2 Goal status:   Ongoing   03/14/2024  2.  Makyle will report right knee pain consistently 0-3/10 on the numeric pain rating scale Baseline: 1.5 - 6.5/10 Goal status: Ongoing   03/14/2024  3.  Improve right knee active range of motion to 0 - 3 - 110 degrees or better Baseline: 0 - 13 - 88 degrees Goal status: Ongoing   03/14/2024  4.  Improve right quadriceps strength as assessed by Gedalya's ability to walk without the use of an assistive device and send and descend stairs reciprocally with use of a single handrail Baseline: Unable at evaluation Goal status: Ongoing   03/14/2024  5.  Zacary will be independent and compliant with his long-term maintenance home exercise program at discharge Baseline: Started 02/17/2024 Goal status:  Ongoing   03/14/2024   PLAN:  PT FREQUENCY: 2 x/week  PT DURATION: 6 weeks  PLANNED INTERVENTIONS: 97750- Physical Performance Testing, 97110-Therapeutic exercises, 97530- Therapeutic activity, 97112- Neuromuscular re-education, 97535- Self Care, 02859- Manual therapy, [phone]- Gait training, [phone]- Vasopneumatic device, Patient/Family education, Balance training, Stair training, Joint mobilization, and Cryotherapy  PLAN FOR NEXT SESSION:     Strength and balance improvements.    Ok to review a few back/hip stretching /strengthening to help improve ambulation stability and manage back symptoms as well. Address sciatica as able   Josette Rough, PT, DPT 03/18/24 10:14 AM    Asking for 12 more visits with start date of 03/15/2024 to 04/25/2024  Referring diagnosis? S03.347 Treatment diagnosis? (if different than referring diagnosis) R26.2, R60.0, M62.81, M25.661, M25.561 What was this (referring dx) caused by? [x]  Surgery []  Fall []  Ongoing issue [x]  Arthritis []  Other: ____________   Laterality: [x]  Rt []  Lt []  Both   Check all possible CPT codes:                   *CHOOSE 10 OR LESS*  See Planned Interventions listed in the Plan section of the Evaluation

## 2024-03-18 NOTE — Telephone Encounter (Signed)
 Copied from CRM [id number]. Topic: General - Other >> Mar 18, 2024  2:38 PM Thersia C wrote: Reason for CRM: Patient called in stating he missed a call from the office, would like a callback

## 2024-03-18 NOTE — Telephone Encounter (Signed)
 Routing to [facility] to advise.

## 2024-03-21 ENCOUNTER — Ambulatory Visit: Payer: Medicare PPO | Admitting: Emergency Medicine

## 2024-03-21 ENCOUNTER — Ambulatory Visit

## 2024-03-21 DIAGNOSIS — M25661 Stiffness of right knee, not elsewhere classified: Secondary | ICD-10-CM

## 2024-03-21 DIAGNOSIS — M25561 Pain in right knee: Secondary | ICD-10-CM | POA: Diagnosis not present

## 2024-03-21 DIAGNOSIS — R262 Difficulty in walking, not elsewhere classified: Secondary | ICD-10-CM | POA: Diagnosis not present

## 2024-03-21 DIAGNOSIS — R600 Localized edema: Secondary | ICD-10-CM

## 2024-03-21 DIAGNOSIS — M6281 Muscle weakness (generalized): Secondary | ICD-10-CM

## 2024-03-21 NOTE — Telephone Encounter (Signed)
 Spoke to patient

## 2024-03-21 NOTE — Telephone Encounter (Signed)
 Please contact to try and schedule sooner follow up appt. Thanks!

## 2024-03-21 NOTE — Telephone Encounter (Signed)
 Okay to stop and monitor symptoms.

## 2024-03-21 NOTE — Telephone Encounter (Signed)
 Message sent via MyChart regarding this.

## 2024-03-21 NOTE — Telephone Encounter (Signed)
 Patient returned call and I spoke to patient, he verbalized understanding and had no further questions.

## 2024-03-22 ENCOUNTER — Other Ambulatory Visit (HOSPITAL_COMMUNITY): Payer: Self-pay

## 2024-03-22 MED ORDER — OFEV 150 MG PO CAPS: 150.0000 mg | ORAL_CAPSULE | Freq: Two times a day (BID) | ORAL | 1 refills | Status: DC

## 2024-03-22 NOTE — Telephone Encounter (Signed)
 Received notification from [employer] regarding a prior authorization for OFEV . Authorization has been APPROVED from 03/17/2024 to 08/31/2024. Approval letter sent to scan center.  Per test claim, copay for 30 days supply is $0  Patient can fill through [facility]: [phone]  Rx sent to [facility]. Patient notified via MyChart   Sherry Pennant, PharmD, MPH, BCPS, CPP Clinical Pharmacist (Rheumatology and Pulmonology)

## 2024-03-23 ENCOUNTER — Telehealth: Payer: Self-pay | Admitting: Orthopedic Surgery

## 2024-03-23 NOTE — Addendum Note (Signed)
 Addended by: DAYNE SHERRY RAMAN on: 03/23/2024 10:13 AM   Modules accepted: Level of Service

## 2024-03-23 NOTE — Telephone Encounter (Signed)
 Orthopedic Telephone Note  Spoke with patient on the phone this afternoon.  He is still having radiating leg pain and paresthesias.  He was previously interested in a second opinion.  I am unsure if he ended up getting that.  He wanted to talk to me again about options for his pain.  He has tried the conservative options that I would typically use for lumbar stenosis.  I talked about options for his pain.  I told him his 2 remaining options would be pain management or surgery.  He was not interested in pain management.  In terms of surgical options, I discussed two-level laminectomy versus lateral interbody fusion to indirectly decompress.  I covered the pros and cons of each of these procedures.  I also covered the typical recovery after both these types of surgeries.  After this conversation, patient wanted to think about it further.  He said he will discuss with his family and some of his friends.  He will let me know how he would like to proceed.  Ozell DELENA Ada, MD Orthopedic Surgeon

## 2024-03-31 ENCOUNTER — Ambulatory Visit: Payer: Self-pay | Admitting: Pulmonary Disease

## 2024-04-04 ENCOUNTER — Encounter: Admitting: Rehabilitative and Restorative Service Providers"

## 2024-04-06 ENCOUNTER — Ambulatory Visit: Admitting: Rehabilitative and Restorative Service Providers"

## 2024-04-06 ENCOUNTER — Encounter: Payer: Self-pay | Admitting: Rehabilitative and Restorative Service Providers"

## 2024-04-06 DIAGNOSIS — R600 Localized edema: Secondary | ICD-10-CM

## 2024-04-06 DIAGNOSIS — M25561 Pain in right knee: Secondary | ICD-10-CM | POA: Diagnosis not present

## 2024-04-06 DIAGNOSIS — R262 Difficulty in walking, not elsewhere classified: Secondary | ICD-10-CM | POA: Diagnosis not present

## 2024-04-06 DIAGNOSIS — M25661 Stiffness of right knee, not elsewhere classified: Secondary | ICD-10-CM | POA: Diagnosis not present

## 2024-04-06 DIAGNOSIS — M6281 Muscle weakness (generalized): Secondary | ICD-10-CM | POA: Diagnosis not present

## 2024-04-06 NOTE — Therapy (Signed)
 OUTPATIENT PHYSICAL THERAPY TREATMENT   Patient Name: Gregg Smith MRN: [id number] DOB:[birth year], 78 y.o., male Today's Date: 04/06/2024   END OF SESSION:  PT End of Session - 04/06/24 0938     Visit Number 14    Number of Visits 22    Date for PT Re-Evaluation 04/25/24    Authorization Type [employer]    Authorization Time Period 03/15/2024 - 04/25/2024    Authorization - Visit Number 4    Authorization - Number of Visits 12    Progress Note Due on Visit 20    PT Start Time 0933    PT Stop Time 1018    PT Time Calculation (min) 45 min    Activity Tolerance Patient tolerated treatment well    Behavior During Therapy WFL for tasks assessed/performed                Past Medical History:  Diagnosis Date   Allergy    seasonal    Anxiety    Arthritis    Barrett's esophagus    Cataract    Chicken pox    Colon polyps    hyperplastic   Diverticulosis    Emphysema lung (HCC)    GERD (gastroesophageal reflux disease)    Hemorrhoids    Hyperlipidemia    Hypertension    ILD (interstitial lung disease) (HCC)    12/18/23 Mild basilar fibrotic interstitial lung disease, 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   Procedure: LEFT TOTAL KNEE ARTHROPLASTY;  Surgeon: Donnice Car, MD;  Location: [facility];  Service: Orthopedics;  Laterality: Left;   TOTAL KNEE ARTHROPLASTY Right 02/03/2024   Procedure: ARTHROPLASTY, KNEE, TOTAL;  Surgeon: Harden Jerona GAILS, MD;  Location: [facility] OR;  Service: Orthopedics;  Laterality: Right;   UPPER GASTROINTESTINAL ENDOSCOPY     Patient Active Problem List   Diagnosis Date  Noted   Osteoarthritis (arthritis due to wear and tear of joints) 02/03/2024   Left leg weakness 11/03/2023   Osteoarthritis of lumbar spine with myelopathy 11/03/2023   Chronic pain of right knee 09/21/2023   Centrilobular emphysema (HCC) 06/04/2023   Interstitial lung disease (HCC) 06/04/2023   Chronic cough 02/03/2023   Overactive bladder 09/17/2022   Aortic atherosclerosis (HCC) 03/17/2022   Acute insomnia 10/23/2021   Chronic left-sided low back pain with left-sided sciatica 03/05/2021   Unilateral primary osteoarthritis, right knee 01/31/2021   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   Scoliosis deformity of spine 04/22/2019   Generalized anxiety disorder    Panic disorder    Insomnia    Spinal stenosis of lumbar region 11/30/2018   GERD (gastroesophageal reflux disease) 08/14/2017   S/P left TKA 08/11/2016   S/P knee replacement 08/11/2016   OSA (obstructive sleep apnea) 03/22/2014   Snoring 05/09/2013   Essential hypertension 02/11/2013   Dyslipidemia 02/11/2013   Arthritis 02/11/2013    PCP: Emil Aloysius Schaumann, MD  REFERRING PROVIDER: Harden Jerona GAILS, MD   REFERRING DIAG:  M17.11 (ICD-10-CM) -  Primary osteoarthritis of right knee  M17.11 (ICD-10-CM) - Unilateral primary osteoarthritis, right knee  Z96.651 (ICD-10-CM) - Status post right knee replacement    THERAPY DIAG:  Localized edema  Difficulty in walking, not elsewhere classified  Stiffness of right knee, not elsewhere classified  Muscle weakness (generalized)  Acute pain of right knee  Rationale for Evaluation and Treatment: Rehabilitation  ONSET DATE: February 03, 2024  SUBJECTIVE:   SUBJECTIVE STATEMENT: Patient states that he has been able to do his HS stretch at home, but needs a refresh on how to appropriately do the nerve flossing. Reports 0/10 pain this morning.    PERTINENT HISTORY: Arthritis, sciatica, emphysema, HLD, HTN, left TKA (~6 years ago), former 40-year  smoker  PAIN:  NPRS scale: R knee 0-1/10, Lt sciatica pain around 3/10  Pain location: Rt knee and  L sciatica in R LE too  Pain description: Achy, stiff, discomfort, sciatica pains  Aggravating factors: stretching end ranges, sometimes during day. For sciatica sitting and slouching makes it worse  Relieving factors: prescription meds, OTC meds, for sciatica walking makes it better  PRECAUTIONS: Knee and Back  RED FLAGS: None   WEIGHT BEARING RESTRICTIONS: No  FALLS:  Has patient fallen in last 6 months? No  LIVING ENVIRONMENT: Lives with: lives with their family and lives with their son Lives in: House/apartment Stairs: Is doing stairs with the handrail and a cane Has following equipment at home: Single point cane and Environmental consultant - 2 wheeled  OCCUPATION: Retired PHD  PLOF: Independent  PATIENT GOALS: Be able to weed and garden, do yard work without knee or back restrictions  NEXT MD VISIT: 03/15/2024  OBJECTIVE:  Note: Objective measures were completed at Evaluation unless otherwise noted.  DIAGNOSTIC FINDINGS: Radiographs of the right knee show stable alignment of total knee  arthroplasty hardware.  There is no complicating feature.   PATIENT SURVEYS:  Patient-Specific Activity Scoring Scheme  0 represents "unable to perform." 10 represents "able to perform at prior level. 0 1 2 3 4 5 6 7 8 9  10 (Date and Score)   Activity Eval  02/17/2024  03/14/2024  1.  Sit on a low seat 5/10 8   2.  Clean the floor or garden 4/10  4.5  3.  Do laundry 4/10 8  4.  Take shower 6/10 9  5.  Work on laptop 7/10 10  Score 5.2  7.9   Total score = sum of the activity scores/number of activities Minimum detectable change (90%CI) for average score = 2 points Minimum detectable change (90%CI) for single activity score = 3 points     SENSATION: 02/17/2024 No complaints of peripheral pain or paresthesias  EDEMA:  02/17/2024 Noted and not objectively assessed   LOWER EXTREMITY  ROM:  ROM Left/Right 02/17/2024 Right 02/22/24 Right  Rt 02/29/24 Right 03/02/2024 Right 03/07/24 Right 03/14/2024  Knee flexion 115/88 A: 104 P: 110 A: 98 AA: 108 Active 108 Active Seated 110* Seated heel slide AROM  105  Knee extension 0/-13 -9 (seated LAQ)  AA: -12 Active -7 Seated  LAQ -7* Supine ball roll -5* Seated LAQ AROM -5  Ankle dorsiflexion         Ankle plantarflexion         Ankle inversion         Ankle eversion          (Blank rows = not tested)  LOWER EXTREMITY MMT:  MMT Right 02/17/2024 Left 02/17/2024 Right 03/14/2024  Hip flexion  5/5  Hip extension     Hip abduction     Hip adduction     Hip internal rotation     Hip external rotation     Knee flexion   5/5  Knee extension 2+ 4+ 4/5  Ankle dorsiflexion     Ankle plantarflexion     Ankle inversion     Ankle eversion      (Blank rows = not tested)  FUNCTIONAL TESTING: 04/06/2024 18 inch chair sit to stand without UE assist on 1st try.   GAIT: 03/14/2024: Ambulation into clinic c SPC use.  Can perform household distances without cane per report.  Lacking TKE in stance for Rt leg.   Eval Distance walked: 100 feet Assistive device utilized: Walker - 2 wheeled Level of assistance: Complete Independence Comments: Tredarius was assistive device free before surgery                    TREATMENT          DATE: 04/06/2024 Therex: Recumbent bike seat 10 7 mins for ROM Incline gastroc stretch 30 sec x 3 bilateral Seated Rt leg quad set with SLR 2 x 10   Review of HEP with handout provided again with updates.  Education performed during vaso time.    TherActivity (to improve stairs, ambulation, squatting) Leg press double leg 125 lbs x 15, single leg Rt 2 x 15  Forward step up 6 inch step Rt leg WB with slow lowering reverse with light hand assist on bar.  Sit to stand to sit 18 inch chair with slow lowering focus x 10  Neuro Re-ed Alternating heel/toe lifts on ground with light assist of hands on bar x 20  each way Tandem stance 30 sec x 3 bilaterally with occasional HHA on bar , SBA  Vaso 10 mins medium compression to Rt knee in elevation, 34 degrees.  Review of HEP during vaso use.     TREATMENT          DATE: 03/21/2024 TherEx: SciFit bike starting at seat 11 moving to seat 9 after 2 minutes, total of 8 minutes Seated hamstring stretch 2x30s each  Incline gastroc stretch 3x30s Leg press B LE with 2x15 with 125#; increased depth for second set  Leg press with R LE only 1x15 with 62#  Self-Care: Edema management, typical post-op course, what to expect when completing PT, reviewed seated sciatic nerve flossing technique   Vaso:  Supine with LEs on wedge; R LE with medium pressure at 34deg for 10 minutes   TREATMENT          DATE:03/18/24  Scifit bike seat 9 L3 x8 minutes full rotations for w/u   HS stretch 3x30 seconds hook lying with strap  Tried multiple versions of sciatic nerve flossing- seated version most successful, added this to HEP. Able to reduce sciatic sx by 50% today  Gait training with heavy cues for heel-toe pattern and to SLOW DOWN    TREATMENT          DATE:03/16/2024 Therex: Recumbent bike seat 9 lvl 3 8 mins  Incline gastroc stretch 30 sec x 3   Neuro Re-ed(balance, muscle activation) Tandem stance 1 min x 2 bilateral with occasional HHA on bars (cues for home use in corner for safety).  Alternating heel/toe lift 2 x 10   TherActivity: Leg press double leg 112 lbs x 15, Rt leg only 2 x 15 50 lbs  Blazepod bosu driving reaction reactive red/green  30 sec x 5                                                                                                                            Gait Training Education and verbal cues for importance of use of SPC to help offload surgery leg to improve gait to avoid antalgic gait changes.   PATIENT EDUCATION:  04/06/2024 Education details: HEP udate Person educated: Patient Education method: Programmer, multimedia, Facilities manager,  Actor cues, Verbal cues, and Handouts Education comprehension: verbalized understanding, returned demonstration, verbal cues required, tactile cues required, and needs further education  HOME EXERCISE PROGRAM:  Access Code: [email or web address] URL: [email or web address] Date: 04/06/2024 Prepared by: Ozell Silvan  Exercises - Supine Quadricep Sets  - 2-3 x daily - 7 x weekly - 1 sets - 10 reps - 5 second hold - Supine Heel Slide  - 2-3 x daily - 7 x weekly - 1-2 sets - 10 reps - 2 hold - Seated Knee Flexion AAROM  - 5 x daily - 7 x weekly - 1 sets - 10 reps - 10 seconds hold - Seated Passive Knee Extension  - 3 x daily - 7 x weekly - 1 sets - 1 reps - 3-5 hold - Seated Quad Set  - 2-3 x daily - 7 x weekly - 1 sets - 10 reps - 5 hold - Seated SLR  - 1-2 x daily - 7 x weekly - 1-2 sets - 10-15 reps - 2 hold - Sit to Stand  - 3 x daily - 7 x weekly - 1 sets - 10 reps - Hooklying Hamstring Stretch with Strap  - 2 x daily - 7 x weekly - 1 sets - 4 reps - 30 seconds  hold - Seated Sciatic Nerve Glide With Cervical Motion  - 2 x daily - 7 x weekly - 1 sets - 20 reps Alternating heel/toe lifts  ASSESSMENT:  CLINICAL IMPRESSION: Continued edema in Rt leg noted which can impact symptoms and general mobility.  Gait still impacted by hurried gait speed with variable toe catching in swing on both legs, more noted on Lt due to Rt leg stance deficits at times.  Pt still presented with medical necessity for continued skilled PT services over next few weeks to continue to make gains in mobility and strength, balance control.    OBJECTIVE IMPAIRMENTS: Abnormal gait, decreased activity tolerance, decreased balance, decreased endurance, decreased knowledge of condition, difficulty walking, decreased ROM, decreased strength, increased edema, impaired perceived functional ability, and pain.   ACTIVITY LIMITATIONS: bending, standing, squatting, stairs, and locomotion level  PARTICIPATION LIMITATIONS:  cleaning, laundry, driving, community activity, and yard work  PERSONAL FACTORS: Arthritis, emphysema, HLD, HTN, left TKA, former 40-year smoker are also affecting patient's functional outcome.   REHAB POTENTIAL: Good  CLINICAL DECISION MAKING: Stable/uncomplicated  EVALUATION COMPLEXITY: Low   GOALS: Goals reviewed with patient? Yes  SHORT TERM GOALS: Target date: 03/16/2024 Delroy will be independent with his day 1  home exercise program Baseline: Started 02/17/2024 Goal status: Met 03/02/2024  2.  Improve right knee active range of motion to 0 - 5 - 105 degrees Baseline: 0 - 13 - 88 degrees Goal status: Ongoing   03/07/2024  3.  Improve right quadriceps strength as assessed by MMT Baseline: 2+/5 Goal status: Ongoing   [id number]   LONG TERM GOALS: Target date: 04/25/2024  Improve patient specific functional score to at least 7.2 Baseline: 5.2 Goal status:   Ongoing   03/14/2024  2.  Terrel will report right knee pain consistently 0-3/10 on the numeric pain rating scale Baseline: 1.5 - 6.5/10 Goal status: Ongoing   03/14/2024  3.  Improve right knee active range of motion to 0 - 3 - 110 degrees or better Baseline: 0 - 13 - 88 degrees Goal status: Ongoing   03/14/2024  4.  Improve right quadriceps strength as assessed by Lang's ability to walk without the use of an assistive device and send and descend stairs reciprocally with use of a single handrail Baseline: Unable at evaluation Goal status: Ongoing   03/14/2024  5.  Arick will be independent and compliant with his long-term maintenance home exercise program at discharge Baseline: Started 02/17/2024 Goal status:  Ongoing   03/14/2024   PLAN:  PT FREQUENCY: 2 x/week  PT DURATION: 6 weeks  PLANNED INTERVENTIONS: 97750- Physical Performance Testing, 97110-Therapeutic exercises, 97530- Therapeutic activity, 97112- Neuromuscular re-education, 97535- Self Care, 02859- Manual therapy, [phone]- Gait training, [phone]- Vasopneumatic  device, Patient/Family education, Balance training, Stair training, Joint mobilization, and Cryotherapy  PLAN FOR NEXT SESSION:    Recheck ROM/strength/LTG check   Continued strengthening, balance improvements.  Working towards discharge by end of [employer] cert timeline hopefully.   Ozell Silvan, PT, DPT, OCS, ATC 04/06/24  10:11 AM      Asking for 12 more visits with start date of 03/15/2024 to 04/25/2024  Referring diagnosis? S03.347 Treatment diagnosis? (if different than referring diagnosis) R26.2, R60.0, M62.81, M25.661, M25.561 What was this (referring dx) caused by? [x]  Surgery []  Fall []  Ongoing issue [x]  Arthritis []  Other: ____________   Laterality: [x]  Rt []  Lt []  Both   Check all possible CPT codes:                   *CHOOSE 10 OR LESS*                                See Planned Interventions listed in the Plan section of the Evaluation

## 2024-04-11 DIAGNOSIS — F411 Generalized anxiety disorder: Secondary | ICD-10-CM | POA: Diagnosis not present

## 2024-04-11 DIAGNOSIS — G4700 Insomnia, unspecified: Secondary | ICD-10-CM | POA: Diagnosis not present

## 2024-04-19 NOTE — Progress Notes (Unsigned)
 Office Visit Note  Patient: Gregg Smith             Date of Birth: 17-Apr-1946           MRN: [id number]             PCP: Purcell Emil Schanz, MD Referring: Kara Dorn NOVAK, MD Visit Date: 04/21/2024 Professor    Subjective:  No chief complaint on file.  History of Present Illness: Gregg Smith is a 78 y.o. male who is presenting for evaluation of pulmonary fibrosis.   Patient reports that his pulmonary fibrosis was found on imaging, but he does not remember exactly what was being evaluated when this was found. He denies any noticeable pulmonary symptoms, but his son is present and admits to noticing shallow breathing and hyperventilation around the time of the pandemic (when son moved in with patient). He denies significant cough or hemoptysis.   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  He denies dry eyes, admits to some mild dry mouth. He denies oral ulcers. He denies photosensitivity. Possible mildly itchy rash after first starting Ofev , now cleared up and no further rashes. No raynaud's. He denies lymph node swelling, fevers, night sweats, unexplainable weight loss, muscle weakness (had brief weakness in left leg for a few days). He denies alopecia or digital ulcers. He denies any joint pain other than recent right TKA.  [place]. He admits to smoking, stopped ~30 years ago. He admits to a hobby of gardening where he was exposed to round-up. He denies blood in his stool. He drinks about 3 beers a week.   He admits to a significant autoimmune hx in his family, including niece with lupus, another niece with unidentified autoimmune disease. He has one sister w/ autoimmune disease and another sister w/ 1:320 ANA w/ possible sjogrens.  He denies significant cough, coughing up blood. He denies recurrent sinusitis. He denies hx of chemotherapy or radiation.     Activities of  Daily Living: Patient reports morning stiffness for 5-10 minutes.   Patient Denies nocturnal pain.  Difficulty dressing/grooming: Denies Difficulty climbing stairs: Denies Difficulty getting out of chair: Denies Difficulty using hands for taps, buttons, cutlery, and/or writing: Denies  Review of Systems  Constitutional:  Negative for fatigue.  HENT:  Positive for mouth dryness. Negative for mouth sores.   Eyes:  Negative for dryness.  Respiratory:  Positive for shortness of breath.   Cardiovascular:  Negative for chest pain and palpitations.  Gastrointestinal:  Negative for blood in stool, constipation and diarrhea.  Endocrine: Negative for increased urination.  Genitourinary:  Negative for involuntary urination.  Musculoskeletal:  Positive for gait problem, joint swelling and morning stiffness. Negative for joint pain, joint pain, myalgias, muscle weakness, muscle tenderness and myalgias.  Skin:  Positive for rash. Negative for color change, hair loss and sensitivity to sunlight.  Allergic/Immunologic: Positive for susceptible to infections.  Neurological:  Positive for dizziness. Negative for headaches.  Hematological:  Negative for swollen glands.  Psychiatric/Behavioral:  Positive for depressed mood and sleep disturbance. The patient is nervous/anxious.      Rheum History: # Diagnosed in ***.  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/03/2023   Chronic pain of right knee 09/21/2023   Centrilobular emphysema (HCC) 06/04/2023   Interstitial lung disease (HCC) 06/04/2023   Chronic cough 02/03/2023   Overactive bladder 09/17/2022    Aortic atherosclerosis (HCC) 03/17/2022   Acute insomnia 10/23/2021   Chronic left-sided low back pain with left-sided sciatica 03/05/2021   Unilateral primary osteoarthritis, right knee 01/31/2021   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   Scoliosis deformity of spine 04/22/2019   Generalized anxiety disorder    Panic disorder    Insomnia    Spinal stenosis of lumbar region 11/30/2018   GERD (gastroesophageal reflux disease) 08/14/2017   S/P left TKA 08/11/2016   S/P knee replacement 08/11/2016   OSA (obstructive sleep apnea) 03/22/2014   Snoring 05/09/2013   Essential hypertension 02/11/2013   Dyslipidemia 02/11/2013   Arthritis 02/11/2013    Past Medical History:  Diagnosis Date   Allergy    seasonal    Anxiety    Arthritis    Barrett's esophagus    Cataract    Chicken pox    Colon polyps    hyperplastic   Diverticulosis    Emphysema lung (HCC)    GERD (gastroesophageal reflux disease)    Hemorrhoids    Hyperlipidemia    Hypertension    ILD (interstitial lung disease) (HCC)    12/18/23 Mild basilar fibrotic interstitial lung disease, 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   Insomnia    Leg cramps    Measles    Mumps    Obstructive sleep apnea    Pneumonia    Sleep apnea    dental device; no longer uses    Family History  Problem Relation Age of Onset   Alzheimer's disease Mother    Cancer Father    Stomach cancer Father    Aortic aneurysm Son    Heart disease Son    Cancer Maternal Grandmother    Heart disease Maternal Grandfather    Stomach cancer Paternal Grandfather    Colon cancer Neg Hx    Colon polyps Neg Hx    Esophageal cancer Neg Hx    Rectal cancer Neg Hx    Pancreatic cancer Neg Hx    Liver cancer Neg Hx    Past Surgical History:  Procedure Laterality Date   APPENDECTOMY  1957   CATARACT EXTRACTION     COLONOSCOPY     JOINT REPLACEMENT     TONSILLECTOMY     TONSILLECTOMY AND ADENOIDECTOMY  1954   TOTAL KNEE  ARTHROPLASTY Left 08/11/2016   Procedure: LEFT TOTAL KNEE ARTHROPLASTY;  Surgeon: Donnice Car, MD;  Location: [facility];  Service: Orthopedics;  Laterality: Left;   TOTAL KNEE ARTHROPLASTY Right 02/03/2024   Procedure: ARTHROPLASTY, KNEE, TOTAL;  Surgeon: Harden Jerona GAILS, MD;  Location: [facility];  Service: Orthopedics;  Laterality: Right;   UPPER GASTROINTESTINAL ENDOSCOPY     Social History   Social History Narrative   Patient lives alone in his home   Patient is retired   Patient has a Ph.D   Patient has one adult child.   Patient is right-handed.   Patient drinks 1 cup of tea and 1 cup of coffee daily    Immunization History  Administered Date(s) Administered   Fluad Quad(high Dose 65+) 05/31/2020, 05/01/2023   Influenza, High Dose Seasonal PF 05/15/2017, 05/13/2018, 04/19/2019  Influenza-Unspecified 06/01/2014, 07/07/2015, 06/02/2016, 04/19/2019, 05/18/2021   PFIZER(Purple Top)SARS-COV-2 Vaccination 10/14/2019, 11/08/2019, 05/26/2020, 12/09/2020, 05/19/2021   Pneumococcal Conjugate-13 10/06/2014   Pneumococcal Polysaccharide-23 06/18/2007, 08/03/2018   Tdap 04/14/2012, 08/14/2012   Unspecified SARS-COV-2 Vaccination 05/08/2023, 12/23/2023   Zoster Recombinant(Shingrix) 03/05/2021, 05/08/2021   Zoster, Live 08/17/2008     Objective: Vital Signs: There were no vitals taken for this visit.   Physical Exam Vitals and nursing note reviewed.  Constitutional:      Appearance: He is well-developed.  HENT:     Head: Normocephalic and atraumatic.  Eyes:     Conjunctiva/sclera: Conjunctivae normal.     Pupils: Pupils are equal, round, and reactive to light.  Cardiovascular:     Rate and Rhythm: Normal rate and regular rhythm.     Heart sounds: Normal heart sounds.  Pulmonary:     Effort: Pulmonary effort is normal.     Breath sounds: Normal breath sounds.  Abdominal:     General: Bowel sounds are normal.     Palpations: Abdomen is soft.  Musculoskeletal:     Cervical back:  Normal range of motion and neck supple.  Skin:    General: Skin is warm and dry.     Capillary Refill: Capillary refill takes less than 2 seconds.     Findings: Erythema (right shin w/ mild erythema; no warmth or tenderness) present.  Neurological:     Mental Status: He is alert and oriented to person, place, and time.  Psychiatric:        Behavior: Behavior normal.      Musculoskeletal Exam:   CDAI Exam: CDAI Score: -- Patient Global: --; Provider Global: -- Swollen: --; Tender: -- Joint Exam 04/21/2024   No joint exam has been documented for this visit   There is currently no information documented on the homunculus. Go to the Rheumatology activity and complete the homunculus joint exam.  Investigation: No additional findings.  Imaging: No results found.  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  Recent Labs: Lab Results  Component Value Date   WBC 6.1 01/21/2024   HGB 14.5 01/21/2024   PLT 174 01/21/2024   NA 139 03/16/2024   K 3.9 03/16/2024   CL 106 03/16/2024   CO2 28 03/16/2024   GLUCOSE 112 (H) 03/16/2024   BUN 22 03/16/2024   CREATININE 0.95 03/16/2024   BILITOT 0.4 03/16/2024   ALKPHOS 82 03/16/2024   AST 25 03/16/2024   ALT 16 03/16/2024   PROT 7.2 03/16/2024   ALBUMIN 3.8 03/16/2024   CALCIUM  9.3 03/16/2024   GFRAA 88 03/06/2020  ANA 1:640 Atypical P ANCA 1:320 Negative RF, CCP, Centromere, SSA, SSB, anti-smith, CK  Speciality Comments: No specialty comments available.  Procedures:  No procedures performed Allergies: Penicillins   Assessment / Plan:     Visit Diagnoses: No diagnosis found.  Positive ANA No definitive inflammatory arthritis or other compelling features for clinically active systemic autoimmune disorder serving as a unifying diagnosis for patient's overall presentation. Positive ANA need not represent presence of clinically active systemic autoimmune  disease.  Can be positive in the normal population, autoimmune thyroid disease (Graves' disease, Hashimoto's thyroiditis etc.), or infection. ANA can be positive in the healthy population at the following rates: ANA 1:40: 20%-30%, ANA 1:80: 10%-15%, ANA 1:160: 5%, ANA 1:320: 3% positive, healthy relative of an SLE patient: 5%-25% positive (usually low titers), and elderly (age >70 years): up to 70% positive at ANA titer 1:40.   #  High risk medication use  Orders: No orders of the defined types were placed in this encounter.  No orders of the defined types were placed in this encounter.   Face-to-face time spent with patient was *** minutes. Greater than 50% of time was spent in counseling and coordination of care.  Follow-Up Instructions: No follow-ups on file.   Asberry Claw, DO

## 2024-04-21 ENCOUNTER — Ambulatory Visit: Payer: Self-pay | Admitting: Rehabilitative and Restorative Service Providers"

## 2024-04-21 ENCOUNTER — Encounter: Payer: Self-pay | Admitting: Rehabilitative and Restorative Service Providers"

## 2024-04-21 ENCOUNTER — Ambulatory Visit

## 2024-04-21 ENCOUNTER — Encounter: Payer: Self-pay | Admitting: Pulmonary Disease

## 2024-04-21 VITALS — BP 152/88 | HR 74 | Resp 14 | Ht 70.0 in | Wt 245.6 lb

## 2024-04-21 DIAGNOSIS — M25561 Pain in right knee: Secondary | ICD-10-CM

## 2024-04-21 DIAGNOSIS — J8410 Pulmonary fibrosis, unspecified: Secondary | ICD-10-CM | POA: Diagnosis not present

## 2024-04-21 DIAGNOSIS — R262 Difficulty in walking, not elsewhere classified: Secondary | ICD-10-CM

## 2024-04-21 DIAGNOSIS — M6281 Muscle weakness (generalized): Secondary | ICD-10-CM

## 2024-04-21 DIAGNOSIS — Z5181 Encounter for therapeutic drug level monitoring: Secondary | ICD-10-CM

## 2024-04-21 DIAGNOSIS — R768 Other specified abnormal immunological findings in serum: Secondary | ICD-10-CM

## 2024-04-21 DIAGNOSIS — R600 Localized edema: Secondary | ICD-10-CM

## 2024-04-21 DIAGNOSIS — M25661 Stiffness of right knee, not elsewhere classified: Secondary | ICD-10-CM | POA: Diagnosis not present

## 2024-04-21 DIAGNOSIS — J849 Interstitial pulmonary disease, unspecified: Secondary | ICD-10-CM

## 2024-04-21 NOTE — Therapy (Signed)
 OUTPATIENT PHYSICAL THERAPY TREATMENT / DISCHARGE   Patient Name: Gregg Smith MRN: [id number] DOB:04/30/1946, 78 y.o., male Today's Date: 04/21/2024  PHYSICAL THERAPY DISCHARGE SUMMARY  Visits from Start of Care: 15  Current functional level related to goals / functional outcomes: See note   Remaining deficits: See note   Education / Equipment: HEP  Patient goals were met. Patient is being discharged due to meeting the stated rehab goals.    END OF SESSION:  PT End of Session - 04/21/24 1612     Visit Number 15    Number of Visits 22    Date for PT Re-Evaluation 04/25/24    Authorization Type [employer]    Authorization Time Period 03/15/2024 - 04/25/2024    Authorization - Visit Number 5    Authorization - Number of Visits 12    Progress Note Due on Visit 20    PT Start Time 1600    PT Stop Time 1638    PT Time Calculation (min) 38 min    Activity Tolerance Patient tolerated treatment well    Behavior During Therapy WFL for tasks assessed/performed            Past Medical History:  Diagnosis Date   Allergy    seasonal    Anxiety    Arthritis    Barrett's esophagus    Cataract    Chicken pox    Colon polyps    hyperplastic   Diverticulosis    Emphysema lung (HCC)    GERD (gastroesophageal reflux disease)    Hemorrhoids    Hyperlipidemia    Hypertension    ILD (interstitial lung disease) (HCC)    12/18/23 Mild basilar fibrotic interstitial lung disease, 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   Insomnia    Leg cramps    Measles    Mumps    Obstructive sleep apnea    Pneumonia    Pulmonary fibrosis (HCC)    Sleep apnea    dental device; no longer uses   Past Surgical History:  Procedure Laterality Date   APPENDECTOMY  1957   CATARACT EXTRACTION     COLONOSCOPY     JOINT REPLACEMENT     TONSILLECTOMY     TONSILLECTOMY AND ADENOIDECTOMY  1954   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   Procedure: LEFT TOTAL KNEE ARTHROPLASTY;   Surgeon: Gregg Car, MD;  Location: [facility];  Service: Orthopedics;  Laterality: Left;   TOTAL KNEE ARTHROPLASTY Right 02/03/2024   Procedure: ARTHROPLASTY, KNEE, TOTAL;  Surgeon: Gregg Jerona GAILS, MD;  Location: [facility] OR;  Service: Orthopedics;  Laterality: Right;   UPPER GASTROINTESTINAL ENDOSCOPY     Patient Active Problem List   Diagnosis Date Noted   Osteoarthritis (arthritis due to wear and tear of joints) 02/03/2024   Left leg weakness 11/03/2023   Osteoarthritis of lumbar spine with myelopathy 11/03/2023   Chronic pain of right knee 09/21/2023   Centrilobular emphysema (HCC) 06/04/2023   Interstitial lung disease (HCC) 06/04/2023   Chronic cough 02/03/2023   Overactive bladder 09/17/2022   Aortic atherosclerosis (HCC) 03/17/2022   Acute insomnia 10/23/2021   Chronic left-sided low back pain with left-sided sciatica 03/05/2021   Unilateral primary osteoarthritis, right knee 01/31/2021   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   Scoliosis deformity of spine 04/22/2019   Generalized anxiety disorder    Panic disorder    Insomnia    Spinal stenosis of lumbar region 11/30/2018   GERD (gastroesophageal reflux disease) 08/14/2017  S/P left TKA 08/11/2016   S/P knee replacement 08/11/2016   OSA (obstructive sleep apnea) 03/22/2014   Snoring 05/09/2013   Essential hypertension 02/11/2013   Dyslipidemia 02/11/2013   Arthritis 02/11/2013    PCP: Gregg Aloysius Schaumann, MD  REFERRING PROVIDER: Harden Jerona GAILS, MD   REFERRING DIAG:  M17.11 (ICD-10-CM) - Primary osteoarthritis of right knee  M17.11 (ICD-10-CM) - Unilateral primary osteoarthritis, right knee  Z96.651 (ICD-10-CM) - Status post right knee replacement    THERAPY DIAG:  Localized edema  Difficulty in walking, not elsewhere classified  Stiffness of right knee, not elsewhere classified  Muscle weakness (generalized)  Acute pain of right knee  Rationale for Evaluation and Treatment:  Rehabilitation  ONSET DATE: February 03, 2024  SUBJECTIVE:   SUBJECTIVE STATEMENT: Pt indicated indicated doing pretty well.  Pt indicated still having some swelling off and on.  Reported occasional pain in knee in back.     PERTINENT HISTORY: Arthritis, sciatica, emphysema, HLD, HTN, left TKA (~6 years ago), former 40-year smoker  PAIN:  NPRS scale: 1/10 Pain location: Rt knee and  L sciatica in R LE too  Pain description: Achy, stiff, discomfort, sciatica pains  Aggravating factors: stretching end ranges, sometimes during day. For sciatica sitting and slouching makes it worse  Relieving factors: prescription meds, OTC meds, for sciatica walking makes it better  PRECAUTIONS: Knee and Back  RED FLAGS: None   WEIGHT BEARING RESTRICTIONS: No  FALLS:  Has patient fallen in last 6 months? No  LIVING ENVIRONMENT: Lives with: lives with their family and lives with their son Lives in: House/apartment Stairs: Is doing stairs with the handrail and a cane Has following equipment at home: Single point cane and Environmental consultant - 2 wheeled  OCCUPATION: Retired PHD  PLOF: Independent  PATIENT GOALS: Be able to weed and garden, do yard work without knee or back restrictions  NEXT MD VISIT: 03/15/2024  OBJECTIVE:  Note: Objective measures were completed at Evaluation unless otherwise noted.  DIAGNOSTIC FINDINGS: Radiographs of the right knee show stable alignment of total knee  arthroplasty hardware.  There is no complicating feature.   PATIENT SURVEYS:  Patient-Specific Activity Scoring Scheme  0 represents "unable to perform." 10 represents "able to perform at prior level. 0 1 2 3 4 5 6 7 8 9  10 (Date and Score)   Activity Eval  02/17/2024  03/14/2024 04/21/2024  1.  Sit on a low seat 5/10 8  8   2.  Clean the floor or garden 4/10  4.5 6.5  3.  Do laundry 4/10 8 9   4.  Take shower 6/10 9 9   5.  Work on laptop 7/10 10 10   Score 5.2  7.9 8.5   Total score = sum of the activity  scores/number of activities Minimum detectable change (90%CI) for average score = 2 points Minimum detectable change (90%CI) for single activity score = 3 points     SENSATION: 02/17/2024 No complaints of peripheral pain or paresthesias  EDEMA:  02/17/2024 Noted and not objectively assessed   LOWER EXTREMITY ROM:  ROM Left/Right 02/17/2024 Right 02/22/24 Right  Rt 02/29/24 Right 03/02/2024 Right 03/07/24 Right 03/14/2024 Right 04/21/2024  Knee flexion 115/88 A: 104 P: 110 A: 98 AA: 108 Active 108 Active Seated 110* Seated heel slide AROM  105 114 AROM supine heel slide  Knee extension 0/-13 -9 (seated LAQ)  AA: -12 Active -7 Seated  LAQ -7* Supine ball roll -5* Seated LAQ AROM -5 -3 in seated LAQ  Ankle dorsiflexion          Ankle plantarflexion          Ankle inversion          Ankle eversion           (Blank rows = not tested)  LOWER EXTREMITY MMT:  MMT Right 02/17/2024 Left 02/17/2024 Right 03/14/2024 Right 04/21/2024  Hip flexion   5/5   Hip extension      Hip abduction      Hip adduction      Hip internal rotation      Hip external rotation      Knee flexion   5/5 5/5  Knee extension 2+ 4+ 4/5 5/5  Ankle dorsiflexion      Ankle plantarflexion      Ankle inversion      Ankle eversion       (Blank rows = not tested)  FUNCTIONAL TESTING: 04/06/2024 18 inch chair sit to stand without UE assist on 1st try.   GAIT: 04/21/2024:  Independent ambulation able.  Does carry [facility].   03/14/2024: Ambulation into clinic c SPC use.  Can perform household distances without cane per report.  Lacking TKE in stance for Rt leg.   Eval Distance walked: 100 feet Assistive device utilized: Walker - 2 wheeled Level of assistance: Complete Independence Comments: Gregg Smith was assistive device free before surgery                    TREATMENT          DATE: 04/21/2024 Therex: Recumbent bike lvl 3 seat 10 level 2-3  - 10 mins  Supine heel slide AROM 5 sec hold x 10 Rt leg Verbal review  of HEP use.   TherActivity Flight of stairs reciprocal pattern up/down stairs with single hand rail assist.  Leg press double leg 125 lbs 2 x 15, single leg Rt 2 x 15 56 lbs  Neuro Re-ed Alternating heel/toe lifts on ground with light assist of hands on bar x 20 each way Tandem stance 1 min x 2  bilaterally with occasional HHA on bar , SBA   TREATMENT          DATE: 04/06/2024 Therex: Recumbent bike seat 10 7 mins for ROM Incline gastroc stretch 30 sec x 3 bilateral Seated Rt leg quad set with SLR 2 x 10   Review of HEP with handout provided again with updates.  Education performed during vaso time.    TherActivity (to improve stairs, ambulation, squatting) Leg press double leg 125 lbs x 15, single leg Rt 2 x 15  Forward step up 6 inch step Rt leg WB with slow lowering reverse with light hand assist on bar.  Sit to stand to sit 18 inch chair with slow lowering focus x 10  Neuro Re-ed Alternating heel/toe lifts on ground with light assist of hands on bar x 20 each way Tandem stance 30 sec x 3 bilaterally with occasional HHA on bar , SBA  Vaso 10 mins medium compression to Rt knee in elevation, 34 degrees.  Review of HEP during vaso use.     TREATMENT          DATE: 03/21/2024 TherEx: SciFit bike starting at seat 11 moving to seat 9 after 2 minutes, total of 8 minutes Seated hamstring stretch 2x30s each  Incline gastroc stretch 3x30s Leg press B LE with 2x15 with 125#; increased depth for second set  Leg press with R LE only  1x15 with 62#  Self-Care: Edema management, typical post-op course, what to expect when completing PT, reviewed seated sciatic nerve flossing technique   Vaso:  Supine with LEs on wedge; R LE with medium pressure at 34deg for 10 minutes   TREATMENT          DATE:03/18/24  Scifit bike seat 9 L3 x8 minutes full rotations for w/u   HS stretch 3x30 seconds hook lying with strap  Tried multiple versions of sciatic nerve flossing- seated version most  successful, added this to HEP. Able to reduce sciatic sx by 50% today  Gait training with heavy cues for heel-toe pattern and to SLOW DOWN    PATIENT EDUCATION:  04/06/2024 Education details: HEP udate Person educated: Patient Education method: Programmer, multimedia, Demonstration, Actor cues, Verbal cues, and Handouts Education comprehension: verbalized understanding, returned demonstration, verbal cues required, tactile cues required, and needs further education  HOME EXERCISE PROGRAM:  Access Code: [email or web address] URL: [email or web address] Date: 04/06/2024 Prepared by: Gregg Smith  Exercises - Supine Quadricep Sets  - 2-3 x daily - 7 x weekly - 1 sets - 10 reps - 5 second hold - Supine Heel Slide  - 2-3 x daily - 7 x weekly - 1-2 sets - 10 reps - 2 hold - Seated Knee Flexion AAROM  - 5 x daily - 7 x weekly - 1 sets - 10 reps - 10 seconds hold - Seated Passive Knee Extension  - 3 x daily - 7 x weekly - 1 sets - 1 reps - 3-5 hold - Seated Quad Set  - 2-3 x daily - 7 x weekly - 1 sets - 10 reps - 5 hold - Seated SLR  - 1-2 x daily - 7 x weekly - 1-2 sets - 10-15 reps - 2 hold - Sit to Stand  - 3 x daily - 7 x weekly - 1 sets - 10 reps - Hooklying Hamstring Stretch with Strap  - 2 x daily - 7 x weekly - 1 sets - 4 reps - 30 seconds  hold - Seated Sciatic Nerve Glide With Cervical Motion  - 2 x daily - 7 x weekly - 1 sets - 20 reps Alternating heel/toe lifts  ASSESSMENT:  CLINICAL IMPRESSION: Pt has attended 15 visits overall at this time.  See objective data for updated information. At this time, Pt was appropriate for HEP due to knowledge of plan going forward and general gains.     OBJECTIVE IMPAIRMENTS: Abnormal gait, decreased activity tolerance, decreased balance, decreased endurance, decreased knowledge of condition, difficulty walking, decreased ROM, decreased strength, increased edema, impaired perceived functional ability, and pain.   ACTIVITY LIMITATIONS: bending,  standing, squatting, stairs, and locomotion level  PARTICIPATION LIMITATIONS: cleaning, laundry, driving, community activity, and yard work  PERSONAL FACTORS: Arthritis, emphysema, HLD, HTN, left TKA, former 40-year smoker are also affecting patient's functional outcome.   REHAB POTENTIAL: Good  CLINICAL DECISION MAKING: Stable/uncomplicated  EVALUATION COMPLEXITY: Low   GOALS: Goals reviewed with patient? Yes  SHORT TERM GOALS: Target date: 03/16/2024 Gregg Smith will be independent with his day 1 home exercise program Baseline: Started 02/17/2024 Goal status: Met 03/02/2024  2.  Improve right knee active range of motion to 0 - 5 - 105 degrees Baseline: 0 - 13 - 88 degrees Goal status: partially met at time  3.  Improve right quadriceps strength as assessed by MMT Baseline: 2+/5 Goal status: met     LONG TERM GOALS: Target date: 04/25/2024  Improve patient specific functional score  to at least 7.2 Baseline: 5.2 Goal status:   met 04/21/2024  2.  Gregg Smith will report right knee pain consistently 0-3/10 on the numeric pain rating scale Baseline: 1.5 - 6.5/10 Goal status: met 04/21/2024  3.  Improve right knee active range of motion to 0 - 3 - 110 degrees or better Baseline: 0 - 13 - 88 degrees Goal status: met 04/21/2024  4.  Improve right quadriceps strength as assessed by Gregg Smith's ability to walk without the use of an assistive device and send and descend stairs reciprocally with use of a single handrail Baseline: Unable at evaluation Goal status: met 04/21/2024  5.  Gregg Smith will be independent and compliant with his long-term maintenance home exercise program at discharge Baseline: Started 02/17/2024 Goal status:  met 04/21/2024   PLAN:  PT FREQUENCY: 2 x/week  PT DURATION: 6 weeks  PLANNED INTERVENTIONS: 97750- Physical Performance Testing, 97110-Therapeutic exercises, 97530- Therapeutic activity, 97112- Neuromuscular re-education, 97535- Self Care, 02859- Manual therapy,  [phone]- Gait training, [phone]- Vasopneumatic device, Patient/Family education, Balance training, Stair training, Joint mobilization, and Cryotherapy  PLAN FOR NEXT SESSION:  Discharge to HEP.   Gregg Smith, PT, DPT, OCS, ATC 04/21/24  4:35 PM       Asking for 12 more visits with start date of 03/15/2024 to 04/25/2024  Referring diagnosis? S03.347 Treatment diagnosis? (if different than referring diagnosis) R26.2, R60.0, M62.81, M25.661, M25.561 What was this (referring dx) caused by? [x]  Surgery []  Fall []  Ongoing issue [x]  Arthritis []  Other: ____________   Laterality: [x]  Rt []  Lt []  Both   Check all possible CPT codes:                   *CHOOSE 10 OR LESS*                                See Planned Interventions listed in the Plan section of the Evaluation

## 2024-04-22 NOTE — Telephone Encounter (Signed)
 FYI

## 2024-04-23 LAB — URINALYSIS
Bilirubin Urine: NEGATIVE
Glucose, UA: NEGATIVE
Hgb urine dipstick: NEGATIVE
Ketones, ur: NEGATIVE
Leukocytes,Ua: NEGATIVE
Nitrite: NEGATIVE
Protein, ur: NEGATIVE
Specific Gravity, Urine: 1.006 (ref 1.001–1.035)
pH: 7 (ref 5.0–8.0)

## 2024-04-23 LAB — ANTI-SCLERODERMA ANTIBODY: Scleroderma (Scl-70) (ENA) Antibody, IgG: <1 AI

## 2024-04-23 LAB — PROTEIN / CREATININE RATIO, URINE
Creatinine, Urine: 25 mg/dL (ref 20–320)
Protein/Creat Ratio: 120 mg/g{creat} (ref 25–148)
Protein/Creatinine Ratio: 0.12 mg/mg{creat} (ref 0.025–0.148)
Total Protein, Urine: 3 mg/dL — ABNORMAL LOW (ref 5–25)

## 2024-04-24 ENCOUNTER — Ambulatory Visit: Payer: Self-pay

## 2024-04-25 ENCOUNTER — Ambulatory Visit: Admitting: Emergency Medicine

## 2024-04-25 ENCOUNTER — Encounter: Payer: Self-pay | Admitting: Emergency Medicine

## 2024-04-25 VITALS — BP 140/86 | HR 78 | Temp 97.7°F | Ht 70.0 in | Wt 244.2 lb

## 2024-04-25 DIAGNOSIS — I872 Venous insufficiency (chronic) (peripheral): Secondary | ICD-10-CM

## 2024-04-25 DIAGNOSIS — G8929 Other chronic pain: Secondary | ICD-10-CM | POA: Diagnosis not present

## 2024-04-25 DIAGNOSIS — L539 Erythematous condition, unspecified: Secondary | ICD-10-CM | POA: Insufficient documentation

## 2024-04-25 DIAGNOSIS — I10 Essential (primary) hypertension: Secondary | ICD-10-CM | POA: Diagnosis not present

## 2024-04-25 DIAGNOSIS — M5442 Lumbago with sciatica, left side: Secondary | ICD-10-CM | POA: Diagnosis not present

## 2024-04-25 DIAGNOSIS — J849 Interstitial pulmonary disease, unspecified: Secondary | ICD-10-CM | POA: Diagnosis not present

## 2024-04-25 NOTE — Assessment & Plan Note (Signed)
 Diagnosed with pulmonary fibrosis. Told his pulmonary function was doing well Continues Trelegy daily and albuterol  as needed Scheduled to follow-up with them soon Started on Ofev  150 mg twice a day

## 2024-04-25 NOTE — Progress Notes (Signed)
 Gregg Smith 78 y.o.   Chief Complaint  Patient presents with   Shin    Redness in the left leg( shin area), pt thinks he is having side effect from medication( ofev ), Also a prolong cough( which has gotten better)    HISTORY OF PRESENT ILLNESS: This is a 78 y.o. male complaining of rash to right lower leg since last June soon after undergoing right knee replacement surgery. Patient has history of chronic venous insufficiency Also has history of left-sided sciatica Also has history of pulmonary fibrosis.  Recently seen by rheumatologist. Office visit assessment and plan as follows: Assessment / Plan:     Visit Diagnoses:    Pulmonary fibrosis Positive ANA Patient with new diagnosis of UIP. He is noted to have a positive high-titer ANA, however remainder of antibody panel has returned negative, with patient denying clinical features suspicious for any rheumatologic autoimmune disease.    Patient has negative laboratory findings, and denies any features, of SLE (no objective malar rash, inflammatory arthritis, Raynaud's, alopecia, recurrent cytopenias), rheumatoid arthritis (no evidence of synovitis, negative RF/CCP), Sjogren's disease (no sicca symptoms, negative SSA,SSB), scleroderma (no sclerodactyly, no Raynaud's, negative anti-centromere abs). Will order anti-SCL for completeness. He has no rash, objective muscle weakness, and normal CK levels, also lowering suspicion for an underlying DM/PM.    As discussed with patient, a positive ANA need not represent presence of clinically active systemic autoimmune disease. Can be positive in the normal population, autoimmune thyroid disease (Graves' disease, Hashimoto's thyroiditis etc.), or infection. ANA can be positive in the healthy population at the following rates: ANA 1:40: 20%-30%, ANA 1:80: 10%-15%, ANA 1:160: 5%, ANA 1:320: 3% positive, healthy relative of an SLE patient: 5%-25% positive (usually low titers), and elderly (age >70  years): up to 70% positive at ANA titer 1:40.    Low suspicion that his pulmonary fibrosis is secondary to connective tissue disease. 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 Will defer management of pulmonary fibrosis to pulmonology. Patient does feel he may be experiencing new side-effects of anxiety attacks from his new medication, advised him to reach out to his pulmonologist immediately to discuss this further and if any medication adjustments may be required.     Positive atypical P-ANCA Patient with elevated atypical P-ANCA. As discussed with patient, this is a non-specific finding and is not specific for an inflammatory vasculitic condition. He has negative P-ANCA and C-ANCA, as well as any symptoms concerning for an underlying vasculitis (no petechial rash, no mononeuritis multiplex, no hemoptysis, no recurrent sinusitis or uncontrolled asthma). Will obtian U/A and UPC to rule out any hematuria/proteinuria suggestive underlying renal syndrome (although creatinine wnl).   #Right shin erythema Patient with non-tender erythema of right shin, w/o any edema or drainage. He did have recent right TKA, which raises concerns for possible seeding of an infectious process. He currently has no systemic infectious symptoms, nor is there any warmth or drainage concerning for infection. However, advised patient to reach out to his PCP immediately so that this can be assessed and monitored closely.     HPI   Prior to Admission medications   Medication Sig Start Date End Date Taking? Authorizing Provider  acetaminophen  (TYLENOL ) 500 MG tablet Take 500 mg by mouth every 6 (six) hours as needed for moderate pain (pain score 4-6) (prn for pain).   Yes [provider]  albuterol  (VENTOLIN  HFA) 108 (90 Base) MCG/ACT inhaler Inhale 2 puffs into the  lungs every 6 (six) hours as needed for wheezing or shortness of breath. 12/07/23  Yes Kara Dorn NOVAK, MD   eszopiclone (LUNESTA) 2 MG TABS tablet Take 1 mg by mouth at bedtime. 11/12/22  Yes [provider]  Fluticasone -Umeclidin-Vilant (TRELEGY ELLIPTA ) 100-62.5-25 MCG/ACT AEPB Inhale 1 puff into the lungs daily. 06/04/23  Yes Hason Ofarrell, Emil Schanz, MD  gabapentin  (NEURONTIN ) 300 MG capsule Take 1 capsule (300 mg total) by mouth 2 (two) times daily. 03/14/24  Yes Daymen Hassebrock, Emil Schanz, MD  L-Theanine 200 MG CAPS Take 200 mg by mouth daily.   Yes [provider]  lansoprazole  (PREVACID ) 30 MG capsule TAKE 1 CAPSULE (30 MG TOTAL) BY MOUTH 2 (TWO) TIMES DAILY BEFORE A MEAL. 12/09/23  Yes Abran Norleen SAILOR, MD  Milk Thistle 1000 MG CAPS Take 1,000 mg by mouth daily.   Yes [provider]  Multiple Vitamin (MULTIVITAMIN) tablet Take 1 tablet by mouth daily.   Yes [provider]  Nintedanib (OFEV ) 150 MG CAPS Take 1 capsule (150 mg total) by mouth 2 (two) times daily. 03/22/24  Yes Kara Dorn NOVAK, MD  OVER THE COUNTER MEDICATION Take 1 tablet by mouth daily. Calm Aid   Yes [provider]  rosuvastatin  (CRESTOR ) 10 MG tablet TAKE 1 TABLET BY MOUTH EVERY DAY 07/22/23  Yes Tonja Jezewski, Emil Schanz, MD  valsartan -hydrochlorothiazide  (DIOVAN -HCT) 80-12.5 MG tablet TAKE 1 TABLET BY MOUTH EVERY DAY 08/06/23  Yes Eleisha Branscomb, Emil Schanz, MD  GEMTESA  75 MG TABS TAKE 75 MG BY MOUTH DAILY. 12/10/23   Cathan Gearin Jose, MD  oxyCODONE -acetaminophen  (PERCOCET/ROXICET) 5-325 MG tablet Take 1 tablet by mouth every 6 (six) hours as needed. 02/12/24   Jule Ronal CROME, PA-C  tamsulosin  (FLOMAX ) 0.4 MG CAPS capsule TAKE 1 CAPSULE BY MOUTH DAILY AT NIGHT. Patient not taking: Reported on 04/21/2024 03/05/21   Jaicob Dia Jose, MD    Allergies  Allergen Reactions   Penicillins Hives and Rash    Has patient had a PCN reaction causing immediate rash, facial/tongue/throat swelling, SOB or lightheadedness with hypotension:unsure Has patient had a PCN reaction causing severe rash involving  mucus membranes or skin necrosis:unsure Has patient had a PCN reaction that required hospitalization:No Has patient had a PCN reaction occurring within the last 10 years:No If all of the above answers are NO, then may proceed with Cephalosporin use. 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   Chronic pain of right knee 09/21/2023   Centrilobular emphysema (HCC) 06/04/2023   Interstitial lung disease (HCC) 06/04/2023   Chronic cough 02/03/2023   Overactive bladder 09/17/2022   Aortic atherosclerosis (HCC) 03/17/2022   Acute insomnia 10/23/2021   Chronic left-sided low back pain with left-sided sciatica 03/05/2021   Unilateral primary osteoarthritis, right knee 01/31/2021   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   Scoliosis deformity of spine 04/22/2019   Generalized anxiety disorder    Panic disorder    Insomnia    Spinal stenosis of lumbar region 11/30/2018   GERD (gastroesophageal reflux disease) 08/14/2017   S/P left TKA 08/11/2016   S/P knee replacement 08/11/2016   OSA (obstructive sleep apnea) 03/22/2014   Snoring 05/09/2013   Essential hypertension 02/11/2013   Dyslipidemia 02/11/2013   Arthritis 02/11/2013    Past Medical History:  Diagnosis Date   Allergy  seasonal    Anxiety    Arthritis    Barrett's esophagus    Cataract    Chicken pox    Colon polyps    hyperplastic   Diverticulosis    Emphysema lung (HCC)    GERD (gastroesophageal reflux disease)    Hemorrhoids    Hyperlipidemia    Hypertension    ILD (interstitial lung disease) (HCC)    12/18/23 Mild basilar fibrotic interstitial lung disease, 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   Insomnia    Leg cramps    Measles    Mumps    Obstructive sleep apnea    Pneumonia    Pulmonary  fibrosis (HCC)    Sleep apnea    dental device; no longer uses    Past Surgical History:  Procedure Laterality Date   APPENDECTOMY  1957   CATARACT EXTRACTION     COLONOSCOPY     JOINT REPLACEMENT     TONSILLECTOMY     TONSILLECTOMY AND ADENOIDECTOMY  1954   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   Procedure: LEFT TOTAL KNEE ARTHROPLASTY;  Surgeon: Donnice Car, MD;  Location: [facility];  Service: Orthopedics;  Laterality: Left;   TOTAL KNEE ARTHROPLASTY Right 02/03/2024   Procedure: ARTHROPLASTY, KNEE, TOTAL;  Surgeon: Harden Jerona GAILS, MD;  Location: [facility] OR;  Service: Orthopedics;  Laterality: Right;   UPPER GASTROINTESTINAL ENDOSCOPY      Social History   Socioeconomic History   Marital status: Widowed    Spouse name: [name]   Number of children: 1   Years of education: PHD   Highest education level: Doctorate  Occupational History   Occupation: Retired    Comment: Loss adjuster, chartered: [employer]  Tobacco Use   Smoking status: Former    Current packs/day: 0.00    Average packs/day: 1 pack/day for 40.0 years (40.0 ttl pk-yrs)    Types: Cigarettes    Start date: 01/19/1967    Quit date: 01/19/2007    Years since quitting: 17.2    Passive exposure: Never   Smokeless tobacco: Never  Vaping Use   Vaping status: Never Used  Substance and Sexual Activity   Alcohol use: Yes    Alcohol/week: 3.0 standard drinks of alcohol    Types: 3 Cans of beer per week   Drug use: No   Sexual activity: Not Currently  Other Topics Concern   Not on file  Social History Narrative   Patient lives alone in his home   Patient is retired   Patient has a Ph.D   Patient has one adult child.   Patient is right-handed.   Patient drinks 1 cup of tea and 1 cup of coffee daily    Social Drivers of Health   Financial Resource Strain: Low Risk  (03/17/2024)   Overall Financial Resource Strain (CARDIA)    Difficulty of Paying Living Expenses: Not hard at all  Food  Insecurity: No Food Insecurity (03/17/2024)   Hunger Vital Sign    Worried About Running Out of Food in the Last Year: Never true    Ran Out of Food in the Last Year: Never true  Transportation Needs: No Transportation Needs (03/17/2024)   PRAPARE - Administrator, Civil Service (Medical): No    Lack of Transportation (Non-Medical): No  Physical Activity: Sufficiently Active (03/17/2024)   Exercise Vital Sign    Days of Exercise per Week: 3 days    Minutes of  Exercise per Session: 60 min  Stress: No Stress Concern Present (03/17/2024)   [employer] of Occupational Health - Occupational Stress Questionnaire    Feeling of Stress: Only a little  Social Connections: Moderately Integrated (03/17/2024)   Social Connection and Isolation Panel    Frequency of Communication with Friends and Family: More than three times a week    Frequency of Social Gatherings with Friends and Family: Three times a week    Attends Religious Services: More than 4 times per year    Active Member of Clubs or Organizations: Yes    Attends Banker Meetings: More than 4 times per year    Marital Status: Widowed  Intimate Partner Violence: Not At Risk (03/17/2024)   Humiliation, Afraid, Rape, and Kick questionnaire    Fear of Current or Ex-Partner: No    Emotionally Abused: No    Physically Abused: No    Sexually Abused: No    Family History  Problem Relation Age of Onset   Alzheimer's disease Mother    Cancer Father    Stomach cancer Father    Aortic aneurysm Son    Heart disease Son    Cancer Maternal Grandmother    Heart disease Maternal Grandfather    Stomach cancer Paternal Grandfather    Colon cancer Neg Hx    Colon polyps Neg Hx    Esophageal cancer Neg Hx    Rectal cancer Neg Hx    Pancreatic cancer Neg Hx    Liver cancer Neg Hx      Review of Systems  Constitutional: Negative.  Negative for chills and fever.  HENT: Negative.  Negative for congestion and sore  throat.   Respiratory:  Positive for cough. Negative for hemoptysis and sputum production.   Cardiovascular: Negative.  Negative for chest pain and palpitations.  Gastrointestinal:  Negative for abdominal pain, nausea and vomiting.  Musculoskeletal:  Positive for back pain.  Skin:  Positive for rash.  Neurological: Negative.  Negative for dizziness and headaches.  All other systems reviewed and are negative.   Vitals:   04/25/24 1100  BP: (!) 140/86  Pulse: 78  Temp: 97.7 F (36.5 C)  SpO2: 98%    Physical Exam Vitals reviewed.  Constitutional:      Appearance: Normal appearance.  HENT:     Head: Normocephalic.  Eyes:     Extraocular Movements: Extraocular movements intact.  Cardiovascular:     Rate and Rhythm: Normal rate and regular rhythm.     Pulses: Normal pulses.     Heart sounds: Normal heart sounds.  Pulmonary:     Effort: Pulmonary effort is normal.     Breath sounds: Normal breath sounds.  Musculoskeletal:     Comments: Right lower extremity: Surgical incision on right knee healing well.  No signs of infection Patch of erythema seen on lower extremity.  No signs of cellulitis.  Signs of chronic venous insufficiency noted.  Skin:    General: Skin is warm and dry.     Capillary Refill: Capillary refill takes less than 2 seconds.  Neurological:     General: No focal deficit present.     Mental Status: He is alert and oriented to person, place, and time.  Psychiatric:        Mood and Affect: Mood normal.        Behavior: Behavior normal.      ASSESSMENT & PLAN: Problem List Items Addressed This Visit       Cardiovascular  and Mediastinum   Essential hypertension   BP Readings from Last 3 Encounters:  04/25/24 (!) 140/86  04/21/24 (!) 152/88  03/14/24 108/68  Normal blood pressure readings at home Continue valsartan  hydrochlorothiazide  80-12.5 mg daily Cardiovascular risks associated with hypertension discussed           Chronic venous  insufficiency   Contributing to skin rash and chronic edema No signs of cellulitis.  No signs of DVT. Stable without complications.        Respiratory   Interstitial lung disease (HCC)   Diagnosed with pulmonary fibrosis. Told his pulmonary function was doing well Continues Trelegy daily and albuterol  as needed Scheduled to follow-up with them soon Started on Ofev  150 mg twice a day        Nervous and Auditory   Chronic left-sided low back pain with left-sided sciatica   Has occasional flareups Taking gabapentin  300 mg twice a day Was seen by orthopedist who is recommending spinal procedure Patient considering minimally invasive surgery        Musculoskeletal and Integument   Erythema of lower extremity - Primary   Multifactorial.  Had bleeding post knee surgery.  Subcutaneous blood traveling to most dependent area.  Also has history of chronic venous insufficiency contributing to skin discoloration.  No signs of cellulitis.  No signs of DVT.      Patient Instructions  Health Maintenance After Age 23 After age 65, you are at a higher risk for certain long-term diseases and infections as well as injuries from falls. Falls are a major cause of broken bones and head injuries in people who are older than age 31. Getting regular preventive care can help to keep you healthy and well. Preventive care includes getting regular testing and making lifestyle changes as recommended by your health care provider. Talk with your health care provider about: Which screenings and tests you should have. A screening is a test that checks for a disease when you have no symptoms. A diet and exercise plan that is right for you. What should I know about screenings and tests to prevent falls? Screening and testing are the best ways to find a health problem early. Early diagnosis and treatment give you the best chance of managing medical conditions that are common after age 77. Certain conditions and  lifestyle choices may make you more likely to have a fall. Your health care provider may recommend: Regular vision checks. Poor vision and conditions such as cataracts can make you more likely to have a fall. If you wear glasses, make sure to get your prescription updated if your vision changes. Medicine review. Work with your health care provider to regularly review all of the medicines you are taking, including over-the-counter medicines. Ask your health care provider about any side effects that may make you more likely to have a fall. Tell your health care provider if any medicines that you take make you feel dizzy or sleepy. Strength and balance checks. Your health care provider may recommend certain tests to check your strength and balance while standing, walking, or changing positions. Foot health exam. Foot pain and numbness, as well as not wearing proper footwear, can make you more likely to have a fall. Screenings, including: Osteoporosis screening. Osteoporosis is a condition that causes the bones to get weaker and break more easily. Blood pressure screening. Blood pressure changes and medicines to control blood pressure can make you feel dizzy. Depression screening. You may be more likely to have a fall  if you have a fear of falling, feel depressed, or feel unable to do activities that you used to do. Alcohol use screening. Using too much alcohol can affect your balance and may make you more likely to have a fall. Follow these instructions at home: Lifestyle Do not drink alcohol if: Your health care provider tells you not to drink. If you drink alcohol: Limit how much you have to: 0-1 drink a day for women. 0-2 drinks a day for men. Know how much alcohol is in your drink. In the [place] one drink equals one 12 oz bottle of beer (355 mL), one 5 oz glass of wine (148 mL), or one 1 oz glass of hard liquor (44 mL). Do not use any products that contain nicotine or tobacco. These products  include cigarettes, chewing tobacco, and vaping devices, such as e-cigarettes. If you need help quitting, ask your health care provider. Activity  Follow a regular exercise program to stay fit. This will help you maintain your balance. Ask your health care provider what types of exercise are appropriate for you. If you need a cane or walker, use it as recommended by your health care provider. Wear supportive shoes that have nonskid soles. Safety  Remove any tripping hazards, such as rugs, cords, and clutter. Install safety equipment such as grab bars in bathrooms and safety rails on stairs. Keep rooms and walkways well-lit. General instructions Talk with your health care provider about your risks for falling. Tell your health care provider if: You fall. Be sure to tell your health care provider about all falls, even ones that seem minor. You feel dizzy, tiredness (fatigue), or off-balance. Take over-the-counter and prescription medicines only as told by your health care provider. These include supplements. Eat a healthy diet and maintain a healthy weight. A healthy diet includes low-fat dairy products, low-fat (lean) meats, and fiber from whole grains, beans, and lots of fruits and vegetables. Stay current with your vaccines. Schedule regular health, dental, and eye exams. Summary Having a healthy lifestyle and getting preventive care can help to protect your health and wellness after age 76. Screening and testing are the best way to find a health problem early and help you avoid having a fall. Early diagnosis and treatment give you the best chance for managing medical conditions that are more common for people who are older than age 26. Falls are a major cause of broken bones and head injuries in people who are older than age 37. Take precautions to prevent a fall at home. Work with your health care provider to learn what changes you can make to improve your health and wellness and to prevent  falls. This information is not intended to replace advice given to you by your health care provider. Make sure you discuss any questions you have with your health care provider. Document Revised: 01/07/2021 Document Reviewed: 01/07/2021 Elsevier Patient Education  2024 Elsevier Inc.    Emil Schaumann, MD  Primary Care at [facility]

## 2024-04-25 NOTE — Assessment & Plan Note (Signed)
 BP Readings from Last 3 Encounters:  04/25/24 (!) 140/86  04/21/24 (!) 152/88  03/14/24 108/68  Normal blood pressure readings at home Continue valsartan  hydrochlorothiazide  80-12.5 mg daily Cardiovascular risks associated with hypertension discussed

## 2024-04-25 NOTE — Assessment & Plan Note (Signed)
 Multifactorial.  Had bleeding post knee surgery.  Subcutaneous blood traveling to most dependent area.  Also has history of chronic venous insufficiency contributing to skin discoloration.  No signs of cellulitis.  No signs of DVT.

## 2024-04-25 NOTE — Assessment & Plan Note (Signed)
 Contributing to skin rash and chronic edema No signs of cellulitis.  No signs of DVT. Stable without complications.

## 2024-04-25 NOTE — Patient Instructions (Signed)
 Health Maintenance After Age 78 After age 27, you are at a higher risk for certain long-term diseases and infections as well as injuries from falls. Falls are a major cause of broken bones and head injuries in people who are older than age 73. Getting regular preventive care can help to keep you healthy and well. Preventive care includes getting regular testing and making lifestyle changes as recommended by your health care provider. Talk with your health care provider about: Which screenings and tests you should have. A screening is a test that checks for a disease when you have no symptoms. A diet and exercise plan that is right for you. What should I know about screenings and tests to prevent falls? Screening and testing are the best ways to find a health problem early. Early diagnosis and treatment give you the best chance of managing medical conditions that are common after age 90. Certain conditions and lifestyle choices may make you more likely to have a fall. Your health care provider may recommend: Regular vision checks. Poor vision and conditions such as cataracts can make you more likely to have a fall. If you wear glasses, make sure to get your prescription updated if your vision changes. Medicine review. Work with your health care provider to regularly review all of the medicines you are taking, including over-the-counter medicines. Ask your health care provider about any side effects that may make you more likely to have a fall. Tell your health care provider if any medicines that you take make you feel dizzy or sleepy. Strength and balance checks. Your health care provider may recommend certain tests to check your strength and balance while standing, walking, or changing positions. Foot health exam. Foot pain and numbness, as well as not wearing proper footwear, can make you more likely to have a fall. Screenings, including: Osteoporosis screening. Osteoporosis is a condition that causes  the bones to get weaker and break more easily. Blood pressure screening. Blood pressure changes and medicines to control blood pressure can make you feel dizzy. Depression screening. You may be more likely to have a fall if you have a fear of falling, feel depressed, or feel unable to do activities that you used to do. Alcohol  use screening. Using too much alcohol  can affect your balance and may make you more likely to have a fall. Follow these instructions at home: Lifestyle Do not drink alcohol  if: Your health care provider tells you not to drink. If you drink alcohol : Limit how much you have to: 0-1 drink a day for women. 0-2 drinks a day for men. Know how much alcohol  is in your drink. In the [place] one drink equals one 12 oz bottle of beer (355 mL), one 5 oz glass of wine (148 mL), or one 1 oz glass of hard liquor (44 mL). Do not use any products that contain nicotine or tobacco. These products include cigarettes, chewing tobacco, and vaping devices, such as e-cigarettes. If you need help quitting, ask your health care provider. Activity  Follow a regular exercise program to stay fit. This will help you maintain your balance. Ask your health care provider what types of exercise are appropriate for you. If you need a cane or walker, use it as recommended by your health care provider. Wear supportive shoes that have nonskid soles. Safety  Remove any tripping hazards, such as rugs, cords, and clutter. Install safety equipment such as grab bars in bathrooms and safety rails on stairs. Keep rooms and walkways  well-lit. General instructions Talk with your health care provider about your risks for falling. Tell your health care provider if: You fall. Be sure to tell your health care provider about all falls, even ones that seem minor. You feel dizzy, tiredness (fatigue), or off-balance. Take over-the-counter and prescription medicines only as told by your health care provider. These include  supplements. Eat a healthy diet and maintain a healthy weight. A healthy diet includes low-fat dairy products, low-fat (lean) meats, and fiber from whole grains, beans, and lots of fruits and vegetables. Stay current with your vaccines. Schedule regular health, dental, and eye exams. Summary Having a healthy lifestyle and getting preventive care can help to protect your health and wellness after age 15. Screening and testing are the best way to find a health problem early and help you avoid having a fall. Early diagnosis and treatment give you the best chance for managing medical conditions that are more common for people who are older than age 42. Falls are a major cause of broken bones and head injuries in people who are older than age 64. Take precautions to prevent a fall at home. Work with your health care provider to learn what changes you can make to improve your health and wellness and to prevent falls. This information is not intended to replace advice given to you by your health care provider. Make sure you discuss any questions you have with your health care provider. Document Revised: 01/07/2021 Document Reviewed: 01/07/2021 Elsevier Patient Education  2024 [employer].

## 2024-04-25 NOTE — Assessment & Plan Note (Signed)
 Has occasional flareups Taking gabapentin  300 mg twice a day Was seen by orthopedist who is recommending spinal procedure Patient considering minimally invasive surgery

## 2024-04-26 ENCOUNTER — Other Ambulatory Visit (HOSPITAL_COMMUNITY): Payer: Self-pay

## 2024-05-04 ENCOUNTER — Ambulatory Visit (HOSPITAL_COMMUNITY)
Admission: EM | Admit: 2024-05-04 | Discharge: 2024-05-04 | Disposition: A | Discharge status: Home / Self Care | Attending: Emergency Medicine | Admitting: Emergency Medicine

## 2024-05-04 ENCOUNTER — Encounter (HOSPITAL_COMMUNITY): Payer: Self-pay

## 2024-05-04 ENCOUNTER — Telehealth: Payer: Self-pay

## 2024-05-04 DIAGNOSIS — J8410 Pulmonary fibrosis, unspecified: Secondary | ICD-10-CM

## 2024-05-04 DIAGNOSIS — U071 COVID-19: Secondary | ICD-10-CM | POA: Diagnosis not present

## 2024-05-04 MED ORDER — NINTEDANIB ESYLATE 100 MG PO CAPS: 100.0000 mg | ORAL_CAPSULE | Freq: Two times a day (BID) | ORAL | 2 refills | Status: DC

## 2024-05-04 MED ORDER — PAXLOVID (300/100) 20 X 150 MG & 10 X 100MG PO TBPK: 3.0000 | ORAL_TABLET | Freq: Two times a day (BID) | ORAL | 0 refills | Status: AC

## 2024-05-04 NOTE — Discharge Instructions (Signed)
 Start taking Paxlovid  over the next 5 days as instructed to on the package. Hold your rosuvastatin  and Lunesta while taking this. You can also take 1 dose of Ofev  daily versus 2 as discussed to lower  risk for interaction. You can take Tylenol  every 6-8 hours as needed for fever, body aches, sore throat, or any pain You can take over-the-counter Coricidin and as needed for cough and congestion as well. Follow-up with your primary care provider or return here as needed. If you develop severe trouble breathing, chest pain, fevers unrelieved by medication, or severe weakness please seek immediate medical treatment in the emergency department.

## 2024-05-04 NOTE — ED Provider Notes (Signed)
 [facility]    CSN: [id number] Arrival date & time: 05/04/24  1114      History   Chief Complaint Chief Complaint  Patient presents with   Covid Positive         HPI Gregg Smith is a 78 y.o. male.   Patient presents after testing positive for COVID with a home test this morning.  Patient states that yesterday he began to have congestion, fatigue, and watery eyes and decided to take a test this morning which was positive.  Patient denies taking any medication for his symptoms.  Patient denies any chest pain, shortness of breath, nausea, vomiting, diarrhea, and abdominal pain.  Significant past medical history includes emphysema, interstitial lung disease, pulmonary fibrosis, hypertension, hyperlipidemia, obstructive sleep apnea, and GERD.  Patient does take rosuvastatin  and Lunesta which are contraindicated with Paxlovid .  Patient also takes Ofev , but states that he did speak with his doctor and they stated that he could take one less dose a day to lessen his risk for interaction with this.  The history is provided by the patient and medical records.    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   Chronic pain of right knee 09/21/2023   Centrilobular emphysema (HCC) 06/04/2023   Interstitial lung disease (HCC) 06/04/2023   Chronic cough 02/03/2023   Overactive bladder 09/17/2022   Aortic atherosclerosis (HCC) 03/17/2022   Acute insomnia 10/23/2021   Chronic left-sided low back pain with left-sided sciatica 03/05/2021   Unilateral primary osteoarthritis, right knee 01/31/2021   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   Scoliosis deformity of spine 04/22/2019   Generalized anxiety disorder    Panic disorder    Insomnia    Spinal stenosis of lumbar region 11/30/2018   GERD (gastroesophageal reflux disease) 08/14/2017   S/P left TKA 08/11/2016   S/P knee replacement 08/11/2016   OSA (obstructive sleep apnea) 03/22/2014   Snoring 05/09/2013   Essential hypertension 02/11/2013   Dyslipidemia 02/11/2013   Arthritis 02/11/2013    Past Surgical History:  Procedure Laterality Date   APPENDECTOMY  1957   CATARACT EXTRACTION     COLONOSCOPY     JOINT REPLACEMENT     TONSILLECTOMY     TONSILLECTOMY AND ADENOIDECTOMY  1954   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   Procedure: LEFT TOTAL KNEE ARTHROPLASTY;  Surgeon: Donnice Car, MD;  Location: [facility];  Service: Orthopedics;  Laterality: Left;  TOTAL KNEE ARTHROPLASTY Right 02/03/2024   Procedure: ARTHROPLASTY, KNEE, TOTAL;  Surgeon: Harden Jerona GAILS, MD;  Location: [facility] OR;  Service: Orthopedics;  Laterality: Right;   UPPER GASTROINTESTINAL ENDOSCOPY         Home Medications    Prior to Admission medications   Medication Sig Start Date End Date Taking? Authorizing Provider  nirmatrelvir/ritonavir (PAXLOVID , 300/100,) 20 x 150 MG & 10 x 100MG  TBPK Take 3 tablets by mouth 2 (two) times daily for 5 days. Patient GFR is 77. Take nirmatrelvir (150 mg) two tablets twice daily for 5 days and ritonavir (100 mg) one tablet twice daily for 5 days.  05/04/24 05/09/24 Yes Johnie, Jaeson Molstad A, NP  acetaminophen  (TYLENOL ) 500 MG tablet Take 500 mg by mouth every 6 (six) hours as needed for moderate pain (pain score 4-6) (prn for pain).    [provider]  albuterol  (VENTOLIN  HFA) 108 (90 Base) MCG/ACT inhaler Inhale 2 puffs into the lungs every 6 (six) hours as needed for wheezing or shortness of breath. 12/07/23   Kara Dorn NOVAK, MD  eszopiclone (LUNESTA) 2 MG TABS tablet Take 1 mg by mouth at bedtime. 11/12/22   [provider]  Fluticasone -Umeclidin-Vilant (TRELEGY ELLIPTA ) 100-62.5-25 MCG/ACT AEPB Inhale 1 puff into the lungs daily. 06/04/23   Purcell Emil Schanz, MD  gabapentin  (NEURONTIN ) 300 MG capsule Take 1 capsule (300 mg total) by mouth 2 (two) times daily. 03/14/24   Purcell Emil Schanz, MD  L-Theanine 200 MG CAPS Take 200 mg by mouth daily.    [provider]  lansoprazole  (PREVACID ) 30 MG capsule TAKE 1 CAPSULE (30 MG TOTAL) BY MOUTH 2 (TWO) TIMES DAILY BEFORE A MEAL. 12/09/23   Abran Norleen SAILOR, MD  Milk Thistle 1000 MG CAPS Take 1,000 mg by mouth daily.    [provider]  Multiple Vitamin (MULTIVITAMIN) tablet Take 1 tablet by mouth daily.    [provider]  Nintedanib  (OFEV ) 150 MG CAPS Take 1 capsule (150 mg total) by mouth 2 (two) times daily. 03/22/24   Kara Dorn NOVAK, MD  OVER THE COUNTER MEDICATION Take 1 tablet by mouth daily. Calm Aid    [provider]  rosuvastatin  (CRESTOR ) 10 MG tablet TAKE 1 TABLET BY MOUTH EVERY DAY 07/22/23   Purcell Emil Schanz, MD  valsartan -hydrochlorothiazide  (DIOVAN -HCT) 80-12.5 MG tablet TAKE 1 TABLET BY MOUTH EVERY DAY 08/06/23   Purcell Emil Schanz, MD    Family History Family History  Problem Relation Age of Onset   Alzheimer's disease Mother    Cancer Father    Stomach cancer Father    Aortic aneurysm Son    Heart disease Son    Cancer Maternal Grandmother    Heart disease Maternal Grandfather    Stomach cancer Paternal  Grandfather    Colon cancer Neg Hx    Colon polyps Neg Hx    Esophageal cancer Neg Hx    Rectal cancer Neg Hx    Pancreatic cancer Neg Hx    Liver cancer Neg Hx     Social History Social History   Tobacco Use   Smoking status: Former    Current packs/day: 0.00    Average packs/day: 1 pack/day for 40.0 years (40.0 ttl pk-yrs)    Types: Cigarettes    Start date: 01/19/1967    Quit date: 01/19/2007    Years since quitting: 17.3    Passive exposure: Never   Smokeless tobacco: Never  Vaping Use   Vaping status: Never Used  Substance Use Topics   Alcohol use: Yes    Alcohol/week: 3.0 standard drinks of alcohol    Types: 3 Cans of beer per week   Drug use: No     Allergies   Penicillins   Review of Systems Review of Systems  Per HPI  Physical Exam Triage Vital Signs ED Triage Vitals  Encounter Vitals Group     BP 05/04/24 1258 (!) 146/83     Girls Systolic BP Percentile --      Girls Diastolic BP Percentile --      Boys Systolic BP Percentile --      Boys Diastolic BP Percentile --      Pulse Rate 05/04/24 1258 73     Resp 05/04/24 1258 18     Temp 05/04/24 1258 98.6 F (37 C)     Temp Source 05/04/24 1258 Oral     SpO2 05/04/24 1258 94 %     Weight --      Height --      Head Circumference --      Peak Flow --      Pain Score 05/04/24 1259 0     Pain Loc --      Pain Education --      Exclude from Growth Chart --    No data found.  Updated Vital Signs BP (!) 146/83 (BP Location: Right Arm)   Pulse 73   Temp 98.6 F (37 C) (Oral)   Resp 18   SpO2 94%   Visual Acuity Right Eye Distance:   Left Eye Distance:   Bilateral Distance:    Right Eye Near:   Left Eye Near:    Bilateral Near:     Physical Exam Vitals and nursing note reviewed.  Constitutional:      General: He is awake. He is not in acute distress.    Appearance: Normal appearance. He is well-developed and well-groomed. He is not ill-appearing.  HENT:     Right Ear: Tympanic  membrane, ear canal and external ear normal.     Left Ear: Tympanic membrane, ear canal and external ear normal.     Nose: Congestion and rhinorrhea present.     Mouth/Throat:     Mouth: Mucous membranes are moist.     Pharynx: Posterior oropharyngeal erythema and postnasal drip present. No oropharyngeal exudate.  Cardiovascular:     Rate and Rhythm: Normal rate and regular rhythm.  Pulmonary:     Effort: Pulmonary effort is normal.     Breath sounds: Normal breath sounds.  Skin:    General: Skin is warm and dry.  Neurological:     Mental Status: He is alert.  Psychiatric:        Behavior: Behavior is cooperative.      UC Treatments / Results  Labs (all labs ordered are listed, but only abnormal results are displayed) Labs Reviewed - No data to display  EKG   Radiology No results found.  Procedures Procedures (including critical care time)  Medications Ordered in UC Medications - No data to display  Initial Impression / Assessment and Plan / UC Course  I have reviewed the triage vital signs and the nursing notes.  Pertinent labs & imaging results that were available during my care of the patient were reviewed by me and considered in my medical decision making (see chart for details).     Patient is overall well-appearing.  Vitals are stable.  Congestion and rhinorrhea are present, erythema and  PND noted to posterior oropharynx.  Lungs clear bilaterally to auscultation.  Patient had positive at-home COVID test.  Deferred retesting for this.  GFR obtained 03/2024 was 77.  Paxlovid  prescribed.  Discussed with patient which medications to pause while taking this.  Discussed over-the-counter medications as needed for symptoms.  Discussed follow-up, return, and strict ER precautions. Final Clinical Impressions(s) / UC Diagnoses   Final diagnoses:  COVID-19  Positive self-administered antigen test for COVID-19     Discharge Instructions      Start taking Paxlovid   over the next 5 days as instructed to on the package. Hold your rosuvastatin  and Lunesta while taking this. You can also take 1 dose of Ofev  daily versus 2 as discussed to lower  risk for interaction. You can take Tylenol  every 6-8 hours as needed for fever, body aches, sore throat, or any pain You can take over-the-counter Coricidin and as needed for cough and congestion as well. Follow-up with your primary care provider or return here as needed. If you develop severe trouble breathing, chest pain, fevers unrelieved by medication, or severe weakness please seek immediate medical treatment in the emergency department.   ED Prescriptions     Medication Sig Dispense Auth. Provider   nirmatrelvir/ritonavir (PAXLOVID , 300/100,) 20 x 150 MG & 10 x 100MG  TBPK Take 3 tablets by mouth 2 (two) times daily for 5 days. Patient GFR is 77. Take nirmatrelvir (150 mg) two tablets twice daily for 5 days and ritonavir (100 mg) one tablet twice daily for 5 days. 30 tablet Johnie Flaming A, NP      PDMP not reviewed this encounter.   Johnie Flaming A, NP 05/04/24 1335

## 2024-05-04 NOTE — Telephone Encounter (Signed)
 Noted, thanks!

## 2024-05-04 NOTE — ED Triage Notes (Signed)
 Pt c/o congestion, fatigue, and watery eyes since last night. States had a positive home covid test. Denies taken any meds for sx's.

## 2024-05-04 NOTE — Telephone Encounter (Signed)
 Copied from CRM [id number]. Topic: Clinical - Medical Advice >> May 04, 2024  9:19 AM [name] wrote: Reason for CRM: patient has tested positive for covid and wants to know if medicatin can be sent to pharmacy

## 2024-05-04 NOTE — Telephone Encounter (Signed)
 Contacted patient to discuss interaction between Ofev  and Paxlovid . He read about the interaction online and discussed with [facility] pharmacy. He was told he can take Ofev  once daily while on Paxlovid . He calls to clarify.  The interaction is not rated as severe, but the interaction increases exposure to Ofev  and may therefore increase side effects, depending on how he currently tolerates Ofev .   Patient brings forward the following additional concern: difficulty adjusting to Ofev  therapy due to side effects. He notes the following: brain fog, feels he can't control his thoughts (denies SI/HI). Side effects improve with hydration. Denies diarrhea. Due to feeling brain fog, he reports it has been an unpleasant month on Ofev . He requests to DECREASE Ofev  dose to 100mg  q12h.   He prefers to reconsider 150mg  q12h dose in the future after he adjusts to Ofev . Will enter Rx for Ofev  100mg  q12h, with plan to re-evaluate tolerability at his next visit with Dr. Kara this month on 05/30/24.   In light of his desire to decrease dose of Ofev  d/t side effects, we discussed that he may notice worsened side effects of Ofev  while on Paxlovid . It is okay to hold Ofev  for 5 days while taking Paxlovid , then restart. He is hesitant to stop Ofev  and decides he would like to continue taking Ofev , but take once daily while on Paxlovid  and administer at a different time of day than when he takes Paxlovid .  Encouraged to contact team if he has any questions.  Patient verbalizes understanding and agreement with plan.  Gregg Smith, PharmD, BCPS Clinical Pharmacist  [facility]

## 2024-05-05 ENCOUNTER — Encounter: Payer: Self-pay | Admitting: Emergency Medicine

## 2024-05-05 ENCOUNTER — Telehealth: Admitting: Emergency Medicine

## 2024-05-05 DIAGNOSIS — U071 COVID-19: Secondary | ICD-10-CM

## 2024-05-05 DIAGNOSIS — R6889 Other general symptoms and signs: Secondary | ICD-10-CM | POA: Insufficient documentation

## 2024-05-05 NOTE — Assessment & Plan Note (Signed)
 Diet and nutrition discussed Advised to rest and stay well-hydrated

## 2024-05-05 NOTE — Assessment & Plan Note (Signed)
 Clinically stable without complications Running its course.  No red flag signs or symptoms. Symptom management discussed Day 1 yesterday.  Started on Paxlovid  Advised to rest and stay well-hydrated ED precautions given COVID instructions given Advised to contact the office if no better or worse during the next several days.

## 2024-05-05 NOTE — Assessment & Plan Note (Signed)
 Symptom management discussed Advised to rest and stay well-hydrated Tylenol  and or Advil for pain and headaches as needed Continue and finish Paxlovid  Advised to contact the office if no better or worse during the next several days.

## 2024-05-05 NOTE — Progress Notes (Signed)
 Telemedicine Encounter- SOAP NOTE Established Patient MyChart video encounter Patient: Home  Provider: Office   Patient present only  This video encounter was conducted with the patient's (or proxy's) verbal consent via video telecommunications: yes/no: Yes Patient was instructed to have this encounter in a suitably private space; and to only have persons present to whom they give permission to participate. In addition, patient identity was confirmed by use of name plus two identifiers (DOB and address).  Chief Complaint  Patient presents with   Covid Positive    Patient states feeling awful, fatigue, congestion, sneezing. He states he went to UC yesterday and was given paxlovid . He did an at home covid test and it was positive.    Subjective  Gregg Smith is a 78 y.o. established patient.  Visit today complaining of flulike symptoms that started yesterday.  Went to urgent care center and was diagnosed with COVID infection.  Was started on Paxlovid .  Just wanted to follow-up with me.  Mostly complaining of feeling tired and fatigued with sneezing and congestion.  Denies chest pain or difficulty breathing. Able to eat and drink.  Denies nausea or vomiting.  Denies abdominal pain or diarrhea. No other associated symptoms No other complaints or medical concerns today. HPI ? Patient Active Problem List   Diagnosis Date Noted   Erythema of lower extremity 04/25/2024   Chronic venous insufficiency 04/25/2024   Osteoarthritis (arthritis due to wear and tear of joints) 02/03/2024   Osteoarthritis of lumbar spine with myelopathy 11/03/2023   Chronic pain of right knee 09/21/2023   Centrilobular emphysema (HCC) 06/04/2023   Interstitial lung disease (HCC) 06/04/2023   Chronic cough 02/03/2023   Overactive bladder 09/17/2022   Aortic atherosclerosis (HCC) 03/17/2022   Acute insomnia 10/23/2021   Chronic left-sided low back pain with left-sided sciatica 03/05/2021   Unilateral primary  osteoarthritis, right knee 01/31/2021   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   Scoliosis deformity of spine 04/22/2019   Generalized anxiety disorder    Panic disorder    Insomnia    Spinal stenosis of lumbar region 11/30/2018   GERD (gastroesophageal reflux disease) 08/14/2017   S/P left TKA 08/11/2016   S/P knee replacement 08/11/2016   OSA (obstructive sleep apnea) 03/22/2014   Snoring 05/09/2013   Essential hypertension 02/11/2013   Dyslipidemia 02/11/2013   Arthritis 02/11/2013   Past Medical History:  Diagnosis Date   Allergy    seasonal    Anxiety    Arthritis    Barrett's esophagus    Cataract    Chicken pox    Colon polyps    hyperplastic   Diverticulosis    Emphysema lung (HCC)    GERD (gastroesophageal reflux disease)    Hemorrhoids    Hyperlipidemia    Hypertension    ILD (interstitial lung disease) (HCC)    12/18/23 Mild basilar fibrotic interstitial lung disease, 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   Insomnia    Leg cramps    Measles    Mumps    Obstructive sleep apnea    Pneumonia    Pulmonary fibrosis (HCC)    Sleep apnea    dental device; no longer uses   Current Outpatient Medications  Medication Sig Dispense Refill   acetaminophen  (TYLENOL ) 500 MG tablet Take 500 mg by mouth every 6 (six) hours as needed for moderate pain (pain score 4-6) (prn for pain).     albuterol  (VENTOLIN  HFA) 108 (90 Base) MCG/ACT inhaler Inhale 2 puffs into the  lungs every 6 (six) hours as needed for wheezing or shortness of breath. 8 g 6   eszopiclone (LUNESTA) 2 MG TABS tablet Take 1 mg by mouth at bedtime.     Fluticasone -Umeclidin-Vilant (TRELEGY ELLIPTA ) 100-62.5-25 MCG/ACT AEPB Inhale 1 puff into the lungs daily. 60 each 11   gabapentin  (NEURONTIN ) 300 MG capsule Take 1 capsule (300 mg total) by mouth 2 (two) times daily. 180 capsule 1   L-Theanine 200 MG CAPS Take 200 mg by mouth daily.     lansoprazole  (PREVACID ) 30 MG capsule TAKE 1  CAPSULE (30 MG TOTAL) BY MOUTH 2 (TWO) TIMES DAILY BEFORE A MEAL. 180 capsule 1   Milk Thistle 1000 MG CAPS Take 1,000 mg by mouth daily.     Multiple Vitamin (MULTIVITAMIN) tablet Take 1 tablet by mouth daily.     Nintedanib  (OFEV ) 100 MG CAPS Take 1 capsule (100 mg total) by mouth 2 (two) times daily. 60 capsule 2   nirmatrelvir/ritonavir (PAXLOVID , 300/100,) 20 x 150 MG & 10 x 100MG  TBPK Take 3 tablets by mouth 2 (two) times daily for 5 days. Patient GFR is 77. Take nirmatrelvir (150 mg) two tablets twice daily for 5 days and ritonavir (100 mg) one tablet twice daily for 5 days. 30 tablet 0   OVER THE COUNTER MEDICATION Take 1 tablet by mouth daily. Calm Aid     rosuvastatin  (CRESTOR ) 10 MG tablet TAKE 1 TABLET BY MOUTH EVERY DAY 90 tablet 3   valsartan -hydrochlorothiazide  (DIOVAN -HCT) 80-12.5 MG tablet TAKE 1 TABLET BY MOUTH EVERY DAY 90 tablet 3   Current Facility-Administered Medications  Medication Dose Route Frequency Provider Last Rate Last Admin   0.9 %  sodium chloride  infusion  500 mL Intravenous Once Abran Norleen SAILOR, MD       Allergies  Allergen Reactions   Penicillins Hives and Rash    Has patient had a PCN reaction causing immediate rash, facial/tongue/throat swelling, SOB or lightheadedness with hypotension:unsure Has patient had a PCN reaction causing severe rash involving mucus membranes or skin necrosis:unsure Has patient had a PCN reaction that required hospitalization:No Has patient had a PCN reaction occurring within the last 10 years:No If all of the above answers are NO, then may proceed with Cephalosporin use. Childhood reaction    Social History   Socioeconomic History   Marital status: Widowed    Spouse name: [name]   Number of children: 1   Years of education: PHD   Highest education level: Doctorate  Occupational History   Occupation: Retired    Comment: Loss adjuster, chartered: [employer]  Tobacco Use   Smoking status: Former     Current packs/day: 0.00    Average packs/day: 1 pack/day for 40.0 years (40.0 ttl pk-yrs)    Types: Cigarettes    Start date: 01/19/1967    Quit date: 01/19/2007    Years since quitting: 17.3    Passive exposure: Never   Smokeless tobacco: Never  Vaping Use   Vaping status: Never Used  Substance and Sexual Activity   Alcohol use: Yes    Alcohol/week: 3.0 standard drinks of alcohol    Types: 3 Cans of beer per week   Drug use: No   Sexual activity: Not Currently  Other Topics Concern   Not on file  Social History Narrative   Patient lives alone in his home   Patient is retired   Patient has a Ph.D   Patient has one adult  child.   Patient is right-handed.   Patient drinks 1 cup of tea and 1 cup of coffee daily    Social Drivers of Health   Financial Resource Strain: Low Risk  (03/17/2024)   Overall Financial Resource Strain (CARDIA)    Difficulty of Paying Living Expenses: Not hard at all  Food Insecurity: No Food Insecurity (03/17/2024)   Hunger Vital Sign    Worried About Running Out of Food in the Last Year: Never true    Ran Out of Food in the Last Year: Never true  Transportation Needs: No Transportation Needs (03/17/2024)   PRAPARE - Administrator, Civil Service (Medical): No    Lack of Transportation (Non-Medical): No  Physical Activity: Sufficiently Active (03/17/2024)   Exercise Vital Sign    Days of Exercise per Week: 3 days    Minutes of Exercise per Session: 60 min  Stress: No Stress Concern Present (03/17/2024)   [employer] of Occupational Health - Occupational Stress Questionnaire    Feeling of Stress: Only a little  Social Connections: Moderately Integrated (03/17/2024)   Social Connection and Isolation Panel    Frequency of Communication with Friends and Family: More than three times a week    Frequency of Social Gatherings with Friends and Family: Three times a week    Attends Religious Services: More than 4 times per year    Active  Member of Clubs or Organizations: Yes    Attends Banker Meetings: More than 4 times per year    Marital Status: Widowed  Intimate Partner Violence: Not At Risk (03/17/2024)   Humiliation, Afraid, Rape, and Kick questionnaire    Fear of Current or Ex-Partner: No    Emotionally Abused: No    Physically Abused: No    Sexually Abused: No   Review of Systems  Constitutional: Negative.  Negative for chills and fever.  HENT:  Positive for congestion. Negative for sore throat.   Respiratory:  Positive for cough.   Cardiovascular: Negative.  Negative for chest pain and palpitations.  Gastrointestinal:  Negative for abdominal pain, diarrhea, nausea and vomiting.  Musculoskeletal:  Positive for myalgias.  Skin:  Negative for rash.  Neurological:  Positive for headaches.  All other systems reviewed and are negative.  Objective  Vitals as reported by the patient: There were no vitals filed for this visit. Problem List Items Addressed This Visit       Other   COVID-19 virus infection - Primary   Clinically stable without complications Running its course.  No red flag signs or symptoms. Symptom management discussed Day 1 yesterday.  Started on Paxlovid  Advised to rest and stay well-hydrated ED precautions given COVID instructions given Advised to contact the office if no better or worse during the next several days.      Flu-like symptoms   Symptom management discussed Advised to rest and stay well-hydrated Tylenol  and or Advil for pain and headaches as needed Continue and finish Paxlovid  Advised to contact the office if no better or worse during the next several days.      Obesity, morbid (HCC)   Diet and nutrition discussed Advised to rest and stay well-hydrated       I discussed the assessment and treatment plan with the patient. The patient was provided an opportunity to ask questions and all were answered. The patient agreed with the plan and demonstrated an  understanding of the instructions.   The patient was advised to call back or  seek an in-person evaluation if the symptoms worsen or if the condition fails to improve as anticipated.    Dr. Emil Schaumann, MD [facility]

## 2024-05-07 ENCOUNTER — Other Ambulatory Visit: Payer: Self-pay | Admitting: Emergency Medicine

## 2024-05-07 DIAGNOSIS — J432 Centrilobular emphysema: Secondary | ICD-10-CM

## 2024-05-07 DIAGNOSIS — J849 Interstitial pulmonary disease, unspecified: Secondary | ICD-10-CM

## 2024-05-08 ENCOUNTER — Ambulatory Visit
Admission: RE | Admit: 2024-05-08 | Discharge: 2024-05-08 | Disposition: A | Discharge status: Home / Self Care | Source: Ambulatory Visit | Attending: Emergency Medicine | Admitting: Emergency Medicine

## 2024-05-08 VITALS — BP 149/94 | HR 72 | Temp 97.8°F | Resp 19

## 2024-05-08 DIAGNOSIS — U071 COVID-19: Secondary | ICD-10-CM | POA: Diagnosis not present

## 2024-05-08 NOTE — ED Provider Notes (Signed)
 Gregg Smith    CSN: [id number] Arrival date & time: 05/08/24  0900      History   Chief Complaint Chief Complaint  Patient presents with   Insomnia    HPI Gregg Smith is a 78 y.o. male.   HPI 78 year old male presents with worsening sleep.  Reports that he started Paxlovid  on Wednesday and had to stop his Lunesta for sleep aid.  Reports using melatonin for sleep which is ineffective.  Patient reports difficulty sleeping, brain fog, dizziness, and anxiety.  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   Insomnia    Leg cramps    Measles    Mumps    Obstructive sleep apnea    Pneumonia    Pulmonary fibrosis (HCC)    Sleep apnea    dental device; no longer uses    Patient Active Problem List   Diagnosis Date Noted   Flu-like symptoms 05/05/2024   Obesity, morbid (HCC) 05/05/2024   Erythema of lower extremity 04/25/2024   Chronic venous insufficiency 04/25/2024   Osteoarthritis (arthritis due to wear and tear of joints) 02/03/2024   Osteoarthritis of lumbar spine with myelopathy 11/03/2023   Chronic pain of right knee 09/21/2023   Centrilobular emphysema (HCC) 06/04/2023   Interstitial lung disease (HCC) 06/04/2023   Chronic cough 02/03/2023   Overactive bladder 09/17/2022   COVID-19 virus infection 04/01/2022   Aortic atherosclerosis (HCC) 03/17/2022   Acute insomnia 10/23/2021   Chronic left-sided low back pain with left-sided sciatica 03/05/2021   Unilateral primary osteoarthritis, right knee 01/31/2021   Degeneration of lumbar  intervertebral disc 04/22/2019   Scoliosis deformity of spine 04/22/2019   Generalized anxiety disorder    Panic disorder    Insomnia    Spinal stenosis of lumbar region 11/30/2018   GERD (gastroesophageal reflux disease) 08/14/2017   S/P left TKA 08/11/2016   S/P knee replacement 08/11/2016   OSA (obstructive sleep apnea) 03/22/2014   Snoring 05/09/2013   Essential hypertension 02/11/2013   Dyslipidemia 02/11/2013   Arthritis 02/11/2013    Past Surgical History:  Procedure Laterality Date   APPENDECTOMY  1957   CATARACT EXTRACTION     COLONOSCOPY     JOINT REPLACEMENT     TONSILLECTOMY     TONSILLECTOMY AND ADENOIDECTOMY  1954   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   Procedure: LEFT TOTAL KNEE ARTHROPLASTY;  Surgeon: Donnice Car, MD;  Location: [facility];  Service: Orthopedics;  Laterality: Left;   TOTAL KNEE ARTHROPLASTY Right 02/03/2024   Procedure: ARTHROPLASTY, KNEE, TOTAL;  Surgeon: Harden Jerona GAILS, MD;  Location: [facility] OR;  Service: Orthopedics;  Laterality: Right;   UPPER GASTROINTESTINAL ENDOSCOPY         Home Medications    Prior to Admission medications   Medication Sig Start Date End Date Taking? Authorizing Provider  eszopiclone (LUNESTA) 2 MG TABS tablet Take 1 mg by mouth at bedtime. 11/12/22  Yes [provider]  rosuvastatin  (CRESTOR ) 10 MG tablet  TAKE 1 TABLET BY MOUTH EVERY DAY 07/22/23  Yes Sagardia, Emil Schanz, MD  acetaminophen  (TYLENOL ) 500 MG tablet Take 500 mg by mouth every 6 (six) hours as needed for moderate pain (pain score 4-6) (prn for pain).    [provider]  albuterol  (VENTOLIN  HFA) 108 (90 Base) MCG/ACT inhaler Inhale 2 puffs into the lungs every 6 (six) hours as needed for wheezing or shortness of breath. 12/07/23   Kara Dorn NOVAK, MD  gabapentin  (NEURONTIN ) 300 MG capsule Take 1 capsule (300 mg total) by mouth 2 (two) times daily. 03/14/24   Purcell Emil Schanz, MD  L-Theanine 200 MG CAPS Take 200 mg by mouth daily.     [provider]  lansoprazole  (PREVACID ) 30 MG capsule TAKE 1 CAPSULE (30 MG TOTAL) BY MOUTH 2 (TWO) TIMES DAILY BEFORE A MEAL. 12/09/23   Abran Norleen SAILOR, MD  Milk Thistle 1000 MG CAPS Take 1,000 mg by mouth daily.    [provider]  Multiple Vitamin (MULTIVITAMIN) tablet Take 1 tablet by mouth daily.    [provider]  Nintedanib  (OFEV ) 100 MG CAPS Take 1 capsule (100 mg total) by mouth 2 (two) times daily. 05/04/24   Kara Dorn NOVAK, MD  nirmatrelvir/ritonavir (PAXLOVID , 300/100,) 20 x 150 MG & 10 x 100MG  TBPK Take 3 tablets by mouth 2 (two) times daily for 5 days. Patient GFR is 77. Take nirmatrelvir (150 mg) two tablets twice daily for 5 days and ritonavir (100 mg) one tablet twice daily for 5 days. 05/04/24 05/09/24  Johnie Flaming A, NP  OVER THE COUNTER MEDICATION Take 1 tablet by mouth daily. Calm Aid    [provider]  TRELEGY ELLIPTA  100-62.5-25 MCG/ACT AEPB TAKE 1 PUFF BY MOUTH EVERY DAY 05/07/24   Purcell Emil Schanz, MD  valsartan -hydrochlorothiazide  (DIOVAN -HCT) 80-12.5 MG tablet TAKE 1 TABLET BY MOUTH EVERY DAY 08/06/23   Purcell Emil Schanz, MD    Family History Family History  Problem Relation Age of Onset   Alzheimer's disease Mother    Cancer Father    Stomach cancer Father    Aortic aneurysm Son    Heart disease Son    Cancer Maternal Grandmother    Heart disease Maternal Grandfather    Stomach cancer Paternal Grandfather    Colon cancer Neg Hx    Colon polyps Neg Hx    Esophageal cancer Neg Hx    Rectal cancer Neg Hx    Pancreatic cancer Neg Hx    Liver cancer Neg Hx     Social History Social History   Tobacco Use   Smoking status: Former    Current packs/day: 0.00    Average packs/day: 1 pack/day for 40.0 years (40.0 ttl pk-yrs)    Types: Cigarettes    Start date: 01/19/1967    Quit date: 01/19/2007    Years since quitting: 17.3    Passive exposure: Never   Smokeless tobacco: Never  Vaping Use   Vaping status: Never  Used  Substance Use Topics   Alcohol use: Yes    Alcohol/week: 3.0 standard drinks of alcohol    Types: 3 Cans of beer per week   Drug use: No     Allergies   Penicillins   Review of Systems Review of Systems  Neurological:  Negative for dizziness and light-headedness.  Psychiatric/Behavioral:  Positive for sleep disturbance.      Physical Exam Triage Vital Signs ED Triage Vitals [05/08/24 0912]  Encounter Vitals Group     BP  Girls Systolic BP Percentile      Girls Diastolic BP Percentile      Boys Systolic BP Percentile      Boys Diastolic BP Percentile      Pulse      Resp      Temp      Temp src      SpO2      Weight      Height      Head Circumference      Peak Flow      Pain Score 0     Pain Loc      Pain Education      Exclude from Growth Chart    No data found.  Updated Vital Signs BP (!) 149/94   Pulse 72   Temp 97.8 F (36.6 C)   Resp 19   SpO2 98%    Physical Exam Vitals and nursing note reviewed.  Constitutional:      General: He is not in acute distress.    Appearance: Normal appearance. He is obese. He is not ill-appearing.  HENT:     Head: Normocephalic and atraumatic.     Right Ear: Tympanic membrane, ear canal and external ear normal.     Left Ear: Tympanic membrane, ear canal and external ear normal.     Mouth/Throat:     Mouth: Mucous membranes are moist.     Pharynx: Oropharynx is clear.  Eyes:     Extraocular Movements: Extraocular movements intact.     Pupils: Pupils are equal, round, and reactive to light.  Cardiovascular:     Rate and Rhythm: Normal rate and regular rhythm.     Pulses: Normal pulses.     Heart sounds: Normal heart sounds. No murmur heard.    No friction rub. No gallop.  Pulmonary:     Effort: Pulmonary effort is normal.     Breath sounds: Normal breath sounds. No wheezing, rhonchi or rales.  Musculoskeletal:        General: Normal range of motion.  Skin:    General: Skin is warm and dry.   Neurological:     General: No focal deficit present.     Mental Status: He is alert and oriented to person, place, and time. Mental status is at baseline.     Cranial Nerves: No cranial nerve deficit.     Motor: No weakness.     Gait: Gait normal.  Psychiatric:        Mood and Affect: Mood normal.        Behavior: Behavior normal.      UC Treatments / Results  Labs (all labs ordered are listed, but only abnormal results are displayed) Labs Reviewed - No data to display  EKG   Radiology No results found.  Procedures Procedures (including critical Smith time)  Medications Ordered in UC Medications - No data to display  Initial Impression / Assessment and Plan / UC Course  I have reviewed the triage vital signs and the nursing notes.  Pertinent labs & imaging results that were available during my Smith of the patient were reviewed by me and considered in my medical decision making (see chart for details).     MDM: 1. COVID-19-advised patient to continue Paxlovid  as prescribed and may take his Lunesta as well.  Patient reports having only 3 doses left of Paxlovid . Final Clinical Impressions(s) / UC Diagnoses   Final diagnoses:  COVID-19     Discharge Instructions  Advised patient to take Paxlovid  as directed.  Encouraged increase daily water  intake to 64 ounces per day while taking this medication.  Advised if symptoms worsen and/or unresolved please follow-up with your PCP or here for further evaluation     ED Prescriptions   None    PDMP not reviewed this encounter.   Teddy Sharper, FNP 05/08/24 [phone]

## 2024-05-08 NOTE — ED Triage Notes (Signed)
 Pt presents to uc with co worsening of sleep. He started paxlovid  on Wednesday and has had to stop his linesta and use melatonin for sleep aid. Pt reports extrem difficulty sleeping. Brain fog, dizziness, anxiety.

## 2024-05-08 NOTE — Discharge Instructions (Addendum)
 Advised patient to take Paxlovid  as directed.  Encouraged increase daily water  intake to 64 ounces per day while taking this medication.  Advised if symptoms worsen and/or unresolved please follow-up with your PCP or here for further evaluation

## 2024-05-09 ENCOUNTER — Other Ambulatory Visit

## 2024-05-09 DIAGNOSIS — Z5181 Encounter for therapeutic drug level monitoring: Secondary | ICD-10-CM

## 2024-05-09 DIAGNOSIS — J849 Interstitial pulmonary disease, unspecified: Secondary | ICD-10-CM | POA: Diagnosis not present

## 2024-05-09 LAB — HEPATIC FUNCTION PANEL
ALT: 23 U/L (ref 0–53)
AST: 31 U/L (ref 0–37)
Albumin: 4 g/dL (ref 3.5–5.2)
Alkaline Phosphatase: 87 U/L (ref 39–117)
Bilirubin, Direct: 0.1 mg/dL (ref 0.0–0.3)
Total Bilirubin: 0.4 mg/dL (ref 0.2–1.2)
Total Protein: 7.6 g/dL (ref 6.0–8.3)

## 2024-05-10 ENCOUNTER — Encounter: Payer: Self-pay | Admitting: Emergency Medicine

## 2024-05-10 ENCOUNTER — Ambulatory Visit: Payer: Self-pay | Admitting: Pharmacist

## 2024-05-10 DIAGNOSIS — J8410 Pulmonary fibrosis, unspecified: Secondary | ICD-10-CM

## 2024-05-10 DIAGNOSIS — Z5181 Encounter for therapeutic drug level monitoring: Secondary | ICD-10-CM

## 2024-05-11 NOTE — Telephone Encounter (Signed)
 Most likely still having post COVID symptoms.  Will see him next week when he comes back.

## 2024-05-12 ENCOUNTER — Telehealth: Payer: Self-pay

## 2024-05-12 ENCOUNTER — Encounter: Payer: Self-pay | Admitting: Pulmonary Disease

## 2024-05-12 NOTE — Telephone Encounter (Signed)
 Spoke w/ PT [facility].   He has OV at end of month.

## 2024-05-12 NOTE — Telephone Encounter (Signed)
 It is either post-covid symptoms that he is having or side effects from the Ofev .   Recommend he take a break from the Ofev  for at least 1 month, monitor if symptoms get better.   He can reach out to us  after a month unless he has follow up visit in the mean time we can discuss further then.  Thanks,  JD

## 2024-05-21 ENCOUNTER — Ambulatory Visit (HOSPITAL_COMMUNITY): Admission: EM | Admit: 2024-05-21 | Discharge: 2024-05-21 | Disposition: A | Discharge status: Home / Self Care

## 2024-05-21 ENCOUNTER — Encounter (HOSPITAL_COMMUNITY): Payer: Self-pay

## 2024-05-21 ENCOUNTER — Ambulatory Visit (HOSPITAL_COMMUNITY)
Admission: EM | Admit: 2024-05-21 | Discharge: 2024-05-21 | Disposition: A | Discharge status: Home / Self Care | Attending: Physician Assistant | Admitting: Physician Assistant

## 2024-05-21 DIAGNOSIS — Z8781 Personal history of (healed) traumatic fracture: Secondary | ICD-10-CM | POA: Diagnosis not present

## 2024-05-21 DIAGNOSIS — G4700 Insomnia, unspecified: Secondary | ICD-10-CM | POA: Insufficient documentation

## 2024-05-21 DIAGNOSIS — F419 Anxiety disorder, unspecified: Secondary | ICD-10-CM | POA: Diagnosis not present

## 2024-05-21 DIAGNOSIS — E559 Vitamin D deficiency, unspecified: Secondary | ICD-10-CM | POA: Diagnosis not present

## 2024-05-21 DIAGNOSIS — Z8616 Personal history of COVID-19: Secondary | ICD-10-CM | POA: Diagnosis present

## 2024-05-21 DIAGNOSIS — Z79899 Other long term (current) drug therapy: Secondary | ICD-10-CM | POA: Diagnosis not present

## 2024-05-21 LAB — CBC
HCT: 43.3 % (ref 39.0–52.0)
Hemoglobin: 14.3 g/dL (ref 13.0–17.0)
MCH: 27.8 pg (ref 26.0–34.0)
MCHC: 33 g/dL (ref 30.0–36.0)
MCV: 84.2 fL (ref 80.0–100.0)
Platelets: 223 K/uL (ref 150–400)
RBC: 5.14 MIL/uL (ref 4.22–5.81)
RDW: 15.7 % — ABNORMAL HIGH (ref 11.5–15.5)
WBC: 5.5 K/uL (ref 4.0–10.5)
nRBC: 0 % (ref 0.0–0.2)

## 2024-05-21 LAB — BASIC METABOLIC PANEL WITH GFR
Anion gap: 10 (ref 5–15)
BUN: 16 mg/dL (ref 8–23)
CO2: 23 mmol/L (ref 22–32)
Calcium: 8.7 mg/dL — ABNORMAL LOW (ref 8.9–10.3)
Chloride: 105 mmol/L (ref 98–111)
Creatinine, Ser: 0.94 mg/dL (ref 0.61–1.24)
GFR, Estimated: >60 mL/min (ref >60)
Glucose, Bld: 80 mg/dL (ref 70–99)
Potassium: 3.9 mmol/L (ref 3.5–5.1)
Sodium: 138 mmol/L (ref 135–145)

## 2024-05-21 LAB — VITAMIN D 25 HYDROXY (VIT D DEFICIENCY, FRACTURES): Vit D, 25-Hydroxy: 38.51 ng/mL (ref 30–100)

## 2024-05-21 MED ORDER — HYDROXYZINE HCL 25 MG PO TABS: 25.0000 mg | ORAL_TABLET | Freq: Four times a day (QID) | ORAL | 0 refills | Status: DC | PRN

## 2024-05-21 NOTE — ED Triage Notes (Addendum)
 Patient reports that he had right knee surgery on January 4 and his sleep patterns were disturbed. Patient states he began taking Ofev  for pulmonary fibrosis on 8/6 and insomnia worsened. Patient states he also began feeling sad and having vertigo. Patient also reports that he began Paxlovid  on 9/4 which worsened his symptoms and then stated they became extreme  then. Patient states he stopped the Paxlovid  before finishing the dose ordered and called his pulmonologist who told him to stop the Ofev  x 1 month.

## 2024-05-21 NOTE — ED Notes (Signed)
 Patient was instructed to follow-up with ED or [facility] to discuss other options for anxiety medication.

## 2024-05-21 NOTE — Discharge Instructions (Addendum)
 Can take hydroxyzine  every 8 hours as needed for anxiousness. Keep primary care physician appointment on Monday. If symptoms become worse over the weekend please go to the behavioral health urgent care. Will call with lab results.   [facility] Phone [phone] Address [address] [place], [place] [address] Hours Open 24/7. No appointment required.

## 2024-05-21 NOTE — ED Provider Notes (Signed)
 [facility]    CSN: [id number] Arrival date & time: 05/21/24  1016      History   Chief Complaint Chief Complaint  Patient presents with   Insomnia   Dizziness   feeling sad    HPI Gregg Smith is a 78 y.o. male.   Patient presents with persistent insomnia that has been going on for several weeks.  He also complains of episodes of anxiousness and brain fog.  Reports he is prescribed Lunesta and has been taking that with minimal relief.  He did have a knee surgery earlier this year along with starting Ofev  prescribed by pulmonologist.  He also was diagnosed with COVID earlier this month and completed Paxlovid .  He has spoken with pulmonologist to advise stopping Ofev  for 1 month.  Patient reports he has been off of this for about a week or more and has noticed minimal relief.  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  He does have an appointment with his primary care physician on Monday and with his pulmonologist at the end of the month.  Patient reports he had an admission with behavioral health many years ago for similar symptoms.  He reports yesterday he felt like he may have needed to go to the emergency department but symptoms resolved with supportive care at home.  Patient denies HI or SI.    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   Chronic pain of right knee 09/21/2023   Centrilobular emphysema (HCC) 06/04/2023   Interstitial lung disease (HCC) 06/04/2023   Chronic cough 02/03/2023   Overactive bladder 09/17/2022   COVID-19 virus infection 04/01/2022   Aortic atherosclerosis (HCC) 03/17/2022   Acute insomnia 10/23/2021   Chronic left-sided low back pain with left-sided sciatica 03/05/2021   Unilateral primary osteoarthritis, right knee 01/31/2021   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   Scoliosis deformity of spine 04/22/2019   Generalized anxiety disorder    Panic disorder    Insomnia    Spinal stenosis of lumbar region 11/30/2018   GERD (gastroesophageal reflux disease) 08/14/2017   S/P left TKA 08/11/2016   S/P knee replacement 08/11/2016   OSA (obstructive sleep apnea) 03/22/2014   Snoring 05/09/2013   Essential hypertension 02/11/2013   Dyslipidemia 02/11/2013   Arthritis 02/11/2013    Past Surgical History:  Procedure Laterality Date   APPENDECTOMY  1957  CATARACT EXTRACTION     COLONOSCOPY     JOINT REPLACEMENT     TONSILLECTOMY     TONSILLECTOMY AND ADENOIDECTOMY  1954   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   Procedure: LEFT TOTAL KNEE ARTHROPLASTY;  Surgeon: Donnice Car, MD;  Location: [facility];  Service: Orthopedics;  Laterality: Left;   TOTAL KNEE ARTHROPLASTY Right 02/03/2024   Procedure: ARTHROPLASTY, KNEE, TOTAL;  Surgeon: Harden Jerona GAILS, MD;  Location: [facility] OR;  Service: Orthopedics;  Laterality: Right;   UPPER GASTROINTESTINAL ENDOSCOPY         Home Medications    Prior to Admission medications   Medication Sig Start Date End Date Taking? Authorizing  Provider  hydrOXYzine  (ATARAX ) 25 MG tablet Take 1 tablet (25 mg total) by mouth every 6 (six) hours as needed for anxiety. 05/21/24  Yes Ward, Harlene PEDLAR, PA-C  acetaminophen  (TYLENOL ) 500 MG tablet Take 500 mg by mouth every 6 (six) hours as needed for moderate pain (pain score 4-6) (prn for pain).    [provider]  albuterol  (VENTOLIN  HFA) 108 (90 Base) MCG/ACT inhaler Inhale 2 puffs into the lungs every 6 (six) hours as needed for wheezing or shortness of breath. 12/07/23   Kara Dorn NOVAK, MD  eszopiclone (LUNESTA) 2 MG TABS tablet Take 1 mg by mouth at bedtime. 11/12/22   [provider]  gabapentin  (NEURONTIN ) 300 MG capsule Take 1 capsule (300 mg total) by mouth 2 (two) times daily. 03/14/24   Purcell Emil Schanz, MD  L-Theanine 200 MG CAPS Take 200 mg by mouth daily.    [provider]  lansoprazole  (PREVACID ) 30 MG capsule TAKE 1 CAPSULE (30 MG TOTAL) BY MOUTH 2 (TWO) TIMES DAILY BEFORE A MEAL. 12/09/23   Abran Norleen SAILOR, MD  Milk Thistle 1000 MG CAPS Take 1,000 mg by mouth daily.    [provider]  Multiple Vitamin (MULTIVITAMIN) tablet Take 1 tablet by mouth daily.    [provider]  Nintedanib  (OFEV ) 100 MG CAPS Take 1 capsule (100 mg total) by mouth 2 (two) times daily. 05/04/24   Kara Dorn NOVAK, MD  OVER THE COUNTER MEDICATION Take 1 tablet by mouth daily. Calm Aid    [provider]  rosuvastatin  (CRESTOR ) 10 MG tablet TAKE 1 TABLET BY MOUTH EVERY DAY 07/22/23   Purcell Emil Schanz, MD  TRELEGY ELLIPTA  100-62.5-25 MCG/ACT AEPB TAKE 1 PUFF BY MOUTH EVERY DAY 05/07/24   Purcell Emil Schanz, MD  valsartan -hydrochlorothiazide  (DIOVAN -HCT) 80-12.5 MG tablet TAKE 1 TABLET BY MOUTH EVERY DAY 08/06/23   Purcell Emil Schanz, MD    Family History Family History  Problem Relation Age of Onset   Alzheimer's disease Mother    Cancer Father    Stomach cancer Father    Aortic aneurysm Son    Heart disease Son    Cancer Maternal  Grandmother    Heart disease Maternal Grandfather    Stomach cancer Paternal Grandfather    Colon cancer Neg Hx    Colon polyps Neg Hx    Esophageal cancer Neg Hx    Rectal cancer Neg Hx    Pancreatic cancer Neg Hx    Liver cancer Neg Hx     Social History Social History   Tobacco Use   Smoking status: Former    Current packs/day: 0.00    Average packs/day: 1 pack/day for 40.0 years (40.0 ttl pk-yrs)    Types: Cigarettes    Start date: 01/19/1967    Quit date: 01/19/2007  Years since quitting: 17.3    Passive exposure: Never   Smokeless tobacco: Never  Vaping Use   Vaping status: Never Used  Substance Use Topics   Alcohol use: Yes    Alcohol/week: 3.0 standard drinks of alcohol    Types: 3 Cans of beer per week   Drug use: No     Allergies   Penicillins   Review of Systems Review of Systems  Constitutional:  Positive for fatigue. Negative for chills and fever.  HENT:  Negative for ear pain and sore throat.   Eyes:  Negative for pain and visual disturbance.  Respiratory:  Negative for cough and shortness of breath.   Cardiovascular:  Negative for chest pain and palpitations.  Gastrointestinal:  Negative for abdominal pain and vomiting.  Genitourinary:  Negative for dysuria and hematuria.  Musculoskeletal:  Negative for arthralgias and back pain.  Skin:  Negative for color change and rash.  Neurological:  Positive for dizziness. Negative for seizures and syncope.  Psychiatric/Behavioral:  Positive for sleep disturbance. The patient is nervous/anxious.   All other systems reviewed and are negative.    Physical Exam Triage Vital Signs ED Triage Vitals [05/21/24 1116]  Encounter Vitals Group     BP (!) 145/91     Girls Systolic BP Percentile      Girls Diastolic BP Percentile      Boys Systolic BP Percentile      Boys Diastolic BP Percentile      Pulse Rate 70     Resp 16     Temp 98 F (36.7 C)     Temp Source Oral     SpO2 96 %     Weight       Height      Head Circumference      Peak Flow      Pain Score 0     Pain Loc      Pain Education      Exclude from Growth Chart    No data found.  Updated Vital Signs BP (!) 145/91 (BP Location: Right Arm) Comment: Patient states he did not take his BP med last night.  Pulse 70   Temp 98 F (36.7 C) (Oral)   Resp 16   SpO2 96%   Visual Acuity Right Eye Distance:   Left Eye Distance:   Bilateral Distance:    Right Eye Near:   Left Eye Near:    Bilateral Near:     Physical Exam Vitals and nursing note reviewed.  Constitutional:      General: He is not in acute distress.    Appearance: He is well-developed.  HENT:     Head: Normocephalic and atraumatic.  Eyes:     Conjunctiva/sclera: Conjunctivae normal.  Cardiovascular:     Rate and Rhythm: Normal rate and regular rhythm.     Heart sounds: No murmur heard. Pulmonary:     Effort: Pulmonary effort is normal. No respiratory distress.     Breath sounds: Normal breath sounds.  Abdominal:     Palpations: Abdomen is soft.     Tenderness: There is no abdominal tenderness.  Musculoskeletal:        General: No swelling.     Cervical back: Neck supple.  Skin:    General: Skin is warm and dry.     Capillary Refill: Capillary refill takes less than 2 seconds.  Neurological:     Mental Status: He is alert.  Psychiatric:  Mood and Affect: Mood normal.      UC Treatments / Results  Labs (all labs ordered are listed, but only abnormal results are displayed) Labs Reviewed  BASIC METABOLIC PANEL WITH GFR  CBC  VITAMIN D  25 HYDROXY (VIT D DEFICIENCY, FRACTURES)    EKG   Radiology No results found.  Procedures Procedures (including critical care time)  Medications Ordered in UC Medications - No data to display  Initial Impression / Assessment and Plan / UC Course  I have reviewed the triage vital signs and the nursing notes.  Pertinent labs & imaging results that were available during my care of the  patient were reviewed by me and considered in my medical decision making (see chart for details).     Patient speaking in complete sentences.  His son is with him today and offering support with answering questions as well.  Patient has appointment with PCP on Monday.  Will prescribe hydroxyzine  today and do some basic lab work.  Advise behavioral health urgent care over the weekend if symptoms worsen until he can see his primary care physician. Final Clinical Impressions(s) / UC Diagnoses   Final diagnoses:  Insomnia, unspecified type     Discharge Instructions      Can take hydroxyzine  every 8 hours as needed for anxiousness. Keep primary care physician appointment on Monday. If symptoms become worse over the weekend please go to the behavioral health urgent care. Will call with lab results.   [facility] Phone [phone] Address [address] [place], [place] [address] Hours Open 24/7. No appointment required.     ED Prescriptions     Medication Sig Dispense Auth. Provider   hydrOXYzine  (ATARAX ) 25 MG tablet Take 1 tablet (25 mg total) by mouth every 6 (six) hours as needed for anxiety. 12 tablet Ward, Quency Tober Z, PA-C      PDMP not reviewed this encounter.   Ward, Harlene PEDLAR, PA-C 05/21/24 1224

## 2024-05-22 ENCOUNTER — Other Ambulatory Visit: Payer: Self-pay | Admitting: Medical Genetics

## 2024-05-23 ENCOUNTER — Encounter: Payer: Self-pay | Admitting: Emergency Medicine

## 2024-05-23 ENCOUNTER — Ambulatory Visit (HOSPITAL_COMMUNITY): Payer: Self-pay

## 2024-05-23 ENCOUNTER — Ambulatory Visit: Admitting: Emergency Medicine

## 2024-05-23 ENCOUNTER — Other Ambulatory Visit: Payer: Self-pay | Admitting: Emergency Medicine

## 2024-05-23 VITALS — BP 118/74 | HR 71 | Temp 98.2°F | Ht 70.0 in | Wt 237.0 lb

## 2024-05-23 DIAGNOSIS — G4733 Obstructive sleep apnea (adult) (pediatric): Secondary | ICD-10-CM

## 2024-05-23 DIAGNOSIS — R4189 Other symptoms and signs involving cognitive functions and awareness: Secondary | ICD-10-CM | POA: Insufficient documentation

## 2024-05-23 DIAGNOSIS — J849 Interstitial pulmonary disease, unspecified: Secondary | ICD-10-CM

## 2024-05-23 DIAGNOSIS — G8929 Other chronic pain: Secondary | ICD-10-CM | POA: Diagnosis not present

## 2024-05-23 DIAGNOSIS — M5442 Lumbago with sciatica, left side: Secondary | ICD-10-CM | POA: Diagnosis not present

## 2024-05-23 DIAGNOSIS — F411 Generalized anxiety disorder: Secondary | ICD-10-CM | POA: Diagnosis not present

## 2024-05-23 DIAGNOSIS — I10 Essential (primary) hypertension: Secondary | ICD-10-CM

## 2024-05-23 DIAGNOSIS — G4700 Insomnia, unspecified: Secondary | ICD-10-CM | POA: Diagnosis not present

## 2024-05-23 MED ORDER — HYDROXYZINE HCL 25 MG PO TABS: 25.0000 mg | ORAL_TABLET | Freq: Every evening | ORAL | 3 refills | Status: DC | PRN

## 2024-05-23 NOTE — Progress Notes (Signed)
 Gregg Smith Nones 78 y.o.   Chief Complaint  Patient presents with   Medication Problem    Patient here for medication concerns believes it may be with Ofev  or paxlovid . He is having brain fog, dizziness, wandering thoughts, sad thoughts. He had recently had a teeth cleaning and now has some lips tingling, some cold symptom only some mucus now.     HISTORY OF PRESENT ILLNESS: This is a 78 y.o. male complaining of episodes of dizziness and brain fog over the past several weeks Had COVID infection earlier this month.  Took Paxlovid  for 4 days. Has history of interstitial lung disease.  Had been taking Ofev  but stopped several weeks ago due to possible side effects.  Scheduled to follow-up with pulmonary doctor next Friday. Was not getting quality sleep and only 3 to 4 hours per night.  Went to urgent care clinic over the weekend and was prescribed hydroxyzine  to help him sleep Hydroxyzine  did help I was able to get 7 to 8-hour sleep with improvement of symptoms Does not think he is clinically depressed. Feels dizzy but no falls and no gait disturbance. States his thoughts wonder but he can carry conversations as normal.  At times thoughts becomes but he can usually redirect them. No other complaints or medical concerns today.  HPI   Prior to Admission medications   Medication Sig Start Date End Date Taking? Authorizing Provider  acetaminophen  (TYLENOL ) 500 MG tablet Take 500 mg by mouth every 6 (six) hours as needed for moderate pain (pain score 4-6) (prn for pain).   Yes [provider]  albuterol  (VENTOLIN  HFA) 108 (90 Base) MCG/ACT inhaler Inhale 2 puffs into the lungs every 6 (six) hours as needed for wheezing or shortness of breath. 12/07/23  Yes Kara Dorn NOVAK, MD  eszopiclone (LUNESTA) 2 MG TABS tablet Take 1 mg by mouth at bedtime. 11/12/22  Yes [provider]  gabapentin  (NEURONTIN ) 300 MG capsule Take 1 capsule (300 mg total) by mouth 2 (two) times daily.  03/14/24  Yes Hermilo Dutter, Emil Schanz, MD  hydrOXYzine  (ATARAX ) 25 MG tablet Take 1 tablet (25 mg total) by mouth every 6 (six) hours as needed for anxiety. 05/21/24  Yes Ward, Harlene PEDLAR, PA-C  L-Theanine 200 MG CAPS Take 200 mg by mouth daily.   Yes [provider]  lansoprazole  (PREVACID ) 30 MG capsule TAKE 1 CAPSULE (30 MG TOTAL) BY MOUTH 2 (TWO) TIMES DAILY BEFORE A MEAL. 12/09/23  Yes Abran Norleen SAILOR, MD  Milk Thistle 1000 MG CAPS Take 1,000 mg by mouth daily.   Yes [provider]  Multiple Vitamin (MULTIVITAMIN) tablet Take 1 tablet by mouth daily.   Yes [provider]  Nintedanib  (OFEV ) 100 MG CAPS Take 1 capsule (100 mg total) by mouth 2 (two) times daily. 05/04/24  Yes Kara Dorn NOVAK, MD  OVER THE COUNTER MEDICATION Take 1 tablet by mouth daily. Calm Aid   Yes [provider]  rosuvastatin  (CRESTOR ) 10 MG tablet TAKE 1 TABLET BY MOUTH EVERY DAY 07/22/23  Yes Ruari Duggan Jose, MD  TRELEGY ELLIPTA  100-62.5-25 MCG/ACT AEPB TAKE 1 PUFF BY MOUTH EVERY DAY 05/07/24  Yes Sakina Briones, Emil Schanz, MD  valsartan -hydrochlorothiazide  (DIOVAN -HCT) 80-12.5 MG tablet TAKE 1 TABLET BY MOUTH EVERY DAY 08/06/23  Yes Honor Fairbank Jose, MD    Allergies  Allergen Reactions   Penicillins Hives and Rash    Has patient had a PCN reaction causing immediate rash, facial/tongue/throat swelling, SOB or lightheadedness with hypotension:unsure Has  patient had a PCN reaction causing severe rash involving mucus membranes or skin necrosis:unsure Has patient had a PCN reaction that required hospitalization:No Has patient had a PCN reaction occurring within the last 10 years:No If all of the above answers are NO, then may proceed with Cephalosporin use. 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   Chronic pain of right knee 09/21/2023   Centrilobular emphysema (HCC) 06/04/2023   Interstitial lung disease (HCC) 06/04/2023   Chronic cough 02/03/2023   Overactive bladder 09/17/2022   Aortic atherosclerosis (HCC) 03/17/2022   Acute insomnia 10/23/2021   Chronic left-sided low back pain with left-sided sciatica 03/05/2021   Unilateral primary osteoarthritis, right knee 01/31/2021   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   Scoliosis deformity of spine 04/22/2019   Generalized anxiety disorder    Panic disorder    Insomnia    Spinal stenosis of lumbar region 11/30/2018   GERD (gastroesophageal reflux disease) 08/14/2017   S/P left TKA 08/11/2016   S/P knee replacement 08/11/2016   OSA (obstructive sleep apnea) 03/22/2014   Snoring 05/09/2013   Essential hypertension 02/11/2013   Dyslipidemia 02/11/2013   Arthritis 02/11/2013    Past Medical History:  Diagnosis Date   Allergy    seasonal    Anxiety    Arthritis    Barrett's esophagus    Cataract    Chicken pox    Colon polyps    hyperplastic   Diverticulosis    Emphysema lung (HCC)    GERD (gastroesophageal reflux disease)    Hemorrhoids    Hyperlipidemia    Hypertension    ILD (interstitial lung disease) (HCC)    12/18/23 Mild basilar fibrotic interstitial lung disease, 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   Insomnia    Leg cramps    Measles    Mumps    Obstructive sleep apnea    Pneumonia    Pulmonary fibrosis (HCC)    Sleep apnea    dental device; no longer uses    Past Surgical History:  Procedure Laterality Date   APPENDECTOMY  1957   CATARACT EXTRACTION     COLONOSCOPY     JOINT REPLACEMENT     TONSILLECTOMY     TONSILLECTOMY AND ADENOIDECTOMY  1954   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   Procedure: LEFT TOTAL KNEE ARTHROPLASTY;  Surgeon: Donnice Car, MD;  Location: [facility];  Service: Orthopedics;  Laterality:  Left;   TOTAL KNEE ARTHROPLASTY Right 02/03/2024   Procedure: ARTHROPLASTY, KNEE, TOTAL;  Surgeon: Harden Jerona GAILS, MD;  Location: [facility];  Service: Orthopedics;  Laterality: Right;   UPPER GASTROINTESTINAL ENDOSCOPY      Social History   Socioeconomic History   Marital status: Widowed    Spouse name: [name]   Number of children: 1   Years of education: PHD   Highest education level: Doctorate  Occupational History   Occupation: Retired    Comment: Loss adjuster, chartered: [employer]  Tobacco Use   Smoking status: Former    Current packs/day: 0.00    Average packs/day: 1 pack/day for 40.0 years (40.0 ttl pk-yrs)    Types: Cigarettes    Start date: 01/19/1967    Quit date: 01/19/2007  Years since quitting: 17.3    Passive exposure: Never   Smokeless tobacco: Never  Vaping Use   Vaping status: Never Used  Substance and Sexual Activity   Alcohol use: Yes    Alcohol/week: 3.0 standard drinks of alcohol    Types: 3 Cans of beer per week   Drug use: No   Sexual activity: Not Currently  Other Topics Concern   Not on file  Social History Narrative   Patient lives alone in his home   Patient is retired   Patient has a Ph.D   Patient has one adult child.   Patient is right-handed.   Patient drinks 1 cup of tea and 1 cup of coffee daily    Social Drivers of Health   Financial Resource Strain: Low Risk  (03/17/2024)   Overall Financial Resource Strain (CARDIA)    Difficulty of Paying Living Expenses: Not hard at all  Food Insecurity: No Food Insecurity (03/17/2024)   Hunger Vital Sign    Worried About Running Out of Food in the Last Year: Never true    Ran Out of Food in the Last Year: Never true  Transportation Needs: No Transportation Needs (03/17/2024)   PRAPARE - Administrator, Civil Service (Medical): No    Lack of Transportation (Non-Medical): No  Physical Activity: Sufficiently Active (03/17/2024)   Exercise Vital Sign    Days of  Exercise per Week: 3 days    Minutes of Exercise per Session: 60 min  Stress: No Stress Concern Present (03/17/2024)   [employer] of Occupational Health - Occupational Stress Questionnaire    Feeling of Stress: Only a little  Social Connections: Moderately Integrated (03/17/2024)   Social Connection and Isolation Panel    Frequency of Communication with Friends and Family: More than three times a week    Frequency of Social Gatherings with Friends and Family: Three times a week    Attends Religious Services: More than 4 times per year    Active Member of Clubs or Organizations: Yes    Attends Banker Meetings: More than 4 times per year    Marital Status: Widowed  Intimate Partner Violence: Not At Risk (03/17/2024)   Humiliation, Afraid, Rape, and Kick questionnaire    Fear of Current or Ex-Partner: No    Emotionally Abused: No    Physically Abused: No    Sexually Abused: No    Family History  Problem Relation Age of Onset   Alzheimer's disease Mother    Cancer Father    Stomach cancer Father    Aortic aneurysm Son    Heart disease Son    Cancer Maternal Grandmother    Heart disease Maternal Grandfather    Stomach cancer Paternal Grandfather    Colon cancer Neg Hx    Colon polyps Neg Hx    Esophageal cancer Neg Hx    Rectal cancer Neg Hx    Pancreatic cancer Neg Hx    Liver cancer Neg Hx      Review of Systems  Constitutional: Negative.  Negative for chills and fever.  HENT: Negative.  Negative for congestion and sore throat.   Respiratory: Negative.  Negative for cough and shortness of breath.   Cardiovascular: Negative.  Negative for chest pain and palpitations.  Gastrointestinal:  Negative for abdominal pain, diarrhea, nausea and vomiting.  Genitourinary: Negative.  Negative for dysuria and hematuria.  Skin: Negative.  Negative for rash.  Neurological:  Positive for dizziness.  Psychiatric/Behavioral:  The patient has insomnia.   All other systems  reviewed and are negative.   Today's Vitals   05/23/24 1300  BP: 118/74  Pulse: 71  Temp: 98.2 F (36.8 C)  TempSrc: Oral  SpO2: 96%  Weight: 237 lb (107.5 kg)  Height: 5' 10 (1.778 m)   Body mass index is 34.01 kg/m.   Physical Exam Vitals reviewed.  Constitutional:      Appearance: Normal appearance.  HENT:     Head: Normocephalic.     Mouth/Throat:     Mouth: Mucous membranes are moist.     Pharynx: Oropharynx is clear.  Eyes:     Extraocular Movements: Extraocular movements intact.     Pupils: Pupils are equal, round, and reactive to light.  Cardiovascular:     Rate and Rhythm: Normal rate and regular rhythm.     Pulses: Normal pulses.     Heart sounds: Normal heart sounds.  Pulmonary:     Effort: Pulmonary effort is normal.     Breath sounds: Normal breath sounds.  Abdominal:     Palpations: Abdomen is soft.     Tenderness: There is no abdominal tenderness.  Musculoskeletal:     Cervical back: No tenderness.  Lymphadenopathy:     Cervical: No cervical adenopathy.  Skin:    General: Skin is warm and dry.     Capillary Refill: Capillary refill takes less than 2 seconds.  Neurological:     General: No focal deficit present.     Mental Status: He is alert and oriented to person, place, and time.  Psychiatric:        Mood and Affect: Mood normal.        Behavior: Behavior normal.      ASSESSMENT & PLAN: A total of 44 minutes was spent with the patient and counseling/coordination of care regarding preparing for this visit, review of most recent office visit notes, review of multiple chronic medical conditions and their management, differential diagnosis of brain fog and need for neurology evaluation, review of all medications, review of most recent bloodwork results, review of health maintenance items, education on nutrition, prognosis, documentation, and need for follow up.   Problem List Items Addressed This Visit       Cardiovascular and Mediastinum    Essential hypertension   BP Readings from Last 3 Encounters:  05/23/24 118/74  05/21/24 (!) 145/91  05/08/24 (!) 149/94  Well-controlled hypertension Continue valsartan  hydrochlorothiazide  80-12.5 mg daily Cardiovascular risks associated with hypertension discussed          Respiratory   OSA (obstructive sleep apnea)   Continue CPAP treatment      Interstitial lung disease (HCC)   Diagnosed with pulmonary fibrosis. Told his pulmonary function was doing well Continues Trelegy daily and albuterol  as needed Scheduled to follow-up with them soon Started on Ofev  150 mg twice a day Stopped 3 weeks ago due to possible side effects Scheduled to follow-up with pulmonary next Friday        Nervous and Auditory   Chronic left-sided low back pain with left-sided sciatica   Has occasional flareups Taking gabapentin  300 mg twice a day Was seen by orthopedist who is recommending spinal procedure Patient considering minimally invasive surgery        Other   Generalized anxiety disorder   Chronic problem probably contributing to some of his symptoms Sees a therapist on a regular basis      Insomnia   Chronic and intermittent Affecting quality of  life Contributing greatly to present symptoms Hydroxyzine  is helping Continues Lunesta 2 mg at bedtime Recommend to continue hydroxyzine  25 mg at bedtime      Brain fog - Primary   Active and affecting quality of life Mental health management discussed Differential diagnosis discussed Lack of quality sleep contributing Insomnia management addressed Hydroxyzine  starting to help Recommend neurology evaluation.  Referral placed today.      Relevant Orders   Ambulatory referral to Neurology   Patient Instructions  Health Maintenance After Age 51 After age 32, you are at a higher risk for certain long-term diseases and infections as well as injuries from falls. Falls are a major cause of broken bones and head injuries in people  who are older than age 40. Getting regular preventive care can help to keep you healthy and well. Preventive care includes getting regular testing and making lifestyle changes as recommended by your health care provider. Talk with your health care provider about: Which screenings and tests you should have. A screening is a test that checks for a disease when you have no symptoms. A diet and exercise plan that is right for you. What should I know about screenings and tests to prevent falls? Screening and testing are the best ways to find a health problem early. Early diagnosis and treatment give you the best chance of managing medical conditions that are common after age 71. Certain conditions and lifestyle choices may make you more likely to have a fall. Your health care provider may recommend: Regular vision checks. Poor vision and conditions such as cataracts can make you more likely to have a fall. If you wear glasses, make sure to get your prescription updated if your vision changes. Medicine review. Work with your health care provider to regularly review all of the medicines you are taking, including over-the-counter medicines. Ask your health care provider about any side effects that may make you more likely to have a fall. Tell your health care provider if any medicines that you take make you feel dizzy or sleepy. Strength and balance checks. Your health care provider may recommend certain tests to check your strength and balance while standing, walking, or changing positions. Foot health exam. Foot pain and numbness, as well as not wearing proper footwear, can make you more likely to have a fall. Screenings, including: Osteoporosis screening. Osteoporosis is a condition that causes the bones to get weaker and break more easily. Blood pressure screening. Blood pressure changes and medicines to control blood pressure can make you feel dizzy. Depression screening. You may be more likely to have a  fall if you have a fear of falling, feel depressed, or feel unable to do activities that you used to do. Alcohol use screening. Using too much alcohol can affect your balance and may make you more likely to have a fall. Follow these instructions at home: Lifestyle Do not drink alcohol if: Your health care provider tells you not to drink. If you drink alcohol: Limit how much you have to: 0-1 drink a day for women. 0-2 drinks a day for men. Know how much alcohol is in your drink. In the [place] one drink equals one 12 oz bottle of beer (355 mL), one 5 oz glass of wine (148 mL), or one 1 oz glass of hard liquor (44 mL). Do not use any products that contain nicotine or tobacco. These products include cigarettes, chewing tobacco, and vaping devices, such as e-cigarettes. If you need help quitting, ask your health  care provider. Activity  Follow a regular exercise program to stay fit. This will help you maintain your balance. Ask your health care provider what types of exercise are appropriate for you. If you need a cane or walker, use it as recommended by your health care provider. Wear supportive shoes that have nonskid soles. Safety  Remove any tripping hazards, such as rugs, cords, and clutter. Install safety equipment such as grab bars in bathrooms and safety rails on stairs. Keep rooms and walkways well-lit. General instructions Talk with your health care provider about your risks for falling. Tell your health care provider if: You fall. Be sure to tell your health care provider about all falls, even ones that seem minor. You feel dizzy, tiredness (fatigue), or off-balance. Take over-the-counter and prescription medicines only as told by your health care provider. These include supplements. Eat a healthy diet and maintain a healthy weight. A healthy diet includes low-fat dairy products, low-fat (lean) meats, and fiber from whole grains, beans, and lots of fruits and vegetables. Stay  current with your vaccines. Schedule regular health, dental, and eye exams. Summary Having a healthy lifestyle and getting preventive care can help to protect your health and wellness after age 14. Screening and testing are the best way to find a health problem early and help you avoid having a fall. Early diagnosis and treatment give you the best chance for managing medical conditions that are more common for people who are older than age 24. Falls are a major cause of broken bones and head injuries in people who are older than age 4. Take precautions to prevent a fall at home. Work with your health care provider to learn what changes you can make to improve your health and wellness and to prevent falls. This information is not intended to replace advice given to you by your health care provider. Make sure you discuss any questions you have with your health care provider. Document Revised: 01/07/2021 Document Reviewed: 01/07/2021 Elsevier Patient Education  2024 Elsevier Inc.    Emil Schaumann, MD [facility]

## 2024-05-23 NOTE — Assessment & Plan Note (Signed)
 BP Readings from Last 3 Encounters:  05/23/24 118/74  05/21/24 (!) 145/91  05/08/24 (!) 149/94  Well-controlled hypertension Continue valsartan  hydrochlorothiazide  80-12.5 mg daily Cardiovascular risks associated with hypertension discussed

## 2024-05-23 NOTE — Assessment & Plan Note (Signed)
 Active and affecting quality of life Mental health management discussed Differential diagnosis discussed Lack of quality sleep contributing Insomnia management addressed Hydroxyzine  starting to help Recommend neurology evaluation.  Referral placed today.

## 2024-05-23 NOTE — Assessment & Plan Note (Signed)
Continue CPAP treatment

## 2024-05-23 NOTE — Patient Instructions (Signed)
 Health Maintenance After Age 78 After age 27, you are at a higher risk for certain long-term diseases and infections as well as injuries from falls. Falls are a major cause of broken bones and head injuries in people who are older than age 73. Getting regular preventive care can help to keep you healthy and well. Preventive care includes getting regular testing and making lifestyle changes as recommended by your health care provider. Talk with your health care provider about: Which screenings and tests you should have. A screening is a test that checks for a disease when you have no symptoms. A diet and exercise plan that is right for you. What should I know about screenings and tests to prevent falls? Screening and testing are the best ways to find a health problem early. Early diagnosis and treatment give you the best chance of managing medical conditions that are common after age 90. Certain conditions and lifestyle choices may make you more likely to have a fall. Your health care provider may recommend: Regular vision checks. Poor vision and conditions such as cataracts can make you more likely to have a fall. If you wear glasses, make sure to get your prescription updated if your vision changes. Medicine review. Work with your health care provider to regularly review all of the medicines you are taking, including over-the-counter medicines. Ask your health care provider about any side effects that may make you more likely to have a fall. Tell your health care provider if any medicines that you take make you feel dizzy or sleepy. Strength and balance checks. Your health care provider may recommend certain tests to check your strength and balance while standing, walking, or changing positions. Foot health exam. Foot pain and numbness, as well as not wearing proper footwear, can make you more likely to have a fall. Screenings, including: Osteoporosis screening. Osteoporosis is a condition that causes  the bones to get weaker and break more easily. Blood pressure screening. Blood pressure changes and medicines to control blood pressure can make you feel dizzy. Depression screening. You may be more likely to have a fall if you have a fear of falling, feel depressed, or feel unable to do activities that you used to do. Alcohol  use screening. Using too much alcohol  can affect your balance and may make you more likely to have a fall. Follow these instructions at home: Lifestyle Do not drink alcohol  if: Your health care provider tells you not to drink. If you drink alcohol : Limit how much you have to: 0-1 drink a day for women. 0-2 drinks a day for men. Know how much alcohol  is in your drink. In the [place] one drink equals one 12 oz bottle of beer (355 mL), one 5 oz glass of wine (148 mL), or one 1 oz glass of hard liquor (44 mL). Do not use any products that contain nicotine or tobacco. These products include cigarettes, chewing tobacco, and vaping devices, such as e-cigarettes. If you need help quitting, ask your health care provider. Activity  Follow a regular exercise program to stay fit. This will help you maintain your balance. Ask your health care provider what types of exercise are appropriate for you. If you need a cane or walker, use it as recommended by your health care provider. Wear supportive shoes that have nonskid soles. Safety  Remove any tripping hazards, such as rugs, cords, and clutter. Install safety equipment such as grab bars in bathrooms and safety rails on stairs. Keep rooms and walkways  well-lit. General instructions Talk with your health care provider about your risks for falling. Tell your health care provider if: You fall. Be sure to tell your health care provider about all falls, even ones that seem minor. You feel dizzy, tiredness (fatigue), or off-balance. Take over-the-counter and prescription medicines only as told by your health care provider. These include  supplements. Eat a healthy diet and maintain a healthy weight. A healthy diet includes low-fat dairy products, low-fat (lean) meats, and fiber from whole grains, beans, and lots of fruits and vegetables. Stay current with your vaccines. Schedule regular health, dental, and eye exams. Summary Having a healthy lifestyle and getting preventive care can help to protect your health and wellness after age 15. Screening and testing are the best way to find a health problem early and help you avoid having a fall. Early diagnosis and treatment give you the best chance for managing medical conditions that are more common for people who are older than age 42. Falls are a major cause of broken bones and head injuries in people who are older than age 64. Take precautions to prevent a fall at home. Work with your health care provider to learn what changes you can make to improve your health and wellness and to prevent falls. This information is not intended to replace advice given to you by your health care provider. Make sure you discuss any questions you have with your health care provider. Document Revised: 01/07/2021 Document Reviewed: 01/07/2021 Elsevier Patient Education  2024 [employer].

## 2024-05-23 NOTE — Assessment & Plan Note (Signed)
 Chronic and intermittent Affecting quality of life Contributing greatly to present symptoms Hydroxyzine  is helping Continues Lunesta 2 mg at bedtime Recommend to continue hydroxyzine  25 mg at bedtime

## 2024-05-23 NOTE — Assessment & Plan Note (Signed)
 Chronic problem probably contributing to some of his symptoms Sees a therapist on a regular basis

## 2024-05-23 NOTE — Assessment & Plan Note (Signed)
 Has occasional flareups Taking gabapentin  300 mg twice a day Was seen by orthopedist who is recommending spinal procedure Patient considering minimally invasive surgery

## 2024-05-23 NOTE — Assessment & Plan Note (Signed)
 Diagnosed with pulmonary fibrosis. Told his pulmonary function was doing well Continues Trelegy daily and albuterol  as needed Scheduled to follow-up with them soon Started on Ofev  150 mg twice a day Stopped 3 weeks ago due to possible side effects Scheduled to follow-up with pulmonary next Friday

## 2024-05-23 NOTE — Telephone Encounter (Signed)
 New prescription sent to pharmacy of record today.  Thanks.

## 2024-05-30 ENCOUNTER — Encounter: Payer: Self-pay | Admitting: Pulmonary Disease

## 2024-05-30 ENCOUNTER — Ambulatory Visit: Admitting: Pulmonary Disease

## 2024-05-30 ENCOUNTER — Telehealth: Payer: Self-pay

## 2024-05-30 VITALS — BP 142/70 | HR 75 | Ht 71.0 in | Wt 239.0 lb

## 2024-05-30 DIAGNOSIS — G4733 Obstructive sleep apnea (adult) (pediatric): Secondary | ICD-10-CM | POA: Diagnosis not present

## 2024-05-30 DIAGNOSIS — J849 Interstitial pulmonary disease, unspecified: Secondary | ICD-10-CM | POA: Diagnosis not present

## 2024-05-30 DIAGNOSIS — R4189 Other symptoms and signs involving cognitive functions and awareness: Secondary | ICD-10-CM

## 2024-05-30 DIAGNOSIS — R42 Dizziness and giddiness: Secondary | ICD-10-CM

## 2024-05-30 NOTE — Progress Notes (Signed)
 Synopsis: Referred in April 2024 for Emphysema  Subjective:   PATIENT ID: Gregg Smith GENDER: male DOB: October 19, 1945, MRN: [id number]  HPI  Chief Complaint  Patient presents with   Medical Management of Chronic Issues    PT states having some complications w/ Ofev     Gregg Smith is a 78 year old male, former smoker with history of GERD, hypertension, seasonal allergies and OSA who returns to pulmonary clinic for emphysema and ILD.  Initial OV 12/07/23 He reports some exertional dyspnea and wheezing with activities such as running and sometimes while gardening. He notes difficulty with running and jumping jacks, attributing it to joint discomfort rather than endurance issues. This has been ongoing for a long time, but he has only recently engaged in more regimented exercise.  He has a productive cough approximately twice a day, with phlegm production, and frequently needs to clear his throat, especially before singing or speaking after a period of silence.  He quit smoking in 2000 after smoking two packs a day since the age of 39. 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  He has a history of sleep apnea, for which he was initially given a mouthpiece instead of a CPAP machine. After losing 35 pounds through a lifestyle program, his son noted a reduction in snoring, suggesting an improvement in his sleep apnea. He no longer uses the mouthpiece due to dental work and has not noticed significant issues since the weight loss.  He has a history of insomnia, which began in early 2020, coinciding with an episode of sciatica that resolved after several months. During this period, he required hospitalization for insomnia management and was placed on a medication regimen that has since been reduced. He currently uses Lunesta to aid sleep, as he finds it  difficult to sleep without medication.  OV 03/09/24 He experiences shortness of breath and uses a Trelegy inhaler, though he finds it difficult to establish a baseline for its effectiveness. He was prescribed albuterol  but has discontinued its use. A high-resolution CT scan shows basilar predominant subpleural reticular densities and ground glass with traction bronchiolectasis, with more pronounced scarring in the lower lung regions. Despite these findings, his pulmonary function tests remain normal. He has a history of smoking, which is a significant factor in his pulmonary fibrosis.   We discussed starting antifibrotic therapy and he is interested in meeting with our pharmacy team.  OV 05/30/24 He experiences worsening brain fog and dizziness, particularly in the late afternoon and evening, exacerbated by lack of sleep. Insomnia, present since 2020, has worsened recently. He uses hydroxyzine  sparingly for sleep due to concerns about side effects. Lunesta was resumed at 2 mg post-knee surgery. A sleep study in April showed moderate sleep apnea, with symptoms worsening when sleeping on his back per his son. An oral appliance was previously used, but has not been using it recently. He reports losing 40 pounds over past year.  Discussed with son and patient concerns for insomnia, anxiety/depression, medication side effects and sleep apnea adding to his brain fog and dizziness symptoms. Discussed step wise plan to work on sorting out his issues.   Recommended starting CPAP therapy which he was amenable to.  We discussed pulmonary fibrosis diagnosis at length. Discussed treatment options, and when we will consider resuming them in the future after his current symptoms are further evaluated  regarding the dizziness and brain fog. Recommended resuming Ofev  early next year once he is able to tolerate CPAP therapy. Discussed prognosis of pulmonary fibrosis, potential complications with flairs in breathing due to  infections or natural progression of the disease itself.   Recommended annual vaccines for flu, covid and RSV.   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   Insomnia    Leg cramps    Measles    Mumps    Obstructive sleep apnea    Pneumonia    Pulmonary fibrosis (HCC)    Sleep apnea    dental device; no longer uses     Family History  Problem Relation Age of Onset   Alzheimer's disease Mother    Cancer Father    Stomach cancer Father    Aortic aneurysm Son    Heart disease Son    Cancer Maternal Grandmother    Heart disease Maternal Grandfather    Stomach cancer Paternal Grandfather    Colon cancer Neg Hx    Colon polyps Neg Hx    Esophageal cancer Neg Hx    Rectal cancer Neg Hx    Pancreatic cancer Neg Hx    Liver cancer Neg Hx      Social History   Socioeconomic History   Marital status: Widowed    Spouse name: [name]   Number of children: 1   Years of education: PHD   Highest education level: Doctorate  Occupational History   Occupation: Retired    Comment: Loss adjuster, chartered: [employer]  Tobacco Use   Smoking status: Former    Current packs/day: 0.00    Average packs/day: 1 pack/day for 40.0 years (40.0 ttl pk-yrs)    Types: Cigarettes    Start date: 01/19/1967    Quit date: 01/19/2007    Years since quitting: 17.3    Passive exposure: Never   Smokeless tobacco: Never  Vaping Use   Vaping status: Never Used  Substance and Sexual Activity   Alcohol use: Yes    Alcohol/week: 3.0 standard drinks of alcohol    Types: 3  Cans of beer per week   Drug use: No   Sexual activity: Not Currently  Other Topics Concern   Not on file  Social History Narrative   Patient lives alone in his home   Patient is retired   Patient has a Ph.D   Patient has one adult child.   Patient is right-handed.   Patient drinks 1 cup of tea and 1 cup of coffee daily    Social Drivers of Health   Financial Resource Strain: Low Risk  (03/17/2024)   Overall Financial Resource Strain (CARDIA)    Difficulty of Paying Living Expenses: Not hard at all  Food Insecurity: No Food Insecurity (03/17/2024)   Hunger Vital Sign    Worried About Running Out of Food in the Last Year: Never true    Ran Out of Food  in the Last Year: Never true  Transportation Needs: No Transportation Needs (03/17/2024)   PRAPARE - Administrator, Civil Service (Medical): No    Lack of Transportation (Non-Medical): No  Physical Activity: Sufficiently Active (03/17/2024)   Exercise Vital Sign    Days of Exercise per Week: 3 days    Minutes of Exercise per Session: 60 min  Stress: No Stress Concern Present (03/17/2024)   [employer] of Occupational Health - Occupational Stress Questionnaire    Feeling of Stress: Only a little  Social Connections: Moderately Integrated (03/17/2024)   Social Connection and Isolation Panel    Frequency of Communication with Friends and Family: More than three times a week    Frequency of Social Gatherings with Friends and Family: Three times a week    Attends Religious Services: More than 4 times per year    Active Member of Clubs or Organizations: Yes    Attends Banker Meetings: More than 4 times per year    Marital Status: Widowed  Intimate Partner Violence: Not At Risk (03/17/2024)   Humiliation, Afraid, Rape, and Kick questionnaire    Fear of Current or Ex-Partner: No    Emotionally Abused: No    Physically Abused: No    Sexually Abused: No     Allergies  Allergen Reactions   Penicillins  Hives and Rash    Has patient had a PCN reaction causing immediate rash, facial/tongue/throat swelling, SOB or lightheadedness with hypotension:unsure Has patient had a PCN reaction causing severe rash involving mucus membranes or skin necrosis:unsure Has patient had a PCN reaction that required hospitalization:No Has patient had a PCN reaction occurring within the last 10 years:No If all of the above answers are NO, then may proceed with Cephalosporin use. Childhood reaction      Outpatient Medications Prior to Visit  Medication Sig Dispense Refill   acetaminophen  (TYLENOL ) 500 MG tablet Take 500 mg by mouth every 6 (six) hours as needed for moderate pain (pain score 4-6) (prn for pain).     albuterol  (VENTOLIN  HFA) 108 (90 Base) MCG/ACT inhaler Inhale 2 puffs into the lungs every 6 (six) hours as needed for wheezing or shortness of breath. 8 g 6   eszopiclone (LUNESTA) 2 MG TABS tablet Take 1 mg by mouth at bedtime.     gabapentin  (NEURONTIN ) 300 MG capsule Take 1 capsule (300 mg total) by mouth 2 (two) times daily. 180 capsule 1   hydrOXYzine  (ATARAX ) 25 MG tablet Take 1 tablet (25 mg total) by mouth at bedtime as needed for anxiety. 30 tablet 3   L-Theanine 200 MG CAPS Take 200 mg by mouth daily.     lansoprazole  (PREVACID ) 30 MG capsule TAKE 1 CAPSULE (30 MG TOTAL) BY MOUTH 2 (TWO) TIMES DAILY BEFORE A MEAL. 180 capsule 1   Milk Thistle 1000 MG CAPS Take 1,000 mg by mouth daily.     Multiple Vitamin (MULTIVITAMIN) tablet Take 1 tablet by mouth daily.     Nintedanib  (OFEV ) 100 MG CAPS Take 1 capsule (100 mg total) by mouth 2 (two) times daily. 60 capsule 2   OVER THE COUNTER MEDICATION Take 1 tablet by mouth daily. Calm Aid     rosuvastatin  (CRESTOR ) 10 MG tablet TAKE 1 TABLET BY MOUTH EVERY DAY 90 tablet 3   TRELEGY ELLIPTA  100-62.5-25 MCG/ACT AEPB TAKE 1 PUFF BY MOUTH EVERY DAY 60 each 11   valsartan -hydrochlorothiazide  (DIOVAN -HCT) 80-12.5 MG tablet TAKE 1 TABLET BY MOUTH EVERY DAY  90 tablet 3   No facility-administered medications prior to visit.   Review of Systems  Constitutional:  Negative for chills, fever, malaise/fatigue and weight loss.  HENT:  Negative for congestion, sinus pain and sore throat.   Eyes: Negative.   Respiratory:  Positive for cough. Negative for hemoptysis, sputum production, shortness of breath and wheezing.   Cardiovascular:  Negative for chest pain, palpitations, orthopnea, claudication and leg swelling.  Gastrointestinal:  Negative for abdominal pain, heartburn, nausea and vomiting.  Genitourinary: Negative.   Musculoskeletal:  Negative for joint pain and myalgias.  Skin:  Negative for rash.  Neurological:  Positive for dizziness. Negative for weakness.       Brain fog  Endo/Heme/Allergies: Negative.   Psychiatric/Behavioral: Negative.      Objective:   Vitals:   05/30/24 0848  BP: (!) 142/70  Pulse: 75  SpO2: 96%  Weight: 239 lb (108.4 kg)  Height: 5' 11 (1.803 m)    Physical Exam Constitutional:      General: He is not in acute distress.    Appearance: Normal appearance. He is obese.  Eyes:     General: No scleral icterus.    Conjunctiva/sclera: Conjunctivae normal.  Cardiovascular:     Rate and Rhythm: Normal rate and regular rhythm.  Pulmonary:     Breath sounds: No wheezing, rhonchi or rales.  Musculoskeletal:     Right lower leg: No edema.     Left lower leg: No edema.  Skin:    General: Skin is warm and dry.  Neurological:     General: No focal deficit present.    CBC    Component Value Date/Time   WBC 5.5 05/21/2024 1153   RBC 5.14 05/21/2024 1153   HGB 14.3 05/21/2024 1153   HGB 14.3 01/11/2020 1005   HCT 43.3 05/21/2024 1153   HCT 42.4 01/11/2020 1005   PLT 223 05/21/2024 1153   PLT 168 01/11/2020 1005   MCV 84.2 05/21/2024 1153   MCV 86 01/11/2020 1005   MCH 27.8 05/21/2024 1153   MCHC 33.0 05/21/2024 1153   RDW 15.7 (H) 05/21/2024 1153   RDW 14.0 01/11/2020 1005   LYMPHSABS 0.9  03/18/2023 1004   LYMPHSABS 1.1 01/11/2020 1005   MONOABS 0.8 03/18/2023 1004   EOSABS 0.2 03/18/2023 1004   EOSABS 0.2 01/11/2020 1005   BASOSABS 0.1 03/18/2023 1004   BASOSABS 0.1 01/11/2020 1005      Latest Ref Rng & Units 05/21/2024   11:53 AM 03/16/2024    2:13 PM 01/21/2024    9:02 AM  BMP  Glucose 70 - 99 mg/dL 80  887  95   BUN 8 - 23 mg/dL 16  22  23    Creatinine 0.61 - 1.24 mg/dL 9.05  9.04  8.93   Sodium 135 - 145 mmol/L 138  139  140   Potassium 3.5 - 5.1 mmol/L 3.9  3.9  3.9   Chloride 98 - 111 mmol/L 105  106  107   CO2 22 - 32 mmol/L 23  28  25    Calcium  8.9 - 10.3 mg/dL 8.7  9.3  9.1    Chest imaging: HRCT Chest 12/18/23 1. 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 Findings are categorized as probable UIP per consensus guidelines: Diagnosis of Idiopathic Pulmonary Fibrosis: An Official ATS/ERS/JRS/ALAT Clinical Practice Guideline. Am JINNY Honey Crit Care Med Vol 198, Iss 5, [id number], May 02 2017. 2. 1.6 cm low-attenuation left thyroid nodule. Recommend thyroid  ultrasound. (Ref: J Am Coll Radiol. 2015 Feb;12(2): 143-50). 3.  Aortic atherosclerosis (ICD10-I70.0). 4. Enlarged right and left pulmonary arteries, indicative of pulmonary arterial hypertension.  CT Chest 04/29/23 1. Lung-RADS 1S, negative. Continue annual screening with low-dose chest CT without contrast in 12 months. 2. The S modifier above refers to potentially clinically significant non lung cancer related findings. Specifically, there is evidence of probable interstitial lung disease. Outpatient referral to Pulmonology for further clinical evaluation is recommended. Follow-up nonemergent high-resolution chest CT should also be considered for further characterization. 3. Mild diffuse bronchial wall thickening with mild centrilobular and paraseptal emphysema; imaging findings suggestive of underlying COPD. 4. Hepatic steatosis  PFT:    Latest  Ref Rng & Units 03/09/2024    9:59 AM  PFT Results  FVC-Pre L 3.93   FVC-Predicted Pre % 90   FVC-Post L 3.93   FVC-Predicted Post % 90   Pre FEV1/FVC % % 82   Post FEV1/FCV % % 84   FEV1-Pre L 3.22   FEV1-Predicted Pre % 103   FEV1-Post L 3.31   DLCO uncorrected ml/min/mmHg 20.50   DLCO UNC% % 80   DLCO corrected ml/min/mmHg 20.56   DLCO COR %Predicted % 80   DLVA Predicted % 86   TLC L 6.05   TLC % Predicted % 83   RV % Predicted % 76   PFTs 03/2024: within normal limits  Labs:  Path:  Echo:  Heart Catheterization:    Assessment & Plan:   Interstitial lung disease (HCC)  Discussion: Yavuz Kirby is a 78 year old male, former smoker with history of GERD, hypertension, seasonal allergies and OSA who returns to pulmonary clinic for emphysema and pulmonary fibrosis.  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 Mild scarring primarily in the lower lobes. Pulmonary function tests normal. Reviewed prognosis and treatment options in detail today. Rheumatologic evaluation not concerning for autoimmune involvement at this time. Smoking history is likely etiology of pulmonary fibrosis. - Will resume antifibrotic therapy in the future once current symptoms of dizziness and brain fog are further evaluated.  Moderate Obstructive Sleep Apnea - previously using oral appliance - Start CPAP therapy with auto-titration 5 -15cmH2O with humidification and mask fitting session. Will likely need full face mask as he sleeps with mouth open - Follow up visit after initiation for compliance review  Insomnia - lunesta - recommended sound machine - reviewed sleep hygiene  Brain Fog Dizziness - Differential includes medication side effects, symptoms of untreated sleep apnea, symptoms of insomnia and poor sleep, vs underlying neurologic disorder - he has consult with neurology in near  future - will monitor effects of CPAP therapy as above - Would avoid sedating medications moving forward to avoid side effects  50 minutes spent on this visit for direct patient care, record review, discussion of plan/diagnoses and completion of orders/documentation.  Follow up in 3 months   Dorn Chill, MD [facility] Office: [phone]   Current Outpatient Medications:    acetaminophen  (TYLENOL ) 500 MG tablet, Take 500 mg by mouth every 6 (six) hours as needed for moderate pain (pain score 4-6) (prn for pain)., Disp: , Rfl:    albuterol  (VENTOLIN  HFA) 108 (90 Base) MCG/ACT inhaler, Inhale 2 puffs into the lungs every 6 (six) hours as needed for wheezing or shortness of breath., Disp: 8 g, Rfl: 6   eszopiclone (LUNESTA) 2 MG TABS tablet, Take 1 mg by mouth at  bedtime., Disp: , Rfl:    gabapentin  (NEURONTIN ) 300 MG capsule, Take 1 capsule (300 mg total) by mouth 2 (two) times daily., Disp: 180 capsule, Rfl: 1   hydrOXYzine  (ATARAX ) 25 MG tablet, Take 1 tablet (25 mg total) by mouth at bedtime as needed for anxiety., Disp: 30 tablet, Rfl: 3   L-Theanine 200 MG CAPS, Take 200 mg by mouth daily., Disp: , Rfl:    lansoprazole  (PREVACID ) 30 MG capsule, TAKE 1 CAPSULE (30 MG TOTAL) BY MOUTH 2 (TWO) TIMES DAILY BEFORE A MEAL., Disp: 180 capsule, Rfl: 1   Milk Thistle 1000 MG CAPS, Take 1,000 mg by mouth daily., Disp: , Rfl:    Multiple Vitamin (MULTIVITAMIN) tablet, Take 1 tablet by mouth daily., Disp: , Rfl:    Nintedanib  (OFEV ) 100 MG CAPS, Take 1 capsule (100 mg total) by mouth 2 (two) times daily., Disp: 60 capsule, Rfl: 2   OVER THE COUNTER MEDICATION, Take 1 tablet by mouth daily. Calm Aid, Disp: , Rfl:    rosuvastatin  (CRESTOR ) 10 MG tablet, TAKE 1 TABLET BY MOUTH EVERY DAY, Disp: 90 tablet, Rfl: 3   TRELEGY ELLIPTA  100-62.5-25 MCG/ACT AEPB, TAKE 1 PUFF BY MOUTH EVERY DAY, Disp: 60 each, Rfl: 11   valsartan -hydrochlorothiazide  (DIOVAN -HCT) 80-12.5 MG tablet,  TAKE 1 TABLET BY MOUTH EVERY DAY, Disp: 90 tablet, Rfl: 3

## 2024-05-30 NOTE — Patient Instructions (Addendum)
 Discuss with your medication Administrator about considering mirtazipine  Consider [employer], available at [employer]  We will order you auto CPAP 5-15 with humidification and mask fitting session  Ok to get influenza, covid and RSV vaccines  Follow up in 4 months, will plan to resume Ofev  in the future when tolerating CPAP and sleeping better.

## 2024-05-30 NOTE — Telephone Encounter (Signed)
-----   Message from [place] sent at 05/30/2024  9:59 AM EDT ----- Regarding: Lab Appt At check out I reminded patient of upcoming labs on 06/06/2024 for ofev  lfts, but patient asked whether this appt was still needed or not, due to he has since stopped the medication after it was made.   Please advise. I will be happy to reach back out to him to notify or cancel lab.

## 2024-05-30 NOTE — Telephone Encounter (Signed)
 No need for Labs he  stopped talking ofev 

## 2024-05-31 ENCOUNTER — Telehealth: Payer: Self-pay | Admitting: Neurology

## 2024-05-31 ENCOUNTER — Encounter: Payer: Self-pay | Admitting: Emergency Medicine

## 2024-05-31 ENCOUNTER — Ambulatory Visit (HOSPITAL_COMMUNITY)
Admission: EM | Admit: 2024-05-31 | Discharge: 2024-05-31 | Disposition: A | Discharge status: Home / Self Care | Attending: Psychiatry | Admitting: Psychiatry

## 2024-05-31 ENCOUNTER — Telehealth: Payer: Self-pay | Admitting: Radiology

## 2024-05-31 DIAGNOSIS — Z79899 Other long term (current) drug therapy: Secondary | ICD-10-CM

## 2024-05-31 DIAGNOSIS — F411 Generalized anxiety disorder: Secondary | ICD-10-CM | POA: Diagnosis not present

## 2024-05-31 DIAGNOSIS — G4709 Other insomnia: Secondary | ICD-10-CM | POA: Diagnosis not present

## 2024-05-31 DIAGNOSIS — G4700 Insomnia, unspecified: Secondary | ICD-10-CM | POA: Diagnosis not present

## 2024-05-31 NOTE — Telephone Encounter (Signed)
 Copied from CRM [id number]. Topic: General - Other >> May 31, 2024  8:51 AM Gregg Smith wrote: Reason for CRM: [facility] to Dr. Purcell to coordiante care with this pt.  Reached out to CAL . Pcp was in clinics. Please  call [phone]

## 2024-05-31 NOTE — ED Provider Notes (Signed)
 [facility] Medical Screening Exam  Patient Name: Gregg Smith MRN: [id number] Date of Evaluation: 05/31/24 Chief Complaint:  insomnia Diagnosis:  Final diagnoses:  Other insomnia  Medication course changed    History of Present illness: Gregg Smith is a 78 y.o. male. With a history of insomnia currently on lunesta 2 mg hs. according to the patient he is still having insomnia.  When asked if he had spoken to his PCP about the insomnia patient stated yes.  Writer discussed with patient that he needs to go back to his PCP because they already have him on a medication and they can probably try him out on something different.  Review of patient records show that patient is on multiple medications and given his age need to be carefully monitored to prevent medication interaction and polypharmacy.  Patient currently lives with his family, currently retired.  According to him [facility] his [facility]esta.  Writer discussed with patient that he needs to reach back out to the [facility] to see if they can change his medicine.  Writer discussed with patient that at this time we will not be able to change his medication because he has been followed by a provider for insomnia.  A face-to-face evaluation of patient, patient is alert and oriented x 4, speech is clear, maintain eye contact.  Patient does appear to have some brain fog at times is unclear if this is caused by multiple medicine that patient is taking.  However patient denies SI, HI, AVH or paranoia.  Denies alcohol use, denies illicit drug use, denies smoking.  Patient does not appear to be in any immediate distress.  Does not seem to be influenced by internal stimuli.  At this time right we will be referring back patient to his PCP and also the [facility] for management of patient's insomnia medication.  Given patient age and the multiple  medication that patient is taking patient need to be followed by his regular PCP and his regular psych provider.  Patient is advised to call 911 or return to the nearest ED should he experience suicidal thoughts homicidal ideation or hallucination.  Patient verbalized understanding.  Recommend discharge for patient to follow-up with PCP and the [facility] treatment [facility].  Flowsheet Row ED from 05/31/2024 in [facility] from 05/21/2024 in [facility] from 05/08/2024 in [facility] at [facility] RISK CATEGORY No Risk No Risk No Risk    Psychiatric Specialty Exam  Presentation  General Appearance:Casual  Eye Contact:Good  Speech:Clear and Coherent  Speech Volume:Normal  Handedness:Right   Mood and Affect  Mood: Anxious  Affect: Congruent   Thought Process  Thought Processes: Coherent  Descriptions of Associations:Intact  Orientation:Full (Time, Place and Person)  Thought Content:Obsessions    Hallucinations:None  Ideas of Reference:None  Suicidal Thoughts:No  Homicidal Thoughts:No   Sensorium  Memory: Immediate Fair  Judgment: Fair  Insight: Fair   Art therapist  Concentration: Fair  Attention Span: Fair  Recall: [employer] of Knowledge: Fair  Language: Fair   Psychomotor Activity  Psychomotor Activity: Normal   Assets  Assets: Desire for Improvement   Sleep  Sleep: Poor  Number of hours:  5   Physical Exam: Physical Exam HENT:     Head: Normocephalic.     Nose: Nose normal.  Eyes:  Pupils: Pupils are equal, round, and reactive to light.  Cardiovascular:     Rate and Rhythm: Normal rate.  Pulmonary:     Effort: Pulmonary effort is normal.  Musculoskeletal:        General: Normal range of motion.     Cervical back: Normal range of motion.  Neurological:     General: No focal deficit present.     Mental Status: He is alert.   Psychiatric:        Mood and Affect: Mood normal.        Thought Content: Thought content normal.    Review of Systems  Constitutional: Negative.   HENT: Negative.    Eyes: Negative.   Respiratory: Negative.    Cardiovascular: Negative.   Gastrointestinal: Negative.   Genitourinary: Negative.   Musculoskeletal: Negative.   Skin: Negative.   Neurological: Negative.   Psychiatric/Behavioral:  The patient has insomnia.    Blood pressure (!) 144/90, pulse 73, temperature 98.1 F (36.7 C), temperature source Oral, resp. rate 16, SpO2 93%. There is no height or weight on file to calculate BMI.  Musculoskeletal: Strength & Muscle Tone: within normal limits Gait & Station: normal Patient leans: N/A   [facility] MSE Discharge Disposition for Follow up and Recommendations: Based on my evaluation the patient does not appear to have an emergency medical condition and can be discharged with resources and follow up care in outpatient services for Medication Management   Gaither Pouch, NP 05/31/2024, 8:39 PM

## 2024-05-31 NOTE — Telephone Encounter (Signed)
 Called PT Back informed that Since this is new Symptoms Pt would need PCP  to place new referral  for pt to be seen for Brain fog and dizziness ans insomnia.  Pt understood and will get Referral From  PCP

## 2024-05-31 NOTE — Progress Notes (Signed)
   05/31/24 1708  [facility] Triage Screening (Walk-ins at [facility] only)  What Is the Reason for Your Visit/Call Today? Gregg Smith 8Y male presents to [facility] accompanied by his son, voluntarily. PT states he has extreme insomnia and has been taking medication Lunesta for 6 years. This past June, the pt had knee replacement surgery. PT was diagnosed with pulmonary fibrosis in July 2025, beginning of August pt started the medication Ofev , pt states he started to feel dizzy and brain fog. PT states he was diagnosed with Covid early September, and started taking medication Paxlovid . PT stated that his condition started to worsen. PT states that he now feels slight anxiety, fatigue and tightness around his face and mouth; and to some extent feelings of brain fog. PT denies SI, HI, AVH and alcohol and substance use.  How Long Has This Been Causing You Problems? 1-6 months  Have You Recently Had Any Thoughts About Hurting Yourself? No  Are You Planning to Commit Suicide/Harm Yourself At This time? No  Have you Recently Had Thoughts About Hurting Someone [name]? No  Are You Planning To Harm Someone At This Time? No  Physical Abuse Denies  Verbal Abuse Denies  Sexual Abuse Denies  Exploitation of patient/patient's resources Denies  Self-Neglect Denies  Are you currently experiencing any auditory, visual or other hallucinations? No  Have You Used Any Alcohol or Drugs in the Past 24 Hours? No  Do you have any current medical co-morbidities that require immediate attention?  (Sciatica)  Clinician description of patient physical appearance/behavior: calm, cooperative, anxious  What Do You Feel Would Help You the Most Today? Treatment for Depression or other mood problem;Medication(s)  Determination of Need Routine (7 days)  Options For Referral [facility];Medication Management

## 2024-05-31 NOTE — Telephone Encounter (Signed)
 Pt called in regards to another had place a referral for PT to go to [facility], When he called [place] they instructed Pt was established  with our office . Pt has not been seen since 2023 . Pt is still having problem with sleep  Dr. Chalice appt were scheduled until next year  , Scheduled with Harlene

## 2024-05-31 NOTE — Discharge Instructions (Signed)
 Follow-up with [facility] and [facility] medication management

## 2024-06-01 ENCOUNTER — Encounter: Payer: Self-pay | Admitting: Emergency Medicine

## 2024-06-01 ENCOUNTER — Ambulatory Visit: Admitting: Adult Health

## 2024-06-01 NOTE — Telephone Encounter (Deleted)
 Copied from CRM [id number]. Topic: Referral - Status >> Jun 01, 2024 11:06 AM Franky GRADE wrote: Reason for CRM: Patient was referred to Neurology and tried contacting the department to schedule an appointment; however, he was informed that it was sent to [facility] and when he tried to schedule an appointment with them they only had appointments in January. He is concerned because this was a urgent referral and doesn't know what the next steps are. Patient would also like for us  to contact his medications manager, April Nandigam of [facility], at [phone]

## 2024-06-01 NOTE — Telephone Encounter (Unsigned)
 Copied from CRM [id number]. Topic: General - Other >> Jun 01, 2024  9:19 AM Willma SAUNDERS wrote: Reason for CRM: Patients provider April Nadigam from [facility] is requesting a call from patients PCP.   April can be reached at [phone] between 12-2pm

## 2024-06-01 NOTE — Telephone Encounter (Signed)
 Copied from CRM [id number]. Topic: Referral - Status >> Jun 01, 2024 11:06 AM Gregg Smith wrote: Reason for CRM: Patient was referred to Neurology and tried contacting the department to schedule an appointment; however, he was informed that it was sent to [facility] and when he tried to schedule an appointment with them they only had appointments in January. He is concerned because this was a urgent referral and doesn't know what the next steps are. Patient would also like for us  to contact his medications manager, April Nandigam of [facility], at [phone]

## 2024-06-02 ENCOUNTER — Encounter: Payer: Self-pay | Admitting: Emergency Medicine

## 2024-06-02 ENCOUNTER — Ambulatory Visit: Admitting: Emergency Medicine

## 2024-06-02 VITALS — BP 136/80 | HR 72 | Temp 98.4°F | Ht 71.0 in | Wt 237.0 lb

## 2024-06-02 DIAGNOSIS — M48061 Spinal stenosis, lumbar region without neurogenic claudication: Secondary | ICD-10-CM

## 2024-06-02 DIAGNOSIS — G4700 Insomnia, unspecified: Secondary | ICD-10-CM

## 2024-06-02 DIAGNOSIS — R4189 Other symptoms and signs involving cognitive functions and awareness: Secondary | ICD-10-CM

## 2024-06-02 DIAGNOSIS — F411 Generalized anxiety disorder: Secondary | ICD-10-CM

## 2024-06-02 MED ORDER — SERTRALINE HCL 50 MG PO TABS: 50.0000 mg | ORAL_TABLET | Freq: Every day | ORAL | 3 refills | Status: DC

## 2024-06-02 MED ORDER — GABAPENTIN 100 MG PO CAPS: 100.0000 mg | ORAL_CAPSULE | Freq: Two times a day (BID) | ORAL | 1 refills | Status: DC

## 2024-06-02 NOTE — Assessment & Plan Note (Signed)
 Chronic problem affecting quality of life and contributing to present symptoms. Sees a therapist on a regular basis.  Scheduled for a video visit next Sunday. Has occasional panic attacks and Vistaril  helps Recommend to start antianxiety daily medication.  Recommend Zoloft 50 mg daily. Needs to continue follow-up with his psychiatrist.

## 2024-06-02 NOTE — Patient Instructions (Signed)
 Health Maintenance After Age 78 After age 27, you are at a higher risk for certain long-term diseases and infections as well as injuries from falls. Falls are a major cause of broken bones and head injuries in people who are older than age 73. Getting regular preventive care can help to keep you healthy and well. Preventive care includes getting regular testing and making lifestyle changes as recommended by your health care provider. Talk with your health care provider about: Which screenings and tests you should have. A screening is a test that checks for a disease when you have no symptoms. A diet and exercise plan that is right for you. What should I know about screenings and tests to prevent falls? Screening and testing are the best ways to find a health problem early. Early diagnosis and treatment give you the best chance of managing medical conditions that are common after age 90. Certain conditions and lifestyle choices may make you more likely to have a fall. Your health care provider may recommend: Regular vision checks. Poor vision and conditions such as cataracts can make you more likely to have a fall. If you wear glasses, make sure to get your prescription updated if your vision changes. Medicine review. Work with your health care provider to regularly review all of the medicines you are taking, including over-the-counter medicines. Ask your health care provider about any side effects that may make you more likely to have a fall. Tell your health care provider if any medicines that you take make you feel dizzy or sleepy. Strength and balance checks. Your health care provider may recommend certain tests to check your strength and balance while standing, walking, or changing positions. Foot health exam. Foot pain and numbness, as well as not wearing proper footwear, can make you more likely to have a fall. Screenings, including: Osteoporosis screening. Osteoporosis is a condition that causes  the bones to get weaker and break more easily. Blood pressure screening. Blood pressure changes and medicines to control blood pressure can make you feel dizzy. Depression screening. You may be more likely to have a fall if you have a fear of falling, feel depressed, or feel unable to do activities that you used to do. Alcohol  use screening. Using too much alcohol  can affect your balance and may make you more likely to have a fall. Follow these instructions at home: Lifestyle Do not drink alcohol  if: Your health care provider tells you not to drink. If you drink alcohol : Limit how much you have to: 0-1 drink a day for women. 0-2 drinks a day for men. Know how much alcohol  is in your drink. In the [place] one drink equals one 12 oz bottle of beer (355 mL), one 5 oz glass of wine (148 mL), or one 1 oz glass of hard liquor (44 mL). Do not use any products that contain nicotine or tobacco. These products include cigarettes, chewing tobacco, and vaping devices, such as e-cigarettes. If you need help quitting, ask your health care provider. Activity  Follow a regular exercise program to stay fit. This will help you maintain your balance. Ask your health care provider what types of exercise are appropriate for you. If you need a cane or walker, use it as recommended by your health care provider. Wear supportive shoes that have nonskid soles. Safety  Remove any tripping hazards, such as rugs, cords, and clutter. Install safety equipment such as grab bars in bathrooms and safety rails on stairs. Keep rooms and walkways  well-lit. General instructions Talk with your health care provider about your risks for falling. Tell your health care provider if: You fall. Be sure to tell your health care provider about all falls, even ones that seem minor. You feel dizzy, tiredness (fatigue), or off-balance. Take over-the-counter and prescription medicines only as told by your health care provider. These include  supplements. Eat a healthy diet and maintain a healthy weight. A healthy diet includes low-fat dairy products, low-fat (lean) meats, and fiber from whole grains, beans, and lots of fruits and vegetables. Stay current with your vaccines. Schedule regular health, dental, and eye exams. Summary Having a healthy lifestyle and getting preventive care can help to protect your health and wellness after age 15. Screening and testing are the best way to find a health problem early and help you avoid having a fall. Early diagnosis and treatment give you the best chance for managing medical conditions that are more common for people who are older than age 42. Falls are a major cause of broken bones and head injuries in people who are older than age 64. Take precautions to prevent a fall at home. Work with your health care provider to learn what changes you can make to improve your health and wellness and to prevent falls. This information is not intended to replace advice given to you by your health care provider. Make sure you discuss any questions you have with your health care provider. Document Revised: 01/07/2021 Document Reviewed: 01/07/2021 Elsevier Patient Education  2024 [employer].

## 2024-06-02 NOTE — Telephone Encounter (Signed)
 Patient has appointment today. Will inform patient referral will be faxed to [facility]

## 2024-06-02 NOTE — Assessment & Plan Note (Signed)
 Active and affecting quality of life Mental health management discussed Differential diagnosis discussed Lack of quality sleep contributing Insomnia management addressed Hydroxyzine  starting to help Recommend neurology evaluation.  Referral placed today.

## 2024-06-02 NOTE — Assessment & Plan Note (Signed)
 Chronic and intermittent Affecting quality of life Contributing greatly to present symptoms Hydroxyzine  is helping Continues Lunesta 2 mg at bedtime Recommend to continue hydroxyzine  25 mg at bedtime

## 2024-06-02 NOTE — Assessment & Plan Note (Signed)
 Stable.  Considering lumbar surgery Wants to taper off gabapentin .  Wants to start taking less Recommend to reduce dose of gabapentin  to 100 mg twice a day.  New prescription sent to pharmacy of record today.

## 2024-06-02 NOTE — Progress Notes (Signed)
 Clem JAYSON Nones 78 y.o.   Chief Complaint  Patient presents with   Insomnia    Patient here for insomnia, brain fog, dizziness, anxiety; and my medications he wants to go over today. He is seeing a therapist first appt will be Sunday.     HISTORY OF PRESENT ILLNESS: This is a 78 y.o. male here for follow-up of office visit on 05/23/2024 when he presented with 2 main complaints: Brain fog and unable to sleep.  Slept well last night and the night before. Has feelings of intermittent sadness during the day but he is able to get out of these. Occasionally has panic attacks and takes Vistaril  which he says helps. He was referred to neurology for evaluation of brain fog during her last visit. He also goes to the mood treatment center where he is prescribed Lunesta. Does not feel depressed but at times feels anxious.  Has been on medication before with success.  Presently not on any antianxiety or antidepression medication. Went to behavioral health care center on 05/31/2024.  Assessment and plan as follows:  [facility] Ur[facility] Medical Screening Exam   Patient Name: LESEAN WOOLVERTON MRN: [id number] Date of Evaluation: 05/31/24 Chief Complaint:  insomnia Diagnosis:  Final diagnoses:  Other insomnia  Medication course changed      History of Present illness: NOCHOLAS DAMASO is a 78 y.o. male. With a history of insomnia currently on lunesta 2 mg hs. according to the patient he is still having insomnia.  When asked if he had spoken to his PCP about the insomnia patient stated yes.  Writer discussed with patient that he needs to go back to his PCP because they already have him on a medication and they can probably try him out on something different.  Review of patient records show that patient is on multiple medications and given his age need to be carefully monitored to prevent medication interaction and polypharmacy.  Patient currently lives with his family, currently retired.   According to him he also goes to the mood treatment center that is where they prescribed his Lunesta.  Writer discussed with patient that he needs to reach back out to the mood treatment center to see if they can change his medicine.  Writer discussed with patient that at this time we will not be able to change his medication because he has been followed by a provider for insomnia.   A face-to-face evaluation of patient, patient is alert and oriented x 4, speech is clear, maintain eye contact.  Patient does appear to have some brain fog at times is unclear if this is caused by multiple medicine that patient is taking.  However patient denies SI, HI, AVH or paranoia.  Denies alcohol use, denies illicit drug use, denies smoking.  Patient does not appear to be in any immediate distress.  Does not seem to be influenced by internal stimuli.  At this time right we will be referring back patient to his PCP and also the mood treatment center for management of patient's insomnia medication.  Given patient age and the multiple medication that patient is taking patient need to be followed by his regular PCP and his regular psych provider.   Patient is advised to call 911 or return to the nearest ED should he experience suicidal thoughts homicidal ideation or hallucination.  Patient verbalized understanding.   Recommend discharge for patient to follow-up with PCP and the mood treatment center. Insomnia PMH includes: no depression.  Prior to Admission medications   Medication Sig Start Date End Date Taking? Authorizing Provider  acetaminophen  (TYLENOL ) 500 MG tablet Take 500 mg by mouth every 6 (six) hours as needed for moderate pain (pain score 4-6) (prn for pain).   Yes [provider]  albuterol  (VENTOLIN  HFA) 108 (90 Base) MCG/ACT inhaler Inhale 2 puffs into the lungs every 6 (six) hours as needed for wheezing or shortness of breath. 12/07/23  Yes Kara Dorn NOVAK, MD  eszopiclone (LUNESTA) 2 MG  TABS tablet Take 1 mg by mouth at bedtime. 11/12/22  Yes [provider]  gabapentin  (NEURONTIN ) 100 MG capsule Take 1 capsule (100 mg total) by mouth 2 (two) times daily. 06/02/24  Yes Ithiel Liebler, Emil Schanz, MD  hydrOXYzine  (ATARAX ) 25 MG tablet Take 1 tablet (25 mg total) by mouth at bedtime as needed for anxiety. 05/23/24  Yes Countess Biebel, Emil Schanz, MD  L-Theanine 200 MG CAPS Take 200 mg by mouth daily.   Yes [provider]  lansoprazole  (PREVACID ) 30 MG capsule TAKE 1 CAPSULE (30 MG TOTAL) BY MOUTH 2 (TWO) TIMES DAILY BEFORE A MEAL. 12/09/23  Yes Abran Norleen SAILOR, MD  Milk Thistle 1000 MG CAPS Take 1,000 mg by mouth daily.   Yes [provider]  Multiple Vitamin (MULTIVITAMIN) tablet Take 1 tablet by mouth daily.   Yes [provider]  Nintedanib  (OFEV ) 100 MG CAPS Take 1 capsule (100 mg total) by mouth 2 (two) times daily. 05/04/24  Yes Kara Dorn NOVAK, MD  OVER THE COUNTER MEDICATION Take 1 tablet by mouth daily. Calm Aid   Yes [provider]  rosuvastatin  (CRESTOR ) 10 MG tablet TAKE 1 TABLET BY MOUTH EVERY DAY 07/22/23  Yes Torey Regan, Emil Schanz, MD  sertraline (ZOLOFT) 50 MG tablet Take 1 tablet (50 mg total) by mouth daily. 06/02/24  Yes Dawnya Grams Jose, MD  TRELEGY ELLIPTA  100-62.5-25 MCG/ACT AEPB TAKE 1 PUFF BY MOUTH EVERY DAY 05/07/24  Yes Pearl Bents, Emil Schanz, MD  valsartan -hydrochlorothiazide  (DIOVAN -HCT) 80-12.5 MG tablet TAKE 1 TABLET BY MOUTH EVERY DAY 08/06/23  Yes Hedwig Mcfall, Emil Schanz, MD    Allergies  Allergen Reactions   Penicillins Hives and Rash    Has patient had a PCN reaction causing immediate rash, facial/tongue/throat swelling, SOB or lightheadedness with hypotension:unsure Has patient had a PCN reaction causing severe rash involving mucus membranes or skin necrosis:unsure Has patient had a PCN reaction that required hospitalization:No Has patient had a PCN reaction occurring within the last 10 years:No If all of the above  answers are NO, then may proceed with Cephalosporin use. 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/2023   Chronic pain of right knee 09/21/2023   Centrilobular emphysema (HCC) 06/04/2023   Interstitial lung disease (HCC) 06/04/2023   Chronic cough 02/03/2023   Overactive bladder 09/17/2022   Aortic atherosclerosis 03/17/2022   Acute insomnia 10/23/2021   Chronic left-sided low back pain with left-sided sciatica 03/05/2021   Unilateral primary osteoarthritis, right knee 01/31/2021   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   Scoliosis deformity of spine 04/22/2019   Generalized anxiety disorder    Panic disorder    Insomnia    Spinal stenosis of lumbar region 11/30/2018   GERD (gastroesophageal reflux disease)  08/14/2017   S/P left TKA 08/11/2016   S/P knee replacement 08/11/2016   OSA (obstructive sleep apnea) 03/22/2014   Snoring 05/09/2013   Essential hypertension 02/11/2013   Dyslipidemia 02/11/2013   Arthritis 02/11/2013    Past Medical History:  Diagnosis Date   Allergy    seasonal    Anxiety    Arthritis    Barrett's esophagus    Cataract    Chicken pox    Colon polyps    hyperplastic   Diverticulosis    Emphysema lung (HCC)    GERD (gastroesophageal reflux disease)    Hemorrhoids    Hyperlipidemia    Hypertension    ILD (interstitial lung disease) (HCC)    12/18/23 Mild basilar fibrotic interstitial lung disease, 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   Insomnia    Leg cramps    Measles    Mumps    Obstructive sleep apnea    Pneumonia    Pulmonary fibrosis (HCC)    Sleep apnea    dental device; no longer uses    Past Surgical History:  Procedure Laterality Date    APPENDECTOMY  1957   CATARACT EXTRACTION     COLONOSCOPY     JOINT REPLACEMENT     TONSILLECTOMY     TONSILLECTOMY AND ADENOIDECTOMY  1954   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   Procedure: LEFT TOTAL KNEE ARTHROPLASTY;  Surgeon: Donnice Car, MD;  Location: [facility];  Service: Orthopedics;  Laterality: Left;   TOTAL KNEE ARTHROPLASTY Right 02/03/2024   Procedure: ARTHROPLASTY, KNEE, TOTAL;  Surgeon: Harden Jerona GAILS, MD;  Location: [facility];  Service: Orthopedics;  Laterality: Right;   UPPER GASTROINTESTINAL ENDOSCOPY      Social History   Socioeconomic History   Marital status: Widowed    Spouse name: [name]   Number of children: 1   Years of education: PHD   Highest education level: Doctorate  Occupational History   Occupation: Retired    Comment: Loss adjuster, chartered: [employer]  Tobacco Use   Smoking status: Former    Current packs/day: 0.00    Average packs/day: 1 pack/day for 40.0 years (40.0 ttl pk-yrs)    Types: Cigarettes    Start date: 01/19/1967    Quit date: 01/19/2007    Years since quitting: 17.3    Passive exposure: Never   Smokeless tobacco: Never  Vaping Use   Vaping status: Never Used  Substance and Sexual Activity   Alcohol use: Yes    Alcohol/week: 3.0 standard drinks of alcohol    Types: 3 Cans of beer per week   Drug use: No   Sexual activity: Not Currently  Other Topics Concern   Not on file  Social History Narrative   Patient lives alone in his home   Patient is retired   Patient has a Ph.D   Patient has one adult child.   Patient is right-handed.   Patient drinks 1 cup of tea and 1 cup of coffee daily    Social Drivers of Health   Financial Resource Strain: Low Risk  (03/17/2024)   Overall Financial Resource Strain (CARDIA)    Difficulty of Paying Living Expenses: Not hard at all  Food Insecurity: No Food Insecurity (03/17/2024)   Hunger Vital Sign    Worried About Running Out of Food in the Last Year: Never true     Ran Out of Food in the Last Year: Never true  Transportation  Needs: No Transportation Needs (03/17/2024)   PRAPARE - Administrator, Civil Service (Medical): No    Lack of Transportation (Non-Medical): No  Physical Activity: Sufficiently Active (03/17/2024)   Exercise Vital Sign    Days of Exercise per Week: 3 days    Minutes of Exercise per Session: 60 min  Stress: No Stress Concern Present (03/17/2024)   [employer] of Occupational Health - Occupational Stress Questionnaire    Feeling of Stress: Only a little  Social Connections: Moderately Integrated (03/17/2024)   Social Connection and Isolation Panel    Frequency of Communication with Friends and Family: More than three times a week    Frequency of Social Gatherings with Friends and Family: Three times a week    Attends Religious Services: More than 4 times per year    Active Member of Clubs or Organizations: Yes    Attends Banker Meetings: More than 4 times per year    Marital Status: Widowed  Intimate Partner Violence: Not At Risk (03/17/2024)   Humiliation, Afraid, Rape, and Kick questionnaire    Fear of Current or Ex-Partner: No    Emotionally Abused: No    Physically Abused: No    Sexually Abused: No    Family History  Problem Relation Age of Onset   Alzheimer's disease Mother    Cancer Father    Stomach cancer Father    Aortic aneurysm Son    Heart disease Son    Cancer Maternal Grandmother    Heart disease Maternal Grandfather    Stomach cancer Paternal Grandfather    Colon cancer Neg Hx    Colon polyps Neg Hx    Esophageal cancer Neg Hx    Rectal cancer Neg Hx    Pancreatic cancer Neg Hx    Liver cancer Neg Hx      Review of Systems  Constitutional: Negative.  Negative for chills and fever.  HENT: Negative.  Negative for congestion and sore throat.   Eyes:  Negative for blurred vision and double vision.  Respiratory: Negative.  Negative for cough and shortness of breath.    Cardiovascular: Negative.  Negative for chest pain and palpitations.  Gastrointestinal:  Negative for abdominal pain, diarrhea, nausea and vomiting.  Genitourinary: Negative.  Negative for dysuria and hematuria.  Skin: Negative.  Negative for rash.  Neurological:  Negative for dizziness and headaches.  Psychiatric/Behavioral:  Negative for depression and suicidal ideas. The patient has insomnia.     Vitals:   06/02/24 0958  BP: 136/80  Pulse: 72  Temp: 98.4 F (36.9 C)  SpO2: 97%    Physical Exam Vitals reviewed.  Constitutional:      Appearance: Normal appearance.  HENT:     Head: Normocephalic.  Eyes:     Extraocular Movements: Extraocular movements intact.  Cardiovascular:     Rate and Rhythm: Normal rate.  Pulmonary:     Effort: Pulmonary effort is normal.  Skin:    General: Skin is warm and dry.  Neurological:     General: No focal deficit present.     Mental Status: He is alert and oriented to person, place, and time.  Psychiatric:        Mood and Affect: Mood normal.        Behavior: Behavior normal.      ASSESSMENT & PLAN: A total of 42 minutes was spent with the patient and counseling/coordination of care regarding preparing for this visit, review of most recent office visit  notes, review of recent hospital visit notes, diagnosis of generalized anxiety disorder and need for new medication, mental health management, review of multiple chronic medical conditions and their management, review of all medications, review of most recent bloodwork results, review of health maintenance items, education on nutrition, prognosis, documentation, and need for follow up.   Problem List Items Addressed This Visit       Other   Generalized anxiety disorder - Primary   Chronic problem affecting quality of life and contributing to present symptoms. Sees a therapist on a regular basis.  Scheduled for a video visit next Sunday. Has occasional panic attacks and Vistaril   helps Recommend to start antianxiety daily medication.  Recommend Zoloft 50 mg daily. Needs to continue follow-up with his psychiatrist.      Relevant Medications   sertraline (ZOLOFT) 50 MG tablet   Insomnia   Chronic and intermittent Affecting quality of life Contributing greatly to present symptoms Hydroxyzine  is helping Continues Lunesta 2 mg at bedtime Recommend to continue hydroxyzine  25 mg at bedtime      Spinal stenosis of lumbar region   Stable.  Considering lumbar surgery Wants to taper off gabapentin .  Wants to start taking less Recommend to reduce dose of gabapentin  to 100 mg twice a day.  New prescription sent to pharmacy of record today.      Brain fog   Active and affecting quality of life Mental health management discussed Differential diagnosis discussed Lack of quality sleep contributing Insomnia management addressed Hydroxyzine  starting to help Recommend neurology evaluation.  Referral placed today.      Relevant Orders   Ambulatory referral to Neurology   Patient Instructions  Health Maintenance After Age 74 After age 72, you are at a higher risk for certain long-term diseases and infections as well as injuries from falls. Falls are a major cause of broken bones and head injuries in people who are older than age 89. Getting regular preventive care can help to keep you healthy and well. Preventive care includes getting regular testing and making lifestyle changes as recommended by your health care provider. Talk with your health care provider about: Which screenings and tests you should have. A screening is a test that checks for a disease when you have no symptoms. A diet and exercise plan that is right for you. What should I know about screenings and tests to prevent falls? Screening and testing are the best ways to find a health problem early. Early diagnosis and treatment give you the best chance of managing medical conditions that are common after age  44. Certain conditions and lifestyle choices may make you more likely to have a fall. Your health care provider may recommend: Regular vision checks. Poor vision and conditions such as cataracts can make you more likely to have a fall. If you wear glasses, make sure to get your prescription updated if your vision changes. Medicine review. Work with your health care provider to regularly review all of the medicines you are taking, including over-the-counter medicines. Ask your health care provider about any side effects that may make you more likely to have a fall. Tell your health care provider if any medicines that you take make you feel dizzy or sleepy. Strength and balance checks. Your health care provider may recommend certain tests to check your strength and balance while standing, walking, or changing positions. Foot health exam. Foot pain and numbness, as well as not wearing proper footwear, can make you more likely to have  a fall. Screenings, including: Osteoporosis screening. Osteoporosis is a condition that causes the bones to get weaker and break more easily. Blood pressure screening. Blood pressure changes and medicines to control blood pressure can make you feel dizzy. Depression screening. You may be more likely to have a fall if you have a fear of falling, feel depressed, or feel unable to do activities that you used to do. Alcohol use screening. Using too much alcohol can affect your balance and may make you more likely to have a fall. Follow these instructions at home: Lifestyle Do not drink alcohol if: Your health care provider tells you not to drink. If you drink alcohol: Limit how much you have to: 0-1 drink a day for women. 0-2 drinks a day for men. Know how much alcohol is in your drink. In the [place] one drink equals one 12 oz bottle of beer (355 mL), one 5 oz glass of wine (148 mL), or one 1 oz glass of hard liquor (44 mL). Do not use any products that contain nicotine or  tobacco. These products include cigarettes, chewing tobacco, and vaping devices, such as e-cigarettes. If you need help quitting, ask your health care provider. Activity  Follow a regular exercise program to stay fit. This will help you maintain your balance. Ask your health care provider what types of exercise are appropriate for you. If you need a cane or walker, use it as recommended by your health care provider. Wear supportive shoes that have nonskid soles. Safety  Remove any tripping hazards, such as rugs, cords, and clutter. Install safety equipment such as grab bars in bathrooms and safety rails on stairs. Keep rooms and walkways well-lit. General instructions Talk with your health care provider about your risks for falling. Tell your health care provider if: You fall. Be sure to tell your health care provider about all falls, even ones that seem minor. You feel dizzy, tiredness (fatigue), or off-balance. Take over-the-counter and prescription medicines only as told by your health care provider. These include supplements. Eat a healthy diet and maintain a healthy weight. A healthy diet includes low-fat dairy products, low-fat (lean) meats, and fiber from whole grains, beans, and lots of fruits and vegetables. Stay current with your vaccines. Schedule regular health, dental, and eye exams. Summary Having a healthy lifestyle and getting preventive care can help to protect your health and wellness after age 76. Screening and testing are the best way to find a health problem early and help you avoid having a fall. Early diagnosis and treatment give you the best chance for managing medical conditions that are more common for people who are older than age 34. Falls are a major cause of broken bones and head injuries in people who are older than age 56. Take precautions to prevent a fall at home. Work with your health care provider to learn what changes you can make to improve your health and  wellness and to prevent falls. This information is not intended to replace advice given to you by your health care provider. Make sure you discuss any questions you have with your health care provider. Document Revised: 01/07/2021 Document Reviewed: 01/07/2021 Elsevier Patient Education  2024 Elsevier Inc.     Emil Schaumann, MD [facility]

## 2024-06-03 ENCOUNTER — Other Ambulatory Visit: Payer: Self-pay | Admitting: Internal Medicine

## 2024-06-03 DIAGNOSIS — K2270 Barrett's esophagus without dysplasia: Secondary | ICD-10-CM

## 2024-06-03 DIAGNOSIS — K219 Gastro-esophageal reflux disease without esophagitis: Secondary | ICD-10-CM

## 2024-06-03 NOTE — Telephone Encounter (Signed)
 We talked about this extensively yesterday.  We went over the details on how to wean off gabapentin .  He has it right.  I agree with the approach.

## 2024-06-05 DIAGNOSIS — G4700 Insomnia, unspecified: Secondary | ICD-10-CM | POA: Diagnosis not present

## 2024-06-05 DIAGNOSIS — F411 Generalized anxiety disorder: Secondary | ICD-10-CM | POA: Diagnosis not present

## 2024-06-05 DIAGNOSIS — F330 Major depressive disorder, recurrent, mild: Secondary | ICD-10-CM | POA: Diagnosis not present

## 2024-06-06 ENCOUNTER — Encounter: Payer: Self-pay | Admitting: Emergency Medicine

## 2024-06-06 ENCOUNTER — Other Ambulatory Visit

## 2024-06-07 DIAGNOSIS — F411 Generalized anxiety disorder: Secondary | ICD-10-CM | POA: Diagnosis not present

## 2024-06-07 DIAGNOSIS — G4700 Insomnia, unspecified: Secondary | ICD-10-CM | POA: Diagnosis not present

## 2024-06-07 DIAGNOSIS — F339 Major depressive disorder, recurrent, unspecified: Secondary | ICD-10-CM | POA: Diagnosis not present

## 2024-06-08 DIAGNOSIS — Z961 Presence of intraocular lens: Secondary | ICD-10-CM | POA: Diagnosis not present

## 2024-06-08 DIAGNOSIS — H5213 Myopia, bilateral: Secondary | ICD-10-CM | POA: Diagnosis not present

## 2024-06-08 DIAGNOSIS — H04123 Dry eye syndrome of bilateral lacrimal glands: Secondary | ICD-10-CM | POA: Diagnosis not present

## 2024-06-08 DIAGNOSIS — H43813 Vitreous degeneration, bilateral: Secondary | ICD-10-CM | POA: Diagnosis not present

## 2024-06-08 DIAGNOSIS — H524 Presbyopia: Secondary | ICD-10-CM | POA: Diagnosis not present

## 2024-06-09 ENCOUNTER — Ambulatory Visit: Admitting: Pulmonary Disease

## 2024-06-09 ENCOUNTER — Telehealth: Payer: Self-pay | Admitting: *Deleted

## 2024-06-09 NOTE — Telephone Encounter (Signed)
 Copied from CRM [id number]. Topic: Clinical - Order For Equipment >> Jun 03, 2024 10:43 AM Leila C wrote: Reason for CRM: Patient states saw a message on that he has a refill appointment with Dr. Kara, but there's no appointment time. Informed patient, he does not have an active appointment for Dr. Kara. Patient states the after summary 05/30/24: We will order you auto CPAP 5-15 with humidification and mask fitting session. Patient has not heard for [facility] 05/30/24. Please advise and call back [phone].  I called and spoke with the pt  He heard from [employer] last night and they answered his questions  Nothing further needed

## 2024-06-14 DIAGNOSIS — F411 Generalized anxiety disorder: Secondary | ICD-10-CM | POA: Diagnosis not present

## 2024-06-14 DIAGNOSIS — G4700 Insomnia, unspecified: Secondary | ICD-10-CM | POA: Diagnosis not present

## 2024-06-14 DIAGNOSIS — F339 Major depressive disorder, recurrent, unspecified: Secondary | ICD-10-CM | POA: Diagnosis not present

## 2024-06-14 DIAGNOSIS — F330 Major depressive disorder, recurrent, mild: Secondary | ICD-10-CM | POA: Diagnosis not present

## 2024-06-15 ENCOUNTER — Other Ambulatory Visit: Payer: Self-pay | Admitting: Emergency Medicine

## 2024-06-21 DIAGNOSIS — F330 Major depressive disorder, recurrent, mild: Secondary | ICD-10-CM | POA: Diagnosis not present

## 2024-06-22 ENCOUNTER — Encounter: Payer: Self-pay | Admitting: Neurology

## 2024-06-22 ENCOUNTER — Telehealth: Payer: Self-pay | Admitting: Neurology

## 2024-06-22 ENCOUNTER — Ambulatory Visit: Admitting: Neurology

## 2024-06-22 VITALS — BP 131/79 | HR 87 | Ht 71.0 in | Wt 245.0 lb

## 2024-06-22 DIAGNOSIS — R4189 Other symptoms and signs involving cognitive functions and awareness: Secondary | ICD-10-CM

## 2024-06-22 DIAGNOSIS — J8410 Pulmonary fibrosis, unspecified: Secondary | ICD-10-CM | POA: Diagnosis not present

## 2024-06-22 DIAGNOSIS — G3184 Mild cognitive impairment, so stated: Secondary | ICD-10-CM | POA: Diagnosis not present

## 2024-06-22 DIAGNOSIS — F320 Major depressive disorder, single episode, mild: Secondary | ICD-10-CM

## 2024-06-22 NOTE — Patient Instructions (Signed)
 Problems With Thinking and Memory (Mild Neurocognitive Disorder): What to Know Mild neurocognitive disorder, formerly known as mild cognitive impairment, is a disorder where your memory doesn't work as well as it should. It may also affect other mental abilities like thinking, communicating, behavior, and being able to finish tasks. These problems can be noticed and measured. But they usually don't stop you from doing daily activities or living on your own. Mild neurocognitive disorder usually happens after 78 years of age. But it can also happen at younger ages. It's not as serious as major neurocognitive disorder, also known as dementia, but it may be the first sign of it. In general, the symptoms of this condition get worse over time. In rare cases, symptoms can get better. What are the causes? This condition may be caused by: Brain disorders like Alzheimer's disease, Parkinson's disease, and other conditions that slowly damage nerve cells. Diseases that affect the blood vessels in the brain and cause small strokes. Certain infections, like HIV. Traumatic brain injury. Other medical conditions, such as brain tumors, underactive thyroid (hypothyroidism), and not having enough vitamin B12. Using certain drugs or medicines. What increases the risk? Being older than 78 years of age. Being male. Having a lower level of education. Diabetes, high blood pressure, high cholesterol, and other conditions that raise the risk for blood vessel diseases. Untreated or undertreated sleep apnea. Having a certain type of gene that can be inherited, or passed down from parent to child. Long-term health problems like heart disease, lung disease, liver disease, kidney disease, or depression. What are the signs or symptoms? Trouble remembering things. You may: Forget names, phone numbers, or details of recent events. Forget about social events and appointments. Often forget where you put your car keys or other  items. Trouble thinking and solving problems. You may have trouble with complex tasks like: Paying bills. Driving in places you don't know well. Trouble communicating. You may have trouble: Finding the right word or naming an object. Forming a sentence that makes sense. Understanding what you read or hear. Changes in your behavior or personality. When this happens, you may: Lose interest in the things you used to enjoy. Avoid being around people. Get angry more easily than usual. Act before thinking. How is this diagnosed? This condition is diagnosed based on: Your symptoms. Your health care provider may ask you and the people you spend time with, like family and friends, about your symptoms. Memory tests and other tests to check how your brain is working. Your provider may refer you to a provider called a neurologist or a mental health specialist. To try to find out the cause of your condition, your provider may: Get a detailed medical history. Ask about use of alcohol, drugs, and medicines. Do a physical exam. Order blood tests and brain imaging tests. How is this treated? Mild neurocognitive disorder that's caused by medicine use, drug use, infection, or another medical condition may get better when the cause is treated, or when medicines or drugs are stopped. If this disorder has another cause, it usually doesn't improve and may get worse. In these cases, the goal of treatment is to help you manage the symptoms. This may include: Medicines to help with memory and behavior symptoms. Talk therapy. This provides education, emotional support, memory aids, and other ways of making up for problems with mental tasks. Lifestyle changes. These may include: Getting regular exercise. Eating a healthy diet that includes omega-3 fatty acids. Doing things to challenge your thinking  and memory skills. Spending more time being with and talking to other people. Using routines like having regular  times for meals and going to bed. Follow these instructions at home: Eating and drinking  Drink more fluids as told. Eat a healthy diet that includes omega-3 fatty acids. These can be found in: Fish. Nuts. Leafy vegetables. Vegetable oils. If you drink alcohol: Limit how much you have to: 0-1 drink a day if you're male. 0-2 drinks a day if you're male. Know how much alcohol is in your drink. In the [place] one drink is one 12 oz bottle of beer (355 mL), one 5 oz glass of wine (148 mL), or one 1 oz glass of hard liquor (44 mL). Lifestyle  Get regular exercise as told by your provider. Do not smoke, vape, or use nicotine or tobacco. Use healthy ways to manage stress. If you need help managing stress, ask your provider. Keep spending time with other people. Keep your mind active by doing activities you enjoy, like reading or playing games. Make sure you get good sleep at night. These tips can help: Try not to take naps during the day. Keep your bedroom dark and cool. Do not exercise in the few hours before you go to bed. Do not have foods or drinks with caffeine at night. General instructions Take medicines only as told. Your provider may tell you to avoid taking medicines that can affect thinking. These include some medicines for pain or sleeping. Work with your provider to find out: What things you need help with. What your safety needs are. Where to find more information [employer] on Aging: [employer] Contact a health care provider if: You have any new symptoms. Get help right away if: You have new confusion or your confusion gets worse. You act in ways that put you or your family in danger. This information is not intended to replace advice given to you by your health care provider. Make sure you discuss any questions you have with your health care provider. Document Revised: 02/10/2023 Document Reviewed: 02/10/2023 Elsevier Patient Education  2024 Elsevier  Inc.  Management of Memory Problems  There are some general things you can do to help manage your memory problems.  Your memory may not in fact recover, but by using techniques and strategies you will be able to manage your memory difficulties better.  1)  Establish a routine. Try to establish and then stick to a regular routine.  By doing this, you will get used to what to expect and you will reduce the need to rely on your memory.  Also, try to do things at the same time of day, such as taking your medication or checking your calendar first thing in the morning. Think about think that you can do as a part of a regular routine and make a list.  Then enter them into a daily planner to remind you.  This will help you establish a routine.  2)  Organize your environment. Organize your environment so that it is uncluttered.  Decrease visual stimulation.  Place everyday items such as keys or cell phone in the same place every day (ie.  Basket next to front door) Use post it notes with a brief message to yourself (ie. Turn off light, lock the door) Use labels to indicate where things go (ie. Which cupboards are for food, dishes, etc.) Keep a notepad and pen by the telephone to take messages  3)  Memory Aids A diary or  journal/notebook/daily planner Making a list (shopping list, chore list, to do list that needs to be done) Using an alarm as a reminder (kitchen timer or cell phone alarm) Using cell phone to store information (Notes, Calendar, Reminders) Calendar/White board placed in a prominent position Post-it notes  In order for memory aids to be useful, you need to have good habits.  It's no good remembering to make a note in your journal if you don't remember to look in it.  Try setting aside a certain time of day to look in journal.  4)  Improving mood and managing fatigue. There may be other factors that contribute to memory difficulties.  Factors, such as anxiety, depression and tiredness  can affect memory. Regular gentle exercise can help improve your mood and give you more energy. Simple relaxation techniques may help relieve symptoms of anxiety Try to get back to completing activities or hobbies you enjoyed doing in the past. Learn to pace yourself through activities to decrease fatigue. Find out about some local support groups where you can share experiences with others. Try and achieve 7-8 hours of sleep at night.ASSESSMENT AND PLAN :    78 y.o. 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  MOCA 27/ 30    2) family history of AD ,onset in his mother age 30    3) hypoxia at night has been addressed by pulmonology, sedating medications have been addressed by mood disorder center..      Plan : MRI brain with and without , Dementia  panel. ATN.  Dementia risk.    Referral to neuropsychology. , EEG.    RV  prn in 6-12 months.

## 2024-06-22 NOTE — Telephone Encounter (Signed)
MRI order sent to [facility] [phone]

## 2024-06-22 NOTE — Progress Notes (Signed)
 Provider:  Dedra Gores, MD  Primary Care Physician:  Purcell Emil Schanz, MD [address] [place] [address]     Referring Provider: Purcell Emil Schanz, Md [address] [place],  [place] [address]          Chief Complaint according to patient   Patient presents with:                HISTORY OF PRESENT ILLNESS:  Gregg Smith is a 78 y.o. male patient who is here for a new problem, of cognitive concerns ,  06/22/2024 for  OSA follow up after a 2.5 year hiatus.'.  He  had  used a dental device until that time of HST 2023, and finally has seen [facility] , did another sleep test, showing apnea again and started on CPAP.   I am now on CPAP- sees outside sleep physician , due to pulmonary fibrosis. I started getting dizziness.  I just got a CPAP last Thursday, sleeping better now.     Chief concern according to patient :  I had Covid in September this  hear and feel  foggy, forgetful, inattentive, easily distracted.   I see the mood treatment center: Mirtazepine  for sleep.  Klonopin   weaned off and Lunesta in the process of weaning off.   I had memory loss on Ofev  I feel better off the medication.       06/22/2024   11:01 AM  Montreal Cognitive Assessment   Visuospatial/ Executive (0/5) 5  Naming (0/3) 3  Attention: Read list of digits (0/2) 2  Attention: Read list of letters (0/1) 1  Attention: Serial 7 subtraction starting at 100 (0/3) 3  Language: Repeat phrase (0/2) 2  Language : Fluency (0/1) 1  Abstraction (0/2) 2  Delayed Recall (0/5) 2  Orientation (0/6) 6  Total 27        No data to display           Fam Hx : mother had dementia, alzheimer type dementia died at age 8 , first signs at age 109.   Social HX; see previous note  , retired in 2024, Agricultural consultant.   Able to drive, able to converse,  physically able to work.   ADL : all independent, but his son spends much time at his house.   He is widowed and   thrives on company.     Review of Systems: Out of a complete 14 system review, the patient complains of only the following symptoms, and all other reviewed systems are negative.:   GDS 4/ 15 .  See Pulmonary test records for details of sleep.    Social History   Socioeconomic History   Marital status: Widowed    Spouse name: [name]   Number of children: 1   Years of education: PHD   Highest education level: Doctorate  Occupational History   Occupation: Retired    Comment: Loss adjuster, chartered: [employer]  Tobacco Use   Smoking status: Former    Current packs/day: 0.00    Average packs/day: 1 pack/day for 40.0 years (40.0 ttl pk-yrs)    Types: Cigarettes    Start date: 01/19/1967    Quit date: 01/19/2007    Years since quitting: 17.4    Passive exposure: Never   Smokeless tobacco: Never  Vaping Use   Vaping status: Never Used  Substance and Sexual Activity  Alcohol use: Yes    Alcohol/week: 3.0 standard drinks of alcohol    Types: 3 Cans of beer per week   Drug use: No   Sexual activity: Not Currently  Other Topics Concern   Not on file  Social History Narrative   Patient lives alone in his home   Patient is retired   Patient has a Ph.D   Patient has one adult child.   Patient is right-handed.   Patient drinks 1 cup of tea and 1 cup of coffee daily    Social Drivers of Health   Financial Resource Strain: Low Risk  (03/17/2024)   Overall Financial Resource Strain (CARDIA)    Difficulty of Paying Living Expenses: Not hard at all  Food Insecurity: No Food Insecurity (03/17/2024)   Hunger Vital Sign    Worried About Running Out of Food in the Last Year: Never true    Ran Out of Food in the Last Year: Never true  Transportation Needs: No Transportation Needs (03/17/2024)   PRAPARE - Administrator, Civil Service (Medical): No    Lack of Transportation (Non-Medical): No  Physical Activity: Sufficiently Active (03/17/2024)    Exercise Vital Sign    Days of Exercise per Week: 3 days    Minutes of Exercise per Session: 60 min  Stress: No Stress Concern Present (03/17/2024)   [employer] of Occupational Health - Occupational Stress Questionnaire    Feeling of Stress: Only a little  Social Connections: Moderately Integrated (03/17/2024)   Social Connection and Isolation Panel    Frequency of Communication with Friends and Family: More than three times a week    Frequency of Social Gatherings with Friends and Family: Three times a week    Attends Religious Services: More than 4 times per year    Active Member of Clubs or Organizations: Yes    Attends Banker Meetings: More than 4 times per year    Marital Status: Widowed    Family History  Problem Relation Age of Onset   Alzheimer's disease Mother    Cancer Father    Stomach cancer Father    Aortic aneurysm Son    Heart disease Son    Cancer Maternal Grandmother    Heart disease Maternal Grandfather    Stomach cancer Paternal Grandfather    Colon cancer Neg Hx    Colon polyps Neg Hx    Esophageal cancer Neg Hx    Rectal cancer Neg Hx    Pancreatic cancer Neg Hx    Liver cancer Neg Hx     Past Medical History:  Diagnosis Date   Allergy    seasonal    Anxiety    Arthritis    Barrett's esophagus    Cataract    Chicken pox    Colon polyps    hyperplastic   Diverticulosis    Emphysema lung (HCC)    GERD (gastroesophageal reflux disease)    Hemorrhoids    Hyperlipidemia    Hypertension    ILD (interstitial lung disease) (HCC)    12/18/23 Mild basilar fibrotic interstitial lung disease, 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   Procedure: LEFT TOTAL KNEE ARTHROPLASTY;  Surgeon: Donnice Car, MD;  Location: [facility];  Service: Orthopedics;  Laterality: Left;   TOTAL KNEE ARTHROPLASTY Right 02/03/2024   Procedure: ARTHROPLASTY, KNEE, TOTAL;  Surgeon: Harden Jerona GAILS, MD;  Location: [facility] OR;  Service: Orthopedics;  Laterality: Right;   UPPER GASTROINTESTINAL ENDOSCOPY       Current Outpatient Medications on File Prior to Visit  Medication Sig Dispense Refill   acetaminophen  (TYLENOL ) 500 MG tablet Take 500 mg by mouth every 6 (six) hours as needed for moderate pain (pain score 4-6) (prn for pain).     albuterol  (VENTOLIN  HFA) 108 (90 Base) MCG/ACT inhaler Inhale 2 puffs into the lungs every 6 (six) hours as needed for wheezing or shortness of breath. 8 g 6   eszopiclone (LUNESTA) 2 MG TABS tablet Take 1 mg by mouth at bedtime.     gabapentin  (NEURONTIN ) 100 MG capsule Take 1 capsule (100 mg total) by mouth 2 (two) times daily. 180 capsule 1   L-Theanine 200 MG CAPS Take 200 mg by mouth daily.     lansoprazole  (PREVACID ) 30 MG capsule Take 1 capsule (30 mg total) by mouth 2 (two) times daily before a meal. Office visit for further refills 180 capsule 0   Milk Thistle 1000 MG CAPS Take 1,000 mg by mouth daily.     mirtazapine (REMERON) 7.5 MG tablet Take 7.5 mg by mouth at bedtime.     Multiple Vitamin (MULTIVITAMIN) tablet Take 1 tablet by mouth daily.     OVER THE COUNTER MEDICATION Take 1 tablet by mouth daily. Calm Aid     rosuvastatin  (CRESTOR ) 10 MG tablet TAKE 1 TABLET BY MOUTH EVERY DAY 90 tablet 3   TRELEGY ELLIPTA  100-62.5-25 MCG/ACT AEPB TAKE 1 PUFF BY MOUTH EVERY DAY 60 each 11   valsartan -hydrochlorothiazide  (DIOVAN -HCT) 80-12.5 MG tablet TAKE 1 TABLET BY MOUTH EVERY DAY 90 tablet 3   Nintedanib  (OFEV ) 100 MG CAPS Take 1 capsule (100 mg total) by mouth 2 (two) times daily. (Patient not taking:  Reported on 06/22/2024) 60 capsule 2   No current facility-administered medications on file prior to visit.    Allergies  Allergen Reactions   Penicillins Hives and Rash    Has patient had a PCN reaction causing immediate rash, facial/tongue/throat swelling, SOB or lightheadedness with hypotension:unsure Has patient had a PCN reaction causing severe rash involving mucus membranes or skin necrosis:unsure Has patient had a PCN reaction that required hospitalization:No Has patient had a PCN reaction occurring within the last 10 years:No If all of the above answers are NO, then may proceed with Cephalosporin use. Childhood reaction      DIAGNOSTIC DATA (LABS, IMAGING, TESTING) - I reviewed patient records, labs, notes, testing and imaging myself where available.  Lab Results  Component Value Date   WBC 5.5 05/21/2024   HGB 14.3 05/21/2024   HCT 43.3 05/21/2024   MCV 84.2 05/21/2024   PLT 223 05/21/2024      Component Value Date/Time   NA 138 05/21/2024 1153   NA 141 06/12/2023 1513   K 3.9 05/21/2024 1153   CL 105 05/21/2024 1153   CO2 23 05/21/2024 1153   GLUCOSE 80 05/21/2024 1153   BUN 16 05/21/2024 1153   BUN 24 06/12/2023 1513  CREATININE 0.94 05/21/2024 1153   CREATININE 1.04 09/05/2015 1839   CALCIUM  8.7 (L) 05/21/2024 1153   PROT 7.6 05/09/2024 1121   PROT 6.8 08/13/2022 1539   ALBUMIN 4.0 05/09/2024 1121   ALBUMIN 3.9 08/13/2022 1539   AST 31 05/09/2024 1121   ALT 23 05/09/2024 1121   ALKPHOS 87 05/09/2024 1121   BILITOT 0.4 05/09/2024 1121   BILITOT 0.3 08/13/2022 1539   GFRNONAA >60 05/21/2024 1153   GFRNONAA 73 09/05/2015 1839   GFRAA 88 03/06/2020 1012   GFRAA 84 09/05/2015 1839   Lab Results  Component Value Date   CHOL 144 03/18/2023   HDL 39.00 (L) 03/18/2023   LDLCALC 87 03/18/2023   LDLDIRECT 91 02/17/2014   TRIG 89.0 03/18/2023   CHOLHDL 4 03/18/2023   Lab Results  Component Value Date   HGBA1C 5.5 03/18/2023   No results found  for: VITAMINB12 Lab Results  Component Value Date   TSH 0.861 04/12/2019    PHYSICAL EXAM:  Vitals:   06/22/24 1059  BP: 131/79  Pulse: 87   No data found. Body mass index is 34.17 kg/m.   Wt Readings from Last 3 Encounters:  06/22/24 245 lb (111.1 kg)  06/02/24 237 lb (107.5 kg)  05/30/24 239 lb (108.4 kg)     Ht Readings from Last 3 Encounters:  06/22/24 5' 11 (1.803 m)  06/02/24 5' 11 (1.803 m)  05/30/24 5' 11 (1.803 m)      General: The patient is awake, alert and appears not in acute distress and groomed. Head: Normocephalic, atraumatic.  Neck is supple. Nasal airflow patent.   Overbite Dwan is not seen.  Dental status:  biological and implant  Cardiovascular:  Regular rate and cardiac rhythm by pulse, without distended neck veins. Respiratory: no shortness of breath  Skin:  Without evidence of ankle edema, or rash. Trunk: BMI is 34     NEUROLOGIC EXAM: The patient is awake and alert, oriented to place and time.   Memory subjective described as intact.  Attention span & concentration ability appears normal.   Speech is fluent,  without  dysarthria, dysphonia or aphasia.  Mood and affect are appropriate.   Neurological Examination: Mental Status: Intact. Language and speech are normal. No cognitive deficits. Cranial Nerves II-XII: Intact. PERL. EOMI. VFF. No nystagmus.  No facial droop.  No ptosis.  Hearing is grossly intact bilaterally.  The tongue is normal and midline. Motor: Strengths are 5/5 throughout. Muscle bulk and tone are normal. No tremors.  Coordination: No ataxia or dysmetria.  Sensory: Grossly intact throughout to all modalities. Reflexes: Normal and symmetric throughout. No ankle clonus. Babinski's sign is absent bilaterally. Hoffman's sign is absent bilaterally. Gait and Station: Normal. Romberg's sign is absent.   ASSESSMENT AND PLAN :   78 y.o. year old male  here with:    1)  subjective memory impairment.  Seems  to have ben more of a trance state.  MOCA 27/ 30   2) family history of AD ,onset in his mother age 6   3) hypoxia at night has been addressed by pulmonology, sedating medications have been addressed by mood disorder center..    Plan : MRI brain with and without , Dementia  panel. ATN.  Dementia risk.   Referral to neuropsychology. , EEG.   RV  prn in 6-12 months.       I would like to thank Sagardia, Emil Schanz, MD and [facility] [place], [place] [address]  [place],  [place] [address] for allowing me to meet with this pleasant patient.   Sleep Clinic Patients are generally offered input on sleep hygiene, life style changes and how to improve compliance with medical treatment where applicable. Review and reiteration of good sleep hygiene measures is offered to any sleep clinic patient, be it in the first consultation or with any follow up visits.    Any patient with sleepiness should be cautioned not to drive, work at heights, or operate dangerous or heavy equipment when feeling tired or sleepy.      The patient will be seen in follow-up in the sleep clinic at [facility] for discussion of test results, sleep related symptoms and treatment compliance review, further management strategies, etc.   The referring provider will be notified of the test results.   The patient's condition requires frequent monitoring and adjustments in the treatment plan, reflecting the ongoing complexity of care.  This provider is the continuing focal point for all needed services for this condition.  After spending a total time of  50  minutes face to face and time for  history taking, physical and neurologic examination, review of laboratory studies,  personal review of imaging studies, reports and results of other testing and review of referral information / records as far as provided in visit,   Electronically signed by: Dedra Gores, MD 06/22/2024 12:09 PM  [facility] and  [facility] Board certified by The [employer] of Sleep Medicine and Diplomate of the [employer] of Sleep Medicine. Board certified In Neurology through the ABPN, Fellow of the [employer] of Neurology.

## 2024-06-23 ENCOUNTER — Encounter: Payer: Self-pay | Admitting: Pulmonary Disease

## 2024-06-23 DIAGNOSIS — G4700 Insomnia, unspecified: Secondary | ICD-10-CM | POA: Diagnosis not present

## 2024-06-23 DIAGNOSIS — F411 Generalized anxiety disorder: Secondary | ICD-10-CM | POA: Diagnosis not present

## 2024-06-23 DIAGNOSIS — F339 Major depressive disorder, recurrent, unspecified: Secondary | ICD-10-CM | POA: Diagnosis not present

## 2024-06-24 ENCOUNTER — Telehealth: Payer: Self-pay

## 2024-06-24 NOTE — Telephone Encounter (Signed)
 CMN faxed to [facility]

## 2024-06-27 ENCOUNTER — Telehealth: Payer: Self-pay

## 2024-06-27 ENCOUNTER — Encounter: Payer: Self-pay | Admitting: Pulmonary Disease

## 2024-06-27 NOTE — Telephone Encounter (Signed)
 ATC 1x left vm sent ltr

## 2024-06-27 NOTE — Telephone Encounter (Signed)
 Copied from CRM #[id number]. Topic: General - Other >> Jun 23, 2024  1:37 PM Celestine FALCON wrote: Reason for CRM: April an NP that sees the pt for his mental health concerns is requesting a call from Dr. Kara regarding coordinating his care going forward. She stated the pt has been having some concerns and she's requesting a call from Dr. Kara only at [phone] ok to leave a vm.

## 2024-06-28 DIAGNOSIS — F330 Major depressive disorder, recurrent, mild: Secondary | ICD-10-CM | POA: Diagnosis not present

## 2024-06-29 ENCOUNTER — Other Ambulatory Visit

## 2024-06-29 DIAGNOSIS — Z006 Encounter for examination for normal comparison and control in clinical research program: Secondary | ICD-10-CM

## 2024-06-30 ENCOUNTER — Ambulatory Visit: Payer: Self-pay | Admitting: Neurology

## 2024-06-30 DIAGNOSIS — J849 Interstitial pulmonary disease, unspecified: Secondary | ICD-10-CM

## 2024-06-30 DIAGNOSIS — G4700 Insomnia, unspecified: Secondary | ICD-10-CM | POA: Diagnosis not present

## 2024-06-30 DIAGNOSIS — F339 Major depressive disorder, recurrent, unspecified: Secondary | ICD-10-CM | POA: Diagnosis not present

## 2024-06-30 DIAGNOSIS — R4189 Other symptoms and signs involving cognitive functions and awareness: Secondary | ICD-10-CM

## 2024-06-30 DIAGNOSIS — F411 Generalized anxiety disorder: Secondary | ICD-10-CM | POA: Diagnosis not present

## 2024-06-30 DIAGNOSIS — G3184 Mild cognitive impairment, so stated: Secondary | ICD-10-CM

## 2024-06-30 DIAGNOSIS — G9331 Postviral fatigue syndrome: Secondary | ICD-10-CM

## 2024-06-30 LAB — ATN PROFILE
A -- Beta-amyloid 42/40 Ratio: 0.102 — ABNORMAL LOW (ref >0.102)
Beta-amyloid 40: 230.82 pg/mL
Beta-amyloid 42: 23.51 pg/mL
N -- NfL, Plasma: 2.08 pg/mL (ref 0.00–6.04)
T -- p-tau181: 1.23 pg/mL — ABNORMAL HIGH (ref 0.00–0.97)

## 2024-06-30 LAB — COMPREHENSIVE METABOLIC PANEL WITH GFR
ALT: 28 IU/L (ref 0–44)
AST: 36 IU/L (ref 0–40)
Albumin: 4 g/dL (ref 3.8–4.8)
Alkaline Phosphatase: 102 IU/L (ref 47–123)
BUN/Creatinine Ratio: 18 (ref 10–24)
BUN: 19 mg/dL (ref 8–27)
Bilirubin Total: 0.7 mg/dL (ref 0.0–1.2)
CO2: 25 mmol/L (ref 20–29)
Calcium: 8.9 mg/dL (ref 8.6–10.2)
Chloride: 102 mmol/L (ref 96–106)
Creatinine, Ser: 1.06 mg/dL (ref 0.76–1.27)
Globulin, Total: 2.9 g/dL (ref 1.5–4.5)
Glucose: 100 mg/dL — ABNORMAL HIGH (ref 70–99)
Potassium: 4.2 mmol/L (ref 3.5–5.2)
Sodium: 140 mmol/L (ref 134–144)
Total Protein: 6.9 g/dL (ref 6.0–8.5)
eGFR: 72 mL/min/1.73 (ref >59)

## 2024-06-30 LAB — CBC WITH DIFFERENTIAL/PLATELET
Basophils Absolute: 0 x10E3/uL (ref 0.0–0.2)
Basos: 1 %
EOS (ABSOLUTE): 0.1 x10E3/uL (ref 0.0–0.4)
Eos: 2 %
Hematocrit: 45.2 % (ref 37.5–51.0)
Hemoglobin: 14.6 g/dL (ref 13.0–17.7)
Immature Grans (Abs): 0.1 x10E3/uL (ref 0.0–0.1)
Immature Granulocytes: 1 %
Lymphocytes Absolute: 0.8 x10E3/uL (ref 0.7–3.1)
Lymphs: 13 %
MCH: 27.9 pg (ref 26.6–33.0)
MCHC: 32.3 g/dL (ref 31.5–35.7)
MCV: 86 fL (ref 79–97)
Monocytes Absolute: 0.8 x10E3/uL (ref 0.1–0.9)
Monocytes: 14 %
Neutrophils Absolute: 4.2 x10E3/uL (ref 1.4–7.0)
Neutrophils: 69 %
Platelets: 200 x10E3/uL (ref 150–450)
RBC: 5.23 x10E6/uL (ref 4.14–5.80)
RDW: 15 % (ref 11.6–15.4)
WBC: 6 x10E3/uL (ref 3.4–10.8)

## 2024-06-30 LAB — APOE ALZHEIMER'S DISEASE RISK

## 2024-06-30 LAB — TSH+FREE T4
Free T4: 0.93 ng/dL (ref 0.82–1.77)
TSH: 0.738 u[IU]/mL (ref 0.450–4.500)

## 2024-06-30 LAB — PROTEIN ELECTROPHORESIS, SERUM
A/G Ratio: 0.9 (ref 0.7–1.7)
Albumin ELP: 3.3 g/dL (ref 2.9–4.4)
Alpha 1: 0.3 g/dL (ref 0.0–0.4)
Alpha 2: 0.7 g/dL (ref 0.4–1.0)
Beta: 0.9 g/dL (ref 0.7–1.3)
Gamma Globulin: 1.7 g/dL (ref 0.4–1.8)
Globulin, Total: 3.6 g/dL (ref 2.2–3.9)

## 2024-06-30 LAB — RPR: RPR Ser Ql: NONREACTIVE

## 2024-06-30 LAB — METHYLMALONIC ACID, SERUM: Methylmalonic Acid: 108 nmol/L (ref 0–378)

## 2024-06-30 LAB — HIV ANTIBODY (ROUTINE TESTING W REFLEX): HIV Screen 4th Generation wRfx: NONREACTIVE

## 2024-06-30 LAB — ANA W/REFLEX: Anti Nuclear Antibody (ANA): NEGATIVE

## 2024-06-30 LAB — HOMOCYSTEINE: Homocysteine: 14.7 umol/L (ref 0.0–19.2)

## 2024-06-30 LAB — VITAMIN B12: Vitamin B-12: 627 pg/mL (ref 232–1245)

## 2024-06-30 LAB — SEDIMENTATION RATE: Sed Rate: 12 mm/h (ref 0–30)

## 2024-06-30 LAB — HEMOGLOBIN A1C
Est. average glucose Bld gHb Est-mCnc: 114 mg/dL
Hgb A1c MFr Bld: 5.6 % (ref 4.8–5.6)

## 2024-07-01 ENCOUNTER — Ambulatory Visit: Admitting: Neurology

## 2024-07-01 ENCOUNTER — Telehealth: Payer: Self-pay | Admitting: Neurology

## 2024-07-01 DIAGNOSIS — J8410 Pulmonary fibrosis, unspecified: Secondary | ICD-10-CM

## 2024-07-01 DIAGNOSIS — F320 Major depressive disorder, single episode, mild: Secondary | ICD-10-CM

## 2024-07-01 DIAGNOSIS — R4189 Other symptoms and signs involving cognitive functions and awareness: Secondary | ICD-10-CM

## 2024-07-01 DIAGNOSIS — G3184 Mild cognitive impairment, so stated: Secondary | ICD-10-CM

## 2024-07-01 DIAGNOSIS — R4182 Altered mental status, unspecified: Secondary | ICD-10-CM

## 2024-07-01 NOTE — Telephone Encounter (Signed)
 Please advise as I'm unsure as to what he is regarding to?

## 2024-07-01 NOTE — Telephone Encounter (Signed)
 Called April back and was informed that she is not in office currently but left a message with the receptionist to forward to her to give me a direct call back in regards to this patient and his medication

## 2024-07-01 NOTE — Telephone Encounter (Signed)
 Referral for Neuropsychology sent thru EPIC  to [facility]  [facility]  PHONE:[phone]

## 2024-07-04 ENCOUNTER — Telehealth: Payer: Self-pay

## 2024-07-04 ENCOUNTER — Encounter: Payer: Self-pay | Admitting: Radiology

## 2024-07-04 NOTE — Telephone Encounter (Signed)
 Called April back and LVM

## 2024-07-04 NOTE — Telephone Encounter (Signed)
 Copied from CRM #[id number]. Topic: Clinical - Medication Question >> Jul 04, 2024 12:22 PM Viola FALCON wrote: Reason for CRM: April returned Micaiah's phone call from Friday 10/31 - says she always has a hard time speaking with someone from office and would like a call back at [phone]

## 2024-07-05 DIAGNOSIS — F330 Major depressive disorder, recurrent, mild: Secondary | ICD-10-CM | POA: Diagnosis not present

## 2024-07-05 DIAGNOSIS — F411 Generalized anxiety disorder: Secondary | ICD-10-CM | POA: Diagnosis not present

## 2024-07-07 DIAGNOSIS — F339 Major depressive disorder, recurrent, unspecified: Secondary | ICD-10-CM | POA: Diagnosis not present

## 2024-07-07 DIAGNOSIS — G4700 Insomnia, unspecified: Secondary | ICD-10-CM | POA: Diagnosis not present

## 2024-07-07 DIAGNOSIS — F411 Generalized anxiety disorder: Secondary | ICD-10-CM | POA: Diagnosis not present

## 2024-07-07 NOTE — Telephone Encounter (Signed)
 2nd attempt has been made LVM to return call back to the office

## 2024-07-08 ENCOUNTER — Encounter: Payer: Self-pay | Admitting: Neurology

## 2024-07-08 LAB — GENECONNECT MOLECULAR SCREEN: Genetic Analysis Overall Interpretation: NEGATIVE

## 2024-07-08 NOTE — Telephone Encounter (Signed)
3rd attempt to call; no answer

## 2024-07-10 ENCOUNTER — Other Ambulatory Visit: Payer: Self-pay | Admitting: Emergency Medicine

## 2024-07-10 DIAGNOSIS — I10 Essential (primary) hypertension: Secondary | ICD-10-CM

## 2024-07-10 DIAGNOSIS — E785 Hyperlipidemia, unspecified: Secondary | ICD-10-CM

## 2024-07-11 ENCOUNTER — Encounter: Payer: Self-pay | Admitting: Psychology

## 2024-07-11 ENCOUNTER — Ambulatory Visit: Admitting: Orthopedic Surgery

## 2024-07-11 DIAGNOSIS — Z96651 Presence of right artificial knee joint: Secondary | ICD-10-CM | POA: Diagnosis not present

## 2024-07-11 NOTE — Telephone Encounter (Signed)
 Called and left VM.  JD

## 2024-07-12 ENCOUNTER — Encounter: Payer: Self-pay | Admitting: Orthopedic Surgery

## 2024-07-12 DIAGNOSIS — F411 Generalized anxiety disorder: Secondary | ICD-10-CM | POA: Diagnosis not present

## 2024-07-12 NOTE — Procedures (Signed)
    History:  78 year old man with cognitive impairment   EEG classification: Awake and drowsy  Duration: 27 minutes   Technical aspects: This EEG study was done with scalp electrodes positioned according to the 10-20 International system of electrode placement. Electrical activity was reviewed with band pass filter of 1-70Hz , sensitivity of 7 uV/mm, display speed of 67mm/sec with a 60Hz  notched filter applied as appropriate. EEG data were recorded continuously and digitally stored.   Description of the recording: The background rhythms of this recording consists of a fairly well modulated medium amplitude alpha rhythm of 10 Hz that is reactive to eye opening and closure. Present in the anterior head region is a 15-20 Hz beta activity. Photic stimulation was performed, did not show any abnormalities. Hyperventilation was also performed, did not show any abnormalities. Drowsiness was manifested by background fragmentation. No abnormal epileptiform discharges seen during this recording. There was no focal slowing. There were no electrographic seizure identified.   Abnormality: None   Impression: This is a normal awake and drowsy EEG. No evidence of interictal epileptiform discharges. Normal EEGs, however, do not rule out epilepsy.    Shilo Philipson, MD [facility]

## 2024-07-12 NOTE — Progress Notes (Signed)
 Office Visit Note   Patient: Gregg Smith           Date of Birth: [birth year]           MRN: [id number] Visit Date: 07/11/2024              Requested by: Purcell Emil Schanz, MD [address] [place],  [place] [address] PCP: Purcell Emil Schanz, MD  Chief Complaint  Patient presents with   Right Knee - Pain   Left Knee - Pain      HPI: Discussed the use of AI scribe software for clinical note transcription with the patient, who gave verbal consent to proceed.  History of Present Illness Gregg Smith is a 78 year old male who presents with concerns following right total knee arthroplasty.  Approximately five months post right total knee arthroplasty performed on February 03, 2024, he experiences ongoing swelling and a sensation of the knee cap sounding like wood when tapped. Swelling in the knee is managed with knee-high compression socks, which he wears daily, although not yet for the entire day.  He engages in regular walking for exercise but experiences discomfort and pain after about 20 minutes, particularly on the interior side of the knee. He is working on music therapist through exercises learned in physical therapy, such as sitting and standing from a chair, performing these exercises a couple of times a day with fifteen repetitions each time. He is considering returning to the gym to further strengthen his muscles.  His left knee was operated on approximately seven years ago and seemed to recover faster. He wonders if the slower recovery of the right knee is due to age or the condition of the knee prior to surgery.  He has achieved the goals for knee flexion and extension, being able to bend back and straighten the knee to about 180 degrees. He occasionally performs an exercise to maintain full extension by placing his foot up and applying pressure for a few minutes.     Assessment & Plan: Visit Diagnoses: No diagnosis found.  Plan: Assessment and  Plan Assessment & Plan Status post right total knee arthroplasty with postoperative swelling and pain Five months post-surgery with mild swelling and pain after walking. Full extension and flexion to 120 degrees. Normal patellar blotting due to fluid accumulation. Swelling expected to decrease. Muscle soreness from postoperative shutdown requires strengthening to prevent long-term issues and reduce fall risk. - Continue knee-high compression socks daily. - Engage in weight-resistant training for knee muscles. - Perform stretching to maintain full extension. - Gradually increase exercise intensity, monitor for soreness. - Return to gym activities for muscle strength and knee function.  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 - Continue knee-high compression socks daily to manage edema and discoloration.      Follow-Up Instructions: No follow-ups on file.   Ortho Exam  Patient is alert, oriented, no adenopathy, well-dressed, normal affect, normal respiratory effort. Physical Exam MUSCULOSKELETAL: Normal gait. Right knee with full extension and flexion to 120 degrees, mild swelling, and ballotable patella, normal.      Imaging: No results found. No images are attached to the encounter.  Labs: Lab Results  Component Value Date   HGBA1C 5.6 06/22/2024   HGBA1C 5.5 03/18/2023   HGBA1C 5.5 09/17/2022   ESRSEDRATE 12 06/22/2024   LABURIC 6.6 06/12/2023   GRAMSTAIN Rare 08/14/2012   GRAMSTAIN WBC  present-both PMN and Mononuclear 08/14/2012   GRAMSTAIN No Squamous Epithelial Cells Seen 08/14/2012   GRAMSTAIN Few Gram Negative Rods 08/14/2012   GRAMSTAIN Few Gram Positive Cocci In Pairs In Chains 08/14/2012   LABORGA Multiple Organisms Present,None Predominant 08/14/2012     Lab Results  Component Value Date   ALBUMIN 4.0 06/22/2024   ALBUMIN 4.0 05/09/2024   ALBUMIN 3.8  03/16/2024    Lab Results  Component Value Date   MG 2.0 08/13/2022   Lab Results  Component Value Date   VD25OH 38.51 05/21/2024    No results found for: PREALBUMIN    Latest Ref Rng & Units 06/22/2024   12:36 PM 05/21/2024   11:53 AM 01/21/2024    9:02 AM  CBC EXTENDED  WBC 3.4 - 10.8 x10E3/uL 6.0  5.5  6.1   RBC 4.14 - 5.80 x10E6/uL 5.23  5.14  4.99   Hemoglobin 13.0 - 17.7 g/dL 85.3  85.6  85.4   HCT 37.5 - 51.0 % 45.2  43.3  43.8   Platelets 150 - 450 x10E3/uL 200  223  174   NEUT# 1.4 - 7.0 x10E3/uL 4.2     Lymph# 0.7 - 3.1 x10E3/uL 0.8        There is no height or weight on file to calculate BMI.  Orders:  No orders of the defined types were placed in this encounter.  No orders of the defined types were placed in this encounter.    Procedures: No procedures performed  Clinical Data: No additional findings.  ROS:  All other systems negative, except as noted in the HPI. Review of Systems  Objective: Vital Signs: There were no vitals taken for this visit.  Specialty Comments:  No specialty comments available.  PMFS History: Patient Active Problem List   Diagnosis Date Noted   Brain fog 05/23/2024   Obesity, morbid (HCC) 05/05/2024   Chronic venous insufficiency 04/25/2024   Osteoarthritis (arthritis due to wear and tear of joints) 02/03/2024   Osteoarthritis of lumbar spine with myelopathy 11/03/2023   Chronic pain of right knee 09/21/2023   Centrilobular emphysema (HCC) 06/04/2023   Interstitial lung disease (HCC) 06/04/2023   Chronic cough 02/03/2023   Overactive bladder 09/17/2022   Aortic atherosclerosis 03/17/2022   Acute insomnia 10/23/2021   Chronic left-sided low back pain with left-sided sciatica 03/05/2021   Unilateral primary osteoarthritis, right knee 01/31/2021   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   Scoliosis deformity of spine 04/22/2019   Generalized anxiety disorder    Panic disorder    Insomnia     Spinal stenosis of lumbar region 11/30/2018   GERD (gastroesophageal reflux disease) 08/14/2017   S/P left TKA 08/11/2016   S/P knee replacement 08/11/2016   OSA (obstructive sleep apnea) 03/22/2014   Snoring 05/09/2013   Essential hypertension 02/11/2013   Dyslipidemia 02/11/2013   Arthritis 02/11/2013   Past Medical History:  Diagnosis Date   Allergy    seasonal    Anxiety    Arthritis    Barrett's esophagus    Cataract    Chicken pox    Colon polyps    hyperplastic   Diverticulosis    Emphysema lung (HCC)    GERD (gastroesophageal reflux disease)    Hemorrhoids    Hyperlipidemia    Hypertension    ILD (interstitial lung disease) (HCC)    12/18/23 Mild basilar fibrotic interstitial lung disease, 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   Insomnia    Leg cramps  Measles    Mumps    Obstructive sleep apnea    Pneumonia    Pulmonary fibrosis (HCC)    Sleep apnea    dental device; no longer uses    Family History  Problem Relation Age of Onset   Alzheimer's disease Mother    Cancer Father    Stomach cancer Father    Aortic aneurysm Son    Heart disease Son    Cancer Maternal Grandmother    Heart disease Maternal Grandfather    Stomach cancer Paternal Grandfather    Colon cancer Neg Hx    Colon polyps Neg Hx    Esophageal cancer Neg Hx    Rectal cancer Neg Hx    Pancreatic cancer Neg Hx    Liver cancer Neg Hx     Past Surgical History:  Procedure Laterality Date   APPENDECTOMY  1957   CATARACT EXTRACTION     COLONOSCOPY     JOINT REPLACEMENT     TONSILLECTOMY     TONSILLECTOMY AND ADENOIDECTOMY  1954   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   Procedure: LEFT TOTAL KNEE ARTHROPLASTY;  Surgeon: Donnice Car, MD;  Location: [facility];  Service: Orthopedics;  Laterality: Left;   TOTAL KNEE ARTHROPLASTY Right 02/03/2024   Procedure: ARTHROPLASTY, KNEE, TOTAL;  Surgeon: Harden Jerona GAILS, MD;  Location: [facility];  Service: Orthopedics;  Laterality: Right;   UPPER  GASTROINTESTINAL ENDOSCOPY     Social History   Occupational History   Occupation: Retired    Comment: Loss Adjuster, Chartered: [employer]  Tobacco Use   Smoking status: Former    Current packs/day: 0.00    Average packs/day: 1 pack/day for 40.0 years (40.0 ttl pk-yrs)    Types: Cigarettes    Start date: 01/19/1967    Quit date: 01/19/2007    Years since quitting: 17.4    Passive exposure: Never   Smokeless tobacco: Never  Vaping Use   Vaping status: Never Used  Substance and Sexual Activity   Alcohol use: Yes    Alcohol/week: 3.0 standard drinks of alcohol    Types: 3 Cans of beer per week   Drug use: No   Sexual activity: Not Currently

## 2024-07-14 DIAGNOSIS — G4700 Insomnia, unspecified: Secondary | ICD-10-CM | POA: Diagnosis not present

## 2024-07-14 DIAGNOSIS — F411 Generalized anxiety disorder: Secondary | ICD-10-CM | POA: Diagnosis not present

## 2024-07-19 ENCOUNTER — Encounter: Payer: Self-pay | Admitting: Pulmonary Disease

## 2024-07-19 DIAGNOSIS — F330 Major depressive disorder, recurrent, mild: Secondary | ICD-10-CM | POA: Diagnosis not present

## 2024-07-20 NOTE — Telephone Encounter (Signed)
 Please advise on Ofev  dose increase

## 2024-07-26 ENCOUNTER — Ambulatory Visit
Admission: RE | Admit: 2024-07-26 | Discharge: 2024-07-26 | Disposition: A | Discharge status: Home / Self Care | Source: Ambulatory Visit | Attending: Neurology | Admitting: Neurology

## 2024-07-26 DIAGNOSIS — R4189 Other symptoms and signs involving cognitive functions and awareness: Secondary | ICD-10-CM | POA: Diagnosis not present

## 2024-07-26 DIAGNOSIS — G4700 Insomnia, unspecified: Secondary | ICD-10-CM | POA: Diagnosis not present

## 2024-07-26 DIAGNOSIS — G3184 Mild cognitive impairment, so stated: Secondary | ICD-10-CM

## 2024-07-26 DIAGNOSIS — J8410 Pulmonary fibrosis, unspecified: Secondary | ICD-10-CM

## 2024-07-26 DIAGNOSIS — F411 Generalized anxiety disorder: Secondary | ICD-10-CM | POA: Diagnosis not present

## 2024-07-26 DIAGNOSIS — F320 Major depressive disorder, single episode, mild: Secondary | ICD-10-CM

## 2024-07-26 DIAGNOSIS — F339 Major depressive disorder, recurrent, unspecified: Secondary | ICD-10-CM | POA: Diagnosis not present

## 2024-07-26 MED ORDER — GADOPICLENOL 0.5 MMOL/ML IV SOLN
10.0000 mL | Freq: Once | INTRAVENOUS | Status: AC | PRN
  Administered 2024-07-26: 10 mL via INTRAVENOUS

## 2024-07-27 ENCOUNTER — Other Ambulatory Visit: Payer: Self-pay | Admitting: Emergency Medicine

## 2024-07-27 DIAGNOSIS — J8410 Pulmonary fibrosis, unspecified: Secondary | ICD-10-CM

## 2024-07-27 NOTE — Telephone Encounter (Signed)
 Copied from CRM #[id number]. Topic: Clinical - Medication Refill >> Jul 27, 2024 10:01 AM Nathanel DEL wrote: Medication:  Nintedanib  (OFEV ) 100 MG CAPS  Has the patient contacted their pharmacy? Yes This is [facility] [place]  This is the patient's preferred pharmacy:  [facility] - [place], [place] - [address] [place] [address] Phone: [phone] Fax: [phone]  Is this the correct pharmacy for this prescription? Yes If no, delete pharmacy and type the correct one.   Has the prescription been filled recently? Yes  Is the patient out of the medication? No HOWEVER, [facility] needs no later than Monday in order for pt not to be out. (Due to holiday) Has the patient been seen for an appointment in the last year OR does the patient have an upcoming appointment? Yes  Can we respond through MyChart? Yes  Agent: Please be advised that Rx refills may take up to 3 business days. We ask that you follow-up with your pharmacy.

## 2024-07-27 NOTE — Telephone Encounter (Signed)
 Spoke to patient gave MRI Brain results Pt expressed understanding and thanked me for calling

## 2024-07-27 NOTE — Telephone Encounter (Signed)
-----   Message from [place] Dohmeier sent at 07/27/2024  8:56 AM EST ----- MRI scan of the brain with and without contrast showing mild age-related changes of chronic small vessel disease ( expected for age ) and generalized cerebral atrophy ( can be age related brain  shrinkage)   Happy Birthday ! .  ----- Message ----- From: Rosemarie Eather RAMAN, MD Sent: 07/26/2024   5:48 PM EST To: Dedra Gores, MD

## 2024-08-02 MED ORDER — NINTEDANIB ESYLATE 100 MG PO CAPS: 100.0000 mg | ORAL_CAPSULE | Freq: Two times a day (BID) | ORAL | 2 refills | Status: DC

## 2024-08-04 DIAGNOSIS — F411 Generalized anxiety disorder: Secondary | ICD-10-CM | POA: Diagnosis not present

## 2024-08-04 DIAGNOSIS — G4700 Insomnia, unspecified: Secondary | ICD-10-CM | POA: Diagnosis not present

## 2024-08-04 DIAGNOSIS — F339 Major depressive disorder, recurrent, unspecified: Secondary | ICD-10-CM | POA: Diagnosis not present

## 2024-08-04 NOTE — Telephone Encounter (Signed)
 NFN

## 2024-08-05 DIAGNOSIS — R350 Frequency of micturition: Secondary | ICD-10-CM | POA: Diagnosis not present

## 2024-08-05 DIAGNOSIS — N401 Enlarged prostate with lower urinary tract symptoms: Secondary | ICD-10-CM | POA: Diagnosis not present

## 2024-08-05 DIAGNOSIS — R351 Nocturia: Secondary | ICD-10-CM | POA: Diagnosis not present

## 2024-08-05 DIAGNOSIS — R3915 Urgency of urination: Secondary | ICD-10-CM | POA: Diagnosis not present

## 2024-08-08 ENCOUNTER — Ambulatory Visit: Admitting: Pulmonary Disease

## 2024-08-08 ENCOUNTER — Encounter: Payer: Self-pay | Admitting: Pulmonary Disease

## 2024-08-08 VITALS — BP 143/83 | HR 72 | Ht 71.0 in | Wt 251.0 lb

## 2024-08-08 DIAGNOSIS — G4733 Obstructive sleep apnea (adult) (pediatric): Secondary | ICD-10-CM | POA: Diagnosis not present

## 2024-08-08 DIAGNOSIS — J8410 Pulmonary fibrosis, unspecified: Secondary | ICD-10-CM

## 2024-08-08 DIAGNOSIS — G4700 Insomnia, unspecified: Secondary | ICD-10-CM | POA: Diagnosis not present

## 2024-08-08 NOTE — Patient Instructions (Addendum)
 Continue to increase your Ofev  medication over the coming weeks to the goal dose of 150mg  twice daily and monitor for side effects.   We may end up landing on a dose that you tolerate without side effects if you are not able to tolerate the therapeutic dosing.  Continue CPAP nightly - recommend getting fitted for DreamWear Full face mask with hose attachment on top of head  Continue trelegy inhaler 1 puff daily  Follow up in 3 months, call sooner if needed

## 2024-08-08 NOTE — Progress Notes (Signed)
 Established Patient Pulmonology Office Visit   Subjective:  Patient ID: Gregg Benegas., male    DOB: Jun 09, 1946  MRN: [id number]  CC:  Chief Complaint  Patient presents with   Medical Management of Chronic Issues    Pt states restarted gemtesa     Discussed the use of AI scribe software for clinical note transcription with the patient, who gave verbal consent to proceed.  History of Present Illness Gregg Sikorski. is a 78 year old male with emphysema and interstitial lung disease who returns for follow-up on CPAP therapy and medication management.  He was last seen on May 30, 2024. He started CPAP with auto-titration 5 to 15 cm H2O and has used it 100% of nights over the last 30 days with an average of 7 hours 31 minutes per night. He notes mask leak around the mouth with movement during sleep. His residual AHI is 6.8, improved from 15 to 30 per hour.  He started Ofev  for pulmonary fibrosis but developed brain fog and dizziness, so it was held. He restarted at 100 mg once daily and is now at 100 mg twice daily. He had transient dizziness when restarting but no recurrent brain fog or dizziness. He denies significant gastrointestinal symptoms at the current dose.  He is now off gabapentin  and has reduced Lunesta from 2 mg to 0.25 mg. He reports better sleep with CPAP, with quicker sleep onset and less insomnia. He drinks tart cherry juice at night, which he feels helps sleep, and is considering reducing intake due to nocturia.        ROS    Current Outpatient Medications:    acetaminophen  (TYLENOL ) 500 MG tablet, Take 500 mg by mouth every 6 (six) hours as needed for moderate pain (pain score 4-6) (prn for pain)., Disp: , Rfl:    albuterol  (VENTOLIN  HFA) 108 (90 Base) MCG/ACT inhaler, Inhale 2 puffs into the lungs every 6 (six) hours as needed for wheezing or shortness of breath., Disp: 8 g, Rfl: 6   GEMTESA  75 MG TABS, Take 1 tablet by mouth daily., Disp: ,  Rfl:    L-Theanine 200 MG CAPS, Take 200 mg by mouth daily., Disp: , Rfl:    lansoprazole  (PREVACID ) 30 MG capsule, Take 1 capsule (30 mg total) by mouth 2 (two) times daily before a meal. Office visit for further refills, Disp: 180 capsule, Rfl: 0   Milk Thistle 1000 MG CAPS, Take 1,000 mg by mouth daily., Disp: , Rfl:    mirtazapine (REMERON) 7.5 MG tablet, Take 7.5 mg by mouth at bedtime., Disp: , Rfl:    Multiple Vitamin (MULTIVITAMIN) tablet, Take 1 tablet by mouth daily., Disp: , Rfl:    Nintedanib  (OFEV ) 100 MG CAPS, Take 1 capsule (100 mg total) by mouth 2 (two) times daily., Disp: 60 capsule, Rfl: 2   OVER THE COUNTER MEDICATION, Take 1 tablet by mouth daily. Calm Aid, Disp: , Rfl:    rosuvastatin  (CRESTOR ) 10 MG tablet, TAKE 1 TABLET BY MOUTH EVERY DAY, Disp: 90 tablet, Rfl: 3   TRELEGY ELLIPTA  100-62.5-25 MCG/ACT AEPB, TAKE 1 PUFF BY MOUTH EVERY DAY, Disp: 60 each, Rfl: 11   valsartan -hydrochlorothiazide  (DIOVAN -HCT) 80-12.5 MG tablet, TAKE 1 TABLET BY MOUTH EVERY DAY, Disp: 90 tablet, Rfl: 3      Objective:  BP (!) 143/83   Pulse 72   Ht 5' 11 (1.803 m) Comment: per pt  Wt 251 lb (113.9 kg)   SpO2 94%   BMI  35.01 kg/m     Physical Exam Constitutional:      General: He is not in acute distress.    Appearance: Normal appearance.  Eyes:     General: No scleral icterus.    Conjunctiva/sclera: Conjunctivae normal.  Cardiovascular:     Rate and Rhythm: Normal rate and regular rhythm.  Pulmonary:     Breath sounds: Rales (minimal basilar rales) present. No wheezing or rhonchi.  Musculoskeletal:     Right lower leg: No edema.     Left lower leg: No edema.  Skin:    General: Skin is warm and dry.  Neurological:     General: No focal deficit present.      Diagnostic Review:  Last CBC Lab Results  Component Value Date   WBC 6.0 06/22/2024   HGB 14.6 06/22/2024   HCT 45.2 06/22/2024   MCV 86 06/22/2024   MCH 27.9 06/22/2024   RDW 15.0 06/22/2024   PLT 200  06/22/2024   Last metabolic panel Lab Results  Component Value Date   GLUCOSE 100 (H) 06/22/2024   NA 140 06/22/2024   K 4.2 06/22/2024   CL 102 06/22/2024   CO2 25 06/22/2024   BUN 19 06/22/2024   CREATININE 1.06 06/22/2024   EGFR 72 06/22/2024   CALCIUM  8.9 06/22/2024   PROT 6.9 06/22/2024   ALBUMIN 4.0 06/22/2024   LABGLOB 3.6 06/22/2024   LABGLOB 2.9 06/22/2024   AGRATIO 0.9 06/22/2024   BILITOT 0.7 06/22/2024   ALKPHOS 102 06/22/2024   AST 36 06/22/2024   ALT 28 06/22/2024   ANIONGAP 10 05/21/2024   HRCT Chest 12/18/23 1. 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 Findings are categorized as probable UIP per consensus guidelines: Diagnosis of Idiopathic Pulmonary Fibrosis: An Official ATS/ERS/JRS/ALAT Clinical Practice Guideline. Am JINNY Honey Crit Care Med Vol 198, Iss 5, [id number], May 02 2017. 2. 1.6 cm low-attenuation left thyroid nodule. Recommend thyroid ultrasound. (Ref: J Am Coll Radiol. 2015 Feb;12(2): 143-50). 3.  Aortic atherosclerosis (ICD10-I70.0). 4. Enlarged right and left pulmonary arteries, indicative of pulmonary arterial hypertension.     Assessment & Plan:   Assessment & Plan OSA (obstructive sleep apnea)  Orders:   Ambulatory Referral for DME   Assessment and Plan Assessment & Plan Pulmonary Fibrosis Currently on Ofev  100 mg twice daily without recurrence of previous side effects. Tolerating well. Plan to increase dose if tolerated. - Continue Ofev  100 mg twice daily. - Titrate to 150 mg twice daily  - Monitor for side effects such as brain fog and dizziness. - Contact us  based on response to increasing medications  Obstructive sleep apnea on CPAP therapy with mask leak issues CPAP compliance excellent. AHI improved but still elevated. Mask leak issues due to poor fit. - Ordered Dreamwear full face mask to address mask leak issues. - Monitor AHI and mask fit with new mask. - Scheduled  follow-up in three months to assess CPAP efficacy and mask fit.   Insomnia improving with CPAP, tapering off Lunesta Insomnia improving with CPAP. Tapering off Lunesta. Discussed non-pharmacological sleep strategies. - Continue tapering off Lunesta. - Encouraged non-pharmacological strategies for sleep improvement, including physical activity and reducing screen time before bed.      Return in about 3 months (around 11/06/2024) for f/u visit Dr. Kara.   Dorn KATHEE Kara, MD

## 2024-08-08 NOTE — Assessment & Plan Note (Signed)
  Orders:   Ambulatory Referral for DME

## 2024-08-12 ENCOUNTER — Other Ambulatory Visit: Payer: Self-pay | Admitting: Pulmonary Disease

## 2024-08-12 DIAGNOSIS — J432 Centrilobular emphysema: Secondary | ICD-10-CM

## 2024-08-28 ENCOUNTER — Other Ambulatory Visit: Payer: Self-pay | Admitting: Internal Medicine

## 2024-08-28 DIAGNOSIS — K219 Gastro-esophageal reflux disease without esophagitis: Secondary | ICD-10-CM

## 2024-08-28 DIAGNOSIS — K2270 Barrett's esophagus without dysplasia: Secondary | ICD-10-CM

## 2024-08-29 ENCOUNTER — Telehealth: Payer: Self-pay | Admitting: Internal Medicine

## 2024-08-29 DIAGNOSIS — K219 Gastro-esophageal reflux disease without esophagitis: Secondary | ICD-10-CM

## 2024-08-29 DIAGNOSIS — K2270 Barrett's esophagus without dysplasia: Secondary | ICD-10-CM

## 2024-08-29 NOTE — Telephone Encounter (Signed)
 Inbound call from patient stating that he is needing a refill on his lansoprazole . Patient has been schedule with Gregg Smith for [facility] the 2 nd. Please advise.

## 2024-08-30 ENCOUNTER — Telehealth: Payer: Self-pay

## 2024-08-30 DIAGNOSIS — J849 Interstitial pulmonary disease, unspecified: Secondary | ICD-10-CM

## 2024-08-30 MED ORDER — LANSOPRAZOLE 30 MG PO CPDR: 30.0000 mg | DELAYED_RELEASE_CAPSULE | Freq: Two times a day (BID) | ORAL | 1 refills | Status: AC

## 2024-08-30 NOTE — Telephone Encounter (Signed)
 Ok to send in script for 150mg  ofev  tablets  Thanks, JD

## 2024-08-30 NOTE — Telephone Encounter (Signed)
Prevacid refilled 

## 2024-08-31 ENCOUNTER — Other Ambulatory Visit: Payer: Self-pay

## 2024-08-31 DIAGNOSIS — J8410 Pulmonary fibrosis, unspecified: Secondary | ICD-10-CM

## 2024-08-31 DIAGNOSIS — J432 Centrilobular emphysema: Secondary | ICD-10-CM

## 2024-08-31 MED ORDER — OFEV 150 MG PO CAPS: 150.0000 mg | ORAL_CAPSULE | Freq: Two times a day (BID) | ORAL | 0 refills | Status: DC

## 2024-08-31 MED ORDER — OFEV 150 MG PO CAPS: 150.0000 mg | ORAL_CAPSULE | Freq: Two times a day (BID) | ORAL | 2 refills | Status: AC

## 2024-09-05 ENCOUNTER — Encounter: Attending: Psychology | Admitting: Psychology

## 2024-09-05 ENCOUNTER — Encounter: Payer: Self-pay | Admitting: Psychology

## 2024-09-05 DIAGNOSIS — R4189 Other symptoms and signs involving cognitive functions and awareness: Secondary | ICD-10-CM | POA: Diagnosis not present

## 2024-09-05 NOTE — Progress Notes (Signed)
 "  NEUROPSYCHOLOGICAL EVALUATION [facility]  Physical Medicine and Rehabilitation     Patient: Gregg Smith  DOB: 08/03/1946  Age: 79 y.o. Sex: male  Race/Ethnicity: White or Caucasian  Years of Ed.: 20  Referring Provider: Dohmeier, Dedra, MD Provider / Neuropsychologist: Evalene DOROTHA Riff, PsyD  Date of Service: 09/05/24 Start: 8:15 AM End: 10:50 AM Location: [place] [facility]. [facility] Physical Medicine & Rehabilitation Department [phone] [address], [place], [place] [address] Billing Code/Service: [id number] (1 Unit), [phone] (2 Units) 1 hour and 30 minutes spent in face-to-face clinical interview and remaining 65 minutes was spent in record review, documentation, and testing protocol construction.   Individuals Present: The patient was seen by the provider, in-person, in the provider's outpatient office. The patient was unaccompanied.   PATIENT CONSENT AND CONFIDENTIALITY The patient's understanding of the reason for referral was intact. Discussed limits of confidentiality including, but not limited to, posting of final evaluation report in the patient's electronic medical record for both the patient and for the referring provider and appropriate medical professionals. Patient was given the opportunity to have their questions answered. The neuropsychological evaluation process was discussed with the patient and they consented to proceed with the evaluation.  Consent for Evaluation and Treatment: Signed: Yes Explanation of Privacy Policies: Signed: Yes Discussion of Confidentiality Limits: Yes  REASON FOR REFERRAL & RECORD REVIEW The patient was referred for neuropsychological evaluation by neurologist Dr. Chalice due to concerns for cognitive decline. The patient's medical history, per problem list within patient's chart, includes essential hypertension, pulmonary fibrosis, OSA, sciatica, degenerative disc disease, dyslipidemia,  insomnia, left and right TKA, and GAD. He was referred to neurology due to difficulties with Brain fog and seen at [facility] as he previously established with that practice for OSA.   06/22/24 neurology visit notes cognitive difficulties indicated subjective reports of problems with brain fog, forgetfulness, inattention/distractibility. Records indicate he was in the process of weaning off Lunesta, and had already done so for Klonopin . Mirtazapine was prescribed for sleep. Total score on cognitive screener (MoCA) was 27/30, within normal limits. 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 His wife passed around 6-7 years ago. Medical work-up due to cognitive concerns was ordered. Results are summarized below.   The patient indicated that he suspects the transient AMS-type cognitive symptoms he started experiencing mid-2025 were due to medication side effects following his knee replacement surgery. He has since gotten off of gabapentin  (Nov) and Lunesta (Dec), taking Mirtazapine to help with sleep (and is working down on the dose of that as well). The patient indicated that he does still have some concerns regarding cognition, noting some cognitive symptoms present for the last 3-4 years. He indicated some of his concerns relate to family history of AD as well. Insomnia has improved quite a bit. He noted significant improvement in sleep upon starting CPAP in October. He suspects there may be some challenges linked to fatigue and pulmonary fibrosis. He is interested in undergoing the evaluation to help determine whether there are any indications of AD and to learn what might help him maintain his cognitive abilities.    HISTORY OF PRESENTING CONCERNS:  Cognitive Symptom Onset & Course: Transient AMS episodes, described in terms  suggestive of reduced mental clarity and possibly  derealization, along with some difficulty-to-describe subtle negative emotional quality, seem to have improved with medication changes. He does endorse short term memory struggles involving being unsure whether he has already completed a task. This has been present for the last 3-4 years and may be worsening over time.   Subjective Cognitive Complaints:  Memory:  The patient described difficulties remembering whether he has already completed a task, typically tasks that are fairly mundane/routine. He denied any problems remembering conversations or recent events.  He has no difficulties keeping track of future obligations (has utilized calendars/external memory aids for a number of years). He is intact with medications beyond questioning whether he has already taken it.  He denied any problems eating lost in familiar places.   Due to repeated issues with uncertainty as to whether or not he turn things off after leaving the home, he began intentionally taking mental note when completing these things. He indicated this has been helpful for his later recall, suggesting some attentional component.  Processing Speed:  Patient denied noticing clear declines in his processing speed.  He indicated that it takes him a bit longer to get through papers but the number of hours in which she is performing optimally are reduced each day due to fatigue and this may be a factor.  Attention & Concentration: The patient indicated he has had no significant difficulties with attention and concentration since November which was when he discontinued gabapentin .  Language:  Patient: Fairly regular difficulty with word finding (approximately daily in frequency).  No no indications of difficulty in receptive language.  No changes in abilities to do basic mental arithmetic.   Visual-Spatial:  The patient gardens regularly and denied noticing any problems in terms of his  visual-spatial skills within this context.  No difficulties with driving. Executive Functioning:  Patient indicated that he has always been a bit disorganized and that this is not changed from baseline.  He denied any changes in his basic problem-solving abilities.  He denied any clear indications of changes in personality or behavior.    Motor/Sensory Complaints:  Sensory changes: No sudden changes in sense of smell or taste. Smell may have declined slightly over time. No significant concerns with vision. His son reportedly expressed some concerns about hearing loss although no significant difficulties were noted in conversation. (General information regarding importance of treating hear loss discussed with patient) Balance/coordination difficulties: Surgery on the left knee 7+ years ago, surgery on the right last June. Doing fairly well with mobility overall.  Frequent instances of dizziness/vertigo: None.  Other motor difficulties: No tremor or other notable motor difficulties.   Emotional and Behavioral Functioning:  Denied history of prior psychiatric diagnosis or formal treatment. Anxiety and depression is noted in his chart however.  Depression Symptomatology: First onset while in [place] in early adulthood. He indicated suspicion of a seasonal affective disorder exacerbated by limited daylight which is more striking in that region due to it's latitude.  He indicated that he did experience some suicidal ideation at that time and was admitted to an inpatient unit for approximately 10 days.  There was no history of attempt.  This was the clearest indication of a depressive episode described by the patient.  He described periodic feelings of low mood but not lasting significant durations.  He denied any current difficulties with chronic feelings of sadness or low mood and denied any indications of anhedonia. Anxiety Symptomatology: Patient does have some history of anxiety although denied clear  indications of broad-based/generalized  worry.  He did endorse difficulties with relaxing and feelings of tension however.  These issues have been lifelong. Other Symptomatology: No current or past hallucinations, paranoia/delusions, indications of mania/hypomania, homicidal ideation, or other notable psychiatric struggles, although insomnia is notable.   Sleep: Insomnia began around 2020. Contextual factors at that time involved the recent loss of his spouse and medical treatment, of which gabapentin  was included. He indicated he discontinued gabapentin  suddenly and then started having marked insomnia which led to an inpatient behavioral health stay with [facility]. He was placed on medications which helped him sleep, and which he has gradually weaned off over time. More recently, he started CPAP in October which helped dramatically with sleep onset. He indicated he typically wakes only once or twice per night and has no difficulty returning to sleep. He sleeps around 8 hours per night, and feels variably rested in the morning overall. No problems with frequent nightmares.  Denied any signs of possible RBD's, and no concerns expressed by his son who was living there for a period following the knee replacement.   Appetite: Good.  Caffeine: Cup in morning.   Alcohol Use: Infrequent.  Tobacco Use: No. Smoked until around 2000.  Recreational Substance Use: No.    Academic/Vocational History:  No history of learning difficulties or indications of ADHD. A PhD in both philosophy in computer science. 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 He did some teaching in the last two semesters. This is currently the first time he has not been doing teaching and he noted changes in social interaction opportunities, which he recognizes as important for cognitive health.  Wife passed away 6-7 years ago, and  realizes need.  Psychosocial: Marital Status: Widowed; wife passed away 6-7 years ago.    Children/Grandchildren: 1 son, no grandchildren. Living Situation: Alone   Daily Activities/Hobbies: Gardening. Going to [employer] (finds his energy is better later in the day when he exercises). He has interests in learning greek, arabic, and reading philosophical works in radio broadcast assistant. He indicated he has friends in [place] and would like to visit them.   Level of Functional Independence: The patient is basics with basic activities of daily living.  Finances: Got most on auto-payment.    Shopping / Meal Preparation: Intact.    Household Maintenance / Chores: Intact.   Tracking Appointments / Future Obligations: Intact   Medication Management: Intact overall.      Driving: Intact.     Medical History/Record Review: Per records and patient report, History of traumatic brain injury/concussion: No.    History of stroke: No History of heart attack: No History of cancer/chemotherapy: No   History of seizure activity: No Symptoms of chronic pain: Some pain in the morning related to the sciatica, and every once in a while. Not chronic though.    Experience of frequent headaches/migraines: Very mild persistent headache.     Imaging/Lab Results:  07/26/24 MRI of the brain with and without contrast: Per records MRI scan of the brain with and without contrast showing mild age-related changes of chronic small vessel disease ( expected for age ) and generalized cerebral atrophy ( can be age related ) 06/22/2024 ATN profile (A+T+N-) 06/22/2024 APOE: E3/E4  Past Medical History:  Diagnosis Date   Allergy    seasonal    Anxiety    Arthritis    Barrett's esophagus  Cataract    Chicken pox    Colon polyps    hyperplastic   Diverticulosis    Emphysema lung (HCC)    GERD (gastroesophageal reflux disease)    Hemorrhoids    Hyperlipidemia    Hypertension    ILD (interstitial lung disease)  (HCC)    12/18/23 Mild basilar fibrotic interstitial lung disease, 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   Insomnia    Leg cramps    Measles    Mumps    Obstructive sleep apnea    Pneumonia    Pulmonary fibrosis (HCC)    Sleep apnea    dental device; no longer uses   Patient Active Problem List   Diagnosis Date Noted   Brain fog 05/23/2024   Obesity, morbid (HCC) 05/05/2024   Chronic venous insufficiency 04/25/2024   Osteoarthritis (arthritis due to wear and tear of joints) 02/03/2024   Osteoarthritis of lumbar spine with myelopathy 11/03/2023   Chronic pain of right knee 09/21/2023   Centrilobular emphysema (HCC) 06/04/2023   Interstitial lung disease (HCC) 06/04/2023   Chronic cough 02/03/2023   Overactive bladder 09/17/2022   Aortic atherosclerosis 03/17/2022   Acute insomnia 10/23/2021   Chronic left-sided low back pain with left-sided sciatica 03/05/2021   Unilateral primary osteoarthritis, right knee 01/31/2021   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   Scoliosis deformity of spine 04/22/2019   Generalized anxiety disorder    Panic disorder    Insomnia    Spinal stenosis of lumbar region 11/30/2018   GERD (gastroesophageal reflux disease) 08/14/2017   S/P left TKA 08/11/2016   S/P knee replacement 08/11/2016   OSA (obstructive sleep apnea) 03/22/2014   Snoring 05/09/2013   Essential hypertension 02/11/2013   Dyslipidemia 02/11/2013   Arthritis 02/11/2013   Family Neurologic/Medical Hx:  Family History  Problem Relation Age of Onset   Alzheimer's disease Mother    Cancer Father    Stomach cancer Father    Aortic aneurysm Son    Heart disease Son    Cancer Maternal Grandmother    Heart disease Maternal Grandfather    Stomach cancer Paternal Grandfather    Colon cancer Neg Hx    Colon polyps Neg Hx    Esophageal cancer Neg Hx    Rectal cancer Neg Hx    Pancreatic cancer Neg Hx    Liver cancer Neg Hx    Medications:  acetaminophen   (TYLENOL ) 500 MG tablet albuterol  (VENTOLIN  HFA) 108 (90 Base) MCG/ACT inhaler GEMTESA  75 MG TABS L-Theanine 200 MG CAPS lansoprazole  (PREVACID ) 30 MG capsule Milk Thistle 1000 MG CAPS mirtazapine (REMERON) 7.5 MG tablet Multiple Vitamin (MULTIVITAMIN) tablet Nintedanib  (OFEV ) 150 MG CAPS OVER THE COUNTER MEDICATION rosuvastatin  (CRESTOR ) 10 MG tablet TRELEGY ELLIPTA  100-62.5-25 MCG/ACT AEPB valsartan -hydrochlorothiazide  (DIOVAN -HCT) 80-12.5 MG tablet    Mental Status/Behavioral Observations: The patient was seen on an outpatient basis in the [facility] PM&R office for the clinical interview unaccompanied. Sensorium/Arousal: The patient was alert.  Hearing and vision appeared adequate for the purpose of future cognitive testing. Orientation: Intact. Appearance: Appropriate dress and hygiene. Behavior: Cooperative and friendly. Speech/Language: Conversational speech was prosodic, fluent, and well-articulated.  No clear difficulties with receptive language based on behavioral observations and responses to questions. Motor: Ambulated independently and without significant difficulty. Social Comportment: Within normal limits. Mood: Euthymic. Affect: Congruent. Thought Process/Content: No indications of psychosis. Ability to Participate in Interview: Readily answered all questions posed regarding personal history with adequate detail. Insight: Good.  SUMMARY / CLINICAL IMPRESSIONS The  patient was referred for neuropsychological evaluation by neurologist Dr. Chalice due to concerns for cognitive decline. The patient's medical history, per problem list within patient's chart, includes essential hypertension, pulmonary fibrosis, OSA, sciatica, degenerative disc disease, dyslipidemia, insomnia, left and right TKA, and GAD. He was referred to neurology due to difficulties with Brain fog and seen at [facility] as he previously established with that practice for OSA.   06/22/24 neurology visit notes  cognitive difficulties indicated subjective reports of problems with brain fog, forgetfulness, inattention/distractibility. Records indicate he was in the process of weaning off Lunesta, and had already done so for Klonopin . Mirtazapine was prescribed for sleep. Total score on cognitive screener (MoCA) was 27/30, within normal limits. 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 His wife passed around 6-7 years ago. Medical work-up due to cognitive concerns was ordered. Results are summarized below.   The patient indicated that he suspects the transient AMS-type cognitive symptoms he started experiencing mid-2025 were due to medication side effects following his knee replacement surgery. He has since gotten off of gabapentin  (Nov) and Lunesta (Dec), taking Mirtazapine to help with sleep (and is working down on the dose of that as well). The patient indicated that he does still have some concerns regarding cognition, noting some cognitive symptoms present for the last 3-4 years. He indicated some of his concerns relate to family history of AD as well. Insomnia has improved quite a bit. He noted significant improvement in sleep upon starting CPAP in October. He suspects there may be some challenges linked to fatigue and pulmonary fibrosis. He is interested in undergoing the evaluation to help determine whether there are any indications of AD and to learn what might help him maintain his cognitive abilities. Formal neuropsychological evaluation is indicated based on indications of possible cognitive decline, history, and medical workup.   DISPOSITION / PLAN The patient has been set up for a formal neuropsychological assessment to objectively assess her cognitive functioning across domains to establish the patient's cognitive  profile. This data, in conjunction with information obtained via clinical interview and medical record review, will help clarify likely etiology and guide treatment recommendations. Once data collection and interpretation have been completed, the findings / diagnosis and recommendations will be reviewed and discussed with the patient during a feedback appointment with the neuropsychologist. Based on the collaborative dialogue with the patient during the feedback, recommendations may be adjusted / tailored as needed. A formal report will be produced and provided to the patient and the referring provider.   Diagnosis: Cognitive changes   FULL REPORT TO FOLLOW   Evalene DOROTHA Riff, PsyD [facility] PM&R-Clinical Neuropsychology [address] [place], [place] [address] Main: [phone] Fax: [phone] [place] License # [id number]  This report was generated using voice recognition software. While this document has been carefully reviewed, transcription errors may be present. I apologize in advance for any inconvenience. Please contact me if further clarification is needed.  "

## 2024-09-06 ENCOUNTER — Telehealth: Payer: Self-pay

## 2024-09-06 ENCOUNTER — Ambulatory Visit: Admitting: Orthopedic Surgery

## 2024-09-06 ENCOUNTER — Other Ambulatory Visit: Payer: Self-pay

## 2024-09-06 DIAGNOSIS — M545 Low back pain, unspecified: Secondary | ICD-10-CM

## 2024-09-06 NOTE — Progress Notes (Signed)
 Orthopedic Spine Surgery Office Note   Assessment: Patient is a 79 y.o. male with left leg numbness, paresthesias and pain consistent with radiculopathy      Plan: -Patient's symptoms are currently manageable and tolerable.  They are also intermittent in nature so I did not recommend any surgical intervention.  Given his attempt to decrease medication, I do not recommend any medication management at this time.  He can continue with the home exercise program recommended to PT which she has found helpful in the past.  If pain does get worse, discussed laminectomy as an option.  I covered some of the drawbacks of a laminectomy including not addressing the loss of lordosis and likely remaining foraminal stenosis.  However, he did his research and was not interested in indirect decompression if we do ever decide on surgery -Patient has tried PT, Tylenol , gabapentin , chiropractor, lumbar steroid injections -Patient should return to office on an as-needed basis     Patient expressed understanding of the plan and all questions were answered to the patient's satisfaction.    ___________________________________________________________________________     History:   Patient is a 79 y.o. male who presents today to discuss his left leg pain.  Patient states that he has noticed worsening left leg pain particularly in the mornings since he was last in the office.  He states he was on too many medications so he has been decreasing his medications.  1 that he discontinued was gabapentin .  He said that was helping with leg pain so his pain has gotten worse since stopping that.  He notes it mostly in the morning.  He feels it distal to the knee and the leg.  He said it feels like a tightness.  He feels that posteriorly, medially, laterally.  He will notice with activity and at rest.  He does not notice it at all times during the day.  He has no pain on the right side.  He is not having any back pain.  Still has  urinary urgency that started after a prostate surgery.  No recent changes in bowel or bladder habits.  No saddle anesthesia.  He has noticed weakness with the left leg when he is trying to go up the stairs in a tandem fashion.  He has difficulty with supporting his body weight on the left leg to propel up the next step.  Otherwise, he does not notice any weakness in the lower extremities.     Treatments tried: PT, Tylenol , gabapentin , chiropractor, lumbar steroid injections     Physical Exam:   General: no acute distress, appears stated age Neurologic: alert, answering questions appropriately, following commands Respiratory: unlabored breathing on room air, symmetric chest rise Psychiatric: appropriate affect, normal cadence to speech     MSK (spine):   -Strength exam                                                   Left                  Right EHL                              5/5                  5/5 TA  5/5                  5/5 GSC                             5/5                  5/5 Knee extension            5/5                  5/5 Hip flexion                    5/5                  5/5   -Sensory exam                           Sensation intact to light touch in L3-S1 nerve distributions of bilateral lower extremities   -Achilles DTR: 1/4 on the left, 1/4 on the right -Patellar tendon DTR: 1/4 on the left, 1/4 on the right    Imaging: XRs of the lumbar spine from 09/06/2024 were independently reviewed and interpreted, showing disc height loss at multiple levels.  Anterior osteophyte formation at multiple levels.  Loss of lordosis.  Vacuum disc phenomenon at L2/3.  No evidence of instability on flexion/extension views.  No fracture or dislocation seen.  MRI of the lumbar spine from 11/22/2023 was previously independently reviewed and interpreted, showing central and lateral recess stenosis at L2/3 and L3/4.  Left-sided foraminal stenosis at L3/4.   Right-sided foraminal stenosis at L4/5.  Bilateral foraminal stenosis at L5/S1.  DDD at all of the lumbar levels.     Patient name: Gregg Smith Patient MRN: [id number] Date of visit: 09/06/2024  Spent 20 minutes in the room with the patient going over his symptoms.  We also went over his exam and his imaging studies.  I took some time to ask explain treatment options.  He has done well with gabapentin  but does not want to take anymore.  He is interested in decreasing medications.  I told him his remaining options without preference in mind.  He wanted to discuss surgery as an option as well.  We covered indirect and direct decompression as options.  I told him that there were risks with surgery and given how his pain is currently manageable that I am not sure the risks are outweighed by the benefits.  I told him that if the pain does worsen then that may tipped the scales in favor of surgery at which point we could discuss specifics of that operation.

## 2024-09-06 NOTE — Telephone Encounter (Signed)
 Copied from CRM [id number]. Topic: Clinical - Medication Question >> Sep 06, 2024  4:05 PM Benton KIDD wrote: Reason for CRM: [facility] received Nintedanib  (OFEV ) 150 MG CAPS12/31 just wanted to confirm that the dose was increasing and not a mistake because patient did mentioned side affects  The previous prescription was 100 and indicated side affects to the 150  Please give ms bethany a call back concerning prescription  [phone]

## 2024-09-06 NOTE — Telephone Encounter (Signed)
 Called [facility] - resolved.

## 2024-09-12 ENCOUNTER — Ambulatory Visit: Admitting: Pulmonary Disease

## 2024-09-14 ENCOUNTER — Encounter: Payer: Self-pay | Admitting: Emergency Medicine

## 2024-09-14 ENCOUNTER — Ambulatory Visit: Admitting: Emergency Medicine

## 2024-09-14 VITALS — BP 130/70 | HR 82 | Temp 98.0°F | Ht 71.0 in | Wt 254.0 lb

## 2024-09-14 DIAGNOSIS — G8929 Other chronic pain: Secondary | ICD-10-CM | POA: Diagnosis not present

## 2024-09-14 DIAGNOSIS — G4733 Obstructive sleep apnea (adult) (pediatric): Secondary | ICD-10-CM | POA: Diagnosis not present

## 2024-09-14 DIAGNOSIS — K219 Gastro-esophageal reflux disease without esophagitis: Secondary | ICD-10-CM

## 2024-09-14 DIAGNOSIS — J849 Interstitial pulmonary disease, unspecified: Secondary | ICD-10-CM

## 2024-09-14 DIAGNOSIS — F411 Generalized anxiety disorder: Secondary | ICD-10-CM | POA: Diagnosis not present

## 2024-09-14 DIAGNOSIS — I10 Essential (primary) hypertension: Secondary | ICD-10-CM

## 2024-09-14 DIAGNOSIS — E785 Hyperlipidemia, unspecified: Secondary | ICD-10-CM

## 2024-09-14 DIAGNOSIS — J432 Centrilobular emphysema: Secondary | ICD-10-CM

## 2024-09-14 DIAGNOSIS — M5442 Lumbago with sciatica, left side: Secondary | ICD-10-CM

## 2024-09-14 NOTE — Progress Notes (Addendum)
 Gregg Smith Gregg Smith Gregg Smith. 79 y.o.   Chief Complaint  Patient presents with   Follow-up    No concerns     HISTORY OF PRESENT ILLNESS: This is a 79 y.o. male here for follow-up of chronic medical conditions Overall doing well.  Sleeping better. Brain fog is gone No complaints or medical concerns today.  HPI   Prior to Admission medications  Medication Sig Start Date End Date Taking? Authorizing Provider  acetaminophen  (TYLENOL ) 500 MG tablet Take 500 mg by mouth every 6 (six) hours as needed for moderate pain (pain score 4-6) (prn for pain).   Yes [provider]  albuterol  (VENTOLIN  HFA) 108 (90 Base) MCG/ACT inhaler TAKE 2 PUFFS BY MOUTH EVERY 6 HOURS AS NEEDED FOR WHEEZE OR SHORTNESS OF BREATH 08/12/24  Yes Kara Dorn NOVAK, MD  eszopiclone (LUNESTA) 1 MG TABS tablet Take by mouth. 06/28/24  Yes [provider]  GEMTESA  75 MG TABS Take 1 tablet by mouth daily. 08/05/24  Yes [provider]  L-Theanine 200 MG CAPS Take 200 mg by mouth daily.   Yes [provider]  lansoprazole  (PREVACID ) 30 MG capsule Take 1 capsule (30 mg total) by mouth 2 (two) times daily before a meal. Office visit for further refills 08/30/24  Yes Abran Norleen SAILOR, MD  Milk Thistle 1000 MG CAPS Take 1,000 mg by mouth daily.   Yes [provider]  mirtazapine (REMERON) 7.5 MG tablet Take 7.5 mg by mouth at bedtime. 06/07/24  Yes [provider]  Multiple Vitamin (MULTIVITAMIN) tablet Take 1 tablet by mouth daily.   Yes [provider]  Nintedanib  (OFEV ) 150 MG CAPS Take 1 capsule (150 mg total) by mouth 2 (two) times daily. 08/31/24  Yes Kara Dorn NOVAK, MD  OVER THE COUNTER MEDICATION Take 1 tablet by mouth daily. Calm Aid   Yes [provider]  rosuvastatin  (CRESTOR ) 10 MG tablet TAKE 1 TABLET BY MOUTH EVERY DAY 07/10/24  Yes Purcell Emil Schanz, MD  TRELEGY ELLIPTA  100-62.5-25 MCG/ACT AEPB TAKE 1 PUFF BY MOUTH EVERY DAY 05/07/24  Yes  Purcell Emil Schanz, MD  valsartan -hydrochlorothiazide  (DIOVAN -HCT) 80-12.5 MG tablet TAKE 1 TABLET BY MOUTH EVERY DAY 07/10/24  Yes Purcell Emil Schanz, MD    Allergies[1]  Patient Active Problem List   Diagnosis Date Noted   Brain fog 05/23/2024   Obesity, morbid (HCC) 05/05/2024   Chronic venous insufficiency 04/25/2024   Osteoarthritis (arthritis due to wear and tear of joints) 02/03/2024   Osteoarthritis of lumbar spine with myelopathy 11/03/2023   Chronic pain of right knee 09/21/2023   Centrilobular emphysema (HCC) 06/04/2023   Interstitial lung disease (HCC) 06/04/2023   Chronic cough 02/03/2023   Overactive bladder 09/17/2022   Aortic atherosclerosis 03/17/2022   Acute insomnia 10/23/2021   Chronic left-sided low back pain with left-sided sciatica 03/05/2021   Unilateral primary osteoarthritis, right knee 01/31/2021   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   Scoliosis deformity of spine 04/22/2019   Generalized anxiety disorder    Panic disorder    Insomnia    Spinal stenosis of lumbar region 11/30/2018   GERD (gastroesophageal reflux disease) 08/14/2017   S/P left TKA 08/11/2016   S/P knee replacement 08/11/2016   OSA (obstructive sleep apnea) 03/22/2014   Snoring 05/09/2013   Essential hypertension 02/11/2013   Dyslipidemia 02/11/2013   Arthritis 02/11/2013    Past Medical History:  Diagnosis Date   Allergy    seasonal    Anxiety    Arthritis  Barrett's esophagus    Cataract    Chicken pox    Colon polyps    hyperplastic   Diverticulosis    Emphysema lung (HCC)    GERD (gastroesophageal reflux disease)    Hemorrhoids    Hyperlipidemia    Hypertension    ILD (interstitial lung disease) (HCC)    12/18/23 Mild basilar fibrotic interstitial lung disease, 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   Insomnia    Leg cramps    Measles    Mumps    Obstructive sleep apnea    Pneumonia    Pulmonary fibrosis (HCC)    Sleep apnea     dental device; no longer uses    Past Surgical History:  Procedure Laterality Date   APPENDECTOMY  1957   CATARACT EXTRACTION     COLONOSCOPY     JOINT REPLACEMENT     TONSILLECTOMY     TONSILLECTOMY AND ADENOIDECTOMY  1954   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   Procedure: LEFT TOTAL KNEE ARTHROPLASTY;  Surgeon: Donnice Car, MD;  Location: [facility];  Service: Orthopedics;  Laterality: Left;   TOTAL KNEE ARTHROPLASTY Right 02/03/2024   Procedure: ARTHROPLASTY, KNEE, TOTAL;  Surgeon: Harden Jerona GAILS, MD;  Location: [facility];  Service: Orthopedics;  Laterality: Right;   UPPER GASTROINTESTINAL ENDOSCOPY      Social History   Socioeconomic History   Marital status: Widowed    Spouse name: [name]   Number of children: 1   Years of education: PHD   Highest education level: Doctorate  Occupational History   Occupation: Retired    Comment: Loss Adjuster, Chartered: [employer]  Tobacco Use   Smoking status: Former    Current packs/day: 0.00    Average packs/day: 1 pack/day for 40.0 years (40.0 ttl pk-yrs)    Types: Cigarettes    Start date: 01/19/1967    Quit date: 01/19/2007    Years since quitting: 17.6    Passive exposure: Never   Smokeless tobacco: Never  Vaping Use   Vaping status: Never Used  Substance and Sexual Activity   Alcohol use: Yes    Alcohol/week: 3.0 standard drinks of alcohol    Types: 3 Cans of beer per week   Drug use: No   Sexual activity: Not Currently  Other Topics Concern   Not on file  Social History Narrative   Patient lives alone in his home   Patient is retired   Patient has a Ph.D   Patient has one adult child.   Patient is right-handed.   Patient drinks 1 cup of tea and 1 cup of coffee daily    Social Drivers of Health   Tobacco Use: Medium Risk (09/05/2024)   Patient History    Smoking Tobacco Use: Former    Smokeless Tobacco Use: Never    Passive Exposure: Never  Physicist, Medical Strain: Low Risk (03/17/2024)    Overall Financial Resource Strain (CARDIA)    Difficulty of Paying Living Expenses: Not hard at all  Food Insecurity: No Food Insecurity (09/13/2024)   Epic    Worried About Programme Researcher, Broadcasting/film/video in the Last Year: Never true    Ran Out of Food in the Last Year: Never true  Transportation Needs: No Transportation Needs (09/13/2024)   Epic    Lack of Transportation (Medical): No    Lack of Transportation (Non-Medical): No  Physical Activity: Sufficiently Active (09/13/2024)   Exercise Vital Sign  Days of Exercise per Week: 6 days    Minutes of Exercise per Session: 40 min  Stress: No Stress Concern Present (09/13/2024)   [employer] of Occupational Health - Occupational Stress Questionnaire    Feeling of Stress: Only a little  Social Connections: Moderately Integrated (09/13/2024)   Social Connection and Isolation Panel    Frequency of Communication with Friends and Family: More than three times a week    Frequency of Social Gatherings with Friends and Family: More than three times a week    Attends Religious Services: More than 4 times per year    Active Member of [employer] or Organizations: Yes    Attends Banker Meetings: More than 4 times per year    Marital Status: Widowed  Intimate Partner Violence: Not At Risk (03/17/2024)   Epic    Fear of Current or Ex-Partner: No    Emotionally Abused: No    Physically Abused: No    Sexually Abused: No  Depression (PHQ2-9): Low Risk (09/14/2024)   Depression (PHQ2-9)    PHQ-2 Score: 0  Alcohol Screen: Low Risk (09/13/2024)   Alcohol Screen    Last Alcohol Screening Score (AUDIT): 2  Housing: Low Risk (09/13/2024)   Epic    Unable to Pay for Housing in the Last Year: No    Number of Times Moved in the Last Year: 0    Homeless in the Last Year: No  Utilities: Not At Risk (03/17/2024)   Epic    Threatened with loss of utilities: No  Health Literacy: Adequate Health Literacy (03/17/2024)   B1300 Health Literacy    Frequency of  need for help with medical instructions: Never    Family History  Problem Relation Age of Onset   Alzheimer's disease Mother    Cancer Father    Stomach cancer Father    Aortic aneurysm Son    Heart disease Son    Cancer Maternal Grandmother    Heart disease Maternal Grandfather    Stomach cancer Paternal Grandfather    Colon cancer Neg Hx    Colon polyps Neg Hx    Esophageal cancer Neg Hx    Rectal cancer Neg Hx    Pancreatic cancer Neg Hx    Liver cancer Neg Hx      Review of Systems  Constitutional: Negative.  Negative for chills and fever.  HENT: Negative.  Negative for congestion and sore throat.   Respiratory: Negative.  Negative for cough and shortness of breath.   Cardiovascular: Negative.  Negative for chest pain and palpitations.  Gastrointestinal:  Negative for abdominal pain, diarrhea, nausea and vomiting.  Genitourinary: Negative.  Negative for dysuria and hematuria.  Skin: Negative.  Negative for rash.  Neurological: Negative.  Negative for dizziness and headaches.  All other systems reviewed and are negative.   Vitals:   09/14/24 0946 09/14/24 0950  BP: (!) 160/80 (!) 160/80  Pulse: 82   Temp: 98 F (36.7 C)   SpO2: 93%     Physical Exam Vitals reviewed.  Constitutional:      Appearance: Normal appearance.  HENT:     Head: Normocephalic.     Mouth/Throat:     Mouth: Mucous membranes are moist.     Pharynx: Oropharynx is clear.  Eyes:     Extraocular Movements: Extraocular movements intact.     Pupils: Pupils are equal, round, and reactive to light.  Cardiovascular:     Rate and Rhythm: Normal rate and regular rhythm.  Pulses: Normal pulses.     Heart sounds: Normal heart sounds.  Pulmonary:     Effort: Pulmonary effort is normal.     Breath sounds: Normal breath sounds.  Musculoskeletal:     Cervical back: No tenderness.  Lymphadenopathy:     Cervical: No cervical adenopathy.  Skin:    General: Skin is warm and dry.     Capillary  Refill: Capillary refill takes less than 2 seconds.  Neurological:     General: No focal deficit present.     Mental Status: He is alert and oriented to person, place, and time.  Psychiatric:        Mood and Affect: Mood normal.        Behavior: Behavior normal.      ASSESSMENT & PLAN: A total of 42 minutes was spent with the patient and counseling/coordination of care regarding preparing for this visit, review of most recent office visit notes, review of multiple chronic medical conditions and their management, review of all medications, review of most recent bloodwork results, review of health maintenance items, education on nutrition, prognosis, documentation, and need for follow up.   Problem List Items Addressed This Visit       Cardiovascular and Mediastinum   Essential hypertension - Primary   BP Readings from Last 3 Encounters:  09/14/24 130/70  08/08/24 (!) 143/83  06/22/24 131/79  Elevated blood pressure reading in the office but normal readings at home  Continue valsartan  hydrochlorothiazide  80-12.5 mg daily Cardiovascular risks associated with hypertension discussed        Respiratory   OSA (obstructive sleep apnea)   Much improved since starting CPAP treatment Able to sleep and rest better Feels much more refreshed during the day      Centrilobular emphysema (HCC)   Stable chronic condition Continues daily Trelegy      Interstitial lung disease (HCC)   Diagnosed with pulmonary fibrosis. Told his pulmonary function was doing well Continues Trelegy daily and albuterol  as needed Scheduled to follow-up with them soon Started on Ofev  150 mg twice a day Taking it once a day Scheduled to follow-up with pulmonary next Friday        Digestive   GERD (gastroesophageal reflux disease)   Chronic stable condition Recently seen by GI and had upper endoscopy done Continues Prevacid  30 mg daily        Nervous and Auditory   Chronic left-sided low back pain  with left-sided sciatica   Has occasional flareups Taking gabapentin  300 mg twice a day Was seen by orthopedist who is recommending spinal procedure Patient decided against surgery      Relevant Medications   eszopiclone (LUNESTA) 1 MG TABS tablet     Other   Dyslipidemia   Diet and nutrition discussed Continues rosuvastatin  10 mg daily      Generalized anxiety disorder   Stable. Sees psychiatrist on a regular basis      Obesity, morbid (HCC)   Wt Readings from Last 3 Encounters:  09/14/24 254 lb (115.2 kg)  08/08/24 251 lb (113.9 kg)  06/22/24 245 lb (111.1 kg)  Associated with hypertension and dyslipidemia  Diet and nutrition discussed Benefits of exercise discussed         Patient Instructions  Health Maintenance After Age 66 After age 87, you are at a higher risk for certain long-term diseases and infections as well as injuries from falls. Falls are a major cause of broken bones and head injuries in people who are  older than age 33. Getting regular preventive care can help to keep you healthy and well. Preventive care includes getting regular testing and making lifestyle changes as recommended by your health care provider. Talk with your health care provider about: Which screenings and tests you should have. A screening is a test that checks for a disease when you have no symptoms. A diet and exercise plan that is right for you. What should I know about screenings and tests to prevent falls? Screening and testing are the best ways to find a health problem early. Early diagnosis and treatment give you the best chance of managing medical conditions that are common after age 45. Certain conditions and lifestyle choices may make you more likely to have a fall. Your health care provider may recommend: Regular vision checks. Poor vision and conditions such as cataracts can make you more likely to have a fall. If you wear glasses, make sure to get your prescription updated if  your vision changes. Medicine review. Work with your health care provider to regularly review all of the medicines you are taking, including over-the-counter medicines. Ask your health care provider about any side effects that may make you more likely to have a fall. Tell your health care provider if any medicines that you take make you feel dizzy or sleepy. Strength and balance checks. Your health care provider may recommend certain tests to check your strength and balance while standing, walking, or changing positions. Foot health exam. Foot pain and numbness, as well as not wearing proper footwear, can make you more likely to have a fall. Screenings, including: Osteoporosis screening. Osteoporosis is a condition that causes the bones to get weaker and break more easily. Blood pressure screening. Blood pressure changes and medicines to control blood pressure can make you feel dizzy. Depression screening. You may be more likely to have a fall if you have a fear of falling, feel depressed, or feel unable to do activities that you used to do. Alcohol use screening. Using too much alcohol can affect your balance and may make you more likely to have a fall. Follow these instructions at home: Lifestyle Do not drink alcohol if: Your health care provider tells you not to drink. If you drink alcohol: Limit how much you have to: 0-1 drink a day for women. 0-2 drinks a day for men. Know how much alcohol is in your drink. In the [place] one drink equals one 12 oz bottle of beer (355 mL), one 5 oz glass of wine (148 mL), or one 1 oz glass of hard liquor (44 mL). Do not use any products that contain nicotine or tobacco. These products include cigarettes, chewing tobacco, and vaping devices, such as e-cigarettes. If you need help quitting, ask your health care provider. Activity  Follow a regular exercise program to stay fit. This will help you maintain your balance. Ask your health care provider what types  of exercise are appropriate for you. If you need a cane or walker, use it as recommended by your health care provider. Wear supportive shoes that have nonskid soles. Safety  Remove any tripping hazards, such as rugs, cords, and clutter. Install safety equipment such as grab bars in bathrooms and safety rails on stairs. Keep rooms and walkways well-lit. General instructions Talk with your health care provider about your risks for falling. Tell your health care provider if: You fall. Be sure to tell your health care provider about all falls, even ones that seem minor. You feel  dizzy, tiredness (fatigue), or off-balance. Take over-the-counter and prescription medicines only as told by your health care provider. These include supplements. Eat a healthy diet and maintain a healthy weight. A healthy diet includes low-fat dairy products, low-fat (lean) meats, and fiber from whole grains, beans, and lots of fruits and vegetables. Stay current with your vaccines. Schedule regular health, dental, and eye exams. Summary Having a healthy lifestyle and getting preventive care can help to protect your health and wellness after age 73. Screening and testing are the best way to find a health problem early and help you avoid having a fall. Early diagnosis and treatment give you the best chance for managing medical conditions that are more common for people who are older than age 73. Falls are a major cause of broken bones and head injuries in people who are older than age 62. Take precautions to prevent a fall at home. Work with your health care provider to learn what changes you can make to improve your health and wellness and to prevent falls. This information is not intended to replace advice given to you by your health care provider. Make sure you discuss any questions you have with your health care provider. Document Revised: 01/07/2021 Document Reviewed: 01/07/2021 Elsevier Patient Education  2024  Elsevier Inc.      Emil Schaumann, MD [facility]     [1]  Allergies Allergen Reactions   Penicillins Hives and Rash    Has patient had a PCN reaction causing immediate rash, facial/tongue/throat swelling, SOB or lightheadedness with hypotension:unsure Has patient had a PCN reaction causing severe rash involving mucus membranes or skin necrosis:unsure Has patient had a PCN reaction that required hospitalization:No Has patient had a PCN reaction occurring within the last 10 years:No If all of the above answers are NO, then may proceed with Cephalosporin use. Childhood reaction

## 2024-09-14 NOTE — Assessment & Plan Note (Addendum)
 Wt Readings from Last 3 Encounters:  09/14/24 254 lb (115.2 kg)  08/08/24 251 lb (113.9 kg)  06/22/24 245 lb (111.1 kg)  Associated with hypertension and dyslipidemia  Diet and nutrition discussed Benefits of exercise discussed

## 2024-09-14 NOTE — Assessment & Plan Note (Signed)
 Diagnosed with pulmonary fibrosis. Told his pulmonary function was doing well Continues Trelegy daily and albuterol  as needed Scheduled to follow-up with them soon Started on Ofev  150 mg twice a day Taking it once a day Scheduled to follow-up with pulmonary next Friday

## 2024-09-14 NOTE — Assessment & Plan Note (Signed)
Stable.  Sees psychiatrist on a regular basis. 

## 2024-09-14 NOTE — Assessment & Plan Note (Signed)
 Stable chronic condition.   Continues daily Trelegy.

## 2024-09-14 NOTE — Assessment & Plan Note (Signed)
 Has occasional flareups Taking gabapentin  300 mg twice a day Was seen by orthopedist who is recommending spinal procedure Patient decided against surgery

## 2024-09-14 NOTE — Assessment & Plan Note (Signed)
 Chronic stable condition Recently seen by GI and had upper endoscopy done Continues Prevacid 30 mg daily

## 2024-09-14 NOTE — Assessment & Plan Note (Signed)
 BP Readings from Last 3 Encounters:  09/14/24 130/70  08/08/24 (!) 143/83  06/22/24 131/79  Elevated blood pressure reading in the office but normal readings at home  Continue valsartan  hydrochlorothiazide  80-12.5 mg daily Cardiovascular risks associated with hypertension discussed

## 2024-09-14 NOTE — Assessment & Plan Note (Signed)
 Much improved since starting CPAP treatment Able to sleep and rest better Feels much more refreshed during the day

## 2024-09-14 NOTE — Assessment & Plan Note (Signed)
Diet and nutrition discussed Continues rosuvastatin 10 mg daily

## 2024-09-14 NOTE — Patient Instructions (Signed)
 Health Maintenance After Age 79 After age 27, you are at a higher risk for certain long-term diseases and infections as well as injuries from falls. Falls are a major cause of broken bones and head injuries in people who are older than age 73. Getting regular preventive care can help to keep you healthy and well. Preventive care includes getting regular testing and making lifestyle changes as recommended by your health care provider. Talk with your health care provider about: Which screenings and tests you should have. A screening is a test that checks for a disease when you have no symptoms. A diet and exercise plan that is right for you. What should I know about screenings and tests to prevent falls? Screening and testing are the best ways to find a health problem early. Early diagnosis and treatment give you the best chance of managing medical conditions that are common after age 90. Certain conditions and lifestyle choices may make you more likely to have a fall. Your health care provider may recommend: Regular vision checks. Poor vision and conditions such as cataracts can make you more likely to have a fall. If you wear glasses, make sure to get your prescription updated if your vision changes. Medicine review. Work with your health care provider to regularly review all of the medicines you are taking, including over-the-counter medicines. Ask your health care provider about any side effects that may make you more likely to have a fall. Tell your health care provider if any medicines that you take make you feel dizzy or sleepy. Strength and balance checks. Your health care provider may recommend certain tests to check your strength and balance while standing, walking, or changing positions. Foot health exam. Foot pain and numbness, as well as not wearing proper footwear, can make you more likely to have a fall. Screenings, including: Osteoporosis screening. Osteoporosis is a condition that causes  the bones to get weaker and break more easily. Blood pressure screening. Blood pressure changes and medicines to control blood pressure can make you feel dizzy. Depression screening. You may be more likely to have a fall if you have a fear of falling, feel depressed, or feel unable to do activities that you used to do. Alcohol  use screening. Using too much alcohol  can affect your balance and may make you more likely to have a fall. Follow these instructions at home: Lifestyle Do not drink alcohol  if: Your health care provider tells you not to drink. If you drink alcohol : Limit how much you have to: 0-1 drink a day for women. 0-2 drinks a day for men. Know how much alcohol  is in your drink. In the [place] one drink equals one 12 oz bottle of beer (355 mL), one 5 oz glass of wine (148 mL), or one 1 oz glass of hard liquor (44 mL). Do not use any products that contain nicotine or tobacco. These products include cigarettes, chewing tobacco, and vaping devices, such as e-cigarettes. If you need help quitting, ask your health care provider. Activity  Follow a regular exercise program to stay fit. This will help you maintain your balance. Ask your health care provider what types of exercise are appropriate for you. If you need a cane or walker, use it as recommended by your health care provider. Wear supportive shoes that have nonskid soles. Safety  Remove any tripping hazards, such as rugs, cords, and clutter. Install safety equipment such as grab bars in bathrooms and safety rails on stairs. Keep rooms and walkways  well-lit. General instructions Talk with your health care provider about your risks for falling. Tell your health care provider if: You fall. Be sure to tell your health care provider about all falls, even ones that seem minor. You feel dizzy, tiredness (fatigue), or off-balance. Take over-the-counter and prescription medicines only as told by your health care provider. These include  supplements. Eat a healthy diet and maintain a healthy weight. A healthy diet includes low-fat dairy products, low-fat (lean) meats, and fiber from whole grains, beans, and lots of fruits and vegetables. Stay current with your vaccines. Schedule regular health, dental, and eye exams. Summary Having a healthy lifestyle and getting preventive care can help to protect your health and wellness after age 15. Screening and testing are the best way to find a health problem early and help you avoid having a fall. Early diagnosis and treatment give you the best chance for managing medical conditions that are more common for people who are older than age 42. Falls are a major cause of broken bones and head injuries in people who are older than age 64. Take precautions to prevent a fall at home. Work with your health care provider to learn what changes you can make to improve your health and wellness and to prevent falls. This information is not intended to replace advice given to you by your health care provider. Make sure you discuss any questions you have with your health care provider. Document Revised: 01/07/2021 Document Reviewed: 01/07/2021 Elsevier Patient Education  2024 [employer].

## 2024-09-20 ENCOUNTER — Encounter

## 2024-09-20 DIAGNOSIS — R4189 Other symptoms and signs involving cognitive functions and awareness: Secondary | ICD-10-CM

## 2024-09-20 NOTE — Progress Notes (Unsigned)
 "  Mental Status/Behavioral Observations (09/20/2024):  Orientation: The patient was oriented to self, place, and time.  Sensory/Arousal: Hearing was adequate for the demands of testing. Vision was adequate with the assistance of corrective lenses. The patient was alert. Appearance: Dress and hygiene were appropriate for the setting.  Speech/Language: In conversation, the patient's speech was prosodic, fluent, and well-articulated. The patient displayed no indications of word finding difficulties and no word substitution errors were observed.  Motor: The patient ambulated independently and without issue. No tremors were observed.  Social Comportment: Social behavior was appropriate for the setting. Mood/Affect: Mood was largely neutral to positive. Affect was consistent with mood. The patient reported some mild anxiety throughout the test. Attention/Concentration:  The patient appeared to maintain consistent engagement throughout the testing session. No frank attentional lapses were observed.  Thought Process/Content: The patient's thought process was coherent, linear, goal directed. There were no indications of psychosis.  Additional Observations:  The patient showed no difficulties with understanding task instructions. Minimal difficulties with frustration tolerance were noted.  Neuropsychology Note Gregg Smith. completed 95 minutes of neuropsychological testing with technician, Josue Ned, BA, under the supervision of Evalene Riff, PsyD., Clinical Neuropsychologist. The patient did not appear overtly distressed by the testing session, per behavioral observation or via self-report to the technician. Rest breaks were offered.   Clinical Decision Making: In considering the patient's current level of functioning, level of presumed impairment, nature of symptoms, emotional and behavioral responses during clinical interview, level of literacy, and observed level of motivation/effort, a  battery of tests was selected by Dr. Riff during initial consultation on 09/05/2024. This was communicated to the technician. Communication between the neuropsychologist and technician was ongoing throughout the testing session and changes were made as deemed necessary based on patient performance on testing, technician observations and additional pertinent factors such as those listed above.  Tests Administered: Automatic Data Edition (BNT-2) Brief Visuospatial Memory Test-Revised (BVMT-R) Clock Drawing Test Controlled Oral Word Association Test (FAS & Animals) Delis-Kaplan Executive Function System (D-KEFS), select subtests Hopkins Verbal Learning Test - Revised (HVLT-R) Repeatable Battery for the Assessment of Neuropsychological Status Update (RBANS) Trail Making Test (TMT; Part A & B) Wechsler Adult Intelligence Scale-Fourth Edition (WAIS-IV), select subtests Wechsler Memory Scale-Fourth Edition (WMS-IV) , select subtests Wechsler Memory Scale-Third Edition (WMS-III), select subtests  Wechsler Test of Adult Reading (WTAR) Geriatric Depression Scale-Short Form (GDS-SF) Geriatric Anxiety Inventory (GAI)  NEUROPSYCHOLOGICAL TEST RESULTS: Note: This summary of test scores accompanies the interpretive report and should not be interpreted by unqualified individuals or in isolation without reference to the report.   Test scores are relative to age and further adjusted for educational history and demographics as available when appropriate.  Measurement properties of test scores: IQ, Index, and Standard Scores (SS): Mean = 100; Standard Deviation = 15; Scaled Scores (ss): Mean = 10; Standard Deviation = 3; Z scores (Z): Mean = 0; Standard Deviation = 1; T scores (T); Mean = 50; Standard Deviation = 10  Intellectual/Premorbid Functioning Estimate   Norm Score Percentile  Range  Wechsler Test of Adult Reading  SS = 123 94 %ile Above Average   ATTENTION AND WORKING MEMORY    Norm Score  Percentile  Range  WAIS-IV          Digit Span  ss = 10 50 %ile Average   DSF  ss = 9 37 %ile Average   Span:    6      DSB  ss =  10 50 %ile Average   Span:    4      DSS  ss = 10 50 %ile Average   Span:    6     WMS-III          Spatial Span  ss = 8 25 %ile Average   SSF  ss = 8 25 %ile Average   Span:    4      SSB  ss = 9 37 %ile Average   Span:    4      PROCESSING SPEED    Norm Score Percentile  Range  WAIS-IV          Coding  ss = 13 84 %ile High Average   Symbol Search  ss = 13 84 %ile High Average   LANGUAGE    Norm Score Percentile  Range  Boston Naming Test (BNT-2)  T = 53 61 %ile Average  COWAT          FAS  T = 54 66 %ile Average   Animals  T = 68 96 %ile Above Average   EXECUTIVE FUNCTIONING    Norm Score Percentile  Range  DKEFS - Color-Word Interference          Color Naming  ss = 11 63 %ile Average   Word Reading  ss = 11 63 %ile Average   Inhibition  ss = 13 84 %ile High Average   Errors  ss = 13 84 %ile High Average   Inhibition Switching  ss = 14 91 %ile Above Average   Errors  ss = 12 75 %ile High Average  TMT A  T = 34 5 %ile Below Average  TMT B  T = 42 21 %ile Low Average   MEMORY    Norm Score Percentile  Range  BVMT-R          Trial 1  T = 52.0 58 %ile Average   Trial 2  T = 45 30 %ile Average   Trial 3  T = 53.0 61 %ile Average   Total Recall  T = 50.0 50 %ile Average   Learning  T = 51.0 53 %ile Average   Delayed Recall  T = 55.0 68 %ile Average   % Retained    100 >16 %ile WNL   Hits     >16 %ile WNL   False Alarms     >16 %ile WNL   Recognition Discriminability     >16 %ile WNL  HVLT          Total Recall  T = 57.0 75 %ile High Average   Delayed Recall  T = 60 84 %ile High Average   %Retention  T = 61.0 86 %ile High Average   Recognition Discriminability  T = 54.0 66 %ile Average  Wechsler Memory Scale, 4th Edition (WMS-4)         Log. Mem. Immediate Recall  ss = 13 84 %ile High Average   Logical Memory Delayed Recall  ss = 11 63  %ile Average   Logical Recognition    26th-50th  %ile Average   VISUAL-SPATIAL    Norm Score Percentile  Range  Clock                   RBANS Visuospatial Index          RBANS Figure Copy  ss = 7 16 %ile Low Average   RBANS Line Orientation  26th-50th  %ile Average   PERSONALITY AND BEHAVIORAL FUNCTIONING      Score/Interpretation  GDS-SF Raw       4  GDS-SF Severity       Minimal.  GAI Raw       1  GAI Severity       Minimal.    Feedback to Patient: Gregg Smith. will return on 09/30/2024 for an interactive feedback session with Dr. Hayden at which time his test performances, clinical impressions and treatment recommendations will be reviewed in detail. The patient understands he can contact our office should he require our assistance before this time.  95 minutes spent face-to-face with patient administering standardized tests, 55 minutes spent scoring radiographer, therapeutic). [CPT [id number], [id number]]  Full report to follow. "

## 2024-09-26 ENCOUNTER — Encounter: Admitting: Psychology

## 2024-09-29 ENCOUNTER — Encounter: Admitting: Psychology

## 2024-09-30 ENCOUNTER — Encounter: Admitting: Psychology

## 2024-10-03 ENCOUNTER — Ambulatory Visit: Admitting: Gastroenterology

## 2024-10-24 ENCOUNTER — Ambulatory Visit

## 2024-10-27 ENCOUNTER — Ambulatory Visit: Admitting: Gastroenterology

## 2024-11-02 ENCOUNTER — Ambulatory Visit: Admitting: Physical Therapy

## 2024-11-17 ENCOUNTER — Ambulatory Visit: Admitting: Pulmonary Disease

## 2024-12-20 ENCOUNTER — Ambulatory Visit: Admitting: Neurology

## 2025-03-23 ENCOUNTER — Ambulatory Visit
